# Patient Record
Sex: Female | Born: 1980
Health system: Southern US, Community
[De-identification: ages and names within clinical notes are randomized; demographics above are authoritative.]

## PROBLEM LIST (undated history)

## (undated) DIAGNOSIS — I10 Essential (primary) hypertension: Secondary | ICD-10-CM

## (undated) DIAGNOSIS — R0602 Shortness of breath: Secondary | ICD-10-CM

## (undated) DIAGNOSIS — K219 Gastro-esophageal reflux disease without esophagitis: Secondary | ICD-10-CM

## (undated) DIAGNOSIS — R51 Headache: Secondary | ICD-10-CM

## (undated) DIAGNOSIS — M1990 Unspecified osteoarthritis, unspecified site: Secondary | ICD-10-CM

## (undated) DIAGNOSIS — Z720 Tobacco use: Secondary | ICD-10-CM

## (undated) DIAGNOSIS — E109 Type 1 diabetes mellitus without complications: Secondary | ICD-10-CM

## (undated) DIAGNOSIS — J42 Unspecified chronic bronchitis: Secondary | ICD-10-CM

## (undated) DIAGNOSIS — E785 Hyperlipidemia, unspecified: Secondary | ICD-10-CM

## (undated) DIAGNOSIS — N289 Disorder of kidney and ureter, unspecified: Secondary | ICD-10-CM

## (undated) HISTORY — DX: Hyperlipidemia, unspecified: E78.5

## (undated) HISTORY — DX: Tobacco use: Z72.0

## (undated) HISTORY — DX: Imprisonment and other incarceration: Z65.1

## (undated) HISTORY — DX: Essential (primary) hypertension: I10

---
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
## 2000-01-14 ENCOUNTER — Emergency Department (HOSPITAL_COMMUNITY): Admission: EM | Admit: 2000-01-14 | Discharge: 2000-01-14 | Payer: Self-pay | Admitting: Emergency Medicine

## 2000-01-26 ENCOUNTER — Encounter: Admission: RE | Admit: 2000-01-26 | Discharge: 2000-01-26 | Payer: Self-pay | Admitting: Family Medicine

## 2000-02-09 ENCOUNTER — Emergency Department (HOSPITAL_COMMUNITY): Admission: EM | Admit: 2000-02-09 | Discharge: 2000-02-10 | Payer: Self-pay | Admitting: Emergency Medicine

## 2000-03-09 ENCOUNTER — Inpatient Hospital Stay (HOSPITAL_COMMUNITY): Admission: AD | Admit: 2000-03-09 | Discharge: 2000-03-09 | Payer: Self-pay | Admitting: Obstetrics & Gynecology

## 2000-03-12 ENCOUNTER — Encounter: Admission: RE | Admit: 2000-03-12 | Discharge: 2000-03-12 | Payer: Self-pay | Admitting: Family Medicine

## 2000-03-15 ENCOUNTER — Inpatient Hospital Stay (HOSPITAL_COMMUNITY): Admission: AD | Admit: 2000-03-15 | Discharge: 2000-03-19 | Payer: Self-pay | Admitting: Obstetrics & Gynecology

## 2000-03-17 ENCOUNTER — Encounter: Payer: Self-pay | Admitting: Obstetrics & Gynecology

## 2000-03-21 ENCOUNTER — Encounter: Admission: RE | Admit: 2000-03-21 | Discharge: 2000-03-21 | Payer: Self-pay | Admitting: Obstetrics & Gynecology

## 2000-03-21 ENCOUNTER — Encounter: Admission: RE | Admit: 2000-03-21 | Discharge: 2000-06-19 | Payer: Self-pay | Admitting: Obstetrics & Gynecology

## 2000-03-28 ENCOUNTER — Encounter: Admission: RE | Admit: 2000-03-28 | Discharge: 2000-03-28 | Payer: Self-pay | Admitting: Obstetrics & Gynecology

## 2000-04-04 ENCOUNTER — Ambulatory Visit (HOSPITAL_COMMUNITY): Admission: RE | Admit: 2000-04-04 | Discharge: 2000-04-04 | Payer: Self-pay | Admitting: Obstetrics & Gynecology

## 2000-04-04 ENCOUNTER — Encounter: Admission: RE | Admit: 2000-04-04 | Discharge: 2000-04-04 | Payer: Self-pay | Admitting: Obstetrics & Gynecology

## 2000-04-09 ENCOUNTER — Ambulatory Visit (HOSPITAL_COMMUNITY): Admission: RE | Admit: 2000-04-09 | Discharge: 2000-04-09 | Payer: Self-pay | Admitting: *Deleted

## 2000-04-25 ENCOUNTER — Encounter: Admission: RE | Admit: 2000-04-25 | Discharge: 2000-04-25 | Payer: Self-pay | Admitting: Obstetrics & Gynecology

## 2000-05-02 ENCOUNTER — Encounter: Admission: RE | Admit: 2000-05-02 | Discharge: 2000-05-02 | Payer: Self-pay | Admitting: Obstetrics & Gynecology

## 2000-05-05 ENCOUNTER — Inpatient Hospital Stay (HOSPITAL_COMMUNITY): Admission: AD | Admit: 2000-05-05 | Discharge: 2000-05-05 | Payer: Self-pay | Admitting: *Deleted

## 2000-05-09 ENCOUNTER — Encounter: Admission: RE | Admit: 2000-05-09 | Discharge: 2000-05-09 | Payer: Self-pay | Admitting: Obstetrics & Gynecology

## 2000-05-16 ENCOUNTER — Encounter: Admission: RE | Admit: 2000-05-16 | Discharge: 2000-05-16 | Payer: Self-pay | Admitting: Obstetrics & Gynecology

## 2000-05-17 ENCOUNTER — Ambulatory Visit (HOSPITAL_COMMUNITY): Admission: RE | Admit: 2000-05-17 | Discharge: 2000-05-17 | Payer: Self-pay | Admitting: Obstetrics & Gynecology

## 2000-05-30 ENCOUNTER — Encounter: Admission: RE | Admit: 2000-05-30 | Discharge: 2000-05-30 | Payer: Self-pay | Admitting: Obstetrics & Gynecology

## 2000-06-06 ENCOUNTER — Encounter: Admission: RE | Admit: 2000-06-06 | Discharge: 2000-06-06 | Payer: Self-pay | Admitting: Obstetrics

## 2000-06-07 ENCOUNTER — Inpatient Hospital Stay (HOSPITAL_COMMUNITY): Admission: AD | Admit: 2000-06-07 | Discharge: 2000-06-10 | Payer: Self-pay | Admitting: *Deleted

## 2000-06-19 ENCOUNTER — Ambulatory Visit (HOSPITAL_COMMUNITY): Admission: RE | Admit: 2000-06-19 | Discharge: 2000-06-19 | Payer: Self-pay | Admitting: *Deleted

## 2000-06-19 ENCOUNTER — Encounter: Payer: Self-pay | Admitting: *Deleted

## 2000-06-20 ENCOUNTER — Encounter: Admission: RE | Admit: 2000-06-20 | Discharge: 2000-06-20 | Payer: Self-pay | Admitting: Obstetrics & Gynecology

## 2000-06-27 ENCOUNTER — Encounter: Admission: RE | Admit: 2000-06-27 | Discharge: 2000-06-27 | Payer: Self-pay | Admitting: Obstetrics & Gynecology

## 2000-07-17 ENCOUNTER — Ambulatory Visit (HOSPITAL_COMMUNITY): Admission: RE | Admit: 2000-07-17 | Discharge: 2000-07-17 | Payer: Self-pay | Admitting: *Deleted

## 2000-07-17 ENCOUNTER — Encounter: Payer: Self-pay | Admitting: *Deleted

## 2000-07-18 ENCOUNTER — Encounter: Admission: RE | Admit: 2000-07-18 | Discharge: 2000-07-18 | Payer: Self-pay | Admitting: Obstetrics & Gynecology

## 2000-07-25 ENCOUNTER — Encounter: Admission: RE | Admit: 2000-07-25 | Discharge: 2000-07-25 | Payer: Self-pay | Admitting: Obstetrics & Gynecology

## 2000-07-31 ENCOUNTER — Ambulatory Visit (HOSPITAL_COMMUNITY): Admission: RE | Admit: 2000-07-31 | Discharge: 2000-07-31 | Payer: Self-pay | Admitting: Obstetrics & Gynecology

## 2000-08-07 ENCOUNTER — Ambulatory Visit (HOSPITAL_COMMUNITY): Admission: RE | Admit: 2000-08-07 | Discharge: 2000-08-07 | Payer: Self-pay | Admitting: Obstetrics & Gynecology

## 2000-08-08 ENCOUNTER — Encounter: Admission: RE | Admit: 2000-08-08 | Discharge: 2000-08-08 | Payer: Self-pay | Admitting: Obstetrics & Gynecology

## 2000-08-08 ENCOUNTER — Encounter: Admission: RE | Admit: 2000-08-08 | Discharge: 2000-08-25 | Payer: Self-pay | Admitting: Obstetrics & Gynecology

## 2000-08-22 ENCOUNTER — Encounter: Admission: RE | Admit: 2000-08-22 | Discharge: 2000-08-22 | Payer: Self-pay | Admitting: Obstetrics & Gynecology

## 2000-08-22 ENCOUNTER — Inpatient Hospital Stay (HOSPITAL_COMMUNITY): Admission: AD | Admit: 2000-08-22 | Discharge: 2000-08-31 | Payer: Self-pay | Admitting: Obstetrics

## 2000-08-22 ENCOUNTER — Encounter (INDEPENDENT_AMBULATORY_CARE_PROVIDER_SITE_OTHER): Payer: Self-pay | Admitting: Specialist

## 2000-09-04 ENCOUNTER — Encounter: Admission: RE | Admit: 2000-09-04 | Discharge: 2000-09-04 | Payer: Self-pay | Admitting: Family Medicine

## 2000-09-11 ENCOUNTER — Encounter: Admission: RE | Admit: 2000-09-11 | Discharge: 2000-09-11 | Payer: Self-pay | Admitting: Family Medicine

## 2000-09-25 ENCOUNTER — Emergency Department (HOSPITAL_COMMUNITY): Admission: EM | Admit: 2000-09-25 | Discharge: 2000-09-25 | Payer: Self-pay

## 2000-09-25 ENCOUNTER — Encounter: Payer: Self-pay | Admitting: *Deleted

## 2000-09-27 ENCOUNTER — Emergency Department (HOSPITAL_COMMUNITY): Admission: EM | Admit: 2000-09-27 | Discharge: 2000-09-27 | Payer: Self-pay

## 2000-10-11 ENCOUNTER — Encounter: Admission: RE | Admit: 2000-10-11 | Discharge: 2000-10-11 | Payer: Self-pay | Admitting: Family Medicine

## 2000-10-18 ENCOUNTER — Encounter: Admission: RE | Admit: 2000-10-18 | Discharge: 2000-10-18 | Payer: Self-pay | Admitting: Family Medicine

## 2000-12-05 ENCOUNTER — Encounter: Admission: RE | Admit: 2000-12-05 | Discharge: 2000-12-05 | Payer: Self-pay | Admitting: Family Medicine

## 2000-12-17 ENCOUNTER — Encounter: Admission: RE | Admit: 2000-12-17 | Discharge: 2000-12-17 | Payer: Self-pay | Admitting: Family Medicine

## 2001-01-09 ENCOUNTER — Encounter: Admission: RE | Admit: 2001-01-09 | Discharge: 2001-01-09 | Payer: Self-pay | Admitting: Family Medicine

## 2001-03-27 ENCOUNTER — Encounter: Admission: RE | Admit: 2001-03-27 | Discharge: 2001-03-27 | Payer: Self-pay | Admitting: Family Medicine

## 2001-04-02 ENCOUNTER — Inpatient Hospital Stay (HOSPITAL_COMMUNITY): Admission: AD | Admit: 2001-04-02 | Discharge: 2001-04-02 | Payer: Self-pay | Admitting: *Deleted

## 2001-04-12 ENCOUNTER — Encounter: Admission: RE | Admit: 2001-04-12 | Discharge: 2001-04-12 | Payer: Self-pay | Admitting: Internal Medicine

## 2001-04-15 ENCOUNTER — Encounter: Admission: RE | Admit: 2001-04-15 | Discharge: 2001-04-15 | Payer: Self-pay | Admitting: Internal Medicine

## 2001-04-16 ENCOUNTER — Encounter: Admission: RE | Admit: 2001-04-16 | Discharge: 2001-04-16 | Payer: Self-pay | Admitting: Internal Medicine

## 2001-05-14 ENCOUNTER — Encounter: Admission: RE | Admit: 2001-05-14 | Discharge: 2001-05-14 | Payer: Self-pay

## 2001-07-10 ENCOUNTER — Encounter: Admission: RE | Admit: 2001-07-10 | Discharge: 2001-07-10 | Payer: Self-pay | Admitting: Family Medicine

## 2001-08-05 ENCOUNTER — Encounter: Admission: RE | Admit: 2001-08-05 | Discharge: 2001-08-05 | Payer: Self-pay | Admitting: Family Medicine

## 2001-10-03 ENCOUNTER — Emergency Department (HOSPITAL_COMMUNITY): Admission: EM | Admit: 2001-10-03 | Discharge: 2001-10-03 | Payer: Self-pay | Admitting: Emergency Medicine

## 2001-10-10 ENCOUNTER — Encounter: Admission: RE | Admit: 2001-10-10 | Discharge: 2001-10-10 | Payer: Self-pay | Admitting: Family Medicine

## 2001-11-05 ENCOUNTER — Encounter: Admission: RE | Admit: 2001-11-05 | Discharge: 2001-11-05 | Payer: Self-pay | Admitting: Family Medicine

## 2001-12-06 ENCOUNTER — Encounter: Admission: RE | Admit: 2001-12-06 | Discharge: 2001-12-06 | Payer: Self-pay | Admitting: Family Medicine

## 2002-01-08 ENCOUNTER — Encounter: Admission: RE | Admit: 2002-01-08 | Discharge: 2002-01-08 | Payer: Self-pay | Admitting: Family Medicine

## 2002-02-27 ENCOUNTER — Encounter: Admission: RE | Admit: 2002-02-27 | Discharge: 2002-02-27 | Payer: Self-pay | Admitting: Family Medicine

## 2002-04-18 ENCOUNTER — Other Ambulatory Visit: Admission: RE | Admit: 2002-04-18 | Discharge: 2002-04-18 | Payer: Self-pay | Admitting: Family Medicine

## 2002-04-18 ENCOUNTER — Encounter: Admission: RE | Admit: 2002-04-18 | Discharge: 2002-04-18 | Payer: Self-pay | Admitting: Family Medicine

## 2002-07-24 ENCOUNTER — Encounter: Admission: RE | Admit: 2002-07-24 | Discharge: 2002-07-24 | Payer: Self-pay | Admitting: Family Medicine

## 2002-08-25 ENCOUNTER — Encounter: Admission: RE | Admit: 2002-08-25 | Discharge: 2002-08-25 | Payer: Self-pay | Admitting: Sports Medicine

## 2002-09-12 ENCOUNTER — Encounter: Admission: RE | Admit: 2002-09-12 | Discharge: 2002-09-12 | Payer: Self-pay | Admitting: Family Medicine

## 2002-10-23 ENCOUNTER — Encounter: Admission: RE | Admit: 2002-10-23 | Discharge: 2002-10-23 | Payer: Self-pay | Admitting: Family Medicine

## 2002-11-07 ENCOUNTER — Encounter: Admission: RE | Admit: 2002-11-07 | Discharge: 2002-11-07 | Payer: Self-pay | Admitting: Family Medicine

## 2002-12-19 ENCOUNTER — Encounter: Admission: RE | Admit: 2002-12-19 | Discharge: 2002-12-19 | Payer: Self-pay | Admitting: Family Medicine

## 2002-12-24 ENCOUNTER — Encounter: Admission: RE | Admit: 2002-12-24 | Discharge: 2002-12-24 | Payer: Self-pay | Admitting: Family Medicine

## 2003-01-08 ENCOUNTER — Encounter: Admission: RE | Admit: 2003-01-08 | Discharge: 2003-01-08 | Payer: Self-pay | Admitting: *Deleted

## 2003-01-15 ENCOUNTER — Encounter: Admission: RE | Admit: 2003-01-15 | Discharge: 2003-01-15 | Payer: Self-pay | Admitting: *Deleted

## 2003-01-15 ENCOUNTER — Ambulatory Visit (HOSPITAL_COMMUNITY): Admission: RE | Admit: 2003-01-15 | Discharge: 2003-01-15 | Payer: Self-pay | Admitting: *Deleted

## 2003-01-29 ENCOUNTER — Encounter: Admission: RE | Admit: 2003-01-29 | Discharge: 2003-01-29 | Payer: Self-pay | Admitting: Family Medicine

## 2003-02-12 ENCOUNTER — Encounter: Admission: RE | Admit: 2003-02-12 | Discharge: 2003-02-12 | Payer: Self-pay | Admitting: *Deleted

## 2003-02-14 ENCOUNTER — Emergency Department (HOSPITAL_COMMUNITY): Admission: AD | Admit: 2003-02-14 | Discharge: 2003-02-14 | Payer: Self-pay | Admitting: Family Medicine

## 2003-02-17 ENCOUNTER — Ambulatory Visit (HOSPITAL_COMMUNITY): Admission: RE | Admit: 2003-02-17 | Discharge: 2003-02-17 | Payer: Self-pay | Admitting: Family Medicine

## 2003-03-19 ENCOUNTER — Encounter: Admission: RE | Admit: 2003-03-19 | Discharge: 2003-03-19 | Payer: Self-pay | Admitting: Family Medicine

## 2003-04-08 ENCOUNTER — Encounter: Admission: RE | Admit: 2003-04-08 | Discharge: 2003-04-08 | Payer: Self-pay | Admitting: *Deleted

## 2003-04-15 ENCOUNTER — Emergency Department (HOSPITAL_COMMUNITY): Admission: EM | Admit: 2003-04-15 | Discharge: 2003-04-15 | Payer: Self-pay | Admitting: Emergency Medicine

## 2003-04-17 ENCOUNTER — Inpatient Hospital Stay (HOSPITAL_COMMUNITY): Admission: AD | Admit: 2003-04-17 | Discharge: 2003-04-18 | Payer: Self-pay | Admitting: Obstetrics & Gynecology

## 2003-04-29 ENCOUNTER — Encounter: Admission: RE | Admit: 2003-04-29 | Discharge: 2003-04-29 | Payer: Self-pay | Admitting: *Deleted

## 2003-05-21 ENCOUNTER — Encounter: Admission: RE | Admit: 2003-05-21 | Discharge: 2003-05-21 | Payer: Self-pay | Admitting: *Deleted

## 2003-05-26 ENCOUNTER — Encounter: Admission: RE | Admit: 2003-05-26 | Discharge: 2003-05-26 | Payer: Self-pay | Admitting: *Deleted

## 2003-05-28 ENCOUNTER — Encounter: Admission: RE | Admit: 2003-05-28 | Discharge: 2003-05-28 | Payer: Self-pay | Admitting: *Deleted

## 2003-05-28 ENCOUNTER — Ambulatory Visit (HOSPITAL_COMMUNITY): Admission: RE | Admit: 2003-05-28 | Discharge: 2003-05-28 | Payer: Self-pay | Admitting: *Deleted

## 2003-06-04 ENCOUNTER — Ambulatory Visit (HOSPITAL_COMMUNITY): Admission: RE | Admit: 2003-06-04 | Discharge: 2003-06-04 | Payer: Self-pay | Admitting: *Deleted

## 2003-06-04 ENCOUNTER — Encounter: Admission: RE | Admit: 2003-06-04 | Discharge: 2003-06-04 | Payer: Self-pay | Admitting: Family Medicine

## 2003-06-11 ENCOUNTER — Encounter: Admission: RE | Admit: 2003-06-11 | Discharge: 2003-06-11 | Payer: Self-pay | Admitting: Family Medicine

## 2003-06-25 ENCOUNTER — Encounter: Admission: RE | Admit: 2003-06-25 | Discharge: 2003-06-25 | Payer: Self-pay | Admitting: *Deleted

## 2003-06-25 ENCOUNTER — Inpatient Hospital Stay (HOSPITAL_COMMUNITY): Admission: AD | Admit: 2003-06-25 | Discharge: 2003-06-25 | Payer: Self-pay | Admitting: Obstetrics & Gynecology

## 2003-06-26 ENCOUNTER — Inpatient Hospital Stay (HOSPITAL_COMMUNITY): Admission: AD | Admit: 2003-06-26 | Discharge: 2003-06-26 | Payer: Self-pay | Admitting: Family Medicine

## 2003-06-28 ENCOUNTER — Encounter (INDEPENDENT_AMBULATORY_CARE_PROVIDER_SITE_OTHER): Payer: Self-pay | Admitting: Specialist

## 2003-06-28 ENCOUNTER — Inpatient Hospital Stay (HOSPITAL_COMMUNITY): Admission: AD | Admit: 2003-06-28 | Discharge: 2003-07-01 | Payer: Self-pay | Admitting: Family Medicine

## 2003-07-05 ENCOUNTER — Inpatient Hospital Stay (HOSPITAL_COMMUNITY): Admission: AD | Admit: 2003-07-05 | Discharge: 2003-07-05 | Payer: Self-pay | Admitting: *Deleted

## 2003-07-21 ENCOUNTER — Encounter: Admission: RE | Admit: 2003-07-21 | Discharge: 2003-07-21 | Payer: Self-pay | Admitting: Family Medicine

## 2003-08-13 ENCOUNTER — Encounter: Admission: RE | Admit: 2003-08-13 | Discharge: 2003-08-13 | Payer: Self-pay | Admitting: Obstetrics and Gynecology

## 2003-09-26 ENCOUNTER — Emergency Department (HOSPITAL_COMMUNITY): Admission: EM | Admit: 2003-09-26 | Discharge: 2003-09-26 | Payer: Self-pay | Admitting: Emergency Medicine

## 2004-03-07 ENCOUNTER — Other Ambulatory Visit: Admission: RE | Admit: 2004-03-07 | Discharge: 2004-03-07 | Payer: Self-pay | Admitting: Family Medicine

## 2004-03-07 ENCOUNTER — Ambulatory Visit: Payer: Self-pay | Admitting: Family Medicine

## 2004-03-15 ENCOUNTER — Encounter: Admission: RE | Admit: 2004-03-15 | Discharge: 2004-03-15 | Payer: Self-pay | Admitting: *Deleted

## 2004-03-31 ENCOUNTER — Ambulatory Visit: Payer: Self-pay | Admitting: Family Medicine

## 2004-04-28 ENCOUNTER — Ambulatory Visit: Payer: Self-pay | Admitting: Family Medicine

## 2004-06-09 ENCOUNTER — Ambulatory Visit: Payer: Self-pay | Admitting: Family Medicine

## 2004-07-05 ENCOUNTER — Ambulatory Visit: Payer: Self-pay | Admitting: Family Medicine

## 2004-07-20 ENCOUNTER — Ambulatory Visit: Payer: Self-pay | Admitting: Family Medicine

## 2004-08-25 ENCOUNTER — Ambulatory Visit: Payer: Self-pay | Admitting: Family Medicine

## 2004-09-28 ENCOUNTER — Ambulatory Visit: Payer: Self-pay | Admitting: Family Medicine

## 2004-10-07 ENCOUNTER — Ambulatory Visit: Payer: Self-pay | Admitting: Family Medicine

## 2004-10-28 ENCOUNTER — Ambulatory Visit: Payer: Self-pay | Admitting: Family Medicine

## 2005-01-17 ENCOUNTER — Ambulatory Visit: Payer: Self-pay | Admitting: Family Medicine

## 2005-01-17 ENCOUNTER — Inpatient Hospital Stay (HOSPITAL_COMMUNITY): Admission: EM | Admit: 2005-01-17 | Discharge: 2005-01-19 | Payer: Self-pay | Admitting: Family Medicine

## 2005-01-17 ENCOUNTER — Emergency Department (HOSPITAL_COMMUNITY): Admission: EM | Admit: 2005-01-17 | Discharge: 2005-01-17 | Payer: Self-pay | Admitting: Emergency Medicine

## 2005-02-13 ENCOUNTER — Ambulatory Visit: Payer: Self-pay | Admitting: Sports Medicine

## 2005-03-23 ENCOUNTER — Ambulatory Visit: Payer: Self-pay | Admitting: Family Medicine

## 2005-04-30 ENCOUNTER — Encounter (INDEPENDENT_AMBULATORY_CARE_PROVIDER_SITE_OTHER): Payer: Self-pay | Admitting: *Deleted

## 2005-04-30 LAB — CONVERTED CEMR LAB

## 2005-05-22 ENCOUNTER — Ambulatory Visit: Payer: Self-pay | Admitting: Family Medicine

## 2005-08-07 ENCOUNTER — Inpatient Hospital Stay (HOSPITAL_COMMUNITY): Admission: EM | Admit: 2005-08-07 | Discharge: 2005-08-10 | Payer: Self-pay | Admitting: *Deleted

## 2005-08-07 ENCOUNTER — Ambulatory Visit: Payer: Self-pay | Admitting: Family Medicine

## 2005-12-05 ENCOUNTER — Ambulatory Visit: Payer: Self-pay | Admitting: Family Medicine

## 2006-03-29 DIAGNOSIS — E7800 Pure hypercholesterolemia, unspecified: Secondary | ICD-10-CM | POA: Insufficient documentation

## 2006-03-30 ENCOUNTER — Encounter (INDEPENDENT_AMBULATORY_CARE_PROVIDER_SITE_OTHER): Payer: Self-pay | Admitting: *Deleted

## 2006-04-02 ENCOUNTER — Encounter: Payer: Self-pay | Admitting: Family Medicine

## 2006-04-02 ENCOUNTER — Ambulatory Visit: Payer: Self-pay | Admitting: Sports Medicine

## 2006-04-02 DIAGNOSIS — E103599 Type 1 diabetes mellitus with proliferative diabetic retinopathy without macular edema, unspecified eye: Secondary | ICD-10-CM | POA: Insufficient documentation

## 2006-04-02 LAB — CONVERTED CEMR LAB
ALT: 19 units/L (ref 0–35)
AST: 20 units/L (ref 0–37)
Albumin: 3.9 g/dL (ref 3.5–5.2)
Alkaline Phosphatase: 94 units/L (ref 39–117)
BUN: 9 mg/dL (ref 6–23)
CO2: 25 meq/L (ref 19–32)
Calcium: 9.2 mg/dL (ref 8.4–10.5)
Chloride: 90 meq/L — ABNORMAL LOW (ref 96–112)
Creatinine, Ser: 0.84 mg/dL (ref 0.40–1.20)
Glucose, Bld: 536 mg/dL (ref 70–99)
Potassium: 4.1 meq/L (ref 3.5–5.3)
Sodium: 127 meq/L — ABNORMAL LOW (ref 135–145)
Total Bilirubin: 0.5 mg/dL (ref 0.3–1.2)
Total Protein: 7.4 g/dL (ref 6.0–8.3)

## 2006-04-03 ENCOUNTER — Telehealth: Payer: Self-pay | Admitting: Family Medicine

## 2006-04-06 ENCOUNTER — Inpatient Hospital Stay (HOSPITAL_COMMUNITY): Admission: EM | Admit: 2006-04-06 | Discharge: 2006-04-08 | Payer: Self-pay | Admitting: Emergency Medicine

## 2006-04-06 ENCOUNTER — Ambulatory Visit: Payer: Self-pay | Admitting: Internal Medicine

## 2006-06-04 ENCOUNTER — Ambulatory Visit: Payer: Self-pay | Admitting: Family Medicine

## 2006-06-18 ENCOUNTER — Ambulatory Visit: Payer: Self-pay | Admitting: Sports Medicine

## 2006-06-20 ENCOUNTER — Ambulatory Visit: Payer: Self-pay | Admitting: *Deleted

## 2006-09-06 ENCOUNTER — Encounter (INDEPENDENT_AMBULATORY_CARE_PROVIDER_SITE_OTHER): Payer: Self-pay | Admitting: Family Medicine

## 2006-09-18 ENCOUNTER — Telehealth: Payer: Self-pay | Admitting: *Deleted

## 2006-09-25 ENCOUNTER — Encounter: Payer: Self-pay | Admitting: *Deleted

## 2006-09-25 ENCOUNTER — Telehealth (INDEPENDENT_AMBULATORY_CARE_PROVIDER_SITE_OTHER): Payer: Self-pay | Admitting: *Deleted

## 2006-10-18 ENCOUNTER — Other Ambulatory Visit: Admission: RE | Admit: 2006-10-18 | Discharge: 2006-10-18 | Payer: Self-pay | Admitting: Family Medicine

## 2006-10-18 ENCOUNTER — Encounter (INDEPENDENT_AMBULATORY_CARE_PROVIDER_SITE_OTHER): Payer: Self-pay | Admitting: Family Medicine

## 2006-10-18 ENCOUNTER — Ambulatory Visit: Payer: Self-pay | Admitting: Family Medicine

## 2006-10-18 LAB — CONVERTED CEMR LAB
Hgb A1c MFr Bld: >14 %
KOH Prep: NEGATIVE
Whiff Test: POSITIVE

## 2006-11-05 ENCOUNTER — Encounter (INDEPENDENT_AMBULATORY_CARE_PROVIDER_SITE_OTHER): Payer: Self-pay | Admitting: Family Medicine

## 2006-11-05 ENCOUNTER — Encounter: Payer: Self-pay | Admitting: *Deleted

## 2006-11-16 ENCOUNTER — Inpatient Hospital Stay (HOSPITAL_COMMUNITY): Admission: AD | Admit: 2006-11-16 | Discharge: 2006-11-16 | Payer: Self-pay | Admitting: Obstetrics and Gynecology

## 2006-11-19 ENCOUNTER — Encounter (INDEPENDENT_AMBULATORY_CARE_PROVIDER_SITE_OTHER): Payer: Self-pay | Admitting: *Deleted

## 2007-01-27 ENCOUNTER — Emergency Department (HOSPITAL_COMMUNITY): Admission: EM | Admit: 2007-01-27 | Discharge: 2007-01-27 | Payer: Self-pay | Admitting: Emergency Medicine

## 2007-01-31 HISTORY — PX: TUBAL LIGATION: SHX77

## 2007-02-27 ENCOUNTER — Telehealth: Payer: Self-pay | Admitting: *Deleted

## 2007-02-28 ENCOUNTER — Encounter: Payer: Self-pay | Admitting: *Deleted

## 2007-05-07 ENCOUNTER — Ambulatory Visit: Payer: Self-pay | Admitting: Family Medicine

## 2007-05-07 ENCOUNTER — Encounter (INDEPENDENT_AMBULATORY_CARE_PROVIDER_SITE_OTHER): Payer: Self-pay | Admitting: *Deleted

## 2007-05-07 LAB — CONVERTED CEMR LAB
Beta hcg, urine, semiquantitative: NEGATIVE
Bilirubin Urine: NEGATIVE
Blood in Urine, dipstick: NEGATIVE
Glucose, Urine, Semiquant: >=1000
Ketones, urine, test strip: NEGATIVE
Nitrite: NEGATIVE
Protein, U semiquant: NEGATIVE
Specific Gravity, Urine: <1.005
Urobilinogen, UA: 0.2
WBC Urine, dipstick: NEGATIVE
Whiff Test: POSITIVE
pH: 6

## 2007-05-08 ENCOUNTER — Ambulatory Visit: Payer: Self-pay | Admitting: Family Medicine

## 2007-05-08 LAB — CONVERTED CEMR LAB
Chlamydia, DNA Probe: NEGATIVE
GC Probe Amp, Genital: POSITIVE — AB

## 2007-05-28 ENCOUNTER — Encounter: Payer: Self-pay | Admitting: *Deleted

## 2007-05-28 ENCOUNTER — Encounter (INDEPENDENT_AMBULATORY_CARE_PROVIDER_SITE_OTHER): Payer: Self-pay | Admitting: Family Medicine

## 2007-05-28 ENCOUNTER — Ambulatory Visit: Payer: Self-pay | Admitting: Family Medicine

## 2007-05-28 LAB — CONVERTED CEMR LAB: Whiff Test: POSITIVE

## 2007-05-30 ENCOUNTER — Encounter: Payer: Self-pay | Admitting: *Deleted

## 2007-05-30 LAB — CONVERTED CEMR LAB: Chlamydia, DNA Probe: NEGATIVE

## 2007-06-17 ENCOUNTER — Encounter (INDEPENDENT_AMBULATORY_CARE_PROVIDER_SITE_OTHER): Payer: Self-pay | Admitting: Family Medicine

## 2007-06-17 ENCOUNTER — Ambulatory Visit: Payer: Self-pay | Admitting: Family Medicine

## 2007-06-17 LAB — CONVERTED CEMR LAB
Bilirubin Urine: NEGATIVE
Glucose, Urine, Semiquant: >=1000
Urobilinogen, UA: 0.2
pH: 6

## 2007-06-20 ENCOUNTER — Emergency Department (HOSPITAL_COMMUNITY): Admission: EM | Admit: 2007-06-20 | Discharge: 2007-06-20 | Payer: Self-pay | Admitting: Emergency Medicine

## 2007-07-02 ENCOUNTER — Telehealth: Payer: Self-pay | Admitting: *Deleted

## 2007-07-04 ENCOUNTER — Encounter (INDEPENDENT_AMBULATORY_CARE_PROVIDER_SITE_OTHER): Payer: Self-pay | Admitting: Family Medicine

## 2007-07-04 ENCOUNTER — Ambulatory Visit: Payer: Self-pay | Admitting: Family Medicine

## 2007-07-04 LAB — CONVERTED CEMR LAB
CO2: 25 meq/L (ref 19–32)
Calcium: 8.9 mg/dL (ref 8.4–10.5)
Chloride: 94 meq/L — ABNORMAL LOW (ref 96–112)
Creatinine, Ser: 0.86 mg/dL (ref 0.40–1.20)
Eosinophils Relative: 0 % (ref 0–5)
Glucose, Bld: 385 mg/dL — ABNORMAL HIGH (ref 70–99)
Glucose, Urine, Semiquant: >=1000
HCT: 38.2 % (ref 36.0–46.0)
Hemoglobin: 12.8 g/dL (ref 12.0–15.0)
Lymphocytes Relative: 17 % (ref 12–46)
Lymphs Abs: 1.4 10*3/uL (ref 0.7–4.0)
Monocytes Absolute: 1 10*3/uL (ref 0.1–1.0)
Neutro Abs: 5.7 10*3/uL (ref 1.7–7.7)
Nitrite: NEGATIVE
Protein, U semiquant: 30
RBC: 4.28 M/uL (ref 3.87–5.11)
Urobilinogen, UA: 0.2
WBC: 8.1 10*3/uL (ref 4.0–10.5)
pH: 6.5

## 2007-07-12 ENCOUNTER — Telehealth (INDEPENDENT_AMBULATORY_CARE_PROVIDER_SITE_OTHER): Payer: Self-pay | Admitting: Family Medicine

## 2007-07-12 ENCOUNTER — Encounter: Admission: RE | Admit: 2007-07-12 | Discharge: 2007-07-12 | Payer: Self-pay | Admitting: Family Medicine

## 2007-07-16 ENCOUNTER — Encounter (INDEPENDENT_AMBULATORY_CARE_PROVIDER_SITE_OTHER): Payer: Self-pay | Admitting: *Deleted

## 2007-07-16 ENCOUNTER — Telehealth (INDEPENDENT_AMBULATORY_CARE_PROVIDER_SITE_OTHER): Payer: Self-pay | Admitting: *Deleted

## 2007-07-16 ENCOUNTER — Encounter (INDEPENDENT_AMBULATORY_CARE_PROVIDER_SITE_OTHER): Payer: Self-pay | Admitting: Family Medicine

## 2007-07-17 ENCOUNTER — Encounter (INDEPENDENT_AMBULATORY_CARE_PROVIDER_SITE_OTHER): Payer: Self-pay | Admitting: Family Medicine

## 2007-07-17 ENCOUNTER — Ambulatory Visit: Payer: Self-pay | Admitting: Family Medicine

## 2007-07-17 ENCOUNTER — Telehealth (INDEPENDENT_AMBULATORY_CARE_PROVIDER_SITE_OTHER): Payer: Self-pay | Admitting: *Deleted

## 2007-07-17 ENCOUNTER — Ambulatory Visit (HOSPITAL_COMMUNITY): Admission: RE | Admit: 2007-07-17 | Discharge: 2007-07-17 | Payer: Self-pay | Admitting: Family Medicine

## 2007-07-18 LAB — CONVERTED CEMR LAB
Basophils Relative: 1 % (ref 0–1)
Eosinophils Absolute: 0.1 10*3/uL (ref 0.0–0.7)
Eosinophils Relative: 3 % (ref 0–5)
HCT: 39.3 % (ref 36.0–46.0)
Lymphs Abs: 2.2 10*3/uL (ref 0.7–4.0)
MCHC: 33.1 g/dL (ref 30.0–36.0)
MCV: 88.7 fL (ref 78.0–100.0)
Monocytes Relative: 6 % (ref 3–12)
Neutrophils Relative %: 48 % (ref 43–77)
Platelets: 496 10*3/uL — ABNORMAL HIGH (ref 150–400)
RBC: 4.43 M/uL (ref 3.87–5.11)
WBC: 5.1 10*3/uL (ref 4.0–10.5)

## 2007-07-19 ENCOUNTER — Encounter (INDEPENDENT_AMBULATORY_CARE_PROVIDER_SITE_OTHER): Payer: Self-pay | Admitting: Family Medicine

## 2007-08-01 ENCOUNTER — Encounter: Payer: Self-pay | Admitting: *Deleted

## 2007-09-25 ENCOUNTER — Encounter: Payer: Self-pay | Admitting: *Deleted

## 2007-09-26 ENCOUNTER — Ambulatory Visit: Payer: Self-pay | Admitting: Family Medicine

## 2007-09-26 ENCOUNTER — Telehealth: Payer: Self-pay | Admitting: *Deleted

## 2007-09-27 ENCOUNTER — Encounter: Payer: Self-pay | Admitting: Family Medicine

## 2007-11-18 ENCOUNTER — Ambulatory Visit: Payer: Self-pay | Admitting: Family Medicine

## 2007-11-18 ENCOUNTER — Encounter (INDEPENDENT_AMBULATORY_CARE_PROVIDER_SITE_OTHER): Payer: Self-pay | Admitting: Family Medicine

## 2007-11-18 ENCOUNTER — Telehealth (INDEPENDENT_AMBULATORY_CARE_PROVIDER_SITE_OTHER): Payer: Self-pay | Admitting: *Deleted

## 2007-11-18 LAB — CONVERTED CEMR LAB
Blood in Urine, dipstick: NEGATIVE
Chlamydia, DNA Probe: NEGATIVE
GC Probe Amp, Genital: NEGATIVE
Glucose, Urine, Semiquant: 500
Nitrite: NEGATIVE
Protein, U semiquant: NEGATIVE
WBC Urine, dipstick: NEGATIVE
Whiff Test: POSITIVE
pH: 6

## 2007-11-19 ENCOUNTER — Encounter (INDEPENDENT_AMBULATORY_CARE_PROVIDER_SITE_OTHER): Payer: Self-pay | Admitting: Family Medicine

## 2008-01-20 ENCOUNTER — Encounter (INDEPENDENT_AMBULATORY_CARE_PROVIDER_SITE_OTHER): Payer: Self-pay | Admitting: Family Medicine

## 2008-01-20 ENCOUNTER — Ambulatory Visit: Payer: Self-pay | Admitting: Family Medicine

## 2008-01-20 ENCOUNTER — Other Ambulatory Visit: Admission: RE | Admit: 2008-01-20 | Discharge: 2008-01-20 | Payer: Self-pay | Admitting: Family Medicine

## 2008-01-20 LAB — CONVERTED CEMR LAB

## 2008-01-21 LAB — CONVERTED CEMR LAB
Albumin: 5 g/dL (ref 3.5–5.2)
Alkaline Phosphatase: 80 units/L (ref 39–117)
BUN: 9 mg/dL (ref 6–23)
CO2: 22 meq/L (ref 19–32)
Calcium: 10.3 mg/dL (ref 8.4–10.5)
Chloride: 101 meq/L (ref 96–112)
Cholesterol: 288 mg/dL — ABNORMAL HIGH (ref 0–200)
Glucose, Bld: 117 mg/dL — ABNORMAL HIGH (ref 70–99)
HDL: 105 mg/dL (ref >39)
LDL Cholesterol: 163 mg/dL — ABNORMAL HIGH (ref 0–99)
Potassium: 3.8 meq/L (ref 3.5–5.3)
Sodium: 143 meq/L (ref 135–145)
Total Protein: 9.2 g/dL — ABNORMAL HIGH (ref 6.0–8.3)
Triglycerides: 102 mg/dL (ref <150)

## 2008-02-26 ENCOUNTER — Telehealth: Payer: Self-pay | Admitting: *Deleted

## 2008-05-04 ENCOUNTER — Ambulatory Visit: Payer: Self-pay | Admitting: Family Medicine

## 2008-05-04 ENCOUNTER — Encounter (INDEPENDENT_AMBULATORY_CARE_PROVIDER_SITE_OTHER): Payer: Self-pay | Admitting: Family Medicine

## 2008-05-04 LAB — CONVERTED CEMR LAB
Beta hcg, urine, semiquantitative: NEGATIVE
Chlamydia, DNA Probe: NEGATIVE
Hgb A1c MFr Bld: 13.4 %
Whiff Test: NEGATIVE

## 2008-05-06 ENCOUNTER — Encounter (INDEPENDENT_AMBULATORY_CARE_PROVIDER_SITE_OTHER): Payer: Self-pay | Admitting: Family Medicine

## 2008-07-13 ENCOUNTER — Encounter (INDEPENDENT_AMBULATORY_CARE_PROVIDER_SITE_OTHER): Payer: Self-pay | Admitting: Family Medicine

## 2008-11-07 ENCOUNTER — Encounter (INDEPENDENT_AMBULATORY_CARE_PROVIDER_SITE_OTHER): Payer: Self-pay | Admitting: *Deleted

## 2008-11-07 DIAGNOSIS — F17200 Nicotine dependence, unspecified, uncomplicated: Secondary | ICD-10-CM

## 2008-11-07 DIAGNOSIS — Z720 Tobacco use: Secondary | ICD-10-CM | POA: Insufficient documentation

## 2009-01-07 ENCOUNTER — Emergency Department (HOSPITAL_COMMUNITY): Admission: EM | Admit: 2009-01-07 | Discharge: 2009-01-08 | Payer: Self-pay | Admitting: Emergency Medicine

## 2009-01-08 ENCOUNTER — Ambulatory Visit: Payer: Self-pay | Admitting: Family Medicine

## 2009-01-08 ENCOUNTER — Encounter: Payer: Self-pay | Admitting: Family Medicine

## 2009-01-08 ENCOUNTER — Inpatient Hospital Stay (HOSPITAL_COMMUNITY): Admission: EM | Admit: 2009-01-08 | Discharge: 2009-01-17 | Payer: Self-pay | Admitting: Emergency Medicine

## 2009-01-19 ENCOUNTER — Encounter: Payer: Self-pay | Admitting: Family Medicine

## 2009-01-26 ENCOUNTER — Telehealth: Payer: Self-pay | Admitting: Family Medicine

## 2009-02-05 ENCOUNTER — Telehealth: Payer: Self-pay | Admitting: Family Medicine

## 2009-02-08 ENCOUNTER — Encounter: Payer: Self-pay | Admitting: Family Medicine

## 2009-02-25 ENCOUNTER — Ambulatory Visit: Payer: Self-pay | Admitting: Family Medicine

## 2009-03-03 ENCOUNTER — Encounter: Payer: Self-pay | Admitting: Family Medicine

## 2009-03-03 ENCOUNTER — Ambulatory Visit: Payer: Self-pay | Admitting: Family Medicine

## 2009-03-03 LAB — CONVERTED CEMR LAB
Beta hcg, urine, semiquantitative: NEGATIVE
Bilirubin Urine: NEGATIVE
Chlamydia, DNA Probe: NEGATIVE
Epithelial cells, urine: <5 /lpf
GC Probe Amp, Genital: NEGATIVE
Glucose, Urine, Semiquant: 500
Hgb A1c MFr Bld: 11.9 %
Ketones, urine, test strip: NEGATIVE
Nitrite: NEGATIVE
Protein, U semiquant: NEGATIVE
Specific Gravity, Urine: <1.005
Urobilinogen, UA: 0.2
Whiff Test: POSITIVE
pH: 6.5

## 2009-03-04 ENCOUNTER — Telehealth: Payer: Self-pay | Admitting: *Deleted

## 2009-03-08 ENCOUNTER — Encounter: Payer: Self-pay | Admitting: *Deleted

## 2009-05-24 ENCOUNTER — Encounter: Payer: Self-pay | Admitting: Family Medicine

## 2009-05-24 ENCOUNTER — Telehealth: Payer: Self-pay | Admitting: Family Medicine

## 2009-05-24 ENCOUNTER — Ambulatory Visit: Payer: Self-pay | Admitting: Family Medicine

## 2009-05-24 DIAGNOSIS — M6530 Trigger finger, unspecified finger: Secondary | ICD-10-CM | POA: Insufficient documentation

## 2009-05-24 LAB — CONVERTED CEMR LAB

## 2009-07-08 ENCOUNTER — Telehealth: Payer: Self-pay | Admitting: Family Medicine

## 2009-07-12 ENCOUNTER — Telehealth: Payer: Self-pay | Admitting: Family Medicine

## 2009-07-13 ENCOUNTER — Ambulatory Visit: Payer: Self-pay | Admitting: Family Medicine

## 2009-08-13 ENCOUNTER — Telehealth: Payer: Self-pay | Admitting: Family Medicine

## 2009-09-09 ENCOUNTER — Ambulatory Visit: Payer: Self-pay | Admitting: Family Medicine

## 2009-09-09 ENCOUNTER — Encounter: Payer: Self-pay | Admitting: Family Medicine

## 2009-09-09 ENCOUNTER — Other Ambulatory Visit: Admission: RE | Admit: 2009-09-09 | Discharge: 2009-09-09 | Payer: Self-pay | Admitting: Family Medicine

## 2009-09-09 ENCOUNTER — Telehealth: Payer: Self-pay | Admitting: Family Medicine

## 2009-09-09 LAB — CONVERTED CEMR LAB
Bilirubin Urine: NEGATIVE
Blood in Urine, dipstick: NEGATIVE
Chlamydia, DNA Probe: NEGATIVE
GC Probe Amp, Genital: NEGATIVE
HCV Ab: NEGATIVE
Hepatitis B Surface Ag: NEGATIVE
Ketones, urine, test strip: NEGATIVE
Nitrite: NEGATIVE
Pap Smear: NEGATIVE
Urobilinogen, UA: 0.2
Whiff Test: POSITIVE

## 2009-09-11 ENCOUNTER — Encounter: Payer: Self-pay | Admitting: Family Medicine

## 2009-09-16 ENCOUNTER — Telehealth: Payer: Self-pay | Admitting: *Deleted

## 2009-10-08 ENCOUNTER — Ambulatory Visit: Payer: Self-pay | Admitting: Family Medicine

## 2009-10-08 ENCOUNTER — Telehealth: Payer: Self-pay | Admitting: Family Medicine

## 2009-10-08 ENCOUNTER — Encounter: Payer: Self-pay | Admitting: Family Medicine

## 2009-10-08 LAB — CONVERTED CEMR LAB
BUN: 11 mg/dL (ref 6–23)
CO2: 27 meq/L (ref 19–32)
Calcium: 9 mg/dL (ref 8.4–10.5)
Chlamydia, DNA Probe: NEGATIVE
Chloride: 101 meq/L (ref 96–112)
Creatinine, Ser: 0.9 mg/dL (ref 0.40–1.20)
GC Probe Amp, Genital: NEGATIVE
Glucose, Bld: 372 mg/dL — ABNORMAL HIGH (ref 70–99)
Potassium: 4.3 meq/L (ref 3.5–5.3)
Sodium: 135 meq/L (ref 135–145)
Whiff Test: POSITIVE

## 2009-10-10 ENCOUNTER — Encounter: Payer: Self-pay | Admitting: Family Medicine

## 2009-11-04 ENCOUNTER — Ambulatory Visit: Payer: Self-pay | Admitting: Family Medicine

## 2009-12-30 ENCOUNTER — Telehealth: Payer: Self-pay | Admitting: Family Medicine

## 2009-12-30 ENCOUNTER — Encounter: Payer: Self-pay | Admitting: Family Medicine

## 2009-12-30 ENCOUNTER — Ambulatory Visit: Payer: Self-pay | Admitting: Family Medicine

## 2009-12-30 ENCOUNTER — Telehealth (INDEPENDENT_AMBULATORY_CARE_PROVIDER_SITE_OTHER): Payer: Self-pay | Admitting: *Deleted

## 2009-12-30 LAB — CONVERTED CEMR LAB: Chlamydia, DNA Probe: NEGATIVE

## 2010-01-10 ENCOUNTER — Telehealth: Payer: Self-pay | Admitting: Family Medicine

## 2010-02-20 ENCOUNTER — Encounter: Payer: Self-pay | Admitting: Family Medicine

## 2010-02-24 ENCOUNTER — Ambulatory Visit: Admission: RE | Admit: 2010-02-24 | Discharge: 2010-02-24 | Payer: Self-pay | Source: Home / Self Care

## 2010-02-24 ENCOUNTER — Encounter: Payer: Self-pay | Admitting: Family Medicine

## 2010-02-24 DIAGNOSIS — N898 Other specified noninflammatory disorders of vagina: Secondary | ICD-10-CM | POA: Insufficient documentation

## 2010-02-24 DIAGNOSIS — R5383 Other fatigue: Secondary | ICD-10-CM | POA: Insufficient documentation

## 2010-02-24 DIAGNOSIS — R5381 Other malaise: Secondary | ICD-10-CM | POA: Insufficient documentation

## 2010-02-24 LAB — CONVERTED CEMR LAB
BUN: 11 mg/dL (ref 6–23)
Beta hcg, urine, semiquantitative: NEGATIVE
Bilirubin Urine: NEGATIVE
Blood in Urine, dipstick: NEGATIVE
CO2: 25 meq/L (ref 19–32)
Calcium: 9.7 mg/dL (ref 8.4–10.5)
Chlamydia, DNA Probe: NEGATIVE
Chloride: 96 meq/L (ref 96–112)
Cholesterol: 230 mg/dL — ABNORMAL HIGH (ref 0–200)
Creatinine, Ser: 0.87 mg/dL (ref 0.40–1.20)
GC Probe Amp, Genital: NEGATIVE
Glucose, Bld: 305 mg/dL — ABNORMAL HIGH (ref 70–99)
Glucose, Urine, Semiquant: 500
HDL: 74 mg/dL (ref >39)
LDL Cholesterol: 142 mg/dL — ABNORMAL HIGH (ref 0–99)
Nitrite: NEGATIVE
Potassium: 4.1 meq/L (ref 3.5–5.3)
Protein, U semiquant: NEGATIVE
Sodium: 133 meq/L — ABNORMAL LOW (ref 135–145)
Specific Gravity, Urine: 1.015
TSH: 1.208 microintl units/mL (ref 0.350–4.500)
Total CHOL/HDL Ratio: 3.1
Triglycerides: 70 mg/dL (ref <150)
Urobilinogen, UA: 0.2
VLDL: 14 mg/dL (ref 0–40)
WBC Urine, dipstick: NEGATIVE
Whiff Test: POSITIVE
pH: 7

## 2010-02-28 ENCOUNTER — Encounter: Payer: Self-pay | Admitting: Family Medicine

## 2010-02-28 ENCOUNTER — Telehealth: Payer: Self-pay | Admitting: *Deleted

## 2010-03-01 NOTE — Progress Notes (Signed)
Summary: triage  Phone Note Call from Patient Call back at Work Phone [phone]   Caller: Patient Summary of Call: pt thinks she has BV - wants to come in today Initial call taken by: De Nurse,  December 30, 2009 12:06 PM  Follow-up for Phone Call        appointment scheduled today. Follow-up by: Theresia Lo RN,  December 30, 2009 12:18 PM

## 2010-03-01 NOTE — Progress Notes (Signed)
Summary: phn msg  Phone Note Call from Patient Call back at Home Phone [phone]   Caller: Patient Summary of Call: has appt on 1/3 and has a yeast inf due to antibiotics [facility]  [address] Initial call taken by: De Nurse,  January 26, 2009 10:32 AM  Follow-up for Phone Call        to pcp Follow-up by: Golden Circle RN,  January 26, 2009 10:44 AM    New/Updated Medications: DIFLUCAN 150 MG TABS (FLUCONAZOLE) once daily Prescriptions: DIFLUCAN 150 MG TABS (FLUCONAZOLE) once daily  #1 x 0   Entered and Authorized by:   Helane Rima DO   Signed by:   Helane Rima DO on 01/26/2009   Method used:   [facility]        [facility]  [address] [phone]* (retail)       [address]       [place], [place]  [address]       Ph: [phone] or [phone]       Fax: [phone]   RxID:   [id number]  Diflucan x 1 sent to pharmacy. Please remind patient to make a follow-up appt to recheck UTI. Helane Rima DO  January 26, 2009 4:05 PM   Appended Document: phn msg left message that the rx she wanted was called in

## 2010-03-01 NOTE — Progress Notes (Signed)
Summary: refill  Phone Note Refill Request Call back at Home Phone [phone] Message from:  Patient  Refills Requested: Medication #1:  HUMULIN 70/30 70-30 % SUSP 40 U in the am and 30 U in the pm Initial call taken by: De Nurse,  February 05, 2009 12:14 PM  Follow-up for Phone Call        to pcp Follow-up by: Golden Circle RN,  February 05, 2009 12:15 PM    Prescriptions: HUMULIN 70/30 70-30 % SUSP (INSULIN ISOPHANE & REGULAR) 40 U in the am and 30 U in the pm  #1 vial x 3   Entered and Authorized by:   Helane Rima DO   Signed by:   Helane Rima DO on 02/07/2009   Method used:   [facility]        [facility]  [address] [phone]* (retail)       [address]       [place], [place]  [id number]       Ph: [phone] or [phone]       Fax: [phone]   RxID:   [id number]  Please call patient and have her make an appt to see me for follow up from hospital and to address diabetes. Helane Rima DO  February 07, 2009 5:04 PM

## 2010-03-01 NOTE — Progress Notes (Signed)
Summary: phnmsg  Phone Note Call from Patient Call back at Home Phone [phone]   Caller: Patient Summary of Call: wants to know if she has a yeast inf - no one has called yet and she is still having a problem Initial call taken by: De Nurse,  September 16, 2009 3:56 PM  Follow-up for Phone Call        the number given is incorrect Follow-up by: Golden Circle RN,  September 16, 2009 4:02 PM     Appended Document: phnmsg unable to reach pt. meds at the pharmacy. dnka f/up ov.

## 2010-03-01 NOTE — Letter (Signed)
Summary: Generic Letter  [name]  [address]   [place], [place] [address]   Phone: [phone]  Fax: [phone]    10/10/2009  Kloi Stabenow [address] [place], [place]  [address]  Dear Ms. Pantano,  The results of your recent labs were all negative.   Sincerely,   Helane Rima DO  Appended Document: Generic Letter mailed.

## 2010-03-01 NOTE — Letter (Signed)
Summary: Contact Letter  [facility]  [address]   [place], [place] [address]   Phone: [phone]  Fax: [phone]    01/19/2009  Aziah Lampron [address] [place], [place]  [address]  Dear Ms. Gariepy,  I hope that you are feeling well since leaving the hospital. Please make an appointment for follow-up to make sure that you continue to improve and to make sure that your urinary tract infection has resolved completely. Please call if you have any questions or concerns.   Sincerely,   Helane Rima DO  Appended Document: Contact Letter mailed.

## 2010-03-01 NOTE — Assessment & Plan Note (Signed)
Summary: back pain/[place]/wallace   Vital Signs:  Patient profile:   30 year old female Height:      67 inches Weight:      160.1 pounds BMI:     25.17 Temp:     98.4 degrees F oral Pulse rate:   82 / minute BP sitting:   102 / 66  (left arm) Cuff size:   regular  Vitals Entered By: Garen Grams LPN (July 13, 2009 8:48 AM) CC: back pain x 1.5 weeks Is Patient Diabetic? No Pain Assessment Patient in pain? yes     Location: back Intensity: 7   Primary Care Provider:  Helane Rima DO  CC:  back pain x 1.5 weeks.  History of Present Illness: 1) Vaginal discharge: Reports some occasional vaginal discharge, brownish, odorless for past two days. Denies vaginal itching, dysuria, fever, abdominal pain. Recently treated for bacterial vaginosis.   2) Low back pain: x past 2 months. Located on either side of lumbar spine - occasionally radiates into right hip. Worse with standing all day, bending over; better with lying down, sitting down. Denies leg weakness/numbness, bowel or bladder changes, saddle numbness.   3) "Boils" in groin area: Patient notes recurrent boils in groin area - start as small pimple, increase in size, drain purulent material. Has one larger boil in groin area on at left thigh crease as well as a few smaller ones scattered. Denies fevers, chills, nausea, emesis.   Habits & Providers  Alcohol-Tobacco-Diet     Tobacco Status: current     Tobacco Counseling: to quit use of tobacco products     Cigarette Packs/Day: 0.75  Current Medications (verified): 1)  Humulin 70/30 70-30 % Susp (Insulin Isophane & Regular) .... 40 U in The Am and 40 U in The Pm 2)  Adult Aspirin Low Strength 81 Mg  Tbdp (Aspirin) .[name].. 1 By Mouth Daily 3)  Bd Ultra-Fine Pen Needles 29g X 12.13mm  Misc (Insulin Pen Needle) .... Use As Directed With Insulin Pens 4)  Simvastatin 40 Mg Tabs (Simvastatin) .[name].. 1 By Mouth Daily 5)  Metformin Hcl 1000 Mg Tabs (Metformin Hcl) .... Take 1 Tablet By Mouth  Two Times A Day 6)  Bd Latitude Diabetes System  Kit (Blood Glucose Monitoring Suppl) .... Per Instructions 7)  Cyclobenzaprine Hcl 5 Mg Tabs (Cyclobenzaprine Hcl) .... Take 1-2 Tabs For Pain Ever 6 Hours As Needed For Pain. Do Not Drive or Operate Heavy Equipment While Taking This Medication. 8)  Ibuprofen 800 Mg Tabs (Ibuprofen) .... One Tab By Mouth Three Times A Day As Needed For Back Pain 9)  Cyclobenzaprine Hcl 5 Mg Tabs (Cyclobenzaprine Hcl) .... One Tab By Mouth Each Evening As Needed For Back Pain or Spasm 10)  Bactrim Ds 800-160 Mg Tabs (Sulfamethoxazole-Trimethoprim) .... Two Tabs By Mouth Two Times A Day X 10 Days  Allergies (verified): No Known Drug Allergies  Social History: Packs/Day:  0.75  Review of Systems       as per HPI   Physical Exam  General:  alert, NAD  Mouth:  no oral lesions  Abdomen:  soft. non tender. +BS. no CVA tenderness  Genitalia:  normal introitus, no external lesions, no vaginal discharge, mucosa pink and moist, no vaginal or cervical lesions, and no vaginal atrophy.   Msk:  - negative straight leg raise bilaterally - negative FABER bilaterally - full ROM with flexion, extension, lateral bending and rotation at lumbar spine with moderate spasm and pain with extremes of flexion.  -  no CVA tenderness  - 5/5 strength bilateral hips with flexion/extension, knees with flexion/extension, ankles with flexion/extension   Neurologic:  sensation intact to light touch bilateral lower extremities  Skin:  folliculitis in pubic area w/ three < 5 mm pustules w/ mild purulent drainage and one larger 2 x 1 cm area of induration w/o fluctuance or overlying erythema (though tender to palpation) near left thgh crease    Impression & Recommendations:  Problem # 1:  VAGINAL DISCHARGE (ICD-623.5) Assessment Deteriorated Negative wet prep. No discharge noted on exam. Advised regarding safe sexual practices, avoiding douching.  Orders: Wet Prep- [facility] ([address]) [facility]-  Est  Level 4 ([address])  Problem # 2:  FOLLICULITIS (ICD-704.8) Assessment: New  Pubic area. Advised against shaving. Will treat with Bactrim (given h/o DM). No areas for I+D - no fluctuance noted on exam. Advised warm compresses as well. Red flags reviewed with patient.   Orders: [facility]- Est  Level 4 ([address])  Problem # 3:  BACK PAIN, LUMBAR (ICD-724.2) Assessment: Deteriorated  Discussed use of moist heat or ice, modified activities, medications, and stretching/strengthening exercises. Back care instructions given. To be seen in 6 weeks if no improvement; sooner if worsening of symptoms. Likely lumbar strain / spasm.   Orders: [facility]- Est  Level 4 ([address])  Complete Medication List: 1)  Humulin 70/30 70-30 % Susp (Insulin isophane & regular) .... 40 u in the am and 40 u in the pm 2)  Adult Aspirin Low Strength 81 Mg Tbdp (Aspirin) .[name].. 1 by mouth daily 3)  Bd Ultra-fine Pen Needles 29g X 12.109mm Misc (Insulin pen needle) .... Use as directed with insulin pens 4)  Simvastatin 40 Mg Tabs (Simvastatin) .[name].. 1 by mouth daily 5)  Metformin Hcl 1000 Mg Tabs (Metformin hcl) .... Take 1 tablet by mouth two times a day 6)  Bd Latitude Diabetes System Kit (Blood glucose monitoring suppl) .... Per instructions 7)  Ibuprofen 800 Mg Tabs (Ibuprofen) .... One tab by mouth three times a day as needed for back pain 8)  Cyclobenzaprine Hcl 5 Mg Tabs (Cyclobenzaprine hcl) .... One tab by mouth each evening as needed for back pain or spasm 9)  Bactrim Ds 800-160 Mg Tabs (Sulfamethoxazole-trimethoprim) .... Two tabs by mouth two times a day x 10 days  Patient Instructions: 1)  Do the exercises as instructed for your back pain. 2)  Follow up in six weeks if you back pain has not improved.  3)  Take Flexeril as instructed for back spasm 4)  Take ibuprofen as instructed for back pain.  5)  Take Bactrim as instructed for your boil.  6)  Apply warm compresses to your boil three times a day 7)  If the pain and  swelling get worse call to be seen. Prescriptions: BACTRIM DS 800-160 MG TABS (SULFAMETHOXAZOLE-TRIMETHOPRIM) two tabs by mouth two times a day x 10 days  #40 x 0   Entered and Authorized by:   Bobby Rumpf  MD   Signed by:   Bobby Rumpf  MD on 07/13/2009   Method used:   [facility]        [facility]  [address] [phone]* (retail)       [address]       [place], [place]  [address]       Ph: [phone] or [phone]       Fax: [phone]   RxID:   [id number] CYCLOBENZAPRINE HCL 5 MG  TABS (CYCLOBENZAPRINE HCL) one tab by mouth each evening as needed for back pain or spasm  #20 x 0   Entered and Authorized by:   Bobby Rumpf  MD   Signed by:   Bobby Rumpf  MD on 07/13/2009   Method used:   [facility]        [facility]  [address] [phone]* (retail)       [address]       [place], [place]  [id number]       Ph: [phone] or [phone]       Fax: [phone]   RxID:   [id number] IBUPROFEN 800 MG TABS (IBUPROFEN) one tab by mouth three times a day as needed for back pain  #30 x 0   Entered and Authorized by:   Bobby Rumpf  MD   Signed by:   Bobby Rumpf  MD on 07/13/2009   Method used:   [facility]        [facility] [address] [phone]* (retail)       [address]       [place], [place]  [id number]       Ph: [phone] or [phone]       Fax: [phone]   RxID:   [id number]   Laboratory Results  Date/Time Received: July 13, 2009 9:23 AM  Date/Time Reported: July 13, 2009 9:38 AM   [employer] Source: vaginal WBC/hpf: 0-3 Bacteria/hpf: 3+  Rods Clue cells/hpf: none  Negative whiff Yeast/hpf: none Trichomonas/hpf: none Comments: ...........test performed by...........Marland KitchenTerese Door, CMA

## 2010-03-01 NOTE — Miscellaneous (Signed)
Summary: wants med: Diflucan refilled  Clinical Lists Changes read her md note & she will get the med for BV. states she always gets a yeast infection after antibiotics. wants md to call the pill in today so she can get both.Golden Circle RN  May 24, 2009 4:15 PM    Let message for patient. I called in an Rx for Diflucan wih 1 refill which she can use in 4 days if she is not improved. Also, she may want to use baking soda baths (draw warm bath to level of covering her legs while sitting in the bath, then add 1/2 or 1 box of baking soda to bath). Do this several times during a week to prevent these types of infections. Jamie Brookes MD  May 24, 2009 5:36 PM

## 2010-03-01 NOTE — Progress Notes (Signed)
Summary: triage  Phone Note Call from Patient Call back at Home Phone [phone]   Caller: Patient Summary of Call: Pt having back pain and discharge asking to be seen today. Initial call taken by: Clydell Hakim,  July 08, 2009 11:25 AM  Follow-up for Phone Call        LM Follow-up by: Golden Circle RN,  July 08, 2009 11:33 AM  Additional Follow-up for Phone Call Additional follow up Details #1::        hx back pain. has used muscle relaxers before. they helped. has none now.  now in hip & back & buttocks. has not taken anything.  also has vaginal discharge. white to brown. LMP 2 wks ago.  states she took the day off so she could come see the doctor. we have no appts left. she agreed to go to [facility] Additional Follow-up by: Golden Circle RN,  July 08, 2009 11:38 AM

## 2010-03-01 NOTE — Miscellaneous (Signed)
Summary: Consent R thumb tendon injection  Consent R thumb tendon injection   Imported By: Clydell Hakim 06/09/2009 14:12:59  _____________________________________________________________________  External Attachment:    Type:   Image     Comment:   External Document

## 2010-03-01 NOTE — Assessment & Plan Note (Signed)
Summary: vag discharge & back pain/[name]/[name]   Vital Signs:  Patient profile:   30 year old female Weight:      162.6 pounds Temp:     98.4 degrees F oral Pulse rate:   67 / minute Pulse rhythm:   regular BP sitting:   121 / 74  (left arm) Cuff size:   regular  Vitals Entered By: Loralee Pacas CMA (September 09, 2009 3:49 PM)  Primary Care Provider:  Helane Rima DO  CC:  vaginal discharge and back pain.  History of Present Illness: discharge: x 1 week.  + odor.  white to brown in color. thick.  possible exposure to STDs - had a new partner other than her boyfriend.  isn't always using protection. isn't interested in getting pregnant right now but cannot decide on a contraceptive choice.  does report some back pain but no dysuria or urinary frequency.  has been using cranberry pills with some relief  Current Medications (verified): 1)  Humulin 70/30 70-30 % Susp (Insulin Isophane & Regular) .... 40 U in The Am and 40 U in The Pm 2)  Adult Aspirin Low Strength 81 Mg  Tbdp (Aspirin) .[name].. 1 By Mouth Daily 3)  Bd Ultra-Fine Pen Needles 29g X 12.15mm  Misc (Insulin Pen Needle) .... Use As Directed With Insulin Pens 4)  Simvastatin 40 Mg Tabs (Simvastatin) .[name].. 1 By Mouth Daily 5)  Metformin Hcl 1000 Mg Tabs (Metformin Hcl) .... Take 1 Tablet By Mouth Two Times A Day 6)  Bd Latitude Diabetes System  Kit (Blood Glucose Monitoring Suppl) .... Per Instructions  Allergies (verified): No Known Drug Allergies  Past History:  Past medical, surgical, family and social histories (including risk factors) reviewed for relevance to current acute and chronic problems.  Past Medical History: Reviewed history from 03/03/2009 and no changes required. 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 diff Smoker  Past Surgical History: Reviewed history from 07/13/2008 and no changes required. BTL - 05/31/2003  Family History: Reviewed history from 03/03/2009 and no changes required. FHx of IDDM  Social History: Reviewed history from 03/03/2009 and no changes required. Smokes 1/3 ppd.  Occaional EtOH use.  No other drug use.  Has son Kathi Simpers born 2002) and daughter Alphia Kava born 2005). Was in jail for selling drugs - out Oct/2010.  Review of Systems       per HPI  Physical Exam  General:  alert, NAD  VS - WNL Genitalia:  thin white vaginal dischargenormal introitus, no external lesions, mucosa pink and moist, no vaginal or cervical lesions, no friaility or hemorrhage, normal uterus size and position, and no adnexal masses or tenderness.  no Cervical motion tenderness   Impression & Recommendations:  Problem # 1:  UNSPECIFIED VAGINITIS AND VULVOVAGINITIS (ICD-616.10) Assessment New check STD screens today pap performed today as well since due.  UA negative at this time for infection    [phone][facility] GC/Chlamydia-[facility] (87591/87491)  Complete Medication List: 1)  Humulin 70/30 70-30 % Susp (Insulin isophane & regular) .... 40 u in the am and 40 u in the pm 2)  Adult Aspirin Low Strength 81 Mg Tbdp (Aspirin) .[name].. 1 by mouth daily 3)  Bd Ultra-fine Pen  Needles 29g X 12.34mm Misc (Insulin pen needle) .... Use as directed with insulin pens 4)  Simvastatin 40 Mg Tabs (Simvastatin) .[name].. 1 by mouth daily 5)  Metformin Hcl 1000 Mg Tabs (Metformin hcl) .... Take 1 tablet by mouth two times a day 6)  Bd Latitude Diabetes System Kit (Blood glucose monitoring suppl) .... Per instructions  Other Orders: Urinalysis-[facility] (00000) Hep Bs Ag-[facility] ([address]40-98119) Hep C Ab-[facility] ([id number]) HIV-[facility] ([address]65-784[address]) RPR-[facility] [id number]) Pap Smear-[facility]  [phone]) [facility]- Est Level  3 ([address])  Laboratory Results   Urine Tests  Date/Time Received: September 09, 2009 3:20 PM  Date/Time Reported: September 09, 2009 3:43 PM   Routine Urinalysis   Color: yellow Appearance: Clear Glucose: 500   (Normal Range: Negative) Bilirubin: negative   (Normal Range: Negative) Ketone: negative   (Normal Range: Negative) Spec. Gravity: 1.020   (Normal Range: 1.003-1.035) Blood: negative   (Normal Range: Negative) pH: 7.0   (Normal Range: 5.0-8.0) Protein: negative   (Normal Range: Negative) Urobilinogen: 0.2   (Normal Range: 0-1) Nitrite: negative   (Normal Range: Negative) Leukocyte Esterace: negative   (Normal Range: Negative)    Comments: ...............test performed by......Marland KitchenBonnie A. [place], MLS (ASCP)cm  Date/Time Received: September 09, 2009 4:17 PM  Date/Time Reported: September 09, 2009 4:36 PM   [employer] Source: vag WBC/hpf: 1-5 Bacteria/hpf: 3+  Cocci Clue cells/hpf: many  Positive whiff Yeast/hpf: none Trichomonas/hpf: none Comments: ...............test performed by......Marland KitchenBonnie A. [place], MLS (ASCP)cm

## 2010-03-01 NOTE — Progress Notes (Signed)
Summary: triage  Phone Note Call from Patient Call back at Work Phone [phone]   Caller: Patient Summary of Call: has a vag d/c and wants to come in today Initial call taken by: De Nurse,  October 08, 2009 8:35 AM  Follow-up for Phone Call        started 1 wk ago. looks like white/brown color & smells. to be here at 10:30 for work in. aware of wait  needs [facility] letter Follow-up by: Golden Circle RN,  October 08, 2009 8:40 AM

## 2010-03-01 NOTE — Assessment & Plan Note (Signed)
Summary: vag d/c/[place]/[name] DISCUSS DNKAS!   Vital Signs:  Patient profile:   30 year old female Height:      67 inches Weight:      156.1 pounds BMI:     24.54 Temp:     98.2 degrees F Pulse rate:   86 / minute BP sitting:   106 / 70  (left arm)  Vitals Entered By: Theresia Lo RN (October 08, 2009 11:13 AM) CC: vaginal discharge, odor, abdomen disconfort, has not been feeling well for a week. Is Patient Diabetic? Yes Did you bring your meter with you today? No Pain Assessment Patient in pain? no        Primary Care Provider:  Helane Rima DO  CC:  vaginal discharge, odor, abdomen disconfort, and has not been feeling well for a week.[name]  History of Present Illness: 30 yo F:  1. Vaginal Discharge: x 1 week, associated with abdominal and low back discomfort, discharge is white-brown. Denies lesions, rash, fever/chills, N/V/D/C, dysuria. Endorses itch. Sexually active with 2 men, not using protection, wants STD testing. LMP last month, "starting soon."  2. DMI: Rx 70/30 - 40 units two times a day, but only taking in the am. NOT checking BG. Has meter and supplies, just hasn't been doing it. Has not be "feeling well" over the past several weeks, thinks it may have to do with DM. Decreased appetite. Normal UOP. Drinking fluids. Denies HA, dizziness, URI sxs, CP, SOB, LE edema.  Habits & Providers  Alcohol-Tobacco-Diet     Tobacco Status: current     Tobacco Counseling: to quit use of tobacco products     Cigarette Packs/Day: 0.5  Current Medications (verified): 1)  Humulin 70/30 70-30 % Susp (Insulin Isophane & Regular) .... 40 U in The Am and 40 U in The Pm 2)  Adult Aspirin Low Strength 81 Mg  Tbdp (Aspirin) .[name].. 1 By Mouth Daily 3)  Bd Ultra-Fine Pen Needles 29g X 12.50mm  Misc (Insulin Pen Needle) .... Use As Directed With Insulin Pens 4)  Simvastatin 40 Mg Tabs (Simvastatin) .[name].. 1 By Mouth Daily 5)  Metformin Hcl 1000 Mg Tabs (Metformin Hcl) .... Take 1 Tablet By  Mouth Two Times A Day 6)  Bd Latitude Diabetes System  Kit (Blood Glucose Monitoring Suppl) .... Per Instructions 7)  Metronidazole 500 Mg  Tabs (Metronidazole) .... One By Mouth Two Times A Day X 7 Days  Allergies (verified): No Known Drug Allergies PMH-FH-SH reviewed for relevance  Social History: Packs/Day:  0.5  Review of Systems      See HPI  Physical Exam  General:  Alert, NAD. Vitals reviewed.  Abdomen:  Soft. Non tender. +BS. No CVA tenderness.  Genitalia:  Normal introitus, no external lesions, mod vaginal discharge, mod vaginal bleeding, mucosa pink and moist, no vaginal or cervical lesions. Psych:  Oriented X3, flat.   Impression & Recommendations:  Problem # 1:  VAGINAL DISCHARGE (ICD-623.5) Assessment New  Orders: Wet Prep- [facility] (84696) [facility]- Est  Level 4 (29528)  Problem # 2:  BACTERIAL VAGINITIS (ICD-616.10) Assessment: New  Her updated medication list for this problem includes:    Metronidazole 500 Mg Tabs (Metronidazole) ..... One by mouth two times a day x 7 days  Orders: GC/Chlamydia-[facility] (87591/87491) [facility]- Est  Level 4 (41324)  Problem # 3:  DIABETES-TYPE 1 (ICD-250.01) Assessment: Unchanged Discussed importance of checking BG when taking insulin. Instructed patient to keep log. Eat regularly. Drink plenty of fluids. A1c and BMP today.  Follow up in 1-2 weeks to discuss in further detail. Her updated medication list for this problem includes:    Humulin 70/30 70-30 % Susp (Insulin isophane & regular) .Marland KitchenMarland KitchenMarland KitchenMarland Kitchen 40 u in the am and 40 u in the pm    Adult Aspirin Low Strength 81 Mg Tbdp (Aspirin) .[name]... 1 by mouth daily    Metformin Hcl 1000 Mg Tabs (Metformin hcl) .[name]... Take 1 tablet by mouth two times a day  Orders: Basic Met-[facility] (01027-25366) A1C-[facility] (44034) [facility]- Est  Level 4 (74259)  Complete Medication List: 1)  Humulin 70/30 70-30 % Susp (Insulin isophane & regular) .... 40 u in the am and 40 u in the pm 2)  Adult Aspirin Low Strength 81 Mg Tbdp  (Aspirin) .[name].. 1 by mouth daily 3)  Bd Ultra-fine Pen Needles 29g X 12.18mm Misc (Insulin pen needle) .... Use as directed with insulin pens 4)  Simvastatin 40 Mg Tabs (Simvastatin) .[name].. 1 by mouth daily 5)  Metformin Hcl 1000 Mg Tabs (Metformin hcl) .... Take 1 tablet by mouth two times a day 6)  Bd Latitude Diabetes System Kit (Blood glucose monitoring suppl) .... Per instructions 7)  Metronidazole 500 Mg Tabs (Metronidazole) .... One by mouth two times a day x 7 days  Patient Instructions: 1)  You have a bacterial infection. I have sent your prescription to the pharmacy. 2)  Follow up in 1 week to discuss your diabetes again. Prescriptions: METRONIDAZOLE 500 MG  TABS (METRONIDAZOLE) one by mouth two times a day x 7 days  #14 x 0   Entered and Authorized by:   Helane Rima DO   Signed by:   Helane Rima DO on 10/08/2009   Method used:   [facility]        [facility] [address] [address] [phone]* (retail)       [address]       [place], [place]  [id number]       Ph: [phone] or [phone]       Fax: [phone]   RxID:   [id number]   Laboratory Results  Date/Time Received: October 08, 2009 11:30 AM  Date/Time Reported: October 08, 2009 11:49 AM   [place] Source: vaginal WBC/hpf: 0-3 Bacteria/hpf: 3+  Cocci Clue cells/hpf: moderate  Positive whiff Yeast/hpf: none Trichomonas/hpf: none Comments: 1-5 RBC's present ...........test performed by...........Marland KitchenTerese Door, CMA     Appended Document: A1c  10.4 %    Lab Visit  Laboratory Results   Blood Tests   Date/Time Received: October 08, 2009 11:52 AM  Date/Time Reported: October 08, 2009 12:08 PM   HGBA1C: 10.4%   (Normal Range: Non-Diabetic - 3-6%   Control Diabetic - 6-8%)  Comments: ...............test performed by......Marland KitchenBonnie A. [place], MLS (ASCP)cm    Orders Today:

## 2010-03-01 NOTE — Letter (Signed)
Summary: Results Letter  [facility]  [address]   [place], [place] [address]   Phone: [phone]  Fax: [phone]    09/11/2009  Bryssa Spillers [address] [place], [place]  [address]  Dear Ms. Colon,  I am happy to inform you that your recent tests were all normal.  Sincerely,   Helane Rima DO  Appended Document: Results Letter mailed

## 2010-03-01 NOTE — Progress Notes (Signed)
Summary: Called pt  Left message for patient to let her know she has an infection. She has bacterial vaginosis like she had before. I has come back. So she is to do another 7 day course of Abx. I have already sent in the Rx. thanks. Jamie Brookes MD  May 24, 2009 1:50 PM

## 2010-03-01 NOTE — Assessment & Plan Note (Signed)
Summary: bacterial vaginosis, low back pain, Rt thumb tendon injection   Vital Signs:  Patient profile:   30 year old female Weight:      169.2 pounds Temp:     97.0 degrees F Pulse rate:   77 / minute BP sitting:   121 / 78  Vitals Entered By: Starleen Blue RN (May 24, 2009 11:24 AM) CC: bacterial vaginosis, trigger finger Is Patient Diabetic? Yes   Primary Care Provider:  Helane Rima DO  CC:  bacterial vaginosis and trigger finger.  History of Present Illness: Bacterial Vaginosis: PT has a h/o 1 week d/c. She has been having more frequent intercourse in the last 2 months. She is not having any itching or brning. She has had menstration q3 weeks since she got out of prison and is not having some spotting intermittantly.   Low back pain: PT has been having low back pain for the last 2 months since she started her job. 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  Trigger Finger: Pt has Rt thumb swelling and an inability to move it out of the bent position a few days ago. She says it is painful to the touch at time.   Habits & Providers  Alcohol-Tobacco-Diet     Tobacco Status: current     Cigarette Packs/Day: 0.25  Current Medications (verified): 1)  Humulin 70/30 70-30 % Susp (Insulin Isophane & Regular) .... 40 U in The Am and 40 U in The Pm 2)  Adult Aspirin Low Strength 81 Mg  Tbdp (Aspirin) .[name].. 1 By Mouth Daily 3)  Bd Ultra-Fine Pen Needles 29g X 12.94mm  Misc (Insulin Pen Needle) .... Use As Directed With Insulin Pens 4)  Simvastatin 40 Mg Tabs (Simvastatin) .[name].. 1 By Mouth Daily 5)  Diflucan 150 Mg Tabs (Fluconazole) .... Once Daily, Repeat in 4 Days If No Relief 6)  Metformin Hcl 1000 Mg Tabs (Metformin Hcl) .... Take 1 Tablet By Mouth Two Times A Day 7)  Bd Latitude Diabetes System  Kit (Blood Glucose Monitoring Suppl) .... Per Instructions 8)  Metronidazole 500 Mg  Tabs (Metronidazole) .... One By Mouth Two Times A Day X 7 Days 9)  Keflex 500 Mg Caps (Cephalexin)  .... One By Mouth Three Times A Day X 3 Days 10)  Cyclobenzaprine Hcl 5 Mg Tabs (Cyclobenzaprine Hcl) .... Take 1-2 Tabs For Pain Ever 6 Hours As Needed For Pain. Do Not Drive or Operate Heavy Equipment While Taking This Medication.  Allergies (verified): No Known Drug Allergies  Social History: Packs/Day:  0.25  Review of Systems        vitals reviewed and pertinent negatives and positives seen in HPI   Physical Exam  General:  Well-developed,well-nourished,in no acute distress; alert,appropriate and cooperative throughout examination Genitalia:  Normal introitus for age, no external lesions, no vaginal discharge, mucosa pink and moist, no vaginal or cervical lesions, no vaginal atrophy, no friaility or hemorrhage, normal uterus size and position, no adnexal masses or tenderness Extremities:  Rt thumb swelling and tenderness at base of thumb over tendon.    Impression & Recommendations:  Problem # 1:  BACTERIAL VAGINITIS (ICD-616.10) Assessment New Pt found to have bacterial vaginitis. She will be treated with flagyl  Her updated medication list for this problem includes:    Metronidazole 500 Mg Tabs (Metronidazole) ..... One by mouth two times a day x 7 days    Keflex 500 Mg Caps (Cephalexin) ..... One by mouth three times a day  x 3 days  Orders: [facility]- Est  Level 4 (16109)  Problem # 2:  BACK PAIN, LUMBAR (ICD-724.2) Assessment: New No change in her back pain routine.  Her updated medication list for this problem includes:    Adult Aspirin Low Strength 81 Mg Tbdp (Aspirin) .[name]... 1 by mouth daily    Cyclobenzaprine Hcl 5 Mg Tabs (Cyclobenzaprine hcl) .[name]... Take 1-2 tabs for pain ever 6 hours as needed for pain. do not drive or operate heavy equipment while taking this medication.  Orders: [facility]- Est  Level 4 (99214)  Problem # 3:  TRIGGER FINGER, RIGHT THUMB (ICD-727.03) Assessment: New PT given injection of lidocaine and steroids into the tendon. Consent signed. She  should feel better in the next 48 hours.   Orders: [facility]- Est  Level 4 (99214)  Complete Medication List: 1)  Humulin 70/30 70-30 % Susp (Insulin isophane & regular) .... 40 u in the am and 40 u in the pm 2)  Adult Aspirin Low Strength 81 Mg Tbdp (Aspirin) .[name].. 1 by mouth daily 3)  Bd Ultra-fine Pen Needles 29g X 12.59mm Misc (Insulin pen needle) .... Use as directed with insulin pens 4)  Simvastatin 40 Mg Tabs (Simvastatin) .[name].. 1 by mouth daily 5)  Diflucan 150 Mg Tabs (Fluconazole) .... Once daily, repeat in 4 days if no relief 6)  Metformin Hcl 1000 Mg Tabs (Metformin hcl) .... Take 1 tablet by mouth two times a day 7)  Bd Latitude Diabetes System Kit (Blood glucose monitoring suppl) .... Per instructions 8)  Metronidazole 500 Mg Tabs (Metronidazole) .... One by mouth two times a day x 7 days 9)  Keflex 500 Mg Caps (Cephalexin) .... One by mouth three times a day x 3 days 10)  Cyclobenzaprine Hcl 5 Mg Tabs (Cyclobenzaprine hcl) .... Take 1-2 tabs for pain ever 6 hours as needed for pain. do not drive or operate heavy equipment while taking this medication.  Other Orders[facility] (00000) [facility] Prescriptions: DIFLUCAN 150 MG TABS (FLUCONAZOLE) once daily, repeat in 4 days if no relief  #1 x 1   Entered and Authorized by:   Jamie Brookes MD   Signed by:   Jamie Brookes MD on 05/24/2009   Method used:   [facility] [phone]* (retail)       [address]       [place], [place]  [id number]       Ph: [phone] or [phone]       Fax: [phone]   RxID:   [id number] METRONIDAZOLE 500 MG  TABS (METRONIDAZOLE) one by mouth two times a day x 7 days  #14 x 0   Entered and Authorized by:   Jamie Brookes MD   Signed by:   Jamie Brookes MD on 05/24/2009   Method used:   [facility]        [facility]  [address] [phone]* (retail)       [address]       [place], [place]  [id number]       Ph: [phone] or [phone]       Fax: [phone]   RxID:   [id number] CYCLOBENZAPRINE HCL 5 MG TABS (CYCLOBENZAPRINE HCL) take 1-2 tabs for pain ever 6 hours as needed for pain. Do not drive or operate heavy equipment while  taking this medication.  #40 x 0   Entered and Authorized by:   Jamie Brookes MD   Signed by:   Jamie Brookes MD on 05/24/2009   Method used:   [facility]        [facility]  [address] [phone]* (retail)       [address]       [place], [place]  [id number]       Ph: [phone] or [phone]       Fax: [phone]   RxID:   [id number]    Vital Signs:  Patient profile:   30 year old female Weight:      169.2 pounds Temp:     97.0 degrees F Pulse rate:   77 / minute BP sitting:   121 / 78  Vitals Entered By: Starleen Blue RN (May 24, 2009 11:24 AM)   Procedure Note Last Tetanus: Tdap (03/03/2009)  Injections: Date of onset: 05/24/2009 Duration of symptoms: 2 days Indication: acute pain Consent signed: yes  Procedure # 1: tendon sheath injection    Region: palmar    Location: right hand    Technique: 20 g 1-1/2' needle    Medication: Triamcinolone 10 mg/mL    Anesthesia: 1.0 ml 1% lidocaine w/o epinephrine  Cleaned and prepped with: alcohol Wound dressing: bandaid  Laboratory Results   Urine Tests  Date/Time Received: May 24, 2009 11:30 AM  Date/Time Reported: May 24, 2009 11:56 AM   Routine Urinalysis   Color: yellow Appearance: Clear Glucose: negative   (Normal Range: Negative) Bilirubin: negative   (Normal Range: Negative) Ketone: negative   (Normal Range: Negative) Spec. Gravity: 1.015   (Normal Range: 1.003-1.035) Blood: negative   (Normal Range: Negative) pH: 7.0   (Normal Range: 5.0-8.0) Protein: negative   (Normal Range: Negative) Urobilinogen: 0.2   (Normal Range: 0-1) Nitrite: negative   (Normal Range: Negative) Leukocyte Esterace:  small   (Normal Range: Negative)  Urine Microscopic WBC/HPF: 1-5 RBC/HPF: rare Bacteria/HPF: 3+ chains of cocci Epithelial/HPF: 1-5    Comments: ...........test performed by...........Marland KitchenTerese Door, CMA  Date/Time Received: May 24, 2009 12:01 PM  Date/Time Reported: May 24, 2009 12:09 PM   [employer] Source: vaginal WBC/hpf: >20 Bacteria/hpf: 3+  Cocci Clue cells/hpf: none  Positive whiff Yeast/hpf: none Trichomonas/hpf: none Comments: ...........test performed by...........Marland KitchenTerese Door, CMA      Appended Document: disregard this append    Clinical Lists Changes  Problems: Added new problem of UTI (ICD-599.0) - please change h/o pyelonephritis to UTI Orders: Added new Test order of [facility]- Est  Level 4 (42595) - Signed Added new Test order of Urinalysis-[facility] (00000) - Signed  Please disregard above, changes made to 05-24-09.

## 2010-03-01 NOTE — Assessment & Plan Note (Signed)
Summary: f/u,df   Allergies: No Known Drug Allergies   Complete Medication List: 1)  Humulin 70/30 70-30 % Susp (Insulin isophane & regular) .... 40 u in the am and 30 u in the pm 2)  Novolog Penfill 100 Unit/ml Soln (Insulin aspart) .... Inject 10 units subcutaneously prior to meals disp: quantity sufficient x one month 3)  Adult Aspirin Low Strength 81 Mg Tbdp (Aspirin) .[name].. 1 by mouth daily 4)  Bd Ultra-fine Pen Needles 29g X 12.73mm Misc (Insulin pen needle) .... Use as directed with insulin pens 5)  Simvastatin 40 Mg Tabs (Simvastatin) .[name].. 1 by mouth daily 6)  Diflucan 150 Mg Tabs (Fluconazole) .... Once daily  Other Orders: No Charge Patient Arrived (NCPA0) (NCPA0)

## 2010-03-01 NOTE — Assessment & Plan Note (Signed)
Summary: vaginal discharge and odor/ls   Vital Signs:  Patient profile:   30 year old female Height:      67 inches Weight:      166 pounds Pulse rate:   89 / minute BP sitting:   131 / 86  (right arm) Cuff size:   regular  Vitals Entered By: Tessie Fass CMA (December 30, 2009 2:11 PM) CC: vaginal discharge   Primary Provider:  Helane Rima DO  CC:  vaginal discharge.  History of Present Illness: 30 yo F p/w white vaginal discharge x 7 days that is now malodorous. Sexually active with 2 partners currently. Does not use condoms. Hx of trichomonas 5 years ago and gonorrhea 2 years. ago. Denies vaginal bleeding, denies pelvic pain. OB history, has 2 children. Had BTL in 2005. No history of abnormal pap smears.    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 diff Smoker  Past Surgical History: Last updated: 07/13/2008 BTL - 05/31/2003  Family History: Last updated: 03/03/2009 FHx of IDDM  Social History: Last updated: 03/03/2009 Smokes 1/3 ppd.  Occaional EtOH use.  No other drug use.  Has son Kathi Simpers born 2002) and daughter Alphia Kava born 2005). Was in jail for selling drugs - out Oct/2010.  Risk Factors: Smoking Status: current (11/04/2009) Packs/Day: 0.5 (11/04/2009)  Family History: Reviewed history from 03/03/2009 and no changes required. FHx of IDDM  Social History: Reviewed history from 03/03/2009 and no changes required. Smokes 1/3 ppd.  Occaional EtOH use.  No other drug use.  Has son Kathi Simpers born 2002) and daughter Alphia Kava born 2005). Was in jail for  selling drugs - out Oct/2010.  Review of Systems       As per HPI  Physical Exam  Genitalia:  Single R labial papule.  White milky vaginal discharge. mucosa pink and moist, no vaginal or cervical lesions, no vaginal atrophy, no friaility or hemorrhage, and no cervical motion tenderness, no adnexal masses or tenderness.     Impression & Recommendations:  Problem # 1:  VAGINAL DISCHARGE (ICD-623.5) A: Exam finding suggestive of BV. Given pt's history will obtain the following test for STIs. Pt left before blood work ordered and before wet prep results. Blood work put in as a future order.  Orders: Wet Prep GC/Chlamydia-[facility] (87591/87491) Future Orders: HIV-[facility] ([address]) ... 12/31/2010 RPR-[facility] [phone]) ... 12/31/2010  -Counselled pt on condom use with every sexual encounter. Pt voices understanding.  - Told pt that she would be called with wet prep results since she had to leave. Will also tell her about requiring blood work for HIV/RPR.  -F/u CG/Chlam.  Problem # 2:  VAGINAL DISCHARGE (ICD-623.5)  Orders: Wet Prep- [facility] (02542) [facility]- Est Level  3 (70623)  Complete Medication List: 1)  Humulin 70/30 70-30 % Susp (Insulin isophane & regular) .... 40 u in the am and 40 u in the pm 2)  Adult Aspirin Low Strength 81 Mg Tbdp (Aspirin) .[name].. 1 by mouth daily 3)  Bd Ultra-fine Pen Needles 29g X 12.4mm Misc (Insulin pen needle) .... Use as directed with insulin pens 4)  Simvastatin 40 Mg Tabs (Simvastatin) .[name].. 1 by  mouth daily 5)  Metformin Hcl 1000 Mg Tabs (Metformin hcl) .... Take 1 tablet by mouth two times a day 6)  Bd Latitude Diabetes System Kit (Blood glucose monitoring suppl) .... Per instructions 7)  Permethrin 5 % Crea (Permethrin) .... Apply from head to toe and leave on for 8-14 hours per instructions. may reapply one week later. treat all family members. disp 60g   Orders Added: 1)  GC/Chlamydia-[facility] [87591/87491] 2)  Wet Prep- [facility] [87210] 3)  HIV-[facility]  [[id number]] 4)  RPR-[facility] [[id number]] 5)  [facility]- Est Level  3 [29562]  Appended Document: wet prep report    Lab Visit  Laboratory Results  Date/Time Received: December 30, 2009 3:00 PM  Date/Time Reported: December 30, 2009 3:28 PM   [employer] Source: vag WBC/hpf: 1-5 Bacteria/hpf: 3+  Cocci Clue cells/hpf: many  Positive whiff Yeast/hpf: none Trichomonas/hpf: none Comments: ...............test performed by......Marland KitchenBonnie A. [place], MLS (ASCP)cm   Orders Today:

## 2010-03-01 NOTE — Progress Notes (Signed)
Summary: wi request  Phone Note Call from Patient Call back at [phone]   Reason for Call: Talk to Nurse Summary of Call: wi request, having vaginal odor, pt is diabetic & is also having pain in her thumb Initial call taken by: Knox Royalty,  August 13, 2009 10:53 AM  Follow-up for Phone Call        odor x 1 wk. placed with pcp at 3 as her other pt at that time has cancelled Follow-up by: Golden Circle RN,  August 13, 2009 10:57 AM

## 2010-03-01 NOTE — Assessment & Plan Note (Signed)
Summary: FU hospital, DM, vaginal DC   Vital Signs:  Patient profile:   30 year old female Height:      67 inches Weight:      173 pounds BMI:     27.19 Temp:     98.1 degrees F oral Pulse rate:   80 / minute BP sitting:   121 / 83  (right arm) Cuff size:   regular  Vitals Entered By: Tessie Fass CMA (March 03, 2009 2:16 PM) CC: f/u hospital, DM, vaginal discharge Is Patient Diabetic? Yes Pain Assessment Patient in pain? no        Primary Care Provider:  Helane Rima DO  CC:  f/u hospital, DM, and vaginal discharge.  History of Present Illness: 30 year old AAF:  1. F/U Hospital: Pyelonephritis was diagnosed with CT scan and culture showed pan sensitive E. coli.  She was treated with IV Cipro and sent home with by mouth Cipro to finish 14 day course. Today, she denies abdominal pain, N/V/D/C, dysuria, hematuria.  2. DM: Rx 70/30 - 40 units in am and 40 units in pm. She does not check her BG 2/2 to not having machine. Denies CP, SOB, numbness/tingling in hands/feet, vision changes.  3. Vaginal Discharge: white - brown, itchy, no rash, + sexually active with 2 partners, using condoms with only one, wants to be checked for STDs, menses x 3 days last week, wants UPreg.  Habits & Providers  Alcohol-Tobacco-Diet     Tobacco Status: current     Cigarette Packs/Day: <0.25  Current Medications (verified): 1)  Humulin 70/30 70-30 % Susp (Insulin Isophane & Regular) .... 40 U in The Am and 40 U in The Pm 2)  Adult Aspirin Low Strength 81 Mg  Tbdp (Aspirin) .[name].. 1 By Mouth Daily 3)  Bd Ultra-Fine Pen Needles 29g X 12.41mm  Misc (Insulin Pen Needle) .... Use As Directed With Insulin Pens 4)  Simvastatin 40 Mg Tabs (Simvastatin) .[name].. 1 By Mouth Daily 5)  Diflucan 150 Mg Tabs (Fluconazole) .... Once Daily 6)  Metformin Hcl 1000 Mg Tabs (Metformin Hcl) .... Take 1 Tablet By Mouth Two Times A Day 7)  Bd Latitude Diabetes System  Kit (Blood Glucose Monitoring Suppl) .... Per  Instructions 8)  Metronidazole 500 Mg  Tabs (Metronidazole) .... 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 diff Smoker  Family History: FHx of IDDM  Social History: Smokes 1/3 ppd.  Occaional EtOH use.  No other drug use.  Has son Kathi Simpers born 2002) and daughter Alphia Kava born 2005). Was in jail for selling drugs - out Oct/2010.Packs/Day:  <0.25  Review of Systems       Today, she denies abdominal pain, N/V/D/C, dysuria, hematuria, CP, SOB, numbness/tingling in hands/feet.  Physical Exam  General:  Well appearing AAF in no acute distress. Vitals reviewed and WNL. Abdomen:  Bowel sounds positive,abdomen soft and non-tender without masses, organomegaly or hernias noted. Genitalia:  Normal introitus and no external lesions.  Small amount of white-clear vaginal discharge. No friability. Condom removed from vaginal vault (was there for unknown length of  time).   Impression & Recommendations:  Problem # 1:  PYELONEPHRITIS, HX OF (ICD-V13.00) Assessment Unchanged  TOC today. UA with trace LE, neg nitrates, > 5 WBC. Patient with no s/s. Will Cx before treating.  Orders: [facility]- Est  Level 4 (01093)  Problem # 2:  VAGINAL DISCHARGE (ICD-623.5) Assessment: New Wet prep + for BV. Will treat.  Orders: GC/Chlamydia-[facility] (87591/87491) Wet Prep- [facility] (23557) [facility]- Est  Level 4 (32202)  Problem # 3:  DIABETES-TYPE 1 (ICD-250.01) Assessment: Unchanged Rx glucometer (with supplies) with instructions to keep log of BG QID for [place] to review in 3-4 weeks. A1c = 11.9.  Discussed dangers of taking insulin without knowing BG. The following medications were removed from the medication list:    Novolog Penfill 100 Unit/ml Soln (Insulin aspart) ..... Inject 10 units subcutaneously prior to meals disp: quantity sufficient x one month Her updated medication list for this problem includes:    Humulin 70/30 70-30 % Susp (Insulin isophane & regular) .Marland KitchenMarland KitchenMarland KitchenMarland Kitchen 40 u in the am and 40 u in the pm    Adult Aspirin Low Strength 81 Mg Tbdp (Aspirin) .[name]... 1 by mouth daily    Metformin Hcl 1000 Mg Tabs (Metformin hcl) .[name]... Take 1 tablet by mouth two times a day  Orders: A1C-[facility] (54270) Urinalysis-[facility] (00000) [facility]- Est  Level 4 (62376)  Problem # 4:  BACTERIAL VAGINITIS (ICD-616.10) Assessment: New  Her updated medication list for this problem includes:    Metronidazole 500 Mg Tabs (Metronidazole) ..... One by mouth two times a day x 7 days  Orders: [facility]- Est  Level 4 (99214)  Complete Medication List: 1)  Humulin 70/30 70-30 % Susp (Insulin isophane & regular) .... 40 u in the am and 40 u in the pm 2)  Adult Aspirin Low Strength 81 Mg Tbdp (Aspirin) .[name].. 1 by mouth daily 3)  Bd Ultra-fine Pen Needles 29g X 12.24mm Misc (Insulin pen needle) .... Use as directed with insulin pens 4)  Simvastatin 40 Mg Tabs (Simvastatin) .[name].. 1 by mouth daily 5)  Diflucan 150 Mg Tabs (Fluconazole) .... Once daily 6)  Metformin Hcl 1000 Mg Tabs (Metformin hcl) .... Take 1 tablet by mouth two times a day 7)  Bd Latitude Diabetes System Kit (Blood glucose monitoring suppl) .... Per instructions 8)  Metronidazole 500 Mg Tabs (Metronidazole) .... One by mouth two times a day x 7 days  Other Orders: U Preg-[facility] (28315) Urine Culture-[facility] (17616-07371) Influenza Vaccine NON MCR (06269) Tdap => 76yrs IM (48546) Admin 1st Vaccine (27035)  Patient Instructions: 1)  It was nice to see you today. 2)  Please start checking your blood sugar at least 2 times a day and record them. I would like to go  over them with you in less than 1 month. Prescriptions: METRONIDAZOLE 500 MG  TABS (METRONIDAZOLE) one by mouth two times a day x 7 days  #14 x 0   Entered and Authorized by:   Helane Rima DO   Signed by:   Helane Rima DO on 03/04/2009   Method used:   [facility] [phone][facility] (retail)       [address]       [place], [place]  [address]       Ph: [phone] or [phone]       Fax: [phone]   RxID:   [id number] METRONIDAZOLE 500 MG  TABS (  METRONIDAZOLE) one by mouth two times a day x 7 days  #14 x 0   Entered and Authorized by:   Helane Rima DO   Signed by:   Helane Rima DO on 03/04/2009   Method used:   Print then Give to Patient   RxID:   [id number] BD LATITUDE DIABETES SYSTEM  KIT (BLOOD GLUCOSE MONITORING SUPPL) per instructions  #1 x 0   Entered and Authorized by:   Helane Rima DO   Signed by:   Helane Rima DO on 03/03/2009   Method used:   Print then Give to Patient   RxID:   [id number] BD LATITUDE DIABETES SYSTEM  KIT (BLOOD GLUCOSE MONITORING SUPPL) per instructions  #1 x 0   Entered and Authorized by:   Helane Rima DO   Signed by:   Helane Rima DO on 03/03/2009   Method used:   [facility] to        [facility] [phone]* (retail)       [address]       [place], [place]  [address]       Ph: [phone] or [phone]       Fax: [phone]   RxID:   [id number] SIMVASTATIN 40 MG TABS (SIMVASTATIN) 1 by mouth daily  #31 x 3   Entered and Authorized by:   Helane Rima DO   Signed by:   Helane Rima DO on 03/03/2009   Method used:   [facility]ronically to        [facility] [phone]* (retail)       [address]       [place], [place]  [address]       Ph: [phone] or [phone]       Fax: [phone]   RxID:   [id number] BD ULTRA-FINE PEN NEEDLES 29G X 12.7MM  MISC (INSULIN PEN  NEEDLE) use as directed with insulin pens  #200 x 11   Entered and Authorized by:   Helane Rima DO   Signed by:   Helane Rima DO on 03/03/2009   Method used:   [facility] to        [facility] [address] [phone]* (retail)       [address]       [place], [place]  [address]       Ph: [phone] or [phone]       Fax: [phone]   RxID:   [id number] ADULT ASPIRIN LOW STRENGTH 81 MG  TBDP (ASPIRIN) 1 by mouth daily  #30 x 11   Entered and Authorized by:   Helane Rima DO   Signed by:   Helane Rima DO on 03/03/2009   Method used:   Electronically to        [facility] [address] [phone]* (retail)       [address]       [place], [place]  [address]       Ph: [phone] or [phone]       Fax: [phone]   RxID:   [id number] HUMULIN 70/30 70-30 % SUSP (INSULIN ISOPHANE & REGULAR) 40 U in the am and 40 U in the pm  #3 vial x 3   Entered and Authorized by:   Helane Rima DO  Signed by:   Helane Rima DO on 03/03/2009   Method used:   Electronically to        [facility] [phone]* (retail)       [address]       [place], [place]  [address]       Ph: [phone] or [phone]       Fax: [phone]   RxID:   [id number] METFORMIN HCL 1000 MG TABS (METFORMIN HCL) Take 1 tablet by mouth two times a day  #180 x 3   Entered and Authorized by:   Helane Rima DO   Signed by:   Helane Rima DO on 03/03/2009   Method used:   [facility]ronically to        [facility] [address] [phone]* (retail)       [address]       [place], [place]  [address]       Ph: [phone] or [phone]       Fax: [phone]   RxID:   [id number]   Prevention & Chronic Care Immunizations   Influenza vaccine: Fluvax Non-MCR  (03/03/2009)    Tetanus booster: 03/03/2009: Tdap   Tetanus booster due: 05/30/2005    Pneumococcal vaccine: Not  documented  Other Screening   Pap smear: NEGATIVE FOR INTRAEPITHELIAL LESIONS OR MALIGNANCY.  (01/20/2008)   Pap smear due: 05/01/2006   Smoking status: current  (03/03/2009)   Smoking cessation counseling: yes  (11/18/2007)  Diabetes Mellitus   HgbA1C: 11.9  (03/03/2009)   Hemoglobin A1C due: 01/17/2007    Eye exam: Not documented    Foot exam: Not documented   High risk foot: Not documented   Foot care education: Not documented    Urine microalbumin/creatinine ratio: Not documented   Urine microalbumin/cr due: 01/19/2009    Diabetes flowsheet reviewed?: Yes   Progress toward A1C goal: Improved  Lipids   Total Cholesterol: 288  (01/20/2008)   LDL: 163  (01/20/2008)   LDL Direct: Not documented   HDL: 105  (01/20/2008)   Triglycerides: 102  (01/20/2008)    SGOT (AST): 33  (01/20/2008)   SGPT (ALT): 29  (01/20/2008)   Alkaline phosphatase: 80  (01/20/2008)   Total bilirubin: 0.6  (01/20/2008)    Lipid flowsheet reviewed?: Yes   Progress toward LDL goal: Unchanged  Self-Management Support :   Personal Goals (by the next clinic visit) :     Personal A1C goal: 8  (03/03/2009)     Personal blood pressure goal: 130/80  (03/03/2009)     Personal LDL goal: 100  (03/03/2009)    Patient will work on the following items until the next clinic visit to reach self-care goals:     Medications and monitoring: take my medicines every day  (03/03/2009)     Eating: drink diet soda or water instead of juice or soda, eat more vegetables, use fresh or frozen vegetables, eat foods that are low in salt, eat baked foods instead of fried foods, eat fruit for snacks and desserts, limit or avoid alcohol  (03/03/2009)     Activity: take a 30 minute walk every day, take the stairs instead of the elevator, park at the far end of the parking lot  (03/03/2009)    Diabetes self-management support: CBG self-monitoring log, Written self-care plan  (03/03/2009)   Diabetes care plan printed    Lipid  self-management support: Written self-care plan  (03/03/2009)   Lipid self-care plan printed.  Laboratory Results   Urine Tests  Date/Time Received: March 03, 2009 2:16 PM  Date/Time Reported: March 03, 2009 2:54 PM   Routine Urinalysis   Color: yellow Appearance: Clear Glucose: 500   (Normal Range: Negative) Bilirubin: negative   (Normal Range: Negative) Ketone: negative   (Normal Range: Negative) Spec. Gravity: <1.005   (Normal Range: 1.003-1.035) Blood: trace-intact   (Normal Range: Negative) pH: 6.5   (Normal Range: 5.0-8.0) Protein: negative   (Normal Range: Negative) Urobilinogen: 0.2   (Normal Range: 0-1) Nitrite: negative   (Normal Range: Negative) Leukocyte Esterace: trace   (Normal Range: Negative)  Urine Microscopic WBC/HPF: 15-25 with clumps RBC/HPF: occ Bacteria/HPF: 2+ rods and chains of cocci Epithelial/HPF: <5    Urine HCG: negative Comments: ...............test performed by......Marland KitchenBonnie A. [place], MLS (ASCP)cm   Blood Tests   Date/Time Received: March 03, 2009 2:16 PM  Date/Time Reported: March 03, 2009 5:21 PM   HGBA1C: 11.9%   (Normal Range: Non-Diabetic - 3-6%   Control Diabetic - 6-8%)  Comments: ...............test performed by......Marland KitchenBonnie A. [place], MLS (ASCP)cm  Date/Time Received: March 03, 2009 2:51 PM  Date/Time Reported: March 03, 2009 5:19 PM   [employer] Source: vag WBC/hpf: <5 Bacteria/hpf: 3+  cocci and rods Clue cells/hpf: moderate  Positive whiff Yeast/hpf: none Trichomonas/hpf: none Comments: ...............test performed by......Marland KitchenBonnie A. [place], MLS (ASCP)cm      Immunizations Administered:  Influenza Vaccine # 1:    Vaccine Type: Fluvax Non-MCR    Site: left deltoid    Mfr: GlaxoSmithKline    Dose: 0.5 ml    Route: IM    Given by: Tessie Fass CMA    Exp. Date: 07/29/2009    Lot #: [id number]    VIS given: 08/23/06 version given March 03, 2009.  Tetanus Vaccine:    Vaccine  Type: Tdap    Site: right deltoid    Mfr: GlaxoSmithKline    Dose: 0.5 ml    Route: IM    Given by: Tessie Fass CMA    Exp. Date: 03/27/2011    Lot #: [id number]    VIS given: 12/18/06 version given March 03, 2009.  Flu Vaccine Consent Questions:    Do you have a history of severe allergic reactions to this vaccine? no    Any prior history of allergic reactions to egg and/or gelatin? no    Do you have a sensitivity to the preservative Thimersol? no    Do you have a past history of Guillan-Barre Syndrome? no    Do you currently have an acute febrile illness? no    Have you ever had a severe reaction to latex? no    Vaccine information given and explained to patient? yes    Are you currently pregnant? no   Appended Document: UCx > 100,000 GBS Urine culture with > 100,000 GBS indicating UTI. Recent pyelonephritis 2/2 to E.coli. Will treat with Keflex 500 three times a day x 3 days. Please inform patient that she has a UTI and that Abx have been sent to her pharmacy.  Appended Document: Rx Keflex    Clinical Lists Changes  Medications: Added new medication of KEFLEX 500 MG CAPS (CEPHALEXIN) one by mouth three times a day x 3 days - Signed Rx of KEFLEX 500 MG CAPS (CEPHALEXIN) one by mouth three times a day x 3 days;  #9 x 0;  Signed;  Entered by: Helane Rima DO;  Authorized by: Helane Rima DO;  Method used: [facility] [phone]*, [address] [place], [place], [place]  [address], Ph: [phone] or [phone], Fax: [phone]    Prescriptions: KEFLEX 500 MG CAPS (CEPHALEXIN) one by mouth three times a day x 3 days  #9 x 0   Entered and Authorized by:   Helane Rima DO   Signed by:   Helane Rima DO on 03/07/2009   Method used:   Electronically to        [facility] [phone]* (retail)       [address]       [place], [place]  [address]       Ph: [phone] or [phone]       Fax:  [phone]   RxID:   [id number]

## 2010-03-01 NOTE — Miscellaneous (Signed)
Summary: re: UA CX/TS  Clinical Lists Changes called pt and lmvm 'please check your pharmacy and call us back'...see append on 03-03-09 ov. waiting for pt to call back .Arlyss Repress CMA,  March 08, 2009 10:33 AM   Appended Document: Orders Update    Clinical Lists Changes  Problems: Added new problem of CONTACT OR EXPOSURE TO OTHER VIRAL DISEASES (ICD-V01.79)

## 2010-03-01 NOTE — Progress Notes (Signed)
Summary: re labs/ts  ---- Converted from flag ---- ---- 03/04/2009 6:40 AM, Helane Rima DO wrote: please call ms Stave and let her know that she has a bacterial vaginitis. it is not an STD. her GC/Chlamydia tests were negative. we are still waiting on her urine culture. also, please remind her to keep a log of her blood sugar and to follow up in 2-4 weeks. make sure that she got the glucose meter. thanks!  CALLED pt and informed of above.  Pt needs updated script for her Glucometer, she did not receive her supplies from [facility], due to 'incorrect' RX. [facility] will call us per pt.  fwd. to Dr.Wallace .  P.S. Rx needs to state DX and how often to check BS.  Marland KitchenArlyss Repress CMA,  March 04, 2009 11:40 AM  Dx = Insulin-dependent Diabetes Mellitus Check glucose QID  Helane Rima DO  March 04, 2009 6:24 PM

## 2010-03-01 NOTE — Assessment & Plan Note (Signed)
Summary: rash,df   Vital Signs:  Patient profile:   30 year old female Height:      67 inches Weight:      161.3 pounds BMI:     25.35 Temp:     98.2 degrees F oral Pulse rate:   77 / minute BP sitting:   130 / 82  (left arm) Cuff size:   regular  Vitals Entered By: Garen Grams LPN (November 04, 2009 3:08 PM) CC: itching all over Is Patient Diabetic? No Pain Assessment Patient in pain? no        Primary Care Provider:  Helane Rima DO  CC:  itching all over.  History of Present Illness: 1) Rash: Very itchy rash wrists, webbing of fingers, lower back x 1 week. + kids (2) at home itching as well for 2 months (went to pediatrician - told that it was not scabies - advised to use calomine lotion - no relief of symptoms). + boyfriend itching as well. Has been taking bleach baths without relief. Family treated for scabies one year ago - this feels similar per patient.   ROS: Denies nausea, vomiting or diarrhea, URI sympotms, fever, weight change, oral lesions breathing issues, new soaps or detergents or lotion.   see prior meds for med rec   Habits & Providers  Alcohol-Tobacco-Diet     Tobacco Status: current     Tobacco Counseling: to quit use of tobacco products     Cigarette Packs/Day: 0.5  Medications Prior to Update: 1)  Humulin 70/30 70-30 % Susp (Insulin Isophane & Regular) .... 40 U in The Am and 40 U in The Pm 2)  Adult Aspirin Low Strength 81 Mg  Tbdp (Aspirin) .[name].. 1 By Mouth Daily 3)  Bd Ultra-Fine Pen Needles 29g X 12.7mm  Misc (Insulin Pen Needle) .... Use As Directed With Insulin Pens 4)  Simvastatin 40 Mg Tabs (Simvastatin) .[name].. 1 By Mouth Daily 5)  Metformin Hcl 1000 Mg Tabs (Metformin Hcl) .... Take 1 Tablet By Mouth Two Times A Day 6)  Bd Latitude Diabetes System  Kit (Blood Glucose Monitoring Suppl) .... Per Instructions 7)  Metronidazole 500 Mg  Tabs (Metronidazole) .... One By Mouth Two Times A Day X 7 Days  Allergies (verified): No Known Drug  Allergies  Physical Exam  General:  Alert, NAD. Vitals reviewed.  Eyes:  no conjucntivitis  Mouth:  no oral lesions  Skin:  excoriation at bilateral wrists with scattered 0.5 mm vesicular lesions noted as well, excoriation at lower back without other skin changes noted    Impression & Recommendations:  Problem # 1:  SCABIES (ICD-133.0) Assessment New  Clinically appears to be scabies especially given household contacts with similar symptoms. Will treat as below. Permethrin cream sent to pharmacy to treat all household contacts. Advised against bleach baths given potential for skin irritation. Follow up with PCP. Handout for scabies given describing treatment and eradication.   Orders: [facility]- Est Level  3 (99213)  Complete Medication List: 1)  Humulin 70/30 70-30 % Susp (Insulin isophane & regular) .... 40 u in the am and 40 u in the pm 2)  Adult Aspirin Low Strength 81 Mg Tbdp (Aspirin) .[name].. 1 by mouth daily 3)  Bd Ultra-fine Pen Needles 29g X 12.39mm Misc (Insulin pen needle) .... Use as directed with insulin pens 4)  Simvastatin 40 Mg Tabs (Simvastatin) .[name].. 1 by mouth daily 5)  Metformin Hcl 1000 Mg Tabs (Metformin hcl) .... Take 1 tablet by mouth  two times a day 6)  Bd Latitude Diabetes System Kit (Blood glucose monitoring suppl) .... Per instructions 7)  Permethrin 5 % Crea (Permethrin) .... Apply from head to toe and leave on for 8-14 hours per instructions. may reapply one week later. treat all family members. disp 60g  Patient Instructions: 1)  It was great to see you today! 2)  Treat for scabies as instructed 3)  Follow up with your regular doctor for other chronic concerns  Prescriptions: PERMETHRIN 5 % CREA (PERMETHRIN) Apply from head to toe and leave on for 8-14 hours per instructions. May reapply one week later. Treat all family members. Disp 60g  #3 x 1   Entered and Authorized by:   Bobby Rumpf  MD   Signed by:   Bobby Rumpf  MD on 11/04/2009   Method used:    [facility]ronically to        [facility] [place] [facility] [address] [phone]* (retail)       [address]       [place], [place]  [address]       Ph: [phone] or [phone]       Fax: [phone]   RxID:   [id number]

## 2010-03-01 NOTE — Progress Notes (Signed)
Summary: Diflucan for post BV treatment   Primary Provider:  Helane Rima DO   History of Present Illness: Called pt at home. Told her results of wet prep and to pick up flagyl. Pt asked for diflucan bc she tends to get yeast infections after tx of BV. Prescribed diflucan.   Allergies: No Known Drug Allergies   Complete Medication List: 1)  Humulin 70/30 70-30 % Susp (Insulin isophane & regular) .... 40 u in the am and 40 u in the pm 2)  Adult Aspirin Low Strength 81 Mg Tbdp (Aspirin) .[name].. 1 by mouth daily 3)  Bd Ultra-fine Pen Needles 29g X 12.86mm Misc (Insulin pen needle) .... Use as directed with insulin pens 4)  Simvastatin 40 Mg Tabs (Simvastatin) .[name].. 1 by mouth daily 5)  Metformin Hcl 1000 Mg Tabs (Metformin hcl) .... Take 1 tablet by mouth two times a day 6)  Bd Latitude Diabetes System Kit (Blood glucose monitoring suppl) .... Per instructions 7)  Permethrin 5 % Crea (Permethrin) .... Apply from head to toe and leave on for 8-14 hours per instructions. may reapply one week later. treat all family members. disp 60g 8)  Metronidazole 500 Mg Tabs (Metronidazole) .... One tab twice daily for next 7 days. 9)  Fluconazole 150 Mg Tabs (Fluconazole) .... One tab by mouth x 1 dose to prevent yeast infection following treatment for bacterial vaginosis. Prescriptions: FLUCONAZOLE 150 MG TABS (FLUCONAZOLE) one tab by mouth x 1 dose to prevent yeast infection following treatment for bacterial vaginosis.  #1 x 0   Entered and Authorized by:   Dessa Phi MD   Signed by:   Dessa Phi MD on 12/30/2009   Method used:   [facility]ronically to        [facility] [address] [phone]* (retail)       [address]       [place], [place]  [address]       Ph: [phone] or [phone]       Fax: [phone]   RxID:   [id number]

## 2010-03-01 NOTE — Progress Notes (Signed)
Summary: triage  Phone Note Call from Patient Call back at [phone]   Caller: Patient Summary of Call: has d/c and back pain-wants to come in today Initial call taken by: De Nurse,  September 09, 2009 11:23 AM  Follow-up for Phone Call        has had the discharge for 2-3 days. work in appt at 3. aware of wait.Golden Circle RN  September 09, 2009 11:29 AM  Follow-up by: Golden Circle RN,  September 09, 2009 11:27 AM

## 2010-03-01 NOTE — Progress Notes (Signed)
Summary: wi request  Phone Note Call from Patient Call back at Home Phone [phone]   Reason for Call: Talk to Nurse Summary of Call: pt is requesting wi appt for back pain & discharge Initial call taken by: Knox Royalty,  July 12, 2009 2:36 PM  Follow-up for Phone Call        states she did not go to UC. appt in workin at 8:30 tomorrow am Follow-up by: Golden Circle RN,  July 12, 2009 2:37 PM

## 2010-03-01 NOTE — Miscellaneous (Signed)
Summary: needs appt  Clinical Lists Changes left message for pt to call me. she needs a hospital f/u appt & to address her diabetes. We have openings with her pcp today if she can come in.Golden Circle RN  February 08, 2009 9:18 AM  has appt appt on 27th. told her we will see her then. Marland KitchenGolden Circle RN  February 08, 2009 4:39 PM

## 2010-03-03 NOTE — Progress Notes (Signed)
Summary: UPDATE PROBLEM LIST   

## 2010-03-03 NOTE — Assessment & Plan Note (Signed)
Summary: cpe,df   Vital Signs:  Patient profile:   30 year old female Height:      67 inches Weight:      159 pounds BMI:     24.99 Temp:     98.2 degrees F oral Pulse rate:   80 / minute BP sitting:   126 / 82  (left arm) Cuff size:   regular  Vitals Entered By: Tessie Fass CMA (February 24, 2010 2:06 PM) CC: vaginal DC, DMII Is Patient Diabetic? Yes Pain Assessment Patient in pain? yes     Location: lower back Intensity: 5   Primary Care Provider:  Helane Rima DO  CC:  vaginal DC and DMII.  History of Present Illness: 30 yo F:  1. Vaginal Discharge: x 1 week, associated with abdominal and low back discomfort, discharge is white. Denies lesions, rash, fever/chills, N/V/D/C, dysuria. Endorses itch. Sexually active with 1 man, not using protection, wants STD testing. LMP mid December.  2. DMI: Rx 70/30 - 40 units two times a day, but only taking in the am. NOT checking BG. Has meter and supplies, just hasn't been doing it. Has not be "feeling well" over the past several weeks, thinks it may have to do with DM. Decreased appetite. Normal UOP. Drinking fluids. Denies dizziness, URI sxs, CP, SOB, LE edema. Has daily HA - generalized, sometimes band-like, sometimes with photophobia, phonophobia. No energy.  Phone: [phone]  Habits & Providers  Alcohol-Tobacco-Diet     Tobacco Status: current     Tobacco Counseling: to quit use of tobacco products     Cigarette Packs/Day: 0.25  Current Medications (verified): 1)  Humulin 70/30 70-30 % Susp (Insulin Isophane & Regular) .... 40 U in The Am and 40 U in The Pm 2)  Adult Aspirin Low Strength 81 Mg  Tbdp (Aspirin) .[name].. 1 By Mouth Daily 3)  Bd Ultra-Fine Pen Needles 29g X 12.68mm  Misc (Insulin Pen Needle) .... Use As Directed With Insulin Pens 4)  Simvastatin 40 Mg Tabs (Simvastatin) .[name].. 1 By Mouth Daily 5)  Bd Latitude Diabetes System  Kit (Blood Glucose Monitoring Suppl) .... Per Instructions  Allergies  (verified): No Known Drug Allergies PMH-FH-SH reviewed for relevance  Social History: Packs/Day:  0.25  Review of Systems      See HPI  Physical Exam  General:  Alert, NAD. Vitals reviewed.  Neck:  No deformities, masses, or tenderness noted. Lungs:  Normal respiratory effort, normal breath sounds, no crackles, and no wheezes.   Heart:  Normal rate and regular rhythm.  2/6 SEM Abdomen:  Bowel sounds positive,abdomen soft and non-tender without masses, organomegaly or hernias noted. Genitalia:  Pelvic Exam:        External: normal female genitalia without lesions or masses        Vagina: normal without lesions or masses, small amount of white DC - samples taken.        Cervix: normal without lesions or masses        Adnexa: normal bimanual exam without masses or fullness        Uterus: normal by palpation        Pap smear: not performed Pulses:  2+DP. Extremities:  No edema. Skin:  Intact without suspicious lesions or rashes.   Impression & Recommendations:  Problem # 1:  VAGINAL DISCHARGE (ICD-623.5) Assessment New + Clue. Will treat for BV. Orders: [facility]- Est  Level 4 (99214)  Problem # 2:  DIABETES-TYPE 1 (ICD-250.01) Assessment: Unchanged Discussed importance of  DM control in managing her symptoms of fatigue, nausea, and HA. Discussed danger of taking insuling without checking BG. Follow up in Pharmacy Clinic in 2-3 weeks. NOTE: Patient with glucose and ketones in urine. She states that she has not eaten today. The following medications were removed from the medication list:    Metformin Hcl 1000 Mg Tabs (Metformin hcl) .[name]... Take 1 tablet by mouth two times a day Her updated medication list for this problem includes:    Humulin 70/30 70-30 % Susp (Insulin isophane & regular) .Marland KitchenMarland KitchenMarland KitchenMarland Kitchen 40 u in the am and 40 u in the pm    Adult Aspirin Low Strength 81 Mg Tbdp (Aspirin) .[name]... 1 by mouth daily  Orders: Basic Met-[facility] (60454-09811) A1C-[facility] (91478) [facility]- Est  Level 4  (29562)  Problem # 3:  FATIGUE (ICD-780.79) Assessment: Unchanged Likely all 2/2 uncontrolled DM, but will check TSH. Orders: TSH-[facility] (13086-57846) [facility]- Est  Level 4 (96295)  Complete Medication List: 1)  Humulin 70/30 70-30 % Susp (Insulin isophane & regular) .... 40 u in the am and 40 u in the pm 2)  Adult Aspirin Low Strength 81 Mg Tbdp (Aspirin) .[name].. 1 by mouth daily 3)  Bd Ultra-fine Pen Needles 29g X 12.68mm Misc (Insulin pen needle) .... Use as directed with insulin pens 4)  Simvastatin 40 Mg Tabs (Simvastatin) .[name].. 1 by mouth daily 5)  Bd Latitude Diabetes System Kit (Blood glucose monitoring suppl) .... Per instructions 6)  Metronidazole 500 Mg Tabs (Metronidazole) .... One by mouth two times a day x 7 days 7)  Diflucan 150 Mg Tabs (Fluconazole) .... Once daily  Other Orders: U Preg-[facility] (81025) Urinalysis-[facility] (00000) Lipid-[facility] (28413-24401) GC/Chlamydia-[facility] (87591/87491) [facility] ([facility]  Patient Instructions: 1)  You will not feel better until we get your diabetes under control. 2)  You must check your blood sugar when you take insulin. 3)  Please make an appointment to discuss your diabetes in the PHARMACY CLINIC. Prescriptions: DIFLUCAN 150 MG TABS (FLUCONAZOLE) once daily  #1 x 0   Entered and Authorized by:   Helane Rima DO   Signed by:   Helane Rima DO on 02/24/2010   Method used:   Electronically to        [facility]  [address] [phone]* (retail)       [address]       [place], [place]  [id number]       Ph: [phone] or [phone]       Fax: [phone]   RxID:   [id number] METRONIDAZOLE 500 MG  TABS (METRONIDAZOLE) one by mouth two times a day x 7 days  #14 x 0   Entered and Authorized by:   Helane Rima DO   Signed by:   Helane Rima DO on 02/24/2010   Method used:   [facility] to        [facility]  [address] [phone]* (retail)       [address]       [place], [place]   [id number]       Ph: [phone] or [phone]       Fax: [phone]   RxID:   [id number]    Orders Added: 1)  U Preg-[facility] [81025] 2)  Basic Met-[facility] [75102-58527] 3)  Urinalysis-[facility] [00000] 4)  A1C-[facility] [83036] 5)  TSH-[facility] [78242-35361] 6)  Lipid-[facility] [80061-22930] 7)  GC/Chlamydia-[facility] [87591/87491] 8)  Wet Prep-  [facility] [87210] 9)  [facility]- Est  Level 4 [16109]    Laboratory Results   Urine Tests  Date/Time Received: February 24, 2010 2:19 PM  Date/Time Reported: February 24, 2010 2:32 PM February 24, 2010 2:49 PM   Routine Urinalysis   Color: yellow Appearance: Clear Glucose: 500   (Normal Range: Negative) Bilirubin: negative   (Normal Range: Negative) Ketone: moderate (40)   (Normal Range: Negative) Spec. Gravity: 1.015   (Normal Range: 1.003-1.035) Blood: negative   (Normal Range: Negative) pH: 7.0   (Normal Range: 5.0-8.0) Protein: negative   (Normal Range: Negative) Urobilinogen: 0.2   (Normal Range: 0-1) Nitrite: negative   (Normal Range: Negative) Leukocyte Esterace: negative   (Normal Range: Negative)    Urine HCG: negative Comments: ...............test performed by......Marland KitchenBonnie A. [place], MLS (ASCP)cm  Date/Time Received: February 24, 2010 2:19 PM  Date/Time Reported: February 24, 2010 2:49 PM   Wet [place] Source: vag WBC/hpf: 1-5 Bacteria/hpf: 2+  Cocci Clue cells/hpf: many  Positive whiff Yeast/hpf: occ Trichomonas/hpf: none Comments: few sperm ...............test performed by......Marland KitchenBonnie A. [place], MLS (ASCP)cm     Prevention & Chronic Care Immunizations   Influenza vaccine: Fluvax Non-MCR  (03/03/2009)    Tetanus booster: 03/03/2009: Tdap   Tetanus booster due: 05/30/2005    Pneumococcal vaccine: Not documented  Other Screening   Pap smear: NEGATIVE FOR INTRAEPITHELIAL LESIONS OR MALIGNANCY.  (09/09/2009)   Pap smear due: 05/01/2006   Smoking status: current  (02/24/2010)   Smoking cessation counseling: yes   (11/18/2007)  Diabetes Mellitus   HgbA1C: 10.4  (10/08/2009)   Hemoglobin A1C due: 01/17/2007    Eye exam: Not documented    Foot exam: Not documented   High risk foot: Not documented   Foot care education: Not documented    Urine microalbumin/creatinine ratio: Not documented   Urine microalbumin/cr due: 01/19/2009    Diabetes flowsheet reviewed?: Yes   Progress toward A1C goal: Unchanged  Lipids   Total Cholesterol: 288  (01/20/2008)   LDL: 163  (01/20/2008)   LDL Direct: Not documented   HDL: 105  (01/20/2008)   Triglycerides: 102  (01/20/2008)    SGOT (AST): 33  (01/20/2008)   SGPT (ALT): 29  (01/20/2008)   Alkaline phosphatase: 80  (01/20/2008)   Total bilirubin: 0.6  (01/20/2008)    Lipid flowsheet reviewed?: Yes   Progress toward LDL goal: Unchanged  Self-Management Support :   Personal Goals (by the next clinic visit) :     Personal A1C goal: 8  (03/03/2009)     Personal blood pressure goal: 130/80  (03/03/2009)     Personal LDL goal: 100  (03/03/2009)    Patient will work on the following items until the next clinic visit to reach self-care goals:     Medications and monitoring: take my medicines every day, bring all of my medications to every visit  (02/24/2010)     Eating: drink diet soda or water instead of juice or soda, eat more vegetables, use fresh or frozen vegetables, eat foods that are low in salt, eat baked foods instead of fried foods, eat fruit for snacks and desserts, limit or avoid alcohol  (02/24/2010)     Activity: take a 30 minute walk every day  (02/24/2010)    Diabetes self-management support: CBG self-monitoring log, Written self-care plan  (02/24/2010)   Diabetes care plan printed    Lipid self-management support: Written self-care plan  (02/24/2010)   Lipid self-care plan printed.   Appended Document: A1c  10.8 %  Lab Visit  Laboratory Results   Blood Tests   Date/Time Received: February 24, 2010 2:33 PM  Date/Time  Reported: February 24, 2010 3:29 PM   HGBA1C: 10.8%   (Normal Range: Non-Diabetic - 3-6%   Control Diabetic - 6-8%)  Comments: ...............test performed by......Marland KitchenBonnie A. [place], MLS (ASCP)cm    Orders Today:

## 2010-03-04 ENCOUNTER — Encounter: Payer: Self-pay | Admitting: *Deleted

## 2010-03-09 NOTE — Progress Notes (Signed)
Summary: lab results  Phone Note Call from Patient Call back at [phone]   Reason for Call: Lab or Test Results Summary of Call: asking for lab results Initial call taken by: Knox Royalty,  February 28, 2010 1:50 PM  Follow-up for Phone Call        Called back to give results. No answer and voicemail "has not been set up yet." Results: high blood sugar, otherwise okay. Will send letter. Follow-up by: Helane Rima DO,  February 28, 2010 2:29 PM

## 2010-03-09 NOTE — Letter (Signed)
Summary: Generic Letter  [name]  [address]   [place], [place] [address]   Phone: [phone]  Fax: [phone]    02/28/2010  Hatley Ricci [address] [place], [place]  [address]  Dear Ms. Neises,  The results of your recent labs were all normal with the excpetion of a high blood sugar.   Sincerely,   Helane Rima DO  Appended Document: Generic Letter mailed

## 2010-03-22 ENCOUNTER — Emergency Department (HOSPITAL_COMMUNITY)
Admission: EM | Admit: 2010-03-22 | Discharge: 2010-03-22 | Disposition: A | Discharge status: Home / Self Care | Payer: Medicaid Other | Attending: Emergency Medicine | Admitting: Emergency Medicine

## 2010-03-22 DIAGNOSIS — X58XXXA Exposure to other specified factors, initial encounter: Secondary | ICD-10-CM | POA: Insufficient documentation

## 2010-03-22 DIAGNOSIS — E7800 Pure hypercholesterolemia, unspecified: Secondary | ICD-10-CM | POA: Insufficient documentation

## 2010-03-22 DIAGNOSIS — E119 Type 2 diabetes mellitus without complications: Secondary | ICD-10-CM | POA: Insufficient documentation

## 2010-03-22 DIAGNOSIS — Z794 Long term (current) use of insulin: Secondary | ICD-10-CM | POA: Insufficient documentation

## 2010-03-22 DIAGNOSIS — T148XXA Other injury of unspecified body region, initial encounter: Secondary | ICD-10-CM | POA: Insufficient documentation

## 2010-03-22 DIAGNOSIS — M546 Pain in thoracic spine: Secondary | ICD-10-CM | POA: Insufficient documentation

## 2010-03-22 DIAGNOSIS — M542 Cervicalgia: Secondary | ICD-10-CM | POA: Insufficient documentation

## 2010-03-25 ENCOUNTER — Telehealth: Payer: Self-pay | Admitting: Family Medicine

## 2010-03-25 NOTE — Telephone Encounter (Signed)
Pt seen at hospital & was given muscle relaxer but it makes her sick, she wants to know if MD will refill flexeril?

## 2010-03-25 NOTE — Telephone Encounter (Signed)
Unable to speak with pt. Only music playing..the patient needs office visit.

## 2010-04-19 ENCOUNTER — Ambulatory Visit (INDEPENDENT_AMBULATORY_CARE_PROVIDER_SITE_OTHER): Payer: Medicaid Other | Admitting: Family Medicine

## 2010-04-19 ENCOUNTER — Encounter: Payer: Self-pay | Admitting: Family Medicine

## 2010-04-19 ENCOUNTER — Telehealth: Payer: Self-pay | Admitting: Family Medicine

## 2010-04-19 VITALS — BP 107/69 | HR 84 | Temp 98.1°F | Ht 66.25 in | Wt 161.0 lb

## 2010-04-19 DIAGNOSIS — R350 Frequency of micturition: Secondary | ICD-10-CM

## 2010-04-19 DIAGNOSIS — E109 Type 1 diabetes mellitus without complications: Secondary | ICD-10-CM

## 2010-04-19 DIAGNOSIS — N898 Other specified noninflammatory disorders of vagina: Secondary | ICD-10-CM

## 2010-04-19 DIAGNOSIS — E119 Type 2 diabetes mellitus without complications: Secondary | ICD-10-CM

## 2010-04-19 DIAGNOSIS — Z111 Encounter for screening for respiratory tuberculosis: Secondary | ICD-10-CM

## 2010-04-19 DIAGNOSIS — R300 Dysuria: Secondary | ICD-10-CM

## 2010-04-19 DIAGNOSIS — N760 Acute vaginitis: Secondary | ICD-10-CM

## 2010-04-19 LAB — POCT URINALYSIS DIPSTICK
Ketones, UA: NEGATIVE
Protein, UA: NEGATIVE
Spec Grav, UA: 1.015
Urobilinogen, UA: 0.2
pH, UA: 7.5

## 2010-04-19 LAB — POCT WET PREP (WET MOUNT)
Trichomonas Wet Prep HPF POC: NEGATIVE
Yeast Wet Prep HPF POC: NEGATIVE

## 2010-04-19 LAB — POCT UA - MICROSCOPIC ONLY

## 2010-04-19 LAB — BASIC METABOLIC PANEL
Calcium: 9.3 mg/dL (ref 8.4–10.5)
Potassium: 4.1 mEq/L (ref 3.5–5.3)
Sodium: 139 mEq/L (ref 135–145)

## 2010-04-19 NOTE — Assessment & Plan Note (Signed)
Reviewed the importance of monitoring and controlling BG.

## 2010-04-19 NOTE — Assessment & Plan Note (Signed)
No red flags. Checking wet prep.

## 2010-04-19 NOTE — Progress Notes (Signed)
Addended by: Loralee Pacas on: 04/19/2010 05:34 PM   Modules accepted: Orders

## 2010-04-19 NOTE — Progress Notes (Signed)
Addended by: [place], Azaliyah Kennard on: 04/19/2010 12:39 PM   Modules accepted: Orders

## 2010-04-19 NOTE — Telephone Encounter (Signed)
Will fwd. To Dr.Wallace Arlyss Repress

## 2010-04-19 NOTE — Progress Notes (Signed)
  Subjective:    Patient ID: [name], female    DOB: September 07, 1980, 30 y.o.   MRN: [id number]  HPI  1. Vaginal Discharge: x several days, with odor and itch, Hx of BV/yeast, Hx DM with uncontrolled BG. No dysuria, abdominal pain, N/V/D/C, fever/chills. LMP at the end of last month. Sexually active with one partner. Does not use protection. Recent STD screening negative at [facility]. Endorses some mild back pain, urinary frequency, and dark urine.  Review of Systems SEE HPI.    Objective:   Physical Exam  Constitutional: Vital signs are normal. She appears well-developed and well-nourished. No distress.  Abdominal: Soft. Normal appearance and bowel sounds are normal. There is no tenderness. There is no CVA tenderness.  Genitourinary: No erythema, tenderness or bleeding around the vagina. Vaginal discharge found.  Neurological: She is alert.          Assessment & Plan:

## 2010-04-19 NOTE — Telephone Encounter (Signed)
Pt is needing to know results of test today - needs to know asap

## 2010-04-19 NOTE — Assessment & Plan Note (Signed)
Checking UA. Likely related to DM.

## 2010-04-20 MED ORDER — METRONIDAZOLE 500 MG PO TABS: 500.0000 mg | ORAL_TABLET | Freq: Two times a day (BID) | ORAL | Status: DC

## 2010-04-20 MED ORDER — FLUCONAZOLE 150 MG PO TABS: 150.0000 mg | ORAL_TABLET | Freq: Once | ORAL | Status: DC

## 2010-04-20 MED ORDER — CEPHALEXIN 500 MG PO CAPS: 500.0000 mg | ORAL_CAPSULE | Freq: Two times a day (BID) | ORAL | Status: AC

## 2010-04-20 NOTE — Telephone Encounter (Signed)
Called number. Answering machine said that the number had been disconnected. RESULTS: UA showing UTI and wetprep showing BV. Sending Rx Flagyl and Keflex and well as Diflucan to be used after antibiotics completed to prevent yeast infection.

## 2010-04-20 NOTE — Telephone Encounter (Signed)
Patient informed, expressed understanding. 

## 2010-04-20 NOTE — Progress Notes (Signed)
Addended by: Helane Rima on: 04/20/2010 11:11 AM   Modules accepted: Orders

## 2010-04-21 LAB — URINE CULTURE: Colony Count: >=100000

## 2010-04-23 ENCOUNTER — Emergency Department (HOSPITAL_COMMUNITY)
Admission: EM | Admit: 2010-04-23 | Discharge: 2010-04-23 | Disposition: A | Discharge status: Home / Self Care | Payer: Medicaid Other | Attending: Emergency Medicine | Admitting: Emergency Medicine

## 2010-04-23 DIAGNOSIS — R112 Nausea with vomiting, unspecified: Secondary | ICD-10-CM | POA: Insufficient documentation

## 2010-04-23 DIAGNOSIS — E860 Dehydration: Secondary | ICD-10-CM | POA: Insufficient documentation

## 2010-04-23 DIAGNOSIS — R197 Diarrhea, unspecified: Secondary | ICD-10-CM | POA: Insufficient documentation

## 2010-04-23 DIAGNOSIS — E7800 Pure hypercholesterolemia, unspecified: Secondary | ICD-10-CM | POA: Insufficient documentation

## 2010-04-23 DIAGNOSIS — K5289 Other specified noninfective gastroenteritis and colitis: Secondary | ICD-10-CM | POA: Insufficient documentation

## 2010-04-23 DIAGNOSIS — Z794 Long term (current) use of insulin: Secondary | ICD-10-CM | POA: Insufficient documentation

## 2010-04-23 DIAGNOSIS — R109 Unspecified abdominal pain: Secondary | ICD-10-CM | POA: Insufficient documentation

## 2010-04-23 DIAGNOSIS — E109 Type 1 diabetes mellitus without complications: Secondary | ICD-10-CM | POA: Insufficient documentation

## 2010-04-23 LAB — COMPREHENSIVE METABOLIC PANEL
ALT: 21 U/L (ref 0–35)
Alkaline Phosphatase: 47 U/L (ref 39–117)
CO2: 24 mEq/L (ref 19–32)
GFR calc non Af Amer: >60 mL/min (ref >60)
Glucose, Bld: 320 mg/dL — ABNORMAL HIGH (ref 70–99)
Potassium: 3.2 mEq/L — ABNORMAL LOW (ref 3.5–5.1)
Sodium: 141 mEq/L (ref 135–145)
Total Bilirubin: 0.4 mg/dL (ref 0.3–1.2)

## 2010-04-23 LAB — GLUCOSE, CAPILLARY
Glucose-Capillary: 344 mg/dL — ABNORMAL HIGH (ref 70–99)
Glucose-Capillary: 381 mg/dL — ABNORMAL HIGH (ref 70–99)
Glucose-Capillary: 330 mg/dL — ABNORMAL HIGH (ref 70–99)
Glucose-Capillary: 254 mg/dL — ABNORMAL HIGH (ref 70–99)

## 2010-04-23 LAB — URINALYSIS, ROUTINE W REFLEX MICROSCOPIC
Bilirubin Urine: NEGATIVE
Ketones, ur: 15 mg/dL — AB
Nitrite: NEGATIVE
Protein, ur: NEGATIVE mg/dL
Urobilinogen, UA: 0.2 mg/dL (ref 0.0–1.0)

## 2010-04-23 LAB — URINE MICROSCOPIC-ADD ON

## 2010-04-23 LAB — CBC
MCHC: 33.2 g/dL (ref 30.0–36.0)
Platelets: 281 10*3/uL (ref 150–400)
RDW: 14.4 % (ref 11.5–15.5)
WBC: 11.3 10*3/uL — ABNORMAL HIGH (ref 4.0–10.5)

## 2010-04-23 LAB — DIFFERENTIAL
Basophils Absolute: 0 10*3/uL (ref 0.0–0.1)
Basophils Relative: 0 % (ref 0–1)
Eosinophils Relative: 0 % (ref 0–5)
Lymphocytes Relative: 9 % — ABNORMAL LOW (ref 12–46)
Monocytes Absolute: 0.7 10*3/uL (ref 0.1–1.0)

## 2010-04-23 LAB — LIPASE, BLOOD: Lipase: 16 U/L (ref 11–59)

## 2010-04-24 ENCOUNTER — Inpatient Hospital Stay: Admit: 2010-04-24 | Payer: Self-pay

## 2010-04-24 ENCOUNTER — Encounter: Payer: Self-pay | Admitting: Family Medicine

## 2010-04-24 ENCOUNTER — Emergency Department (HOSPITAL_COMMUNITY): Payer: Medicaid Other

## 2010-04-24 ENCOUNTER — Inpatient Hospital Stay (HOSPITAL_COMMUNITY)
Admission: EM | Admit: 2010-04-24 | Discharge: 2010-04-30 | DRG: 391 | Disposition: A | Discharge status: Home / Self Care | Payer: Medicaid Other | Attending: Family Medicine | Admitting: Family Medicine

## 2010-04-24 DIAGNOSIS — R1115 Cyclical vomiting syndrome unrelated to migraine: Principal | ICD-10-CM | POA: Diagnosis present

## 2010-04-24 DIAGNOSIS — E785 Hyperlipidemia, unspecified: Secondary | ICD-10-CM

## 2010-04-24 DIAGNOSIS — F1210 Cannabis abuse, uncomplicated: Secondary | ICD-10-CM | POA: Diagnosis present

## 2010-04-24 DIAGNOSIS — E1010 Type 1 diabetes mellitus with ketoacidosis without coma: Secondary | ICD-10-CM

## 2010-04-24 DIAGNOSIS — Z8249 Family history of ischemic heart disease and other diseases of the circulatory system: Secondary | ICD-10-CM

## 2010-04-24 DIAGNOSIS — I10 Essential (primary) hypertension: Secondary | ICD-10-CM

## 2010-04-24 DIAGNOSIS — E876 Hypokalemia: Secondary | ICD-10-CM | POA: Diagnosis not present

## 2010-04-24 DIAGNOSIS — E1065 Type 1 diabetes mellitus with hyperglycemia: Secondary | ICD-10-CM | POA: Diagnosis not present

## 2010-04-24 DIAGNOSIS — Z794 Long term (current) use of insulin: Secondary | ICD-10-CM

## 2010-04-24 DIAGNOSIS — K5289 Other specified noninfective gastroenteritis and colitis: Secondary | ICD-10-CM | POA: Diagnosis present

## 2010-04-24 DIAGNOSIS — IMO0002 Reserved for concepts with insufficient information to code with codable children: Secondary | ICD-10-CM | POA: Diagnosis not present

## 2010-04-24 DIAGNOSIS — F17200 Nicotine dependence, unspecified, uncomplicated: Secondary | ICD-10-CM | POA: Diagnosis present

## 2010-04-24 DIAGNOSIS — E7800 Pure hypercholesterolemia, unspecified: Secondary | ICD-10-CM | POA: Diagnosis present

## 2010-04-24 DIAGNOSIS — Z833 Family history of diabetes mellitus: Secondary | ICD-10-CM

## 2010-04-24 LAB — COMPREHENSIVE METABOLIC PANEL
ALT: 22 U/L (ref 0–35)
AST: 33 U/L (ref 0–37)
AST: 30 U/L (ref 0–37)
Albumin: 3.8 g/dL (ref 3.5–5.2)
Alkaline Phosphatase: 67 U/L (ref 39–117)
Alkaline Phosphatase: 65 U/L (ref 39–117)
BUN: 13 mg/dL (ref 6–23)
CO2: 15 mEq/L — ABNORMAL LOW (ref 19–32)
Calcium: 9.5 mg/dL (ref 8.4–10.5)
Chloride: 104 mEq/L (ref 96–112)
GFR calc non Af Amer: >60 mL/min (ref >60)
GFR calc non Af Amer: 57 mL/min — ABNORMAL LOW (ref >60)
Potassium: 4.4 mEq/L (ref 3.5–5.1)
Potassium: 5.1 mEq/L (ref 3.5–5.1)
Total Bilirubin: 1.3 mg/dL — ABNORMAL HIGH (ref 0.3–1.2)
Total Bilirubin: 0.9 mg/dL (ref 0.3–1.2)

## 2010-04-24 LAB — GLUCOSE, CAPILLARY
Glucose-Capillary: 271 mg/dL — ABNORMAL HIGH (ref 70–99)
Glucose-Capillary: 343 mg/dL — ABNORMAL HIGH (ref 70–99)
Glucose-Capillary: 368 mg/dL — ABNORMAL HIGH (ref 70–99)

## 2010-04-24 LAB — CBC
MCH: 30.8 pg (ref 26.0–34.0)
MCV: 90.8 fL (ref 78.0–100.0)
Platelets: 322 10*3/uL (ref 150–400)
RDW: 14.7 % (ref 11.5–15.5)
WBC: 23 10*3/uL — ABNORMAL HIGH (ref 4.0–10.5)

## 2010-04-24 LAB — URINALYSIS, ROUTINE W REFLEX MICROSCOPIC
Bilirubin Urine: NEGATIVE
Ketones, ur: >80 mg/dL — AB
Leukocytes, UA: NEGATIVE
Nitrite: NEGATIVE
Protein, ur: NEGATIVE mg/dL
Urobilinogen, UA: 0.2 mg/dL (ref 0.0–1.0)

## 2010-04-24 LAB — POCT I-STAT 3, ART BLOOD GAS (G3+)
Bicarbonate: 15.1 mEq/L — ABNORMAL LOW (ref 20.0–24.0)
O2 Saturation: 68 %
pCO2 arterial: 33.2 mmHg — ABNORMAL LOW (ref 35.0–45.0)
pO2, Arterial: 40 mmHg — ABNORMAL LOW (ref 80.0–100.0)

## 2010-04-24 LAB — BASIC METABOLIC PANEL
Chloride: 113 mEq/L — ABNORMAL HIGH (ref 96–112)
GFR calc Af Amer: >60 mL/min (ref >60)
GFR calc Af Amer: >60 mL/min (ref >60)
GFR calc non Af Amer: >60 mL/min (ref >60)
GFR calc non Af Amer: >60 mL/min (ref >60)
Potassium: 4.2 mEq/L (ref 3.5–5.1)
Potassium: 4 mEq/L (ref 3.5–5.1)
Sodium: 142 mEq/L (ref 135–145)
Sodium: 142 mEq/L (ref 135–145)

## 2010-04-24 LAB — MRSA PCR SCREENING: MRSA by PCR: NEGATIVE

## 2010-04-24 LAB — WET PREP, GENITAL: Trich, Wet Prep: NONE SEEN

## 2010-04-24 LAB — DIFFERENTIAL
Eosinophils Absolute: 0 10*3/uL (ref 0.0–0.7)
Eosinophils Relative: 0 % (ref 0–5)
Lymphs Abs: 0.8 10*3/uL (ref 0.7–4.0)

## 2010-04-24 NOTE — H&P (Signed)
Family Medicine [facility] Admission History and Physical  Patient name: [name] Medical record number: [id number] Date of birth: November 09, 1980 Age: 30 y.o. Gender: female  Primary Care Provider: Helane Rima, DO  Chief Complaint: Nausea and vomiting History of Present Illness: [name] is a 30 y.o. year old female with history of Type I DM here initially with 2 days of nausea and vomiting.  Called by ED PA to admit because patient unable to tolerate po x2 days.  When arrived to ED patient was obtunded and could not answer any questions.  A Level V caveat applies due to patients altered mental status.  Per PA in ed had not received any sedating or altering meds recently and this was an acute mental status change.  Per ED notes patient has had n/v for a few days.  Was seen initially yesterday, hydrated and d/c to home.  Patient reported to ED physician hematemesis as well as blood in her stool and diffuse abdominal pain.  Recently seen at [facility] for UTI and BV, received antibiotics at that time, unable to ask patient if she took this medication.     PMH Patient Active Problem List  Diagnoses  . DIABETES-TYPE 1 dx age 30  . HYPERCHOLESTEROLEMIA  . TOBACCO USER  . TRIGGER FINGER, RIGHT THUMB  . VAGINAL DISCHARGE   Past Surgical History: BTL  Social History: History   Social History  . Marital Status: Single   Social History Main Topics  . Smoking status: Current Everyday Smoker  Smokes 1/3 ppd.  Occaional EtOH use.  No other drug use.  Has son [name] born 2002) and daughter [name] born 2005). Was in jail for selling drugs - out Oct/2010. (03/03/2009)  Family History: FHx of IDDM Allergies: Not on File  Current Outpatient Prescriptions  Medication Sig Dispense Refill  . aspirin 81 MG tablet Take 81 mg by mouth daily.        . Blood Glucose Monitoring Suppl (BD LATITUDE DIABETES SYSTEM) KIT Per instructions       . cephALEXin (KEFLEX) 500 MG capsule Take  1 capsule (500 mg total) by mouth 2 (two) times daily.  10 capsule  0  . fluconazole (DIFLUCAN) 150 MG tablet Take 1 tablet (150 mg total) by mouth once. Then again 1 week later.  2 tablet  0  . insulin NPH-insulin regular (HUMULIN 70/30) (70-30) 100 UNIT/ML injection 40 U in the am and 40 U in the pm       . Insulin Pen Needle (BD ULTRA-FINE PEN NEEDLES) 29G X 12.7MM MISC use as directed with insulin pens       . metroNIDAZOLE (FLAGYL) 500 MG tablet Take 1 tablet (500 mg total) by mouth 2 (two) times daily. For 7 days  14 tablet  0  . simvastatin (ZOCOR) 40 MG tablet Take 40 mg by mouth daily.        Review Of Systems: Per HPI with the following additions   Physical Exam: Pulse: 116  Blood Pressure: 127/78 RR: 18   O2: 97% on RA Temp: 98.3  General: Obtunded HEENT: PERRLA, oropharynx clear, no lesions and neck supple with midline trachea Heart: S1, S2 normal, no murmur, rub or Humphrey,tachycardic, regular rhythm Lungs: clear to auscultation and no wheezes or rales Abdomen: abdomen is soft with diffuse tenderness but no masses, organomegaly or guarding Extremities: extremities normal, atraumatic, no cyanosis or edema Skin:no rashes, no wounds Neurology: Obtunded, will not follow commands or answer questions.  Can  localize painful stimuli.   Labs and Imaging: Lab Results  Component Value Date/Time   NA 137 04/24/2010  3:31 PM   K 5.1 04/24/2010  3:31 PM   CL 104 04/24/2010  3:31 PM   CO2 15* 04/24/2010  3:31 PM   BUN 13 04/24/2010  3:31 PM   CREATININE 1.13 04/24/2010  3:31 PM   CREATININE 0.76 04/19/2010 12:56 PM   GLUCOSE 430* 04/24/2010  3:31 PM   Lab Results  Component Value Date   WBC 23.0* 04/24/2010   HGB 12.8 04/24/2010   HCT 37.7 04/24/2010   MCV 90.8 04/24/2010   PLT 322 04/24/2010   Arterial Blood Gas result:  pO2 40; pCO2 33.2; pH 7.266;  HCO3 15.1, %O2 Sat 68%. UA: Glucose >1000, Ketones >80 Lactic Acid: 6.5  CT Abd/Pelvis:   1. No evidence for acute abdominal or pelvic  abnormality.  2.  No evidence for acute appendicitis.  Assessment and Plan: [name] is a 30 y.o. year old female presenting with N/V/D found to be in DKA 1. DKA:  Patient obtunded at time of admission. GAP 18 this am, no fluids since this am.   Will admit to SDU and resuscitate with IVF, initially 4 L NS then 242mL/hr.  May need more boluses will monitor.  Will place on glucostabilizer per DKA order set.  Will monitor neurological status as her glucose improves and her AG closes.  Will transition to Lantus once  CBG in target range.  Will check HgbA1c to assess level of glycemic control.  Zofran for nausea control.  2. ID:  Patient with recent dx of UTI and BV, unsure if she took her medication rx'ed to her.  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  Will start coverage with zosyn and flagyl for now and narrow as needed.  Will get urine and blood cx since tachycardic with elevated WBC.  3. HLD:  Will restart simvastatin once taking po. 2. FEN/GI: NPO for now, NS @ 200cc/hr 3. Prophylaxis: Heparin SQ TID  4. Disposition: Pending clinical improvement.

## 2010-04-25 ENCOUNTER — Inpatient Hospital Stay (HOSPITAL_COMMUNITY): Payer: Medicaid Other

## 2010-04-25 LAB — GLUCOSE, CAPILLARY
Glucose-Capillary: 166 mg/dL — ABNORMAL HIGH (ref 70–99)
Glucose-Capillary: 196 mg/dL — ABNORMAL HIGH (ref 70–99)
Glucose-Capillary: 212 mg/dL — ABNORMAL HIGH (ref 70–99)
Glucose-Capillary: 193 mg/dL — ABNORMAL HIGH (ref 70–99)
Glucose-Capillary: 198 mg/dL — ABNORMAL HIGH (ref 70–99)

## 2010-04-25 LAB — BASIC METABOLIC PANEL
BUN: 5 mg/dL — ABNORMAL LOW (ref 6–23)
CO2: 24 mEq/L (ref 19–32)
GFR calc non Af Amer: >60 mL/min (ref >60)
Glucose, Bld: 179 mg/dL — ABNORMAL HIGH (ref 70–99)
Potassium: 3.3 mEq/L — ABNORMAL LOW (ref 3.5–5.1)
Sodium: 139 mEq/L (ref 135–145)

## 2010-04-25 LAB — COMPREHENSIVE METABOLIC PANEL
ALT: 17 U/L (ref 0–35)
AST: 26 U/L (ref 0–37)
Alkaline Phosphatase: 47 U/L (ref 39–117)
CO2: 20 mEq/L (ref 19–32)
GFR calc Af Amer: >60 mL/min (ref >60)
GFR calc non Af Amer: >60 mL/min (ref >60)
Glucose, Bld: 198 mg/dL — ABNORMAL HIGH (ref 70–99)
Potassium: 3.9 mEq/L (ref 3.5–5.1)
Sodium: 139 mEq/L (ref 135–145)
Total Protein: 6.3 g/dL (ref 6.0–8.3)

## 2010-04-25 LAB — CBC
HCT: 31 % — ABNORMAL LOW (ref 36.0–46.0)
Hemoglobin: 10.2 g/dL — ABNORMAL LOW (ref 12.0–15.0)
WBC: 22.3 10*3/uL — ABNORMAL HIGH (ref 4.0–10.5)

## 2010-04-25 LAB — HEMOGLOBIN A1C: Hgb A1c MFr Bld: 11.3 % — ABNORMAL HIGH (ref <5.7)

## 2010-04-26 ENCOUNTER — Inpatient Hospital Stay (HOSPITAL_COMMUNITY): Payer: Medicaid Other

## 2010-04-26 LAB — URINE CULTURE

## 2010-04-26 LAB — BASIC METABOLIC PANEL
BUN: 2 mg/dL — ABNORMAL LOW (ref 6–23)
CO2: 28 mEq/L (ref 19–32)
Calcium: 8.1 mg/dL — ABNORMAL LOW (ref 8.4–10.5)
Chloride: 103 mEq/L (ref 96–112)
Creatinine, Ser: 0.71 mg/dL (ref 0.4–1.2)
GFR calc Af Amer: >60 mL/min (ref >60)
Glucose, Bld: 204 mg/dL — ABNORMAL HIGH (ref 70–99)

## 2010-04-26 LAB — GLUCOSE, CAPILLARY
Glucose-Capillary: 205 mg/dL — ABNORMAL HIGH (ref 70–99)
Glucose-Capillary: 211 mg/dL — ABNORMAL HIGH (ref 70–99)
Glucose-Capillary: 216 mg/dL — ABNORMAL HIGH (ref 70–99)
Glucose-Capillary: 283 mg/dL — ABNORMAL HIGH (ref 70–99)

## 2010-04-26 LAB — GC/CHLAMYDIA PROBE AMP, GENITAL: GC Probe Amp, Genital: NEGATIVE

## 2010-04-26 LAB — CBC
HCT: 36.3 % (ref 36.0–46.0)
Hemoglobin: 12.7 g/dL (ref 12.0–15.0)
MCHC: 35 g/dL (ref 30.0–36.0)
RBC: 4.14 MIL/uL (ref 3.87–5.11)
WBC: 12.9 10*3/uL — ABNORMAL HIGH (ref 4.0–10.5)

## 2010-04-27 LAB — GLUCOSE, CAPILLARY: Glucose-Capillary: 334 mg/dL — ABNORMAL HIGH (ref 70–99)

## 2010-04-27 LAB — CBC
HCT: 39.3 % (ref 36.0–46.0)
MCH: 30.4 pg (ref 26.0–34.0)
MCHC: 35.1 g/dL (ref 30.0–36.0)
MCV: 86.6 fL (ref 78.0–100.0)
RDW: 14 % (ref 11.5–15.5)

## 2010-04-27 LAB — BASIC METABOLIC PANEL
BUN: <1 mg/dL — ABNORMAL LOW (ref 6–23)
CO2: 29 mEq/L (ref 19–32)
CO2: 33 mEq/L — ABNORMAL HIGH (ref 19–32)
Calcium: 8.2 mg/dL — ABNORMAL LOW (ref 8.4–10.5)
Calcium: 8.4 mg/dL (ref 8.4–10.5)
Chloride: 97 mEq/L (ref 96–112)
GFR calc Af Amer: >60 mL/min (ref >60)
GFR calc non Af Amer: >60 mL/min (ref >60)
Glucose, Bld: 175 mg/dL — ABNORMAL HIGH (ref 70–99)
Sodium: 135 mEq/L (ref 135–145)

## 2010-04-28 LAB — BASIC METABOLIC PANEL
BUN: 1 mg/dL — ABNORMAL LOW (ref 6–23)
BUN: 2 mg/dL — ABNORMAL LOW (ref 6–23)
CO2: 31 mEq/L (ref 19–32)
Chloride: 103 mEq/L (ref 96–112)
GFR calc Af Amer: >60 mL/min (ref >60)
GFR calc non Af Amer: >60 mL/min (ref >60)
GFR calc non Af Amer: >60 mL/min (ref >60)
Glucose, Bld: 105 mg/dL — ABNORMAL HIGH (ref 70–99)
Potassium: 3.1 mEq/L — ABNORMAL LOW (ref 3.5–5.1)
Potassium: 3.2 mEq/L — ABNORMAL LOW (ref 3.5–5.1)
Sodium: 139 mEq/L (ref 135–145)
Sodium: 141 mEq/L (ref 135–145)

## 2010-04-28 LAB — GLUCOSE, CAPILLARY
Glucose-Capillary: 106 mg/dL — ABNORMAL HIGH (ref 70–99)
Glucose-Capillary: 116 mg/dL — ABNORMAL HIGH (ref 70–99)
Glucose-Capillary: 117 mg/dL — ABNORMAL HIGH (ref 70–99)
Glucose-Capillary: 109 mg/dL — ABNORMAL HIGH (ref 70–99)
Glucose-Capillary: 153 mg/dL — ABNORMAL HIGH (ref 70–99)
Glucose-Capillary: 173 mg/dL — ABNORMAL HIGH (ref 70–99)

## 2010-04-28 LAB — CBC
Hemoglobin: 13.3 g/dL (ref 12.0–15.0)
MCH: 30.2 pg (ref 26.0–34.0)
Platelets: 268 10*3/uL (ref 150–400)
RBC: 4.4 MIL/uL (ref 3.87–5.11)
WBC: 7.2 10*3/uL (ref 4.0–10.5)

## 2010-04-28 LAB — PHOSPHORUS: Phosphorus: 1.2 mg/dL — ABNORMAL LOW (ref 2.3–4.6)

## 2010-04-28 LAB — MAGNESIUM: Magnesium: 1.6 mg/dL (ref 1.5–2.5)

## 2010-04-28 LAB — PREGNANCY, URINE: Preg Test, Ur: NEGATIVE

## 2010-04-29 ENCOUNTER — Inpatient Hospital Stay (HOSPITAL_COMMUNITY): Payer: Medicaid Other

## 2010-04-29 LAB — GLUCOSE, CAPILLARY
Glucose-Capillary: 235 mg/dL — ABNORMAL HIGH (ref 70–99)
Glucose-Capillary: 204 mg/dL — ABNORMAL HIGH (ref 70–99)
Glucose-Capillary: 294 mg/dL — ABNORMAL HIGH (ref 70–99)

## 2010-04-29 LAB — COMPREHENSIVE METABOLIC PANEL
ALT: 15 U/L (ref 0–35)
AST: 20 U/L (ref 0–37)
Albumin: 3 g/dL — ABNORMAL LOW (ref 3.5–5.2)
Alkaline Phosphatase: 44 U/L (ref 39–117)
Chloride: 98 mEq/L (ref 96–112)
Creatinine, Ser: 0.73 mg/dL (ref 0.4–1.2)
GFR calc Af Amer: >60 mL/min (ref >60)
Potassium: 2.7 mEq/L — CL (ref 3.5–5.1)
Sodium: 136 mEq/L (ref 135–145)
Total Bilirubin: 0.9 mg/dL (ref 0.3–1.2)

## 2010-04-29 LAB — BASIC METABOLIC PANEL
Calcium: 8.6 mg/dL (ref 8.4–10.5)
GFR calc non Af Amer: >60 mL/min (ref >60)
Glucose, Bld: 180 mg/dL — ABNORMAL HIGH (ref 70–99)
Sodium: 134 mEq/L — ABNORMAL LOW (ref 135–145)

## 2010-04-30 LAB — BASIC METABOLIC PANEL
CO2: 30 mEq/L (ref 19–32)
Calcium: 8.7 mg/dL (ref 8.4–10.5)
Glucose, Bld: 153 mg/dL — ABNORMAL HIGH (ref 70–99)
Potassium: 3.4 mEq/L — ABNORMAL LOW (ref 3.5–5.1)
Sodium: 138 mEq/L (ref 135–145)

## 2010-04-30 LAB — GLUCOSE, CAPILLARY: Glucose-Capillary: 154 mg/dL — ABNORMAL HIGH (ref 70–99)

## 2010-05-01 LAB — CULTURE, BLOOD (ROUTINE X 2)
Culture  Setup Time: 201203260837
Culture: NO GROWTH
Culture: NO GROWTH

## 2010-05-02 LAB — COMPREHENSIVE METABOLIC PANEL
ALT: 17 U/L (ref 0–35)
Alkaline Phosphatase: 61 U/L (ref 39–117)
CO2: 30 mEq/L (ref 19–32)
Calcium: 8.1 mg/dL — ABNORMAL LOW (ref 8.4–10.5)
Chloride: 108 mEq/L (ref 96–112)
GFR calc non Af Amer: >60 mL/min (ref >60)
Glucose, Bld: 121 mg/dL — ABNORMAL HIGH (ref 70–99)
Potassium: 3.5 mEq/L (ref 3.5–5.1)
Sodium: 142 mEq/L (ref 135–145)
Total Bilirubin: 0.7 mg/dL (ref 0.3–1.2)

## 2010-05-02 LAB — GLUCOSE, CAPILLARY
Glucose-Capillary: 115 mg/dL — ABNORMAL HIGH (ref 70–99)
Glucose-Capillary: 138 mg/dL — ABNORMAL HIGH (ref 70–99)
Glucose-Capillary: 155 mg/dL — ABNORMAL HIGH (ref 70–99)
Glucose-Capillary: 163 mg/dL — ABNORMAL HIGH (ref 70–99)
Glucose-Capillary: 202 mg/dL — ABNORMAL HIGH (ref 70–99)
Glucose-Capillary: 254 mg/dL — ABNORMAL HIGH (ref 70–99)
Glucose-Capillary: 274 mg/dL — ABNORMAL HIGH (ref 70–99)

## 2010-05-02 LAB — CBC
HCT: 32.3 % — ABNORMAL LOW (ref 36.0–46.0)
Hemoglobin: 10.6 g/dL — ABNORMAL LOW (ref 12.0–15.0)
Hemoglobin: 9.8 g/dL — ABNORMAL LOW (ref 12.0–15.0)
Hemoglobin: 9.8 g/dL — ABNORMAL LOW (ref 12.0–15.0)
MCHC: 34 g/dL (ref 30.0–36.0)
MCV: 89.8 fL (ref 78.0–100.0)
RBC: 3.21 MIL/uL — ABNORMAL LOW (ref 3.87–5.11)
RBC: 3.28 MIL/uL — ABNORMAL LOW (ref 3.87–5.11)
RBC: 3.59 MIL/uL — ABNORMAL LOW (ref 3.87–5.11)
RDW: 17.8 % — ABNORMAL HIGH (ref 11.5–15.5)
WBC: 12 10*3/uL — ABNORMAL HIGH (ref 4.0–10.5)

## 2010-05-02 LAB — SEDIMENTATION RATE: Sed Rate: 71 mm/hr — ABNORMAL HIGH (ref 0–22)

## 2010-05-02 LAB — BASIC METABOLIC PANEL
CO2: 27 mEq/L (ref 19–32)
Calcium: 8.2 mg/dL — ABNORMAL LOW (ref 8.4–10.5)
Chloride: 102 mEq/L (ref 96–112)
Creatinine, Ser: 0.88 mg/dL (ref 0.4–1.2)
GFR calc Af Amer: >60 mL/min (ref >60)
GFR calc Af Amer: >60 mL/min (ref >60)
GFR calc non Af Amer: >60 mL/min (ref >60)
GFR calc non Af Amer: >60 mL/min (ref >60)
Glucose, Bld: 179 mg/dL — ABNORMAL HIGH (ref 70–99)
Potassium: 2.8 mEq/L — ABNORMAL LOW (ref 3.5–5.1)
Potassium: 3.1 mEq/L — ABNORMAL LOW (ref 3.5–5.1)
Sodium: 136 mEq/L (ref 135–145)
Sodium: 140 mEq/L (ref 135–145)
Sodium: 142 mEq/L (ref 135–145)

## 2010-05-03 LAB — BASIC METABOLIC PANEL
BUN: 1 mg/dL — ABNORMAL LOW (ref 6–23)
BUN: 9 mg/dL (ref 6–23)
BUN: <1 mg/dL — ABNORMAL LOW (ref 6–23)
BUN: <1 mg/dL — ABNORMAL LOW (ref 6–23)
CO2: 22 mEq/L (ref 19–32)
CO2: 27 mEq/L (ref 19–32)
CO2: 28 mEq/L (ref 19–32)
CO2: 29 mEq/L (ref 19–32)
CO2: 31 mEq/L (ref 19–32)
Calcium: 8.2 mg/dL — ABNORMAL LOW (ref 8.4–10.5)
Calcium: 8.6 mg/dL (ref 8.4–10.5)
Calcium: 8.6 mg/dL (ref 8.4–10.5)
Chloride: 101 mEq/L (ref 96–112)
Chloride: 105 mEq/L (ref 96–112)
Chloride: 94 mEq/L — ABNORMAL LOW (ref 96–112)
Creatinine, Ser: 0.78 mg/dL (ref 0.4–1.2)
Creatinine, Ser: 0.79 mg/dL (ref 0.4–1.2)
Creatinine, Ser: 1.14 mg/dL (ref 0.4–1.2)
Creatinine, Ser: 1.14 mg/dL (ref 0.4–1.2)
GFR calc Af Amer: >60 mL/min (ref >60)
GFR calc Af Amer: >60 mL/min (ref >60)
GFR calc Af Amer: >60 mL/min (ref >60)
GFR calc Af Amer: >60 mL/min (ref >60)
GFR calc Af Amer: >60 mL/min (ref >60)
GFR calc non Af Amer: 55 mL/min — ABNORMAL LOW (ref >60)
GFR calc non Af Amer: >60 mL/min (ref >60)
Glucose, Bld: 157 mg/dL — ABNORMAL HIGH (ref 70–99)
Glucose, Bld: 197 mg/dL — ABNORMAL HIGH (ref 70–99)
Glucose, Bld: 218 mg/dL — ABNORMAL HIGH (ref 70–99)
Glucose, Bld: 245 mg/dL — ABNORMAL HIGH (ref 70–99)
Glucose, Bld: 299 mg/dL — ABNORMAL HIGH (ref 70–99)
Potassium: 2.7 mEq/L — CL (ref 3.5–5.1)
Potassium: 2.7 mEq/L — CL (ref 3.5–5.1)
Potassium: 3.8 mEq/L (ref 3.5–5.1)
Potassium: 4.2 mEq/L (ref 3.5–5.1)
Sodium: 132 mEq/L — ABNORMAL LOW (ref 135–145)
Sodium: 142 mEq/L (ref 135–145)
Sodium: 144 mEq/L (ref 135–145)

## 2010-05-03 LAB — GLUCOSE, CAPILLARY
Glucose-Capillary: 114 mg/dL — ABNORMAL HIGH (ref 70–99)
Glucose-Capillary: 143 mg/dL — ABNORMAL HIGH (ref 70–99)
Glucose-Capillary: 153 mg/dL — ABNORMAL HIGH (ref 70–99)
Glucose-Capillary: 159 mg/dL — ABNORMAL HIGH (ref 70–99)
Glucose-Capillary: 160 mg/dL — ABNORMAL HIGH (ref 70–99)
Glucose-Capillary: 168 mg/dL — ABNORMAL HIGH (ref 70–99)
Glucose-Capillary: 174 mg/dL — ABNORMAL HIGH (ref 70–99)
Glucose-Capillary: 177 mg/dL — ABNORMAL HIGH (ref 70–99)
Glucose-Capillary: 190 mg/dL — ABNORMAL HIGH (ref 70–99)
Glucose-Capillary: 193 mg/dL — ABNORMAL HIGH (ref 70–99)
Glucose-Capillary: 234 mg/dL — ABNORMAL HIGH (ref 70–99)
Glucose-Capillary: 263 mg/dL — ABNORMAL HIGH (ref 70–99)
Glucose-Capillary: 265 mg/dL — ABNORMAL HIGH (ref 70–99)
Glucose-Capillary: 294 mg/dL — ABNORMAL HIGH (ref 70–99)
Glucose-Capillary: 83 mg/dL (ref 70–99)
Glucose-Capillary: 84 mg/dL (ref 70–99)

## 2010-05-03 LAB — COMPREHENSIVE METABOLIC PANEL
ALT: 13 U/L (ref 0–35)
ALT: 21 U/L (ref 0–35)
BUN: 22 mg/dL (ref 6–23)
BUN: 5 mg/dL — ABNORMAL LOW (ref 6–23)
CO2: 19 mEq/L (ref 19–32)
Calcium: 8.1 mg/dL — ABNORMAL LOW (ref 8.4–10.5)
Calcium: 9.3 mg/dL (ref 8.4–10.5)
Creatinine, Ser: 1 mg/dL (ref 0.4–1.2)
Creatinine, Ser: 1.55 mg/dL — ABNORMAL HIGH (ref 0.4–1.2)
GFR calc non Af Amer: 40 mL/min — ABNORMAL LOW (ref >60)
GFR calc non Af Amer: >60 mL/min (ref >60)
Glucose, Bld: 183 mg/dL — ABNORMAL HIGH (ref 70–99)
Glucose, Bld: 458 mg/dL — ABNORMAL HIGH (ref 70–99)
Sodium: 138 mEq/L (ref 135–145)
Total Protein: 6.7 g/dL (ref 6.0–8.3)

## 2010-05-03 LAB — CBC
HCT: 30.9 % — ABNORMAL LOW (ref 36.0–46.0)
HCT: 31.1 % — ABNORMAL LOW (ref 36.0–46.0)
HCT: 34.8 % — ABNORMAL LOW (ref 36.0–46.0)
HCT: 37.9 % (ref 36.0–46.0)
HCT: 39.8 % (ref 36.0–46.0)
Hemoglobin: 10.7 g/dL — ABNORMAL LOW (ref 12.0–15.0)
Hemoglobin: 10.7 g/dL — ABNORMAL LOW (ref 12.0–15.0)
Hemoglobin: 12.7 g/dL (ref 12.0–15.0)
Hemoglobin: 13.2 g/dL (ref 12.0–15.0)
MCHC: 32.8 g/dL (ref 30.0–36.0)
MCHC: 33 g/dL (ref 30.0–36.0)
MCHC: 33.2 g/dL (ref 30.0–36.0)
MCHC: 33.4 g/dL (ref 30.0–36.0)
MCHC: 34.3 g/dL (ref 30.0–36.0)
MCV: 88.7 fL (ref 78.0–100.0)
MCV: 89.7 fL (ref 78.0–100.0)
MCV: 89.8 fL (ref 78.0–100.0)
MCV: 90.5 fL (ref 78.0–100.0)
Platelets: 232 10*3/uL (ref 150–400)
Platelets: 325 10*3/uL (ref 150–400)
RBC: 3.5 MIL/uL — ABNORMAL LOW (ref 3.87–5.11)
RBC: 3.69 MIL/uL — ABNORMAL LOW (ref 3.87–5.11)
RBC: 4.23 MIL/uL (ref 3.87–5.11)
RBC: 4.39 MIL/uL (ref 3.87–5.11)
RDW: 16.4 % — ABNORMAL HIGH (ref 11.5–15.5)
RDW: 17.1 % — ABNORMAL HIGH (ref 11.5–15.5)
RDW: 17.2 % — ABNORMAL HIGH (ref 11.5–15.5)
RDW: 17.8 % — ABNORMAL HIGH (ref 11.5–15.5)
WBC: 12 10*3/uL — ABNORMAL HIGH (ref 4.0–10.5)
WBC: 19.8 10*3/uL — ABNORMAL HIGH (ref 4.0–10.5)

## 2010-05-03 LAB — DIFFERENTIAL
Basophils Absolute: 0 10*3/uL (ref 0.0–0.1)
Basophils Relative: 0 % (ref 0–1)
Eosinophils Absolute: 0 10*3/uL (ref 0.0–0.7)
Eosinophils Relative: 0 % (ref 0–5)
Eosinophils Relative: 0 % (ref 0–5)
Lymphocytes Relative: 2 % — ABNORMAL LOW (ref 12–46)
Metamyelocytes Relative: 0 %
Monocytes Absolute: 0.6 10*3/uL (ref 0.1–1.0)
Monocytes Absolute: 2.1 10*3/uL — ABNORMAL HIGH (ref 0.1–1.0)
Monocytes Relative: 10 % (ref 3–12)
Monocytes Relative: 3 % (ref 3–12)
Myelocytes: 0 %
Neutrophils Relative %: 88 % — ABNORMAL HIGH (ref 43–77)
Smear Review: ADEQUATE
WBC Morphology: INCREASED
nRBC: 0 /100 WBC

## 2010-05-03 LAB — URINALYSIS, ROUTINE W REFLEX MICROSCOPIC
Bilirubin Urine: NEGATIVE
Glucose, UA: >1000 mg/dL — AB
Glucose, UA: >1000 mg/dL — AB
Ketones, ur: 40 mg/dL — AB
Leukocytes, UA: NEGATIVE
Leukocytes, UA: NEGATIVE
Protein, ur: 30 mg/dL — AB
pH: 5.5 (ref 5.0–8.0)
pH: 6 (ref 5.0–8.0)

## 2010-05-03 LAB — URINE CULTURE: Colony Count: >=100000

## 2010-05-03 LAB — URINE MICROSCOPIC-ADD ON

## 2010-05-03 LAB — POCT I-STAT, CHEM 8
Calcium, Ion: 1.01 mmol/L — ABNORMAL LOW (ref 1.12–1.32)
Chloride: 102 mEq/L (ref 96–112)
HCT: 43 % (ref 36.0–46.0)

## 2010-05-03 LAB — PREGNANCY, URINE: Preg Test, Ur: NEGATIVE

## 2010-05-12 NOTE — Discharge Summary (Addendum)
NAME:  [name], [name]              ACCOUNT NO.:  [id number]  MEDICAL RECORD NO.:  [id number]           PATIENT TYPE:  I  LOCATION:  3311                         FACILITY:  [facility]  PHYSICIAN:  Paula Compton, MD        DATE OF BIRTH:  [birth year]  DATE OF ADMISSION:  04/24/2010 DATE OF DISCHARGE:                              DISCHARGE SUMMARY   PRIMARY CARE PROVIDER:  Helane Rima, MD at the [facility].  DISCHARGE DIAGNOSES: 1. Persistent nausea, vomiting, likely secondary to prolonged     gastroenteritis versus uncontrolled diabetes. 2. Type 1 diabetes mellitus. 3. Hypertension.  DISCHARGE MEDICATIONS: 1. Mucinex 200 mg 1-2 tablets every 4 h. as needed by mouth for cough     and congestion. 2. Lisinopril 2.5 mg 1 tablet by mouth daily. 3. Reglan 5 mg 1 tablet by mouth 3 times a day with meals. 4. Phenergan 12.5 mg 1 tablet by mouth every 6 h. as needed for nausea     or vomiting, hold for excessive drowsiness. 5. Compazine 10 mg 1 tablet by mouth every 8 h. as needed for nausea,     vomiting. 6. Carafate 1 g 1 tablet by mouth 4 times daily. 7. Zofran 4 mg dissolvable tablets 1 tablet by mouth every 6 h. as     needed for nausea, vomiting. 8. Humulin 730 30 units subcu twice daily.  CONSULTS:  None at this time.  PROCEDURES:   April 25, 2010, a chest x-ray:   1. Mild bronchitic-type lung changes, no infiltrates, edema or effusions.  April 24, 2010, CT abdomen and pelvis with contrast: 1. No evidence for acute abdominal or pelvic abnormality. 2. No evidence for acute appendicitis.  April 26, 2010, chest KUB:  No     bowel obstruction, some residual contrast.  PERTINENT LABORATORY DATA:  At discharge, urine pregnancy negative.  TSH 2.25, magnesium 2.3, phosphorus 1.9.  BMET at discharge; sodium 138, potassium 3.4, chloride 101, CO2 30, BUN less than 1, creatinine 0.79, glucose 153.  Blood cultures were negative x2.  GC and Chlamydia were negative.   Urine culture insignificant growth.  A1c 11.3.  Lactic acid 6.5.  Wet prep negative.  BRIEF HOSPITAL COURSE:  Ms. Hurless is a 30 year old female with a history of type 1 diabetes who presented with a 2-day history of nausea and vomiting.  Initially, the admitting physicians were called by the emergency department PA to see the patient because she was unable to tolerate orals for 2 days.  On arrival to the ED, the patient was obtunded and could not answer any questions.  Level V caveat applied due to the patient's altered mental status.  The patient was recently seen at the clinic for a UTI and bacterial vaginosis and received antibiotics at that time.  Per ED notes, the patient has had nausea, vomiting for the last few days.  The patient was found to be in diabetic ketoacidosis with an anion gap of 18. 1. Diabetic ketoacidosis.  Anion gap was on admission was 18.  Patient was admitted     to Step-Down Unit and resuscitated  with the IV fluids.  The     patient received about 5 L of normal saline at 200 mL per hour.     The patient was placed on glucose stabilizer per DKA order set.     On hospital day 2, the patient's neurologic status was back to     baseline.  She was no longer obtunded.  The patient's gap closed.     She was transitioned from Glucommander to Lantus 15 units daily.     Hemoglobin A1c as above.  The patient's CBGs were elevated     throughout the hospital course ranging from 200-250.  Her Lantus     was increased to 20 units subcu daily and this seemed to help     control her CBGs.  However, towards the end of her hospital stay,     she did drop below 60.  Therefore, the patient will be discharged     on her home regimen of Humulin 70/30 except we reduced it to 30     units subcu twice daily instead of 36.  The patient is to follow up     with her PCP in one week to better manage the patient's diabetes.     The patient does have a history of noncompliance. 2. Persistent  nausea, vomiting.  The patient continued to feel     nauseous and vomited even after the DKA resolved.  The patient was     started on Zofran IV, Phenergan IV and Ativan IV.  Despite     scheduling antiemetics, the patient continued to vomit.  Urine     pregnancy test was ordered which was negative.  TSH was ordered     which was normal.  We continued to monitor her potassium and mag     and phos.  We continued to replete potassium and we were finally     able to bring it to a normal range at discharge.  At the time of     discharge, the patient's diet was advanced as tolerated.  She was     able to tolerate clears by the end throughout the summer in the     middle of her hospital visit and then she was able to tolerate soft     mechanical diet at the time of discharge.  On the day of discharge,     the patient stated that she had no longer felt nauseous or vomited     in the last 24 hours and she had not received any Compazine p.r.n.     since for over 48 hours.  The patient will be discharged on     Phenergan or p.o. p.r.n. and Zofran dissolvable tablets and     Compazine p.r.n. for nausea, vomiting.  The patient will also be     discharged on Reglan due to possible gastroparesis. 3. Elevated blood pressures.  The patient's blood pressures ranged     between 110s to 180s systolic over 80s to 100s diastolic.  This was     likely secondary to the nausea and vomiting and     diffuse abdominal pain.  The patient was started on a low-dose     lisinopril 2.5 mg daily due to her diabetes and likely the     patient's blood pressures will continue to stabilize now that she     is feeling better.  Please follow up blood pressures outpatient. 4. Infectious disease.  The patient did  have leukocytosis at the time     of admission.  She also spiked a fever of 100 in the emergency     department.  On admission, the patient was started on Zosyn IV and     Flagyl IV to treat for the previous UTI and  bacterial vaginosis on     hospital day 2.  Zosyn was discontinued and ampicillin was started.     On hospital day 3, blood cultures and urine cultures came back     negative and all antibiotics were discontinued.  The patient's     glucose had resolved and the patient remained afebrile throughout     the hospital course.  DISCHARGE INSTRUCTIONS:  Activity, no restrictions.  DIET:  Carb-modified diabetic diet.  WOUND CARE:  Not applicable.  FOLLOWUP APPOINTMENTS:  Dr. Earlene Plater at the [facility].  The patient is to call and schedule an appointment within the next week.  SPECIAL INSTRUCTIONS:  If you experience persistent nausea, vomiting that was not relieved by Zofran or Phenergan or Compazine, please call your PCP at [phone] or come to the emergency department.  DISCHARGE CONDITION:  The patient was discharged home in stable medical condition.    ______________________________ Barnabas Lister, MD   ______________________________ Paula Compton, MD    ID/MEDQ  D:  05/01/2010  T:  05/01/2010  Job:  [id number]  Electronically Signed by Barnabas Lister MD on 05/12/2010 02:47:39 PM Electronically Signed by Paula Compton MD on 05/16/2010 11:14:37 AM

## 2010-05-16 NOTE — H&P (Signed)
NAME:  Rachel, Humphrey              ACCOUNT NO.:  [id number]  MEDICAL RECORD NO.:  [id number]           PATIENT TYPE:  I  LOCATION:  3311                         FACILITY:  [facility]  PHYSICIAN:  Paula Compton, MD        DATE OF BIRTH:  12-16-1980  DATE OF ADMISSION:  04/24/2010 DATE OF DISCHARGE:                             HISTORY & PHYSICAL   PRIMARY CARE PROVIDER:  Helane Rima, MD at [facility].  CHIEF COMPLAINT:  Nausea and vomiting.  HISTORY OF PRESENT ILLNESS:  [name] is a 30 year old female with history of type 1 diabetes mellitus initially with 2 days of nausea and vomiting.  Initially, called by the emergency department PA to come in with this patient because she has been unable to tolerate p.o. x2 days. On arrival to ED, the patient was obtunded and cannot answer any questions.  Level 5 caveat applied due to the patient's altered mental status.  Per PA in the emergency department, had not received any sedating or altering meds recently, this is an acute mental status change.  Per ED notes, the patient has had nausea, vomiting for few days.  Was seen initially yesterday hydrated and discharged to home. The patient reported to ED physician hematemesis as well as blood in her stool and diffuse abdominal pain.  Recently, seen at [facility] for UTI and bacterial vaginosis, received antibiotics at that time, unable to ask the patient, she took this medication.  PAST MEDICAL HISTORY: 1. Type 1 diabetes mellitus, diagnosed at age 65. 2. Hypercholesterolemia. 3. Tobacco abuse. 4. History of trigger finger in right thumb. 5. Recent urinary tract infection and vaginal discharge.  PAST SURGICAL HISTORY:  BTL.  SOCIAL HISTORY:  Smokes 1/3 pack per day, occasional alcohol use.  No other drug use.  Has son and daughter.  FAMILY HISTORY:  Family history of insulin-dependent diabetes.  ALLERGIES:  No known drug  allergies.  MEDICATIONS: 1. Aspirin 81 mg p.o. daily. 2. Keflex 500 mg p.o. b.i.d. 3. Diflucan 150 mg x1. 4. Insulin NPH/insulin regular Humulin 70/30, 40 units in the a.m. and     40 units in the p.m. 5. Flagyl 100 mg one tablet p.o. b.i.d. 6. Simvastatin 40 mg p.o. daily.  REVIEW OF SYSTEMS:  Unable to perform complete review of systems due to level 5 caveat.  PHYSICAL EXAMINATION:  VITAL SIGNS:  Heart rate 116, blood pressure 127/78, respiration 18, O2 97% on room air, temperature 98.3. GENERAL:  Obtunded. HEENT:  Pupils equal, round, and reactive to light.  Oropharynx is clear.  No lesions. NECK:  Supple with midline trachea. HEART:  Normal S1 and S2.  No murmur, rub, or gallop.  However, was tachycardiac with regular rhythm. LUNGS:  Clear to auscultation bilaterally. ABDOMEN:  Soft with diffuse tenderness and no masses, organomegaly, or guarding. EXTREMITIES:  Normal atraumatic with no cyanosis or edema. SKIN:  No rashes or lesions. NEUROLOGY:  Obtunded.  Will not follow commands or answer questions. Can localize painful stimuli. LABS AND IMAGING:  Sodium 137, potassium 5.1, chloride 104, bicarb 15, BUN 13, creatinine 1.13, glucose 430.  WBC is 23, hemoglobin 12.8, hematocrit 37.7, platelets 322.  ABG with pO2 of 40, pCO2 of 33.2, pH of 7.266, bicarb of 15.1, and 68% O2 saturation.  UA with glucose greater than 1000, greater than 80 ketones, lactic acid 6.5.  CT abdomen and pelvis, impression: 1. No evidence for acute abdominal or pelvic abnormality. 2. No evidence for acute appendicitis.  ASSESSMENT AND PLAN:  [name] is a 30 year old female presenting with nausea, vomiting, diarrhea, found to be in diabetic ketoacidosis. 1. Diabetic ketoacidosis.  Patient obtunded at the time of admission.     Gap 18 this morning.  No fluids since this morning.  At the time of     admission, gap was still same.  However, her CO2 level that come     down significantly.  We will  admit to step-down unit and     resuscitate with intravenous fluids initially, we will start with 4     L of normal saline at 200 mL per hour.  May need more boluses, will     monitor.  We will place on glucose stabilizer per diabetic     ketoacidosis order set.  We will monitor neurological status since     her glucose improves and anion gap closes.  We will transition to     Lantus once CBG at target range.  We will check hemoglobin A1c to     assess level of glycemic control.  We will use Zofran for nausea     control. 2. Infectious disease.  The patient's recent diagnosis of UTI and     bacterial vaginosis.  Not sure she took her medications as     prescribed.  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  We will start coverage with Zosyn and Flagyl for now     and narrow as needed.  We will get urine and blood culture since     tachycardic with elevated WBC. 3. Hyperlipidemia.  We will restart simvastatin and was taking p.o. 4. Fluids, electrolytes, nutrition/gastrointestinal.  N.p.o. for now     normal saline at 200 mL now. 5. Prophylaxis.  Heparin subcu t.i.d. 6. Disposition pending clinical improvement.   ______________________________ Everrett Coombe, MD   ______________________________ Paula Compton, MD   CM/MEDQ  D:  04/24/2010  T:  04/25/2010  Job:  [id number]  Electronically Signed by Everrett Coombe MD on 05/03/2010 04:54:09 PM Electronically Signed by Paula Compton MD on 05/16/2010 11:14:39 AM

## 2010-05-17 ENCOUNTER — Inpatient Hospital Stay: Payer: Medicaid Other | Admitting: Family Medicine

## 2010-05-24 ENCOUNTER — Ambulatory Visit (INDEPENDENT_AMBULATORY_CARE_PROVIDER_SITE_OTHER): Payer: Medicaid Other | Admitting: Family Medicine

## 2010-05-24 ENCOUNTER — Encounter: Payer: Self-pay | Admitting: Family Medicine

## 2010-05-24 DIAGNOSIS — R300 Dysuria: Secondary | ICD-10-CM

## 2010-05-24 DIAGNOSIS — E109 Type 1 diabetes mellitus without complications: Secondary | ICD-10-CM

## 2010-05-24 DIAGNOSIS — I809 Phlebitis and thrombophlebitis of unspecified site: Secondary | ICD-10-CM

## 2010-05-24 DIAGNOSIS — R350 Frequency of micturition: Secondary | ICD-10-CM

## 2010-05-24 DIAGNOSIS — N898 Other specified noninflammatory disorders of vagina: Secondary | ICD-10-CM

## 2010-05-24 DIAGNOSIS — I808 Phlebitis and thrombophlebitis of other sites: Secondary | ICD-10-CM

## 2010-05-24 LAB — POCT URINALYSIS DIPSTICK
Nitrite, UA: NEGATIVE
Urobilinogen, UA: 0.2
pH, UA: 6.5

## 2010-05-24 LAB — POCT WET PREP (WET MOUNT)
Clue Cells Wet Prep HPF POC: NEGATIVE
Trichomonas Wet Prep HPF POC: NEGATIVE
Yeast Wet Prep HPF POC: NEGATIVE

## 2010-05-24 MED ORDER — INSULIN GLARGINE 100 UNIT/ML ~~LOC~~ SOLN: SUBCUTANEOUS | Status: DC

## 2010-05-24 MED ORDER — IBUPROFEN 600 MG PO TABS: 600.0000 mg | ORAL_TABLET | Freq: Four times a day (QID) | ORAL | Status: AC | PRN

## 2010-05-24 MED ORDER — INSULIN PEN NEEDLE 29G X 12.7MM MISC: 1.0000 | Freq: Three times a day (TID) | Status: DC

## 2010-05-24 NOTE — Patient Instructions (Signed)
It was nice to see you today!  Follow up in 3 weeks.  Please check your blood sugar at least every morning and at least after one meal every day. Bring a log for [place] to review.

## 2010-05-25 ENCOUNTER — Telehealth: Payer: Self-pay | Admitting: *Deleted

## 2010-05-25 ENCOUNTER — Encounter: Payer: Self-pay | Admitting: Family Medicine

## 2010-05-25 DIAGNOSIS — I808 Phlebitis and thrombophlebitis of other sites: Secondary | ICD-10-CM | POA: Insufficient documentation

## 2010-05-25 MED ORDER — CEPHALEXIN 500 MG PO CAPS: 500.0000 mg | ORAL_CAPSULE | Freq: Two times a day (BID) | ORAL | Status: AC

## 2010-05-25 NOTE — Telephone Encounter (Signed)
Message copied by Garen Grams on Wed May 25, 2010  9:38 AM ------      Message from: Helane Rima      Created: Wed May 25, 2010  8:53 AM       Please let patient know that wetprep showed no infection - only lots of sperm. UA showed infection - I sent in Keflex. Thanks!

## 2010-05-25 NOTE — Assessment & Plan Note (Signed)
Evidence of UTI. Sending for culture. Will treat with Keflex.

## 2010-05-25 NOTE — Assessment & Plan Note (Signed)
Negative for infection. Lots of sperm in sample.

## 2010-05-25 NOTE — Progress Notes (Signed)
  Subjective:    Patient ID: [name], female    DOB: July 16, 1980, 30 y.o.   MRN: [id number]  HPI  1. DM: Dx at age 65. Noncompliant. Rx 70/30 - 32 units in the am and 32 units in the pm. Sometimes forgets to take insulin. Does not check BG. Sometimes "feels low" in the middle of the night, gets up, and drinks juice. No numbers to review today. Recent hospital admission for N/V associated with hyperglycemia. Denies fever/chills, HA, dizziness, N/V/D/C, LE edema, change in vision, numbness/tingling in her hands/feet. Has eye appointment early May.  2. Vaginal DC: White, thick, itchy. Not concerned for STDs today.  3. Urinary Frequency: x several days. Associated with suprapubic pain and low back pain. No dysuria.  4. Arm Pain: At previous IV site.  Review of Systems SEE HPI.   Objective:   Physical Exam  Constitutional: She appears well-developed and well-nourished. No distress.  Cardiovascular: Normal rate, regular rhythm, normal heart sounds and intact distal pulses.        Right Arm: Thrombophlebitis of superficial vein medially. Normal distal pulses. Normal sensation and ROM.  Pulmonary/Chest: Effort normal and breath sounds normal.  Abdominal: Soft. Bowel sounds are normal. She exhibits no mass. There is tenderness. There is no rebound and no guarding.       Suprapubic TTP.  Genitourinary: Uterus normal. Vaginal discharge found.       Thin white vaginal DC. No cervical DC or friability.      Assessment & Plan:

## 2010-05-25 NOTE — Telephone Encounter (Signed)
Informed patient of message from MD, she wants to know if MD will still prescribe her something for yeast to take after she finishes the Keflex.[name]

## 2010-05-25 NOTE — Assessment & Plan Note (Signed)
Reviewed importance of glucose control. Patient to follow up in 4-6 weeks with me and with Arlys John [facility] [employer]). Short-Term Goal - check BG every am. Ultimate Goal - check BG QID. Changed insulin to Lantus for better compliance. Patient understands that this will need to be increased and that meal-time coverage will be needed later.

## 2010-05-25 NOTE — Telephone Encounter (Signed)
Tried calling patient to inform her of results, no answer and unable to leave message, will try again later.

## 2010-05-25 NOTE — Assessment & Plan Note (Signed)
Discussed Dx, Tx, and precautions. Rx Motrin, warm compresses.

## 2010-05-26 ENCOUNTER — Other Ambulatory Visit: Payer: Self-pay | Admitting: Family Medicine

## 2010-05-26 LAB — URINE CULTURE: Colony Count: >=100000

## 2010-05-26 MED ORDER — FLUCONAZOLE 150 MG PO TABS: 150.0000 mg | ORAL_TABLET | Freq: Once | ORAL | Status: DC

## 2010-05-26 NOTE — Telephone Encounter (Signed)
Diflucan called in.

## 2010-05-26 NOTE — Telephone Encounter (Signed)
Patient informed, expressed understanding. 

## 2010-06-17 NOTE — H&P (Signed)
NAMEMELANYE, Humphrey              ACCOUNT NO.:  [id number]   MEDICAL RECORD NO.:  [id number]          PATIENT TYPE:  INP   LOCATION:  1824                         FACILITY:  [facility]   PHYSICIAN:  Raynelle Fanning A. Mayford Humphrey, M.D.DATE OF BIRTH:  [birth year]   DATE OF ADMISSION:  08/07/2005  DATE OF DISCHARGE:                                HISTORY & PHYSICAL   CHIEF COMPLAINT:  Abdominal pain.   HISTORY OF PRESENT ILLNESS:  The patient is a 30 year old noncompliant  diabetic African-American female that is able to give a history but refuses  to do so.  She says that, I've been sick for about 3 days.  She goes on to  say, My back hurts and so does my chest and my stomach.  She does not give  much history in addition to this.  She does deny bilious and bloody vomitus.  She denies bloody diarrhea.  She denies fevers and chills.  She does admit  noncompliance with insulin.  She says, I haven't had any in about 3 days.  She refuses to admit if she began feeling ill and then stopped taking the  insulin or if she stopped the insulin first.  In any event, she came to the  emergency room this morning because she was feeling progressively worse.   The patient does have a 15-year history of diabetes but with noted  noncompliance.  Her last A1c was 14.   REVIEW OF SYSTEMS:  Otherwise unable to obtain.   PAST MEDICAL HISTORY:  1.  Diabetes type 1.  2.  History of superficial infections, including folliculitis and      Gardnerella.  3.  The patient has a history of hypercholesterolemia.  4.  The patient also has a history of wrist pain.  5.  Note the patient has multiple admissions for abdominal pain and      dehydration.   FAMILY HISTORY:  Her aunt does have insulin-dependent diabetes mellitus.  Grandmother had a heart attack.  There are multiple members of the family  that have hypertension.   MEDICATIONS:  Aspirin, insulin and Pravachol.   ALLERGIES:  No known drug allergies.   SOCIAL HISTORY:   She quit smoking tobacco in 2000.  She uses alcohol  occasionally.  There is no other drug use.  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   PHYSICAL EXAMINATION:  VITAL SIGNS:  Vital signs noted for a temperature of  97, pulse 127, blood pressure 149/97, respiratory rate 24, SPO2 100% on room  air.  GENERAL:  The patient is alert and oriented.  She is in no respiratory  distress, although she is ill-appearing.  Mental status is as above.  HEENT:  Head normocephalic, atraumatic.  Extraocular movements intact.  Mouth and throat:  Mucous membranes are dry.  CHEST:  Nontender to palpation.  LUNGS:  Clear to auscultation bilaterally.  CARDIAC:  Tachycardic with an S1 and S2 that are normal.  No murmurs  appreciated.  ABDOMEN:  Soft.  She has positive bowel sounds and no rebound.  She is  slightly diffusely tender.  EXTREMITIES:  No edema.  PULSES:  2+ dorsalis pedis pulses bilaterally.  RECTAL:  Deferred.  NEUROLOGIC:  Grossly intact.  SKIN:  No rash.  NECK:  No cervical lymphadenopathy appreciated.   LABORATORY DATA:  UA noted for specific gravity 1.027 with a pH of 5,  glucose greater than 1000 and ketones greater than 80, nitrite and leukocyte  esterase were both negative.  I-STAT electrolytes are noted for the  following abnormalities:  Potassium 3.1, bicarb of 7, glucose of 478, but  this sample was hemolyzed.  Repeat potassium is pending.  The pH on this I-  STAT was 7.027 with a PCO2 of 26.6.  EKG showed tachycardia with peaked T-  waves.   ASSESSMENT AND PLAN:  The patient is a 30 year old female with the following  problems:   1.  Diabetic ketoacidosis with hyperkalemia.  The patient was given 500 mg      of calcium gluconate along with 1 amp bicarb in the ED.  She is being      bolused with normal saline and being placed on the Glucommander.  A      repeat potassium is pending.  We will adjust her IV fluids based on her      hydration status and the follow-up electrolytes.  The  inpatient team is      aware of her pending admission.  The differential for this overall      constellation of symptoms includes the following:  Noncompliance versus      acute gastroenteritis versus possible ischemia.  Given this      differential, will treat her as above.  Will check a chest x-ray and      rule her out with cardiac enzymes and give her supportive care.  2.  Diet.  N.P.O. except for ice chips.  Diet to be adjusted when the      patient's nausea is under better control.  3.  Prophylaxis.  SCDs and ambulate ASAP.  We will place the patient on      Protonix when she is taking p.o. reliably.  4.  Nausea and vomiting.  We will give her Reglan now.  Note the patient      does have a history of diabetes with significant noncompliance.  She      could have a possible component of gastroparesis, but this would be an      atypical presentation.  5.  Code status.  Full.  6.  Admit.  7.  Follow up labs, fasting lipid panel tomorrow morning.      Dwana Curd Para March, M.D.    ______________________________  [name], M.D.    GSD/MEDQ  D:  08/07/2005  T:  08/07/2005  Job:  [id number]   cc:   Levander Campion, M.D.  Fax: [phone]

## 2010-06-17 NOTE — Group Therapy Note (Signed)
NAME:  [name], RABBANI                        ACCOUNT NO.:  [id number]   MEDICAL RECORD NO.:  [id number]                   PATIENT TYPE:  OUT   LOCATION:  [facility]                           FACILITY:  [facility]   PHYSICIAN:  Tinnie Gens, MD                     DATE OF BIRTH:  1980/07/30   DATE OF SERVICE:  08/13/2003                                    CLINIC NOTE   CHIEF COMPLAINT:  Postpartum checkup.   HISTORY OF PRESENT ILLNESS:  The patient is a 30 year old gravida 2 para 2  who is 6 weeks postpartum.  She underwent a cesarean section and BTL for a  previous C-section.  She reports that she is doing well, that the baby is  doing well, the baby just had a cold.  She has no feelings of postpartum  depression.  She is bottle feeding.  Her diabetes is being controlled by Dr.  Dahlia Byes although she has two __________ for low sugar in the past week.  The patient does not know if she has had a period since she had the baby but  she has had some dark brown bloody discharge after intercourse x2.   PHYSICAL EXAMINATION:  Shows normal external female genitalia.  There is  some old blood in the vault.  The cervix is without lesion.  The vagina is  without lesion.  The uterus is small and involuted.   IMPRESSION:  1. Six-week postpartum check.  2. Diabetes.   PLAN:  1. I have urged her that she will probably began to resume normal     menstruation in the next couple of weeks.  2. Advised her to return to Dr. Georgina Peer for her diabetic care.                                               Tinnie Gens, MD    TP/MEDQ  D:  08/13/2003  T:  08/13/2003  Job:  [id number]

## 2010-06-17 NOTE — H&P (Signed)
NAMECINNAMON, MORENCY              ACCOUNT NO.:  [id number]   MEDICAL RECORD NO.:  [id number]          PATIENT TYPE:  INP   LOCATION:  3025                         FACILITY:  [facility]   PHYSICIAN:  Asencion Partridge, M.D.     DATE OF BIRTH:  25-Mar-1980   DATE OF ADMISSION:  01/17/2005  DATE OF DISCHARGE:                                HISTORY & PHYSICAL   CHIEF COMPLAINT:  Nausea, vomiting, and dehydration.   HISTORY OF PRESENT ILLNESS:  The patient ran out of insulin yesterday  morning and began vomiting today earlier this morning.  The patient states  she vomited approximately 5 times prior to coming to the ED.  The patient  also complains of total body aches.  The patient notes a similar yet more  mild episode a few months ago.  The patient also complains of extreme  thirst, polyuria, and shortness of breath.   REVIEW OF SYSTEMS:  CONSTITUTIONAL:  Positive for chills and subjective  fevers.  CARDIOVASCULAR: Negative for chest pain. PULMONARY: Shortness of  breath. GI: Nausea and vomiting. GU: No dysuria.  Also notes headache and  sore throat.  Remainder of Review of Systems is unremarkable.   PAST MEDICAL HISTORY:  1.  Diabetes mellitus, type 1.  2.  Folliculitis.  3.  Hypercholesterolemia.  4.  Lollie Sails tenosynovitis.   MEDICATIONS:  1.  Lantus 30 units q.a.m.  2.  The patient occasionally takes Lantus every night depending on her      sugars.  3.  NovoLog 15 units in the morning, 15 units at night.  4.  She no longer takes Lipitor secondary to nausea.   ALLERGIES:  No known drug allergies.   PAST SURGICAL HISTORY:  Bilateral tubal ligation following her second  pregnancy in May 2005.   FAMILY HISTORY:  Insulin-dependent diabetes, coronary artery disease,  hypertension, and stomach cancer.   SOCIAL HISTORY:  The patient smokes 5 cigarettes per day, occasional alcohol  use.  No other drug use. Has a son who was born in 2004 and a daughter who  was born 87.  She worked  as a Administrator, [employer] at [employer].  Her  daughter recently had RSV, and her son has asthma.   PHYSICAL EXAMINATION:  VITAL SIGNS:  Temperature 98.5, pulse 93,  respirations 18, blood pressure 132/78, and she was saturating 98% on room  air.  GENERAL APPEARANCE:  No acute distress.  MENTAL STATUS:  Awake, alert, and oriented.  HEAD: Normocephalic and atraumatic.  EYES:  PERRLA.  EOMI.  MOUTH:  Dry mucous membranes but without erythema or exudate in the  posterior pharynx.  LUNGS:  Clear to auscultation bilaterally.  HEART:  Shows 2/6 systolic ejection murmur but regular rate and rhythm.  ABDOMEN: Soft, nontender, nondistended.  Positive bowel sounds.  EXTREMITIES:  No edema or cyanosis.  Pulses +2 dorsalis pedis and radial  pulses.  GENITAL/RECTAL:  Deferred.  NEUROLOGIC:  Cranial nerves grossly intact.  Strength 5/5 throughout. DTRs  +2 and symmetric.  Balance not tested.  SKIN:  No lesions.  ADENOPATHY: None noted.   LABORATORY DATA:  WBC 24.1, hemoglobin 13.5, hematocrit 40.3, platelets  264,000, 90% neutrophils, 7% lymphocytes, 3% monocytes. Sodium 137,  potassium 4.8, chloride 103, bicarb 17, BUN 16, creatinine 1.2, glucose 410.  Calcium 9.6.  UA shows specific gravity of 1.035, glucose greater than 1000,  ketones greater than 80, wbc's 0 to 2.   Chest x-ray within normal limits.   ASSESSMENT AND PLAN:  A 30 year old female in diabetic ketoacidosis.  1.  Diabetic ketoacidosis.  Start Glucomander protocol.  The patient to      receive 1 liter normal saline followed by D5 half normal saline at 250      mL per hour.  She is to be stated on carbohydrate-free, clear liquid      diet.  We will check BMP q.4h.  Once her gap closes, we will transition      her to Lantus with 2-hour overlap on Glucomander.  We will also check a      hemoglobin A1c.  2.  Fluid, electrolytes, and nutrition. The patient is to get IV fluids at      350 mL per hour following a 1 liter bolus.  She is  also to start clear,      carbohydrate-free diet.  3.  Prophylaxis. The patient is to start Protonix 40 mg daily.      Whitney Post, M.D.    ______________________________  Asencion Partridge, M.D.    KF/MEDQ  D:  01/17/2005  T:  01/18/2005  Job:  [id number]

## 2010-06-17 NOTE — Discharge Summary (Signed)
[name], [name]              ACCOUNT NO.:  [id number]   MEDICAL RECORD NO.:  [id number]          PATIENT TYPE:  INP   LOCATION:  3025                         FACILITY:  [facility]   PHYSICIAN:  Asencion Partridge, M.D.     DATE OF BIRTH:  1980/03/14   DATE OF ADMISSION:  01/17/2005  DATE OF DISCHARGE:  01/19/2005                                 DISCHARGE SUMMARY   CONSULTATIONS:  None.   DIAGNOSES:  1.  Diabetes mellitus type 1.  2.  Diabetic ketoacidosis.  3.  Sore throat.  4.  Mild anemia.   PROCEDURES:  Chest x-ray on January 17, 2005, which was normal.   IMPORTANT LABS:  TSH 1.705.  Blood cultures:  No growth to date.  Iron 93,  total iron-binding capacity 313, percent saturation 30.  Rapid strep  negative.  A1c 11.1.  Urine HCG negative.  Creatinine 0.7 at discharge.  Hemoglobin 11.2 at discharge.   DISCHARGE MEDICATIONS:  1.  Insulin NPH 70/30 30 units before breakfast and 15 units before dinner.  2.  Chloraseptic spray every four hours as needed.   HOSPITAL COURSE:  The patient was admitted on January 17, 2005, with high  blood sugars and dehydrated in diabetic ketoacidosis with metabolic acidosis  and anion gap.  The patient quickly improved with progressive rehydration  with normal saline bolus and insulin administration through IV drip.  Her  aggressive rehydration was continued throughout her hospital stay but on the  morning of January 18, 2005, the patient was weaned from her insulin drip,  transitioned to insulin NPH 70/30 30 units in the morning before breakfast  and 15 units before dinner in the evening.  We chose this regimen instead of  Lantus because the patient desires a simple regimen and the NPH 70/30 only  requires two shots daily.  Also, the patient has difficulty affording her  medications and NPH 70/30 is an inexpensive medication.  There was mild  concern because the patient works night shifts but after further  questioning, the patient, even when  she works her night shifts, eats  breakfast, lunch and dinner at typical times, so we felt that NPH 70/30  would be a good choice.  Her blood sugars on the 20th and the morning of the  21st were stable, less than 200, and she was discharged on January 19, 2005.  She also showed no signs of dehydration at discharge, no ketones in  her urine.   FOLLOW-UP:  The patient will follow up with Dr. Emilia Beck at the family  practice center on January 26, 2005, at 4 p.m.   HOSPITAL COURSE BY DIAGNOSES:  Problem 1.  DIABETES MELLITUS/DIABETIC KETOACIDOSIS:  The patient was  rehydrated and started on NPH 70/30 30 units before breakfast and 15 units  before dinner.   Problem 2.  SORE THROAT:  This is probably secondary to nausea and vomiting  that occurred prior to admission with onset of DKA.  It is probably just  irritation, but the patient was discharged with Chloraseptic spray every  four hours as needed.   Problem 3.  MILD ANEMIA:  The patient's hemoglobin was 11.1 at discharge.  Iron studies indicate that this is not an iron-deficiency anemia.  The MCV  was also normal, indicating this is not a macrocytic anemia.  At this point  I think it is mild, asymptomatic but warrants outpatient follow-up.      Angeline Slim, M.D.    ______________________________  Asencion Partridge, M.D.    AL/MEDQ  D:  01/19/2005  T:  01/21/2005  Job:  [id number]   cc:   Hansel Feinstein, M.D.  [address]  [place], [place] [address]

## 2010-06-17 NOTE — H&P (Signed)
NAMEJAYMARIE, [name]              ACCOUNT NO.:  [id number]   MEDICAL RECORD NO.:  [id number]          PATIENT TYPE:  EMS   LOCATION:  MAJO                         FACILITY:  [facility]   PHYSICIAN:  Devra Dopp, MD     DATE OF BIRTH:  21-Jul-1980   DATE OF ADMISSION:  04/06/2006  DATE OF DISCHARGE:                              HISTORY & PHYSICAL   ADMISSION DIAGNOSIS:  DKA.   PRIMARY PHYSICIAN:  Dr. Levander Campion at [facility].   ATTENDING OF RECORD:  Dr. Charissa Bash.   HISTORY OF PRESENT ILLNESS:  The patient complains of one day of nausea  and vomiting that is nonbilious, nonbloody, yellowish that began last  evening.  She was able to keep some Gatorade down last night and nothing  this morning.  The patient had a URI earlier in the week that is now  resolved.  She was also seen at [facility] earlier in the  week was diagnosed with BV and a yeast infection and treated with oral  antibiotics.  The patient does have a history of poorly controlled  diabetes.  She states she last took her full dose of insulin 70/30 last  afternoon.   REVIEW OF SYSTEMS:  She denies any chest pain.  She does complain of  some mild abdominal pain and has coffee-ground emesis times 1 in the ED.  No rash to her skin.  She does complain of mild sore throat from her  vomiting.   PAST MEDICAL HISTORY:  Significant for type 1 diabetes since the age of  65 and hypercholesterolemia.  She has had multiple admissions for DKA and  diabetic gastroparesis.  She does have a family history of diabetes in  an aunt, a grandmother who has had an MI, and both her father, aunt, and  grandmother who have had hypertension.  The patient is currently on  insulin 70/30, 30 units in the morning and 30 units at night.  She was  completing a course of Flagyl and Diflucan for problems stated above.  She has not been taking Pravachol, nor has she taken aspirin daily as  listed on her medicine list.   She does have a history of smoking.  She  quit in the year 2000 and does have occasional alcohol use.   OBJECTIVE:  VITAL SIGNS:  Temperature 98.3, pulse 102, blood pressure  120/71, respirations 20, saturating 100% on room air.  GENERAL:  She is in no acute distress, alert.  HEENT:  Normocephalic and atraumatic.  Pupils equal, round, and reactive  to light.  Oral mucosa is pink and moist except for some slight  pharyngeal erythema.  LUNGS:  Clear to auscultation.  HEART:  Regular rate and rhythm.  ABDOMEN:  Some mild tenderness to palpation diffusely with good bowel  sounds.  EXTREMITIES:  No edema.  Pulses are +2.  NEUROLOGIC:  Cranial nerves are intact.  Reflexes:  Strength is normal.  SKIN:  No rashes.  She has no lymphadenopathy.   LABS:  Sodium 136, potassium 4.8, chloride 99, bicarbonate 17, BUN 8,  creatinine 0.92, glucose  428, anion gap of 20, total bilirubin 1.7,  alkaline phosphatase 85, AST 34, ALT 23, white count 11.2, hemoglobin  13.3, hematocrit 40.3, platelets 301, 80% neutrophils, 14% lymphocytes,  urine pregnancy is negative.  Urinalysis showed glucose greater than  1000, ketones greater than 80.  No nitrates.  No leukocytes.  Urine  micro showed 0-2 WBCs and 0-2 RBCs with rare bacteria.   ASSESSMENT AND PLAN:  This is a 30 year old patient with history of  poorly controlled diabetes now in diabetic ketoacidosis.  1. Diabetic ketoacidosis.  Start her on Glucommander and intravenous      fluids.  The patient is to remain n.p.o. for now except for ice      chips.  We will check an A1c.  Her last A1c was 14 in April of      2007.  We will watch for close of the anion gap and blood sugars to      decrease to less than 160 prior to her transition to subcutaneous      insulin.  The patient will remain on normal saline until capillary      blood glucose is less than 250, and then we will change the D5 half      normal saline and add potassium.  We will watch for a drop  in her      potassium and will check the BUN every 3 hours.  2. Diabetes.  The patient is poorly compliant as an outpatient.  May      consider increasing her insulin dose as an outpatient if A1c is      high.  She will need continued encouragement to follow up with her      primary care physician on a regular basis.  3. History of hypercholesterolemia.  We will check a fasting lipid      panel.  Likely, we will need to restart Pravachol at discharge.  4. Gastritis, likely from vomiting.  Use a proton pump inhibitor.      Watch for gastrointestinal bleed.  Hemoglobin is stable for now.  5. Deep vein thrombosis prophylaxis.  We will encourage early      ambulation.  6. Vaginitis.  We will consider re-treating with an oral dose once the      patient is no longer n.p.o.  She is unable to finish her course due      to illness.      Devra Dopp, MD     TH/MEDQ  D:  04/06/2006  T:  04/06/2006  Job:  [id number]

## 2010-06-17 NOTE — Op Note (Signed)
NAME:  [name], [name]                        ACCOUNT NO.:  [id number]   MEDICAL RECORD NO.:  [id number]                   PATIENT TYPE:  INP   LOCATION:  9105                                 FACILITY:  [facility]   PHYSICIAN:  Tanya S. Shawnie Pons, M.D.                DATE OF BIRTH:  03/24/1980   DATE OF PROCEDURE:  06/28/2003  DATE OF DISCHARGE:                                 OPERATIVE REPORT   PREOPERATIVE DIAGNOSES:  1. Intrauterine pregnancy at 37-5/7 weeks.  2. Severe preeclampsia.  3. Class B diabetes.  4. Breech presentation.  5. Previous cesarean section.  6. Undesired fertility.   POSTOPERATIVE DIAGNOSES:  1. Intrauterine pregnancy at 37-5/7 weeks.  2. Severe preeclampsia.  3. Class B diabetes.  4. Breech presentation.  5. Previous cesarean section.  6. Undesired fertility.   PROCEDURE:  1. Repeat low transverse cesarean section  2. Bilateral tubal ligation using Filshie clip.   SURGEON:  Shelbie Proctor. Shawnie Pons, M.D.   ANESTHESIA:  Spinal.   FINDINGS:  Viable female infant; Apgar's 9 and 9, breech presentation.   ESTIMATED BLOOD LOSS:  1000 cc.   COMPLICATIONS:  None.   SPECIMENS:  Placenta to pathology.   REASON FOR PROCEDURE:  The patient is a 30 year old gravida 2, para 0-1-0-1  who is at 37-5/7 weeks.  The patient had previously been followed in the  high-risk clinic for class B diabetes, and had been to the clinic for the  past two weeks.  She came in for her visit three days prior to her surgery  date, and was noted to have blood pressures in the 170/110 range.  At that  point she was asked to follow up for laboratory work and for urine.  She  brought these back in on Friday, she has had similar blood pressures.  Results of her 24-hr urine reveals 450 mg of protein in it.  On the day of  surgery the patient came in complaining of headache, and because of this her  elevated blood pressure and her repeat essentially elevated blood pressures  it was felt the patient was  best served by delivery.  The patient has  already counseled in regards to the risks and benefits of repeat cesarean  section plus BTL in the high-risk clinic.  The patient desired repeat  cesarean section and sterilization.   DESCRIPTION OF PROCEDURE:  The patient was taken to the OR, where the spinal  analgesia was administered.  She was then prepped and draped in the usual  sterile fashion.  When the anesthesia was found to be adequate, a knife was  used to make a skin incision through her previous scar.  The Pfannenstiel  skin incision was then carried down to the underlying fascia.  The fascia  was incised in the midline and this incision extended laterally with the  Mayo scissors.  Two Kocher clamps were then used to elevate the  superior and  inferior edges of the fascia off the underlying rectus; this was dissected  off with electrocautery.  A hemostat was used to enter the peritoneal  cavity.  This incision was extended with the electrocautery in the inferior  portion, with care being taken not to be anywhere near the bladder.  There  was some mild adhesions of the omentum to the anterior peritoneal edge and  this was dissected off using electrocautery.   Once the cranial cavity was entered, a knife was used to make a low  transverse incision on th uterus.  The utero-ovarian segment was  exceptionally thin, and the peritoneal cavity entered easily in the midline.  This incision was extended laterally and bluntly  The amniotic sac was then  ruptured, and clear fluid was noted.   The baby was noted to be in the breech presentation, and was delivered  without difficulty.  The baby had nose and mouth bulb suctioned, and cord  clamp x2 taken to the awaiting neonatologist.  Spontaneous cry was heard and  Apgar's were 9 and 9.   Cord blood was obtained and the placenta delivered easily.  The uterus was  cleaned out with dry lap pads.  The edges of the incision were then grasped  with  ring forceps and a long ring was used to try to dilate the cervix;  however it could not be passed.  The uterine incision was then  closed with  a 0 Vicryl suture in a locked running fashion.   Attention was then turned to the tubes.  The patient's left round ligament  was grasped with the Babcock, until the tube could be found.  The tube was  found, scar tissue remained in and around the ovary.  A Filshie clip was  placed across this.  Where the line had been grasped there was a rent in the  peritoneum, and this was closed with a figure-of-eight of 2-0 chromic; good  hemostasis was achieved.  Similarly, the right tube was visualized, followed  to its fimbriated end and Filshie clip placed across it.   The patient was then turned back to the uterine incision, which was  hemostatic.  The abdominal cavity was then irrigated and clots removed.  The  incision was again looked at and found to be hemostatic.  The fascia was  closed with 0 Vicryl suture in a running fashion.  Subcutaneous tissue  irrigated and any bleeders cauterized.  The skin was closed using clips.  All sponge, needle and lap and instrument counts x2.  The patient was  awakened and taken to the recovery room in stable condition .                                               Shelbie Proctor. Shawnie Pons, M.D.    TSP/MEDQ  D:  06/28/2003  T:  06/28/2003  Job:  [id number]

## 2010-06-17 NOTE — Discharge Summary (Signed)
[facility]  Patient:    TEXAS, OBORN Visit Number: [id number] MRN: [id number]          Service Type: Attending:  Ed Blalock. Burnadette Peter, M.D. Adm. Date:  08/22/00 Disc. Date: 08/31/00                             Discharge Summary  DATE OF BIRTH:                [birth year]  REASON FOR ADMISSION:         The patient was admitted from high-risk clinic for evaluation of worsening preeclampsia.  Fetal hypoxemia, nonreassuring ______ .  HISTORY OF PRESENT ILLNESS:   [age]-year-old G1 with insulin-dependent diabetes, uncontrolled in the past and throughout the pregnancy.  Seen in high-risk clinic and sent over for evaluation of ______ blood pressures and possible preeclampsia.  She did have edema, proteinuria, and elevated blood pressures.  HOSPITAL COURSE:              The patient was admitted for monitoring of her blood sugars as well as blood pressures and PIH laboratories.  Throughout the hospitalization she had worsening PIH laboratories and increasing blood pressures.  She did develop severe preeclampsia.  The patient received lower transverse C-section secondary to severe preeclampsia.  She was on magnesium postpartum for seizure prophylaxis and continued to receive sliding scale insulin for control of her diabetes.  She was delivered of a viable female infant, Apgars of 8 and 9 at one and five minutes respectively on August 26, 2000, at 35.  After a postpartum course that included being started on p.o. antihypertensive medicines, the patient was discharged home for follow-up with her primary physician.  DISCHARGE MEDICATIONS:        1. Labetalol 100 mg p.o. b.i.d.                               2. Procardia XL 60 mg q.d.  DIET:                         ADA, 1800 calorie diet, low salt.  FOLLOW-UP:                    Appointment with her primary physician, Dr. Roger Shelter, on September 11, 2000, as well as the nurses on the sixth floor for  a blood pressure check. Attending:  Ed Blalock. Burnadette Peter, M.D. DD:  09/19/00 TD:  09/20/00 Job: [id number]

## 2010-06-17 NOTE — Discharge Summary (Signed)
NAME:  [name], [name]                        ACCOUNT NO.:  [id number]   MEDICAL RECORD NO.:  [id number]                   PATIENT TYPE:  MAT   LOCATION:  MATC                                 FACILITY:  [facility]   PHYSICIAN:  Tanya S. Shawnie Pons, M.D.                DATE OF BIRTH:  1980/02/18   DATE OF ADMISSION:  07/05/2003  DATE OF DISCHARGE:  07/05/2003                                 DISCHARGE SUMMARY   HOSPITAL COURSE:  Ms. Casamento is a pleasant 30 year old G2, P64 who  presented to the MAU at 37-6/7 weeks for a low transverse C-section due to  severe preeclampsia.  She also has a history of Class B diabetes and baby is  in breech presentation.  She delivered a viable female on Jun 28, 2003, with  Apgars of 9 and 9.  Estimated blood loss approximately 1000 mL.  The patient  was stable postoperatively, admitted to the ICU and kept on magnesium  sulfate for 24 hours.  Her blood pressures stabilized nicely over the next 3  days and postpartum she had no problems other than blood glucose control  secondary to her diabetes.  Upon discharge her CBGs ranged from 44 to 256.   ACTIVITY:  Patient is not to lift anything greater than 10 pounds for 6  weeks.  Nothing per vagina for 6 weeks.   DIET:  1800 ADA diet.   MEDICATIONS:  1. Regular insulin 13 units subcu q.a.c. breakfast.  2. NPH 13 units subcu q.a.c. breakfast.  3. Regular insulin 13 units subcu a.c. dinner.  4. Prenatal vitamins one p.o. daily.  5. Ibuprofen 600 mg one p.o. q.6 hours p.r.n. pain.  6. Percocet 5/325 mg one p.o. q.4 hours p.r.n. pain.   STATUS:  Well.   INSTRUCTIONS:  Routine.  Discharged to home.  Followup in __________ family  practice center in 6 weeks.     Obstetrics Resident                       Shelbie Proctor. Shawnie Pons, M.D.    OR/MEDQ  D:  07/20/2003  T:  07/21/2003  Job:  [id number]

## 2010-06-17 NOTE — Discharge Summary (Signed)
NAMEMARJIE, [name]              ACCOUNT NO.:  [id number]   MEDICAL RECORD NO.:  [id number]          PATIENT TYPE:  INP   LOCATION:  5733                         FACILITY:  [facility]   PHYSICIAN:  Adrian Blackwater, MDDATE OF BIRTH:  04/03/1980   DATE OF ADMISSION:  04/06/2006  DATE OF DISCHARGE:  04/08/2006                               DISCHARGE SUMMARY   ADMISSION DIAGNOSES:  1. Diabetic ketoacidosis.  2. Diabetes mellitus type 1.  3. Hyperlipidemia.  4. Gardnerella vaginitis.   DISCHARGE DIAGNOSES:  1. Diabetes mellitus type 2 uncontrolled without complications.  2. Hyperlipidemia.  3. Gardnerella vaginitis.   MEDICATIONS AT DISCHARGE:  1. Lantus insulin 20 units subcutaneously q.h.s.  2. NovoLog insulin per sliding-scale insulin q.i.d.  Sliding scale is      provided for blood sugar less than 60, take glucose tablet x2 or      sweet beverage, blood sugar 60 to 100 - zero units, 101 to 150 -      two units, 151 to 200 - three units, 201 to 250 - five units, 251      to 300 - seven units, 301 to 350 - nine units, higher than 350 - 11      units.  If reads elevated, call primary practice MD on call.      Patient instructed to check blood glucose four times a day      including fasting and before bedtime and use the sliding-scale      insulin.  3. Continue aspirin 81 mg p.o. daily.  4. Continue Pravachol 30 mg daily.  5. Continue Flagyl 500 mg p.o. b.i.d. x6 days.   BRIEF HISTORY:  [name] is a 30 year old African-American female  patient with diabetes mellitus type 1 and history of noncompliance with  insulin who presented morning of March 7 with diabetic ketoacidosis,  patient managed with Glucommander and fluids and transferred to sliding-  scale insulin at small rate plus Lantus 20 units p.o. q.h.s. with DKA  resolved prior to discharge, patient tolerated p.o. without  difficulties.   Brief diabetes management counseling provided by primary team.  Patient  understood diagnoses, prognosis and management.   LABORATORY DATA AT DISCHARGE:  White blood count 8.3, hemoglobin 11.8  stable, platelet count 243, sodium 138, potassium 3.5, glucose 148, BUN  2, creatinine 0.65.   Patient was discharged home on the Lantus/NovoLog insulin regimen,  borderline hypokalemia was treated with KCL 40 mEq p.o. before  discharge, patient instructed to follow a carbohydrate-modified diet  according to ADA  guidelines and to scheduled an appointment with primary physician, Dr.  Levander Campion, during the next week at the [facility]  Medications through [facility] through [facility] on [address]  and prescription for Glucommander and insulin syringes and needles  provided to patient.      Adrian Blackwater, MD     IM/MEDQ  D:  04/08/2006  T:  04/08/2006  Job:  [id number]

## 2010-06-17 NOTE — Discharge Summary (Signed)
[facility]  Patient:    [name], [name]                     MRN: [id number] Adm. Date:  [id number] Disc. Date: [id number] Attending:  Armanda Heritage Dictator:   Zella Ball, M.D.                           Discharge Summary  DATE OF BIRTH:                1980-10-29  DISCHARGE DIAGNOSES:          1. Intrauterine pregnancy at [redacted] weeks gestation.                               2. Insulin-dependent diabetes mellitus.                               3. Hypoglycemic loss of consciousness incident.  PROCEDURES:                   None.  CONSULTATIONS:                None.  HISTORY OF PRESENT ILLNESS:   This is a 31 year old G1 who presented to maternity admissions at 11 weeks 0 days gestation who had a chief complaint of "last night having seizures."  This episode was witnessed by the patients boyfriend, who was not available for history, but the patient herself stated that she had had seizure-like activity with jerking of her arms and legs.  How did not have any incontinence, no tongue-biting, no other effects, nor did she have a postictal period.  She was brought to [facility] with this episode and found to have a CBG of 22 right after the event.  Therefore, the patient was given some juice, and her blood sugar increased to 84, and she was transferred from [facility] to [facility].  The patient reports in a recent history that she has had four early a.m. hypoglycemic episodes over the last week where she checked her blood sugar and it was quite low, I believe, less than 50.  Prior to presentation the patient has been an insulin-dependent diabetic for 10-11 years.  Prior to getting pregnant she said her last A1C was 7.5, and she had been in fairly good control prior to becoming pregnant.  MEDICATIONS:                  Prenatal vitamin, Phenergan.  Insulin 20 of Regular, 35 of NPH a.m., and 20 of Regular and 25 of NPH q.p.m.   The patient also was on day #3 of 7 of a Flagyl course for prior episode of bacterial vaginosis with this pregnancy.  ALLERGIES:                    No known drug allergies.  PREGNANCY COMPLICATIONS:      The patient has had some nausea with pregnancy but has been able to, for the most part, hold down her foods and only needs the Phenergan on a p.r.n. basis.  PHYSICAL EXAMINATION:  VITAL SIGNS:                  On presentation the patient was afebrile at 99.4, pulse of 83,  respiratory rate of 18, blood pressure 137/68.  GENERAL:                      Her physical exam was within normal limits. Generally she was a well-developed black female in no acute distress.  HEART:                        Regular rate and rhythm, without murmurs.  LUNGS:                        Clear to auscultation bilaterally.  ABDOMEN:                      Soft, nontender, nondistended.  EXTREMITIES:                  No clubbing, cyanosis, or edema.  NEUROLOGIC:                   Nonfocal exam.  ASSESSMENT:                   This was a 30 year old G1 at [redacted] weeks gestation with type 1 diabetes mellitus with incidents of hypoglycemia secondary to likely both her nausea and vomiting that she has had with this pregnancy and continuing to take her insulin, as well as some changes in her basal insulin use with metabolic changes during pregnancy.  HOSPITAL COURSE:              The patient was admitted to the hospital. Initially her insulin doses were decreased, and she was actually placed on a sliding scale of insulin for a 24-hour period, full sliding scale of just Regular, where her insulin needs were estimated.  Her insulin regimen was augmented slightly, to the point where she was in moderate control.  The patient on the initial night of presentation had some hypoglycemic incidents, down into the 30s, I believe, but where she was symptomatic but no loss of consciousness.  After the first evening she did not  have any more hypoglycemic incidents.  Her blood sugars were brought into control and, upon discharge, they were averaging between 150-180, and she was not having any more hypoglycemic incidents.  Therefore, it was decided the patient could be well managed on an outpatient basis with close follow-up.  FOLLOW-UP:                    The patient is to return to the high-risk clinic later this week for an appointment to review her insulin regimen.  DISCHARGE MEDICATIONS:        Insulin NPH 16 units subcutaneous q.a.m., Regular 8 units subcutaneous q.p.m.  Six units Regular q.a.c. dinner, 6 units of NPH subcutaneous p.m. bedtime.  The patient also has a sliding scale of insulin on top of this.  That sliding scale is:  For blood sugars between 151-200 the patient is to take two units of insulin Regular subcutaneous; for 201-250, 4 units of subcutaneous insulin; for 251-300, 6 units of subcutaneous insulin.  The patient was given Phenergan 25 mg p.o. p.r.n.  Prenatal vitamins. DD:  03/19/00 TD:  03/19/00 Job: [id number]

## 2010-06-17 NOTE — Op Note (Signed)
[facility]  Patient:    [name], [name]                     MRN: [id number] Proc. Date: 08/26/00 Adm. Date:  [id number] Attending:  Tammi Sou                           Operative Report  PREOPERATIVE DIAGNOSES:       1. Intrauterine pregnancy at 32 to [redacted] weeks                                  gestation.                               2. Severe preeclampsia.                               3. Insulin-dependent diabetes mellitus.                               4. Fetal hypoxemia.  POSTOPERATIVE DIAGNOSES:      1. Intrauterine pregnancy at 32 to [redacted] weeks                                  gestation.                               2. Severe preeclampsia.                               3. Insulin-dependent diabetes mellitus.                               4. Fetal hypoxemia.                               5. Hyperspiral umbilical cord.  SURGERY:                      Low transverse cesarean section.  SURGEON:                      Conni Elliot, M.D.  ANESTHESIA:                   Spinal.  OPERATIVE FINDINGS:           Female infant with Apgars of 8 and 9. Cord pH was sent. Placenta was sent to pathology.  ESTIMATED BLOOD LOSS:         Less than 800 cc.  DESCRIPTION OF PROCEDURE:     The patient was placed under spinal anesthetic. The patient was supine in left lateral position receiving oxygen and was prepped and draped in sterile fashion. A low transverse Pfannenstiel incision was made. The incision was made in the skin through the fascia, rectus muscles separated in the midline, and peritoneal cavity entered. A bladder flap was created. A transverse uterine incision was  made. The baby was delivered with a breech presentation without difficulty. The cord was doubly clamped and cut and the baby handed to the neonatologist in attendance. The placenta delivered manually. The uterus, bladder flap, anterior peritoneum, fascia,  subcutaneous, skin were closed in usual fashion. Estimated blood loss less than 800 cc. Needle, instrument, and sponge count correct. DD:  08/26/00 TD:  08/26/00 Job: [id number]

## 2010-06-17 NOTE — Discharge Summary (Signed)
[name], [name]              ACCOUNT NO.:  [id number]   MEDICAL RECORD NO.:  [id number]          PATIENT TYPE:  INP   LOCATION:  5511                         FACILITY:  [facility]   PHYSICIAN:  Levander Campion, M.D.  DATE OF BIRTH:  03/21/1980   DATE OF ADMISSION:  08/07/2005  DATE OF DISCHARGE:  08/10/2005                                 DISCHARGE SUMMARY   DISCHARGE DIAGNOSES:  1.  Diabetic ketoacidosis.  2.  Diabetes mellitus type 1.  3.  Hyperkalemia.  4.  Hypercholesterolemia.   DISCHARGE MEDICATIONS:  1.  Lantus insulin 24 units nightly.  2.  NovoLog insulin 4 units prior to each meal.   PROCEDURES:  None.   CONSULT:  Diabetic treatment program.   DISCHARGE INSTRUCTIONS:  The patient is to follow up with Dr. Luz Humphrey as  [name] on July 26th at 1:30 p.m.   HOSPITAL COURSE:  1.  Briefly, the patient is as 30 year old African-American female who      presents to the [facility] ED with about a three-day history of chest,      back, stomach pain and nausea and vomiting and diarrhea.  She admitted      that she had not had any insulin in about three days.  It was unclear      whether or not she stopped the insulin because she was feeling sick or      if she stopped the insulin and then began to feel sick although upon      further investigation during her hospital stay, it was discovered that      she spent the night in jail for an unknown reason the night prior to      admission and did not receive any medication there.  The patient has a      15-year history of diabetes with noncompliance.  Her last A1c was 14.      At the ED, the patient had a UA that showed a glucose greater than 1,000      and ketones greater than 80.  Nitrite and leukocyte esterase were      negative.  Electrolytes showed a potassium of 3.1, a bicarb of 7 and a      glucose of 478, but the sample was hemolyzed.  The pH was 7.027 and she      had a PCO2 of 26.6.  An EKG was  obtained and showed tachycardia with      peaked T waves by problem #1, diabetic ketoacidosis with hyperkalemia.      On repeat the potassium was found to be 8 and with the EKG changes, the      patient was given 500 mg of calium gluconate and one amp of bicarb in      the emergency department.  She was bolused with normal saline and was      placed on a Glucommander.  She was ruled out for a cardiac cause with      negative cardiac enzymes.  A chest x-ray showed no acute change.  She  was kept NPO and aggressively hydrated and kept on the Glucommander      overnight and overnight her metabolic acidosis with anion gap      progressively resolved.  On the morning of the 10th, her blood glucoses      were ranging from 87 to 140 on the Glucommander and her bicarb had      raised to 18.  We advanced her diet, took her off the Glucommander and      put her on a regimen of 10 units of Lantus b.i.d. plus sensitive sliding-      scale insulin.  At this point, she tolerated her diet, but her blood      glucose readings were in the 300s for the remainder of this day and the      next and the diabetic care coordinator was consulted and we came up with      an insulin regimen to better control her diabetes of Lantus 24 units      plus 4 units prior to each meal.  By the end of the day on the 10th, her      bicarb was normal at 25 and remained normal throughout the      hospitalization.  On the 11th and 12th, her CBGs improved to 95 to 194      and the patient was felt to be stable to go home on her new insulin      regimen.  However, we were extremely concerned with her history of      noncompliance in the past due to financial reason, social worker was      consulted and they advised her to continue her pursuit of Medicaid      through DSS which the patient had already started.  We asked care      management to try to expedite this process and, however, on the 12th at      about noon, the patient  threatened to leave AMA and upon talking to her      was going to leave AMA and said that she did not want any help getting      her medications and that she was going to be able to get them on her own      so the patient was discharged with prescriptions for her new insulin      regimen.  The regimen was explained in detail and a follow-up      appointment was made for July 26th at 1:30 p.m.   1.  Hyperkalemia.  The patient was admitted with a potassium of 8 and EKG      showed peaked T waves and this was treated with calcium gluconate, one      amp of bicarb and insulin and over the course of hospital stay, her      potassium normalized and on discharge was 3.6.   1.  Diabetes type 1.  Patient has a history of noncompliance with a recent      A1c of 14.  Patient was started on a regimen of 24 units of Lantus daily      plus 4 units of NovoLog with each meal and did very well on this regimen      for a day-and-a-half in the hospital prior to her discharge.           ______________________________  Levander Campion, M.D.     JH/MEDQ  D:  08/10/2005  T:  08/11/2005  Job:  [id number]

## 2010-06-17 NOTE — H&P (Signed)
[place]  Patient:    [name], [name]                     MRN: [id number] Adm. Date:  [id number] Attending:  Doneta Public Dictator:   Kinnie Scales Priebe CC:         Amparo Bristol, M.D., [name]                         History and Physical  PROBLEM LIST:  1. Diabetes mellitus type 1.  2. Trichomonas.  CHIEF COMPLAINT:  Left flank pain.  HISTORY OF PRESENT ILLNESS:  This is a type 1 diabetic female who presented with left flank pain developing progressively today.  The patient is unable to declare whether it is sharp, stabbing, burning, gripping, cramping pain.  She just reports, "It hurts."  The patient has also noticed a decreased appetite.  She has had some intermittent vaginal spotting as well.  The patient was seen and diagnosed with a UTI as she had pyuria on a UA two days ago at [facility].  The patient was placed on Cipro and had taken two days of this.  The left flank pain and back pain had  just worsened.  The patient denies any fevers or chills accompanying this.  The  patient denies any palpitations, nausea, vomiting, diarrhea, constipation, or shortness of breath.  She also denies dysuria or frequency.  The patient did have a pelvic exam at last hospital visit with a negative wet prep, negative GC, Chlamydia, and a negative gynecologic exam.  PROBLEM LIST:  1. Diabetes mellitus.  2. History of Trichomonas.  3. Hospital admission, October 2000, for dehydration and abdominal pain.  MEDICATIONS:  Insulin 20 units in a.m.; 10 units NPH p.m. and 30 units in a.m. She also takes 10 units of regular at dinner.  FAMILY HISTORY:  She denies.  Noncontributory.  SOCIAL HISTORY:  Quit smoking October 2000.  She only smoked for several years.  Occasional alcohol use.  She denies any other drug use.  She works at [employer]. She lives with her mother and 7-year-old brother.  She is currently  sexually active and reports using condoms.  ALLERGIES:  No known drug allergies.  PAST HOSPITALIZATIONS:  As noted above.  Hospitalized October 2000 for dehydration and abdominal pain.  REVIEW OF SYSTEMS:  No fevers, chills, palpitations, nausea, vomiting, diarrhea, constipation.  She note decreased appetite.  No shortness of breath.  No weakness, dysuria, or frequency.  PHYSICAL EXAMINATION:  VITAL SIGNS:  Temperature 97.8, blood pressure 111/74, pulse 84, respirations 16. Pulse oximetry not checked.  GENERAL:  The patient is well-developed, well-nourished, obese, in no acute distress.  HEENT:  PERRLA, EOMI.  TMs visualized bilaterally.  Not edematous, nonbulging.  Nares with rhinorrhea bilaterally.  Oropharynx nonerythematous.  No discharge.  Tongue and uvula midline.  NECK:  No lymphadenopathy.  Trachea midline.  CHEST:  Clear to auscultation bilaterally.  HEART:  Regular rate and rhythm without murmurs noted.  ABDOMEN:  Mildly tender over left upper quadrant.  Left flank pain and positive CVA tenderness on left.  The patient did have good bowel sounds.  She was nondistended. No guarding or rigidity.  GENITOURINARY:  Deferred.  GASTROINTESTINAL:  Rectal - did not check.  EXTREMITIES:  No cyanosis, clubbing, or edema.  Capillary refill less than two seconds.  Distal pulses and radial and dorsalis pedis 2+ bilaterally.  NEUROLOGIC:  Cranial nerves II-XII grossly intact.  Strength in upper and lower  extremities 5/5.  The patient was not very cooperative during the exam.  LABORATORY:  WBC 8.6, hemoglobin 12.7, platelets 307, ANC 4.8, blood sugar 123.  Albumin 3.4, AST 22, alk phos 57, ALT 14, total bili 0.4.  Urine pregnancy negative.  Sodium 140, potassium 3.7, chloride 104, bicarb 28, BUN 7, creatinine 0.8.  UA:  Specific gravity is 1.023.  Large hemoglobin.  Positive protein 30. Moderate leukocyte esterase.  Wbcs 11-20, rbcs 21-50.  Few  trichomonads.  ASSESSMENT AND PLAN:  This 30 year old female with flank pain, pyuria.  1. Genitourinary with positive findings of her urine, it is very possible this  patient has an inadequately treated pyelo versus a resistant bacteria to Cipro causing urinary tract symptoms.  With her CVA tenderness and presentation, we are concerned enough to admit her.  We will treat patient empirically with cefotaxime 1 gm q.12h. and watch for resolution of symptoms over the next 24 hours.  If at  that time her pain does not respond, we will evaluate further with a possible ultrasound and abdominal computed tomography.  Other considerations are tubo-ovarian abscess, ectopic pregnancy, however, she was negative urine pregnant. Perinephric abscess, adhesions, renal obstruction, pelvic inflammatory disease.   2. Trichomonas.  Initially we will not empirically treat her, however, if attending physician and cross-covering physician feel strong that this may be contributing to her symptoms, we can treat with b.i.d. dosing of Flagyl.   3. Psychiatric.  The patient does have a very unusual affect and depressed mood. She may benefit from psychotherapy.  The patient may have some secondary gain. The patient denies any recent stresses in her life, however, she is very withdrawn,  poor historian, and not very communicative.  This can be addressed while hospitalized, as well as as an outpatient. DD:  05/26/99 TD:  05/26/99 Job: [id number]

## 2010-06-23 ENCOUNTER — Encounter: Payer: Self-pay | Admitting: Sports Medicine

## 2010-06-23 ENCOUNTER — Ambulatory Visit (INDEPENDENT_AMBULATORY_CARE_PROVIDER_SITE_OTHER): Payer: Medicaid Other | Admitting: Sports Medicine

## 2010-06-23 VITALS — BP 120/70 | HR 84 | Wt 162.3 lb

## 2010-06-23 DIAGNOSIS — N760 Acute vaginitis: Secondary | ICD-10-CM

## 2010-06-23 DIAGNOSIS — N898 Other specified noninflammatory disorders of vagina: Secondary | ICD-10-CM

## 2010-06-23 LAB — POCT WET PREP (WET MOUNT)

## 2010-06-23 LAB — POCT URINALYSIS DIPSTICK
Bilirubin, UA: NEGATIVE
Blood, UA: NEGATIVE
Glucose, UA: 500
Nitrite, UA: NEGATIVE
Spec Grav, UA: 1.02

## 2010-06-23 MED ORDER — METRONIDAZOLE 500 MG PO TABS: 500.0000 mg | ORAL_TABLET | Freq: Two times a day (BID) | ORAL | Status: AC

## 2010-06-23 NOTE — Progress Notes (Signed)
  Subjective:    Patient ID: [name], female    DOB: 06-11-1980, 30 y.o.   MRN: [id number]  HPI VAGINAL DISCHARGE Onset: 1 wk Description: itchy, clear/yellowish.  Symptoms Odor: YES Itching: YES Vaginal burning: NO Dysuria: NO Bleeding: NO Pelvic pain: NO Back pain: NO Fever: NO Genital sores: NO Rash: NO Dyspareunia: NO GI Symptoms:NO  Red Flags:  Missed period: NO Recent antibiotics: YES Possible STD exposure: NO IUD: NO Diabetes: YES    Review of Systems    See HPI Objective:   Physical Exam  Constitutional: She appears well-developed and well-nourished. No distress.  Abdominal: Soft. Bowel sounds are normal. She exhibits no distension and no mass. There is no tenderness. There is no rebound and no guarding.  Genitourinary: Uterus normal. Cervix exhibits no motion tenderness, no discharge and no friability. Right adnexum displays no mass, no tenderness and no fullness. Left adnexum displays no mass, no tenderness and no fullness. No bleeding around the vagina. Vaginal discharge found.       Clear discharge. Some old clots from recent menstruation.          Assessment & Plan:

## 2010-06-23 NOTE — Assessment & Plan Note (Addendum)
Wet prep. UA as there was suprapubic tenderness. No signs to suggest PID/GC/Chlam however GC/Chlam swab done. No PAP needed until 08/2011.  Marland KitchenMarland KitchenMarland KitchenWolff-Parkinson-White syndrome suggestive of BV, will rx flagyl.

## 2010-06-23 NOTE — Progress Notes (Signed)
Addended by: [place], Wrenn Willcox on: 06/23/2010 04:19 PM   Modules accepted: Orders

## 2010-06-23 NOTE — Progress Notes (Signed)
Addended by: Rodney Langton on: 06/23/2010 04:44 PM   Modules accepted: Orders

## 2010-06-24 LAB — GC/CHLAMYDIA PROBE AMP, GENITAL: GC Probe Amp, Genital: NEGATIVE

## 2010-07-06 ENCOUNTER — Ambulatory Visit: Payer: Medicaid Other | Admitting: Family Medicine

## 2010-08-08 ENCOUNTER — Ambulatory Visit: Payer: Medicaid Other | Admitting: Family Medicine

## 2010-08-12 ENCOUNTER — Ambulatory Visit (INDEPENDENT_AMBULATORY_CARE_PROVIDER_SITE_OTHER): Payer: Medicaid Other | Admitting: Family Medicine

## 2010-08-12 ENCOUNTER — Encounter: Payer: Self-pay | Admitting: Family Medicine

## 2010-08-12 VITALS — BP 117/75 | HR 75 | Temp 98.3°F | Ht 66.25 in | Wt 153.6 lb

## 2010-08-12 DIAGNOSIS — N760 Acute vaginitis: Secondary | ICD-10-CM

## 2010-08-12 DIAGNOSIS — N898 Other specified noninflammatory disorders of vagina: Secondary | ICD-10-CM

## 2010-08-12 DIAGNOSIS — N390 Urinary tract infection, site not specified: Secondary | ICD-10-CM

## 2010-08-12 LAB — POCT URINALYSIS DIPSTICK
Glucose, UA: 250
Protein, UA: NEGATIVE
Spec Grav, UA: 1.015
Urobilinogen, UA: 1

## 2010-08-12 MED ORDER — FLUCONAZOLE 150 MG PO TABS: 150.0000 mg | ORAL_TABLET | Freq: Once | ORAL | Status: DC

## 2010-08-12 MED ORDER — METRONIDAZOLE 500 MG PO TABS: 500.0000 mg | ORAL_TABLET | Freq: Two times a day (BID) | ORAL | Status: AC

## 2010-08-12 MED ORDER — CEPHALEXIN 500 MG PO CAPS: 500.0000 mg | ORAL_CAPSULE | Freq: Two times a day (BID) | ORAL | Status: AC

## 2010-08-12 NOTE — Patient Instructions (Signed)
Thank you for coming in today. 1) You have a UTI ... Take the keflex twice a day for 1 week. I will call you if we need to change antibiotics.  2) You have BV again. Take flagyl twice a day for 1 week.  3) You have a RX for fluconazole if you get a yeast infection after the antibiotics.   4) You are doing better with your diabetes, but we need to have better control if you do not want to keep having BV and UTIs.  Come back in 2 weeks with your DM log and meter and we will go over your control and adjust your insulin therapy.

## 2010-08-13 LAB — GC/CHLAMYDIA PROBE AMP, GENITAL
Chlamydia, DNA Probe: NEGATIVE
GC Probe Amp, Genital: NEGATIVE

## 2010-08-14 NOTE — Progress Notes (Signed)
[name] presents to clinic today to address her vaginal discharge and dysurea.  Both have been present for several days to around 1 week now. She notes discharge is white and thin. Her notes pain on urination and urinary frequency and urgency. No blood in her urine. Note urine is cloudy and foul smelling.   Exam:  Vs noted.  Gen: Well NAD HEENT: EOMI, PERRL, MMM Lungs: CTABL Nl WOB Heart: RRR no MRG Abd: NABS, NT, ND Exts: Non edematous BL  LE Genitals: Normal external genitalia. Thin white discharge. No CV motion tenderness.   Wet Prep with clue cells.

## 2010-08-15 LAB — URINE CULTURE: Colony Count: >=100000

## 2010-08-16 ENCOUNTER — Telehealth: Payer: Self-pay | Admitting: Family Medicine

## 2010-08-16 NOTE — Telephone Encounter (Signed)
Pt called back and would like someone to call before 10am

## 2010-08-16 NOTE — Telephone Encounter (Signed)
Called back and gave results of urine culture. Will see in clinic for DM management.

## 2010-08-16 NOTE — Telephone Encounter (Signed)
See previous message. Fwd. To pcp .Arlyss Repress

## 2010-08-16 NOTE — Telephone Encounter (Signed)
Called patient about her urine culture results however I am unable to leave a message. I will mail a letter.

## 2010-08-16 NOTE — Assessment & Plan Note (Signed)
Due to BV. Based on clue cells on wet prep. Will use flagyl for 7 days and follow up prn.

## 2010-08-16 NOTE — Assessment & Plan Note (Signed)
Think UTI based on UA results. Will treat with keflex and obtain culture to confirm sensitivity as multiple UTIs in the past. Will also provide diflucan for yeast infection after abx course. Stressed the importance of DM control to prevent further UTIs.

## 2010-09-17 ENCOUNTER — Emergency Department (HOSPITAL_COMMUNITY)
Admission: EM | Admit: 2010-09-17 | Discharge: 2010-09-18 | Disposition: A | Discharge status: Home / Self Care | Payer: Medicaid Other | Attending: Emergency Medicine | Admitting: Emergency Medicine

## 2010-09-17 DIAGNOSIS — E119 Type 2 diabetes mellitus without complications: Secondary | ICD-10-CM | POA: Insufficient documentation

## 2010-09-17 DIAGNOSIS — R112 Nausea with vomiting, unspecified: Secondary | ICD-10-CM | POA: Insufficient documentation

## 2010-09-17 DIAGNOSIS — E7800 Pure hypercholesterolemia, unspecified: Secondary | ICD-10-CM | POA: Insufficient documentation

## 2010-09-17 DIAGNOSIS — Z794 Long term (current) use of insulin: Secondary | ICD-10-CM | POA: Insufficient documentation

## 2010-09-17 DIAGNOSIS — K5289 Other specified noninfective gastroenteritis and colitis: Secondary | ICD-10-CM | POA: Insufficient documentation

## 2010-09-17 DIAGNOSIS — R197 Diarrhea, unspecified: Secondary | ICD-10-CM | POA: Insufficient documentation

## 2010-09-17 LAB — GLUCOSE, CAPILLARY: Glucose-Capillary: 406 mg/dL — ABNORMAL HIGH (ref 70–99)

## 2010-09-18 LAB — CBC
HCT: 37.1 % (ref 36.0–46.0)
Hemoglobin: 12.5 g/dL (ref 12.0–15.0)
MCV: 87.9 fL (ref 78.0–100.0)
RBC: 4.22 MIL/uL (ref 3.87–5.11)
WBC: 12.7 10*3/uL — ABNORMAL HIGH (ref 4.0–10.5)

## 2010-09-18 LAB — DIFFERENTIAL
Basophils Absolute: 0 10*3/uL (ref 0.0–0.1)
Eosinophils Relative: 0 % (ref 0–5)
Lymphocytes Relative: 14 % (ref 12–46)
Lymphs Abs: 1.8 10*3/uL (ref 0.7–4.0)
Neutro Abs: 9.9 10*3/uL — ABNORMAL HIGH (ref 1.7–7.7)
Neutrophils Relative %: 78 % — ABNORMAL HIGH (ref 43–77)

## 2010-09-18 LAB — POCT PREGNANCY, URINE: Preg Test, Ur: NEGATIVE

## 2010-09-18 LAB — LIPASE, BLOOD: Lipase: 11 U/L (ref 11–59)

## 2010-09-18 LAB — COMPREHENSIVE METABOLIC PANEL
Alkaline Phosphatase: 72 U/L (ref 39–117)
BUN: 9 mg/dL (ref 6–23)
Chloride: 96 mEq/L (ref 96–112)
GFR calc Af Amer: >60 mL/min (ref >60)
Glucose, Bld: 427 mg/dL — ABNORMAL HIGH (ref 70–99)
Potassium: 3.5 mEq/L (ref 3.5–5.1)
Total Bilirubin: 1 mg/dL (ref 0.3–1.2)
Total Protein: 8.3 g/dL (ref 6.0–8.3)

## 2010-09-18 LAB — GLUCOSE, CAPILLARY
Glucose-Capillary: 198 mg/dL — ABNORMAL HIGH (ref 70–99)
Glucose-Capillary: 220 mg/dL — ABNORMAL HIGH (ref 70–99)

## 2010-09-18 LAB — URINALYSIS, ROUTINE W REFLEX MICROSCOPIC
Bilirubin Urine: NEGATIVE
Ketones, ur: 40 mg/dL — AB
Nitrite: NEGATIVE
Protein, ur: NEGATIVE mg/dL
Urobilinogen, UA: 0.2 mg/dL (ref 0.0–1.0)

## 2010-09-19 ENCOUNTER — Encounter: Payer: Self-pay | Admitting: Family Medicine

## 2010-09-19 ENCOUNTER — Emergency Department (HOSPITAL_COMMUNITY): Payer: Medicaid Other

## 2010-09-19 ENCOUNTER — Inpatient Hospital Stay (HOSPITAL_COMMUNITY)
Admission: EM | Admit: 2010-09-19 | Discharge: 2010-09-22 | DRG: 074 | Disposition: A | Discharge status: Home / Self Care | Payer: Medicaid Other | Attending: Family Medicine | Admitting: Family Medicine

## 2010-09-19 DIAGNOSIS — E1065 Type 1 diabetes mellitus with hyperglycemia: Principal | ICD-10-CM | POA: Diagnosis present

## 2010-09-19 DIAGNOSIS — E1049 Type 1 diabetes mellitus with other diabetic neurological complication: Principal | ICD-10-CM | POA: Diagnosis present

## 2010-09-19 DIAGNOSIS — I10 Essential (primary) hypertension: Secondary | ICD-10-CM | POA: Diagnosis present

## 2010-09-19 DIAGNOSIS — Z91199 Patient's noncompliance with other medical treatment and regimen due to unspecified reason: Secondary | ICD-10-CM

## 2010-09-19 DIAGNOSIS — E1069 Type 1 diabetes mellitus with other specified complication: Secondary | ICD-10-CM | POA: Diagnosis present

## 2010-09-19 DIAGNOSIS — F17200 Nicotine dependence, unspecified, uncomplicated: Secondary | ICD-10-CM

## 2010-09-19 DIAGNOSIS — Z9119 Patient's noncompliance with other medical treatment and regimen: Secondary | ICD-10-CM

## 2010-09-19 DIAGNOSIS — E870 Hyperosmolality and hypernatremia: Secondary | ICD-10-CM | POA: Diagnosis present

## 2010-09-19 DIAGNOSIS — IMO0002 Reserved for concepts with insufficient information to code with codable children: Secondary | ICD-10-CM | POA: Diagnosis present

## 2010-09-19 DIAGNOSIS — E876 Hypokalemia: Secondary | ICD-10-CM | POA: Diagnosis not present

## 2010-09-19 DIAGNOSIS — R112 Nausea with vomiting, unspecified: Secondary | ICD-10-CM

## 2010-09-19 DIAGNOSIS — Z794 Long term (current) use of insulin: Secondary | ICD-10-CM

## 2010-09-19 DIAGNOSIS — L97509 Non-pressure chronic ulcer of other part of unspecified foot with unspecified severity: Secondary | ICD-10-CM | POA: Diagnosis present

## 2010-09-19 DIAGNOSIS — K3184 Gastroparesis: Secondary | ICD-10-CM | POA: Diagnosis present

## 2010-09-19 LAB — CBC
HCT: 37.7 % (ref 36.0–46.0)
Hemoglobin: 12.7 g/dL (ref 12.0–15.0)
MCHC: 33.7 g/dL (ref 30.0–36.0)
MCV: 89.8 fL (ref 78.0–100.0)
RDW: 15.3 % (ref 11.5–15.5)
WBC: 17.1 10*3/uL — ABNORMAL HIGH (ref 4.0–10.5)

## 2010-09-19 LAB — COMPREHENSIVE METABOLIC PANEL
Albumin: 4.2 g/dL (ref 3.5–5.2)
BUN: 12 mg/dL (ref 6–23)
Chloride: 101 mEq/L (ref 96–112)
Creatinine, Ser: 0.76 mg/dL (ref 0.50–1.10)
GFR calc Af Amer: >60 mL/min (ref >60)
GFR calc non Af Amer: >60 mL/min (ref >60)
Glucose, Bld: 358 mg/dL — ABNORMAL HIGH (ref 70–99)
Total Bilirubin: 0.9 mg/dL (ref 0.3–1.2)

## 2010-09-19 LAB — GLUCOSE, CAPILLARY
Glucose-Capillary: 113 mg/dL — ABNORMAL HIGH (ref 70–99)
Glucose-Capillary: 331 mg/dL — ABNORMAL HIGH (ref 70–99)
Glucose-Capillary: 383 mg/dL — ABNORMAL HIGH (ref 70–99)

## 2010-09-19 LAB — URINE MICROSCOPIC-ADD ON

## 2010-09-19 LAB — URINALYSIS, ROUTINE W REFLEX MICROSCOPIC
Leukocytes, UA: NEGATIVE
Protein, ur: NEGATIVE mg/dL
Urobilinogen, UA: 0.2 mg/dL (ref 0.0–1.0)

## 2010-09-19 LAB — LIPASE, BLOOD: Lipase: 9 U/L — ABNORMAL LOW (ref 11–59)

## 2010-09-19 MED ORDER — IOHEXOL 300 MG/ML  SOLN
80.0000 mL | Freq: Once | INTRAMUSCULAR | Status: AC | PRN
  Administered 2010-09-19: 80 mL via INTRAVENOUS

## 2010-09-19 NOTE — H&P (Signed)
Family Medicine Teaching [facility] Admission History and Physical  Patient name: [name] Medical record number: [id number] Date of birth: 1980-10-15 Age: 30 y.o. Gender: female  Primary Care Provider: Clementeen Graham, MD  Chief Complaint: Vomiting  History of Present Illness: [name] is a 30 y.o. year old African-American female with T1DM presenting to the ED twice today  with vomiting x 2-3 days. The emesis became blood tinged today. However she denies any frank blood or coffee ground emesis.  Pt states she has nausea & vomiting, subjective fevers & chills, and epigastric abdominal pain. Pt also describes diaphoresis, palpitations and headaches within the same time period.  Pt says that she has had some diarrhea. Pt denies constipation or any cough or congestion. Pt states that she tries to take her insulin as directed but is not always able to do so. Pt was found at ED to have hyperglycemia to 350's.  ED Course: Pt was given 4mg  IV Zofran x 2 with no relief of sx and 25mg  IV Phenergan x 1, and 10mg  IV Reglan x 1. Pt also received 1L NS Bolus, IVFs and Novolog Insulin.  Patient Active Problem List  Diagnoses  . DIABETES-TYPE 1  . HYPERCHOLESTEROLEMIA  . TOBACCO USER  . TRIGGER FINGER, RIGHT THUMB  . VAGINAL DISCHARGE  . Thrombophlebitis of arm, right  . UTI (lower urinary tract infection)   Past Medical History: Past Medical History  Diagnosis Date  . Hyperlipidemia   . Hypertension   . Diabetes mellitus     A1c > 11. Patient does not check BG or take insulin regularly.  Dx at age 61.  . Tobacco abuse   . Incarceration     Was in jail for selling drugs - out Oct 2010.    Past Surgical History: Past Surgical History  Procedure Date  . Tubal ligation 2009    Social History: History   Social History  . Marital Status: Single    Spouse Name: N/A    Number of Children: N/A  . Years of Education: N/A   Social History Main Topics  . Smoking status:  Current Everyday Smoker -- 0.3 packs/day    Types: Cigarettes  . Smokeless tobacco: Never Used  . Alcohol Use: Yes     Occasional.  . Drug Use: No  . Sexually Active: Yes   Other Topics Concern  . Not on file   Social History Narrative  . No narrative on file    Family History: Non contributory  Allergies: NKDA  No current facility-administered medications for this visit.   Current Outpatient Prescriptions  Medication Sig Dispense Refill  . aspirin 81 MG tablet Take 81 mg by mouth daily.        . Blood Glucose Monitoring Suppl (BD LATITUDE DIABETES SYSTEM) KIT Per instructions       . fluconazole (DIFLUCAN) 150 MG tablet Take 1 tablet (150 mg total) by mouth once.  1 tablet  0  . fluconazole (DIFLUCAN) 150 MG tablet Take 1 tablet (150 mg total) by mouth once. Then again 1 week later.  2 tablet  0  . insulin glargine (LANTUS) 100 UNIT/ML injection Inject 45 units every morning.  10 mL  3  . Insulin Pen Needle (BD ULTRA-FINE PEN NEEDLES) 29G X 12.7MM MISC Inject 1 Device into the skin 4 (four) times daily -  before meals and at bedtime. use as directed with insulin pens  120 each  3  . simvastatin (ZOCOR) 40 MG tablet  Take 40 mg by mouth daily.        Facility-Administered Medications Ordered in Other Visits  Medication Dose Route Frequency Provider Last Rate Last Dose  . iohexol (OMNIPAQUE) 300 MG/ML injection 80 mL  80 mL Intravenous Once PRN Medication Radiologist   80 mL at 09/19/10 1151   Review Of Systems: Per HPI with the following additions:  Otherwise 12 point review of systems was performed and was unremarkable.  Physical Exam: Pulse: 92  Blood Pressure: 167/106 RR: 15   O2: 100 on RA Temp: 98.7  General: Pt was lying on the hospital bed and had her eyes closed. NAD Heart: RRR, no murmurs/rubs/gallops Lungs: CTAB, no wheezes/rales/rhonchi Abdomen: Nl BS x 4; Moderately TTP in LUQ, no rebounding or guarding, no flank pain Extremities: 1 cm ulcer on the tip f L  great toe, no obvious seeping of fluid- . R 1st MTP small dry ulcer. Psych: Aox3 initially refused exam and then subsequently allowed exam and admission.    Labs and Imaging: Lab Results  Component Value Date/Time   NA 146* 09/19/2010  9:08 AM   K 4.7 09/19/2010  9:08 AM   CL 101 09/19/2010  9:08 AM   CO2 27 09/19/2010  9:08 AM   BUN 12 09/19/2010  9:08 AM   CREATININE 0.76 09/19/2010  9:08 AM   CREATININE 0.76 04/19/2010 12:56 PM   GLUCOSE 358* 09/19/2010  9:08 AM   Lab Results  Component Value Date   WBC 17.1* 09/19/2010   HGB 12.7 09/19/2010   HCT 37.7 09/19/2010   MCV 89.8 09/19/2010   PLT 286 09/19/2010   UA: SG 1.034, pH 6.0, Glucose >1000, Ketones >80, trace Hgb, negative nitrite/LE/bili/protein  CBG: 331, 290, 201  Acute Abdomen Series: No acute abdominal or pulmonary abnormality.  CT Abd and Pelvis w/ Contrast: No acute findings in the abdomen or pelvis.    Assessment and Plan: [name] is a 30 y.o. year old female presenting with vomiting and hyperglycemia. 1. Vomiting- pt has a history of uncontrolled T1DM and likely has gastroparesis contributing to her vomiting. We will continue with scheduled Zofran and Reglan and PRN Phenergan. We will also do a gastrooccult study to eval her blood in emesis. We will keep pt NPO until her vomiting improves and then advance her diet as tolerated. 2.  Hyperglycemia- Pt acknowledges that she is not always compliant with her insulin and HgbA1c from 04/24/10 was 11.3. Possible etiologies for current hyperglycemia are noncompliance given her history or acute infection. Pt does have an elevated WBC but has been afebrile since admission. We will follow her CBC. Pt is currently just on sips of liquid & ice chips so we will put her on moderate SSI with 5 units of Lantus. 3. FEN/GI- sips of liquid & ice chips; adv diet as tolerated; NS 114mL/hr 4. Prophylaxis- Heparin 5000units SQ TID 5. Disposition- Pending improvement of pt's emesis and  control of blood glucose.  [id number]

## 2010-09-20 LAB — CBC
HCT: 37 % (ref 36.0–46.0)
Hemoglobin: 12.1 g/dL (ref 12.0–15.0)
MCH: 29.6 pg (ref 26.0–34.0)
MCHC: 32.7 g/dL (ref 30.0–36.0)
MCV: 90.5 fL (ref 78.0–100.0)
RBC: 4.09 MIL/uL (ref 3.87–5.11)

## 2010-09-20 LAB — BASIC METABOLIC PANEL
BUN: 6 mg/dL (ref 6–23)
CO2: 26 mEq/L (ref 19–32)
Calcium: 9 mg/dL (ref 8.4–10.5)
Chloride: 103 mEq/L (ref 96–112)
Creatinine, Ser: 0.73 mg/dL (ref 0.50–1.10)
GFR calc Af Amer: >60 mL/min (ref >60)

## 2010-09-20 LAB — GLUCOSE, CAPILLARY: Glucose-Capillary: 192 mg/dL — ABNORMAL HIGH (ref 70–99)

## 2010-09-20 LAB — RAPID URINE DRUG SCREEN, HOSP PERFORMED
Benzodiazepines: NOT DETECTED
Cocaine: NOT DETECTED
Opiates: NOT DETECTED

## 2010-09-20 LAB — DIFFERENTIAL
Basophils Relative: 0 % (ref 0–1)
Lymphocytes Relative: 12 % (ref 12–46)
Lymphs Abs: 1.6 10*3/uL (ref 0.7–4.0)
Monocytes Absolute: 1.4 10*3/uL — ABNORMAL HIGH (ref 0.1–1.0)
Monocytes Relative: 11 % (ref 3–12)
Neutro Abs: 10.3 10*3/uL — ABNORMAL HIGH (ref 1.7–7.7)
Neutrophils Relative %: 77 % (ref 43–77)

## 2010-09-20 LAB — GASTRIC OCCULT BLOOD (1-CARD TO LAB): Occult Blood, Gastric: POSITIVE — AB

## 2010-09-20 LAB — COMPREHENSIVE METABOLIC PANEL
ALT: 15 U/L (ref 0–35)
BUN: 8 mg/dL (ref 6–23)
CO2: 28 mEq/L (ref 19–32)
Calcium: 9.4 mg/dL (ref 8.4–10.5)
GFR calc Af Amer: >60 mL/min (ref >60)
GFR calc non Af Amer: >60 mL/min (ref >60)
Glucose, Bld: 154 mg/dL — ABNORMAL HIGH (ref 70–99)
Sodium: 149 mEq/L — ABNORMAL HIGH (ref 135–145)
Total Protein: 7.8 g/dL (ref 6.0–8.3)

## 2010-09-20 LAB — HEMOGLOBIN A1C: Hgb A1c MFr Bld: 10.2 % — ABNORMAL HIGH (ref <5.7)

## 2010-09-21 LAB — GLUCOSE, CAPILLARY
Glucose-Capillary: 152 mg/dL — ABNORMAL HIGH (ref 70–99)
Glucose-Capillary: 163 mg/dL — ABNORMAL HIGH (ref 70–99)
Glucose-Capillary: 303 mg/dL — ABNORMAL HIGH (ref 70–99)

## 2010-09-22 LAB — BASIC METABOLIC PANEL
CO2: 32 mEq/L (ref 19–32)
Calcium: 8.9 mg/dL (ref 8.4–10.5)
Calcium: 9.4 mg/dL (ref 8.4–10.5)
Creatinine, Ser: 0.76 mg/dL (ref 0.50–1.10)
GFR calc Af Amer: >60 mL/min (ref >60)
GFR calc Af Amer: >60 mL/min (ref >60)
GFR calc non Af Amer: >60 mL/min (ref >60)
GFR calc non Af Amer: >60 mL/min (ref >60)
Sodium: 137 mEq/L (ref 135–145)

## 2010-09-22 LAB — GLUCOSE, CAPILLARY
Glucose-Capillary: 138 mg/dL — ABNORMAL HIGH (ref 70–99)
Glucose-Capillary: 210 mg/dL — ABNORMAL HIGH (ref 70–99)
Glucose-Capillary: 242 mg/dL — ABNORMAL HIGH (ref 70–99)
Glucose-Capillary: 268 mg/dL — ABNORMAL HIGH (ref 70–99)
Glucose-Capillary: 294 mg/dL — ABNORMAL HIGH (ref 70–99)

## 2010-09-22 LAB — CBC
MCH: 29.9 pg (ref 26.0–34.0)
MCHC: 33.6 g/dL (ref 30.0–36.0)
Platelets: 256 10*3/uL (ref 150–400)
RDW: 14.3 % (ref 11.5–15.5)

## 2010-09-22 LAB — MAGNESIUM: Magnesium: 2 mg/dL (ref 1.5–2.5)

## 2010-09-30 ENCOUNTER — Encounter: Payer: Self-pay | Admitting: Pharmacist

## 2010-09-30 ENCOUNTER — Ambulatory Visit (INDEPENDENT_AMBULATORY_CARE_PROVIDER_SITE_OTHER): Payer: Medicaid Other | Admitting: Pharmacist

## 2010-09-30 VITALS — BP 112/73 | HR 67 | Ht 67.0 in | Wt 154.0 lb

## 2010-09-30 DIAGNOSIS — E109 Type 1 diabetes mellitus without complications: Secondary | ICD-10-CM

## 2010-09-30 LAB — BASIC METABOLIC PANEL
CO2: 27 mEq/L (ref 19–32)
Calcium: 9.4 mg/dL (ref 8.4–10.5)
Creat: 0.81 mg/dL (ref 0.50–1.10)

## 2010-09-30 MED ORDER — GLUCOSE BLOOD VI STRP: ORAL_STRIP | Status: DC

## 2010-09-30 MED ORDER — INSULIN ASPART 100 UNIT/ML ~~LOC~~ SOLN: SUBCUTANEOUS | Status: DC

## 2010-09-30 MED ORDER — INSULIN GLARGINE 100 UNIT/ML ~~LOC~~ SOLN: 26.0000 [IU] | Freq: Every day | SUBCUTANEOUS | Status: DC

## 2010-09-30 NOTE — Patient Instructions (Signed)
Change lantus to 26 units in the morning (start tomorrow AM). Same Novolog dose.

## 2010-09-30 NOTE — Assessment & Plan Note (Signed)
Diabetes of 20 yrs duration currently under improved control of blood glucose based on   Lab Results  Component Value Date   HGBA1C 10.2* 09/20/2010    ,home fasting CBG readings of 213 7 day average. Control is suboptimal due to limited exercise and diet however she is NOW claiming to be much more adherent with therapy.  Denies hypoglycemic events.  Able to verbalize appropriate hypoglycemia management plan. Adjusted dose of basal insulin Lantus (insulin glargine) to AM dosing of Lantus starting tomorrow AM. Continued rapid insulin Novolog (insulin aspart) to sliding scale of 09/09/10 units.  Written patient instructions provided.  Follow up in  Pharmacist Clinic Visit PRN as needed after next visit with Dr. Denyse Amass.  I would anticipate every other month visit to Rx clinic may help with education and adherence to medication regimen AND allow for adjustments as needed for improved control.  TTFFC 40 minutes.  Patient seen with Case Manager AND Alvia Grove, PharmD student.

## 2010-09-30 NOTE — Progress Notes (Signed)
  Subjective:    Patient ID: [name], female    DOB: 1980/08/23, 30 y.o.   MRN: [id number]  HPI Patient arrives for Follow up of recent hospitalization.  She is accompanied by a Medicaid Case Manager.  She has been checking BG at home and has 7 day average of 213.   She reports adherence with insulin (Lantus and Novolog).   She denies smoking since discharge.    She has several small wounds on her legs which appear to be unchanged per her report.  Review of Systems     Objective:   Physical Exam        Assessment & Plan:   Diabetes of 20 yrs duration currently under improved control of blood glucose based on   Lab Results  Component Value Date   HGBA1C 10.2* 09/20/2010    ,home fasting CBG readings of 213 7 day average. Control is suboptimal due to limited exercise and diet however she is NOW claiming to be much more adherent with therapy.  Denies hypoglycemic events.  Able to verbalize appropriate hypoglycemia management plan. Adjusted dose of basal insulin Lantus (insulin glargine) to AM dosing of Lantus starting tomorrow AM. Continued rapid insulin Novolog (insulin aspart) to sliding scale of 09/09/10 units.  Written patient instructions provided.  Follow up in  Pharmacist Clinic Visit PRN as needed after next visit with Dr. Denyse Amass.  I would anticipate every other month visit to Rx clinic may help with education and adherence to medication regimen AND allow for adjustments as needed for improved control.  TTFFC 40 minutes.  Patient seen with Case Manager AND Alvia Grove, PharmD student.

## 2010-10-03 NOTE — Progress Notes (Signed)
  Subjective:    Patient ID: [name], female    DOB: February 07, 1980, 30 y.o.   MRN: [id number]  HPI Reviewed and agree with Dr. Macky Lower management.    Review of Systems     Objective:   Physical Exam        Assessment & Plan:

## 2010-10-05 NOTE — H&P (Signed)
[name], [name]              ACCOUNT NO.:  [id number]  MEDICAL RECORD NO.:  [id number]  LOCATION:  3004                         FACILITY:  [facility]  PHYSICIAN:  [name], M.D.DATE OF BIRTH:  Apr 01, 1980  DATE OF ADMISSION:  09/19/2010 DATE OF DISCHARGE:                             HISTORY & PHYSICAL   CHIEF COMPLAINT:  Vomiting.  HISTORY OF PRESENT ILLNESS:  Ms. [name] is a 30 year old female with type 1 diabetes who presented to the emergency room 2 times in the last 24 hours with vomiting for 2-3 days.  She notes that she has had some blood in her emesis today.  However, she denies frank blood or coffee- ground emesis.  She notes that she has had nausea, vomiting, subjective fever, chills and epigastric abdominal pain in this illness.  She also notes diaphoresis, palpitations, and headaches.  She notes some diarrhea as well, but denies any blood in her stool.  Denies any constipation, cough or congestion.  She states that she has not been taking her insulin as directed missing several doses and not checking her sugars. In the emergency room, her blood glucose was 350s.  Also in the emergency room, she was given Zofran that did not help and Phenergan that did not help very much, Reglan 10 mg IV did help some.  She also received a liter of normal saline and NovoLog insulin.  MEDICAL HISTORY: 1. Type 1 diabetes for years. 2. Hypercholesterolemia. 3. Tobacco use. 4. Trigger finger on the right thumb. 5. Multiple recurrent UTIs and multiple recurrent bacterial vaginosis. 6. Thrombophlebitis of the right arm.  SURGICAL HISTORY:  Had tubal ligation in 2009.  SOCIAL HISTORY:  Single, current smoker, occasional marijuana use. Occasional alcohol use, is sexually active.  FAMILY HISTORY:  Noncontributory.  MEDICATIONS: 1. Aspirin 81. 2. Insulin 70/30, 38 units twice a day. 3. Simvastatin 40 nightly.  REVIEW OF SYSTEMS:  Please see HPI, otherwise normal.  PHYSICAL  EXAMINATION:  VITAL SIGNS:  Heart rate 92, blood pressure 167/106, respiratory rate is 115, saturation is 100% on room air, temperature 98.7. GENERAL:  Somnolent, but is arousable and will cooperate with exam. HEART:  Regular rhythm.  No murmurs, rubs, or gallops. LUNGS:  Clear to auscultation bilaterally.  No wheezes, rubs, or rales. ABDOMEN:  Soft, normoactive bowel sounds.  Mildly tender in the lower left quadrant.  No flank pain.  No masses.  EXTREMITIES:  Nonedematous, warm and well perfused.  She has a 1-cm ulcer on the tip of her left great toe.  It is not draining.  It does appear to be a little bit wet. There is no erythema surrounding.  It is nontender to the touch.  She also has on the right first MTP a dry ulcer medially, though that also has no erythema. PSYCH:  Alert and oriented x3, initially refused exam.  However, after 30 minutes, allowed exam and consented to admission.  LABS AND STUDIES:  Basic metabolic panel:  Sodium 148, potassium 4.7, chloride 101, bicarb 27, BUN 12, creatinine 0.76, and glucose 358.  CBC: White count 17, hemoglobin 12.7, platelets 286.  Urinalysis significant for 1000 glucose, 80 ketones, no nitrite or leukocyte esterase.  Acute  abdomen x-ray shows nothing acute and CT abdomen and pelvis with contrast shows nothing acute.  ASSESSMENT/PLAN:  A 30 year old female with vomiting and hyperglycemia. 1. Vomiting.  There is long, long, long history of uncontrolled type 1     diabetes.  Likely, she has gastroparesis which is contributing to     her vomiting.  CT scan was normal as is her lab studies so far.     Plan to continue scheduled Zofran and Reglan, as needed Phenergan.     Plan for Gastroccult study to evaluate for blood in emesis.  Plan     to use sips and chips until her vomiting improves, and advance her     diet as tolerated. 2. Hyperglycemia, not necessarily very compliant.  Plan to recheck a     hemoglobin A1c and keep the patient on  sliding scale insulin. 3. ID:  Elevated white cell count, but no obvious source of infection     and no fever.  We will recheck CBC in the morning, and if source of     infection becomes obvious, we will test and treat. 4. FEN/GI.  As above, sips and chips, and normal saline at 120 an     hour. 5. Prophylaxis.  We will use heparin. 6. Disposition.  Pending improvement on emesis and once blood glucose     is controlled.     Rachel Graham, MD   ______________________________ [name], M.D.    EC/MEDQ  D:  09/19/2010  T:  09/20/2010  Job:  [id number]  Electronically Signed by [name]  on 09/30/2010 09:07:59 AM Electronically Signed by Acquanetta Belling M.D. on 10/05/2010 08:21:59 AM

## 2010-10-11 ENCOUNTER — Inpatient Hospital Stay: Payer: Medicaid Other | Admitting: Family Medicine

## 2010-10-21 NOTE — Discharge Summary (Signed)
[name], [name]              ACCOUNT NO.:  [id number]  MEDICAL RECORD NO.:  [id number]  LOCATION:  3004                         FACILITY:  [facility]  PHYSICIAN:  Leighton Roach Gared Gillie, M.D.DATE OF BIRTH:  1980/08/05  DATE OF ADMISSION:  09/19/2010 DATE OF DISCHARGE:  09/22/2010                              DISCHARGE SUMMARY   PRIMARY CARE PHYSICIAN:  Clementeen Graham, MD at [facility].  DISCHARGE DIAGNOSES: 1. Nausea and vomiting. 2. Type 1 diabetes. 3. Hypertension. 4. Hypokalemia.  DISCHARGE MEDICATIONS: 1. Lisinopril 2.5 mg daily. 2. Metoclopramide 10 mg 30 minutes before meals 3 times a day, she is     scheduled for 2 days after discharge and as needed. 3. NovoLog FlexPen 5 units subcu with meals. 4. Nystatin 100,000 units/mL suspension, 5 mL by mouth every 6 hours     take for 2 days. 5. Ondansetron 8 mg tablets under the tongue every 8 hours as needed     for nausea. 6. Pantoprazole 40 mg by mouth b.i.d. 7. Simvastatin 40 mg by mouth at bedtime. 8. Multivitamin by mouth daily. 9. Lantus SoloSTAR pen 10 units subcu at bedtime.  Instructions, if     blood glucose is greater than 150, increase Lantus by one unit at     night. 10.Ultrafine insulin pen needle 5/16th inch 31 gauge.  LABS AT DISCHARGE/PERTINENT LABS:  CBC, white blood cell count was elevated at 17.1 at admission, but normal at 13.1 by discharge. Hemoglobin A1c 10.2.  BMET, sodium is elevated up to 149 normal at 137 by discharge, potassium was at low of 2.6 at discharge.  UA greater than 1000 glucose, greater than 80 ketones, no protein, otherwise within normal limits.  Gastroccult is positive.  Urine drug screen positive for THC.  PROCEDURES AND IMAGING: 1. Abdominal x-ray, no acute abnormalities. 2. CT abdomen and pelvis, no acute abnormalities.  HOSPITAL COURSE:  The patient is a 30 year old African American female who presented with 2-3 days of nausea and vomiting.  The patient had  not been able to tolerate any food and had not been taken her insulin regularly.  On admission to the ED, her glucose was 358. 1. Nausea and vomiting.  The patient presented with 2-3 day history of     nausea and vomiting and abdominal pain.  The patient was noticeably     dehydrated and had hypoglycemia.  We admitted the patient and     treated her symptoms with scheduled Reglan and Zofran.  The patient     had a history of having uncontrolled diabetes and we felt stronger     that she could have diabetic gastroparesis.  In addition, abdominal     imaging exams were negative.  The patient was able to tolerate     solid food by discharge and able to take p.o. medications.  We     recommended to the patient that she takes Reglan t.i.d. for 2 days     after discharge and then take as needed with future nausea.  We     also sent the patient home with dissolvable tablet Zofran and also     take p.r.n. nausea/vomiting.  We asked  her to call the family     practice center if she has future symptoms before going to the ED. 2. Type 1 diabetes mellitus.  The patient has diabetes for 20 years     and a history of being noncompliant with her insulin and followup.     The patient's hemoglobin A1c is 10.2.  I spoke with the patient at     length about needing to have consistent followup with her PCP and     being diligent about taking her medications.  After speaking with     the patient, she decided she wanted to switch her insulin regimen     for Lantus and NovoLog instead of the 70/30 insulin she had been on     at home.  She stated that it is easier for her to use the pen to     stick her insulin than to use the vials.  She thought she will be     able to be more complaint with insulin Pens.  The patient was sent     home on 10 units of Lantus at bedtime and 5 units of NovoLog q.a.c.     The patient should have outpatient titration with her PCP, Dr.     Denyse Amass as well as the pharmacist, Dr. Raymondo Band.   Her next appointment     is with Dr. Raymondo Band on September 30, 2010. 3. Hypertension.  The patient had blood pressure that ranged from 160     systolic while vomiting to 130s after symptoms had resolved.  The     patient would benefit the addition of an ACE inhibitor.  We started     her on 2.5 mg of lisinopril daily.  The patient should have this     medication titrated up as possible and her blood pressure as     follows.  We will also recommend checking her renal function and     potassium level that her visit with Dr. Raymondo Band.  The patient is     status post BTL. 4. Electrolyte abnormality,     a.     Hypokalemia.  The patient's potassium decreased though with      the stay going from the normal level to 2.6 at discharge.  This is      mostly due to the patient's consistent vomiting, anorexia, and      inability to sustain good peripheral IV access.  The patient's      potassium is 2.6 at discharge.  We treated the patient with 40 mEq      of potassium p.o. x2 before discharge, but the patient left before      we were able to recheck a potassium.  At the patient's first      followup appointment, the patient should have her potassium      rechecked.  The patient did not exhibit any respiratory muscle      weakness at discharge.  The patient's potassium will likely      correct itself with improved eating and may be with an ACE      inhibitor as well.     b.     Hyponatremia.  The patient's sodium was elevated to high of      149 during the hospitalization secondary to vomiting, dehydration      and hypoglycemia.  We changed her IV fluids to half normal saline      and the patient's sodium was 137  by day of discharge.  CONDITION AT THE TIME OF DISCHARGE:  At discharge, the patient was able to tolerate solid foods without complaint.  She was also able to tolerate p.o. medications.  She was no longer vomiting and her abdominal pain has resolved.  We discharged her home on 2 days of Reglan  t.i.d. every morning and then to continue p.r.n. nausea for further episodes. We also wrote prescriptions for Zofran ODT to be used p.r.n.  The patient was given new prescription for 10 units of Lantus at bedtime using a SoloSTAR Pen and 5 units q.a.c. of NovoLog using the Pen. Lastly, the patient was started on lisinopril 2.5 mg daily.  PENDING TESTS RESULTS:  H. pylori to routed to Dr. Denyse Amass when completed.  DISCHARGE FOLLOWUP: 1. Dr. Raymondo Band at the [facility].  The appointment is on     September 30, 2010, at 10:45 a.m. 2. Dr. Clementeen Graham at the [facility], that appointment is     on October 11, 2010, at 2 p.m.    ______________________________ Everrett Coombe, MD   ______________________________ Leighton Roach Joesph Marcy, M.D.    CM/MEDQ  D:  09/22/2010  T:  09/23/2010  Job:  [id number]  Electronically Signed by Everrett Coombe MD on 10/18/2010 03:08:01 PM Electronically Signed by Acquanetta Belling M.D. on 10/21/2010 03:08:35 PM

## 2010-11-04 LAB — POCT PREGNANCY, URINE
Operator id: 285491
Preg Test, Ur: NEGATIVE

## 2010-11-04 LAB — URINALYSIS, ROUTINE W REFLEX MICROSCOPIC
Nitrite: NEGATIVE
Protein, ur: NEGATIVE
Specific Gravity, Urine: 1.041 — ABNORMAL HIGH
Urobilinogen, UA: 0.2

## 2010-11-04 LAB — CBC
Hemoglobin: 13.8
MCHC: 33.2
Platelets: 260
RDW: 14.5

## 2010-11-04 LAB — BASIC METABOLIC PANEL
BUN: 9
CO2: 23
Calcium: 9.7
GFR calc non Af Amer: >60
Glucose, Bld: 517
Potassium: 4
Sodium: 127 — ABNORMAL LOW

## 2010-11-04 LAB — URINE MICROSCOPIC-ADD ON

## 2010-11-04 LAB — DIFFERENTIAL
Basophils Absolute: 0
Basophils Relative: 1
Eosinophils Relative: 3
Monocytes Absolute: 0.4
Neutro Abs: 3.1

## 2010-11-08 ENCOUNTER — Encounter: Payer: Self-pay | Admitting: Family Medicine

## 2010-11-08 ENCOUNTER — Ambulatory Visit (INDEPENDENT_AMBULATORY_CARE_PROVIDER_SITE_OTHER): Payer: Medicaid Other | Admitting: Family Medicine

## 2010-11-08 VITALS — BP 122/84 | HR 82 | Wt 156.2 lb

## 2010-11-08 DIAGNOSIS — Z23 Encounter for immunization: Secondary | ICD-10-CM

## 2010-11-08 DIAGNOSIS — E109 Type 1 diabetes mellitus without complications: Secondary | ICD-10-CM

## 2010-11-08 DIAGNOSIS — R112 Nausea with vomiting, unspecified: Secondary | ICD-10-CM | POA: Insufficient documentation

## 2010-11-08 DIAGNOSIS — N898 Other specified noninflammatory disorders of vagina: Secondary | ICD-10-CM

## 2010-11-08 LAB — POCT WET PREP (WET MOUNT)

## 2010-11-08 MED ORDER — LISINOPRIL 2.5 MG PO TABS: 2.5000 mg | ORAL_TABLET | Freq: Every day | ORAL | Status: DC

## 2010-11-08 MED ORDER — METOCLOPRAMIDE HCL 5 MG PO TABS: 5.0000 mg | ORAL_TABLET | Freq: Four times a day (QID) | ORAL | Status: DC | PRN

## 2010-11-08 MED ORDER — METRONIDAZOLE 500 MG PO TABS: 500.0000 mg | ORAL_TABLET | Freq: Two times a day (BID) | ORAL | Status: DC

## 2010-11-08 MED ORDER — SIMVASTATIN 40 MG PO TABS: 40.0000 mg | ORAL_TABLET | Freq: Every day | ORAL | Status: DC

## 2010-11-08 NOTE — Patient Instructions (Signed)
Thank you for coming in today. Use the Reglan every 6 hours as needed if you start getting sick to your stomach. Come see me at the end of November or early December. Keep taking your insulin. Keep track of your sugars. I will call you about your wet prep tomorrow.

## 2010-11-08 NOTE — Assessment & Plan Note (Signed)
Very poorly controlled overall. Somewhat improved recently following the hospitalization.  Currently on lantus and novolog SSI.  Will continue current therapy and follow up in 2 months for A1c recheck.

## 2010-11-08 NOTE — Assessment & Plan Note (Signed)
Diagnosed with BV. Will treat with metronidazole for 7 days. Followup when necessary

## 2010-11-08 NOTE — Progress Notes (Signed)
[name] presents to clinic today for hospital followup and yeast infection.  1) diabetes: Was seen at [facility] with hyperglycemia. Blood sugar controlled and Pt discharged. Since then she has done well with blood sugars running in the 100s-200s. She denies any hypoglycemic episodes.   2) Vomiting: Was seen in the hospital for vomiting. This is controlled with Reglan. She was discharged scheduled Reglan which has run out she feels much better. No further vomiting.  3) yeast infection. Noted vaginal itching last several days. Noted vaginal bleeding started again yesterday. Denies any odor. Does note some white discharge. This feels like typical yeast infection.  PMH reviewed.  ROS as above otherwise neg Medications reviewed.  Exam:  BP 122/84  Pulse 82  Wt 156 lb 3.2 oz (70.852 kg)  LMP 10/31/2010 Gen: Well NAD HEENT: EOMI,  MMM Lungs: CTABL Nl WOB Heart: RRR no MRG Abd: NABS, NT, ND Exts: Non edematous BL  LE, warm and well perfused.  Bilateral feet 4/4 monofilament. No ulcers present.

## 2010-11-08 NOTE — Assessment & Plan Note (Signed)
Intermittent and likely will be a recurring problem. 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 Have refilled reglan for use when nausea and vomiting start back. Will follow.

## 2010-11-09 LAB — URINALYSIS, ROUTINE W REFLEX MICROSCOPIC
Glucose, UA: >1000 — AB
Leukocytes, UA: NEGATIVE
pH: 6

## 2010-11-09 LAB — URINE MICROSCOPIC-ADD ON

## 2010-11-09 LAB — GC/CHLAMYDIA PROBE AMP, GENITAL: GC Probe Amp, Genital: NEGATIVE

## 2010-11-09 LAB — POCT PREGNANCY, URINE: Operator id: 120561

## 2010-11-23 ENCOUNTER — Other Ambulatory Visit: Payer: Self-pay | Admitting: Family Medicine

## 2010-11-23 ENCOUNTER — Encounter: Payer: Self-pay | Admitting: Family Medicine

## 2010-11-23 ENCOUNTER — Ambulatory Visit (INDEPENDENT_AMBULATORY_CARE_PROVIDER_SITE_OTHER): Payer: Medicaid Other | Admitting: Family Medicine

## 2010-11-23 DIAGNOSIS — E1065 Type 1 diabetes mellitus with hyperglycemia: Secondary | ICD-10-CM

## 2010-11-23 DIAGNOSIS — IMO0002 Reserved for concepts with insufficient information to code with codable children: Secondary | ICD-10-CM

## 2010-11-23 DIAGNOSIS — N390 Urinary tract infection, site not specified: Secondary | ICD-10-CM

## 2010-11-23 DIAGNOSIS — R300 Dysuria: Secondary | ICD-10-CM

## 2010-11-23 DIAGNOSIS — N898 Other specified noninflammatory disorders of vagina: Secondary | ICD-10-CM

## 2010-11-23 LAB — POCT URINALYSIS DIPSTICK
Glucose, UA: NEGATIVE
Ketones, UA: NEGATIVE
Spec Grav, UA: 1.02

## 2010-11-23 LAB — POCT UA - MICROSCOPIC ONLY

## 2010-11-23 MED ORDER — CEPHALEXIN 500 MG PO CAPS: 500.0000 mg | ORAL_CAPSULE | Freq: Two times a day (BID) | ORAL | Status: AC

## 2010-11-23 MED ORDER — FLUCONAZOLE 150 MG PO TABS: 150.0000 mg | ORAL_TABLET | Freq: Once | ORAL | Status: AC

## 2010-11-23 MED ORDER — METRONIDAZOLE 500 MG PO TABS: 500.0000 mg | ORAL_TABLET | Freq: Two times a day (BID) | ORAL | Status: AC

## 2010-11-23 NOTE — Assessment & Plan Note (Signed)
Blood sugar control is improved. I expect her A1c to be 9 or less in one month when we check it. I recommend that she followup with Dr. Raymondo Band between now and one month for further titration of her NovoLog. Additionally recommended that she bring her glucose meter to the Dr. Raymondo Band visit and 2 subsequent visits with myself. Followup in one month

## 2010-11-23 NOTE — Progress Notes (Signed)
Ms. Joost presents to clinic today for urinary tract infection, vaginal discharge, and diabetes.  1) urinary tract infection.  Starting a few days ago she noted that abdominal pain and foul-smelling urine with dysuria. She denies any fevers or chills. She says this is just like her previous urinary tract infections. She feels well otherwise. She additionally request fluconazole as she usually gets a yeast infection following antibiotics.   2) vaginal discharge: Was seen [facility] the [facility] 2 weeks ago for the same problem. She could not wait for her wet prep results, which showed bacterial vaginosis. I was unable to get in touch with her as her phone number was incorrect. She never did pick up the metronidazole that was called in to [facility].  Her discharge has continued at this point. She feels well.  3) diabetes: Really taking Lantus 30 units and NovoLog 6-12 units with meals. Fasting glucose usually is in the 100s. This week has been as high as the 200s. Max glucoses last few weeks his 340s.  She denies any hypoglycemic episodes.  PMH reviewed.  ROS as above otherwise neg Medications reviewed.  Exam:  BP 107/60  Pulse 62  Ht 5\' 7"  (1.702 m)  Wt 153 lb (69.4 kg)  BMI 23.96 kg/m2  LMP 10/31/2010 Gen: Well NAD HEENT: EOMI,  MMM Lungs: CTABL Nl WOB Heart: RRR no MRG Abd: NABS, NT, ND Exts: Non edematous BL  LE, warm and well perfused.

## 2010-11-23 NOTE — Patient Instructions (Signed)
Thank you for coming in today. I will call [phone] and leave a message about your labs and prescriptions.  See me at the end of November or Early December.  Get back with Dr. Raymondo Band ASAP and bring your meter.  Good luck.

## 2010-11-23 NOTE — Assessment & Plan Note (Signed)
Certainly still bacteria vaginosis as it remains untreated Call in Metronidazole. Deferred Her  vaginal exam today as this is a continuation of an untreated problem. Additionally we'll call in fluconazole as she usually gets a yeast infection following oral antibiotics. Followup this issue in one month. I reiterated that quitting smoking will help prevent bacterial vaginosis.  Additionally I noted that better diabetes control will reduce episodes of bacterial vaginosis

## 2010-11-23 NOTE — Assessment & Plan Note (Signed)
Diagnosed today on urinalysis. Plan to treat with Keflex. As this is a recurrent urinary tract infection I will culture urine to ensure that the bacteria is sensitive to this agent. Followup in one month

## 2010-11-23 NOTE — Progress Notes (Deleted)
  Subjective:    Patient ID: [name], female    DOB: 1980/12/23, 30 y.o.   MRN: [id number]  HPI    Review of Systems     Objective:   Physical Exam        Assessment & Plan:

## 2010-11-24 NOTE — Telephone Encounter (Signed)
Refill request

## 2010-11-25 LAB — URINE CULTURE

## 2011-02-06 ENCOUNTER — Ambulatory Visit: Payer: Medicaid Other

## 2011-02-17 ENCOUNTER — Ambulatory Visit (INDEPENDENT_AMBULATORY_CARE_PROVIDER_SITE_OTHER): Payer: Medicaid Other | Admitting: Family Medicine

## 2011-02-17 ENCOUNTER — Other Ambulatory Visit (HOSPITAL_COMMUNITY)
Admission: RE | Admit: 2011-02-17 | Discharge: 2011-02-17 | Disposition: A | Discharge status: Home / Self Care | Payer: Medicaid Other | Source: Ambulatory Visit | Attending: Family Medicine | Admitting: Family Medicine

## 2011-02-17 ENCOUNTER — Encounter: Payer: Self-pay | Admitting: Family Medicine

## 2011-02-17 VITALS — BP 106/70 | HR 87 | Ht 67.0 in | Wt 153.7 lb

## 2011-02-17 DIAGNOSIS — N72 Inflammatory disease of cervix uteri: Secondary | ICD-10-CM

## 2011-02-17 DIAGNOSIS — N898 Other specified noninflammatory disorders of vagina: Secondary | ICD-10-CM

## 2011-02-17 DIAGNOSIS — N912 Amenorrhea, unspecified: Secondary | ICD-10-CM

## 2011-02-17 DIAGNOSIS — E1065 Type 1 diabetes mellitus with hyperglycemia: Secondary | ICD-10-CM

## 2011-02-17 DIAGNOSIS — IMO0002 Reserved for concepts with insufficient information to code with codable children: Secondary | ICD-10-CM

## 2011-02-17 DIAGNOSIS — Z124 Encounter for screening for malignant neoplasm of cervix: Secondary | ICD-10-CM | POA: Insufficient documentation

## 2011-02-17 DIAGNOSIS — N390 Urinary tract infection, site not specified: Secondary | ICD-10-CM

## 2011-02-17 DIAGNOSIS — Z01419 Encounter for gynecological examination (general) (routine) without abnormal findings: Secondary | ICD-10-CM | POA: Insufficient documentation

## 2011-02-17 DIAGNOSIS — R300 Dysuria: Secondary | ICD-10-CM

## 2011-02-17 LAB — POCT UA - MICROSCOPIC ONLY

## 2011-02-17 LAB — POCT URINALYSIS DIPSTICK
Glucose, UA: NEGATIVE
Ketones, UA: NEGATIVE
Leukocytes, UA: NEGATIVE
Spec Grav, UA: 1.015
Urobilinogen, UA: 0.2

## 2011-02-17 LAB — POCT WET PREP (WET MOUNT)
WBC, Wet Prep HPF POC: <5
Yeast Wet Prep HPF POC: NEGATIVE

## 2011-02-17 MED ORDER — METRONIDAZOLE 500 MG PO TABS: 500.0000 mg | ORAL_TABLET | Freq: Two times a day (BID) | ORAL | Status: AC

## 2011-02-17 MED ORDER — CEPHALEXIN 500 MG PO CAPS: 500.0000 mg | ORAL_CAPSULE | Freq: Two times a day (BID) | ORAL | Status: AC

## 2011-02-17 MED ORDER — FLUCONAZOLE 150 MG PO TABS: 150.0000 mg | ORAL_TABLET | Freq: Once | ORAL | Status: AC

## 2011-02-17 NOTE — Assessment & Plan Note (Signed)
Clue cells noted on wet prep. Will prescribe metronidazole. Additionally she routinely gets yeast infections following antibiotic therapy so prescribe fluconazole as well.

## 2011-02-17 NOTE — Assessment & Plan Note (Signed)
[name] is working hard to better her diabetes control.  However it is still uncontrolled with an A1c of 11.8.  Additionally she is having morning hypoglycemic episodes that are very concerning for me.  Plan: Decrease Lantus as this may prevent morning hypoglycemia. Will decrease to 25 units daily/ Increase NovoLog to 8-10 units with meals. Stressed the importance of taking NovoLog with every meal.. Followup in one month at followup is essential for good glucose control. [name] expresses understanding and will followup soon

## 2011-02-17 NOTE — Assessment & Plan Note (Signed)
Pap smear obtained today for cervical cancer screening.

## 2011-02-17 NOTE — Progress Notes (Signed)
[name] presents to clinic today to obtain a Pap smear and to discuss her diabetes new vaginal discharge and suspicion for urinary tract infection.  1) diabetes: Currently taking Lantus 30 units at night and NovoLog 6-8 units with some meals. She does have a NovoLog pen but has not carried it with her. She will often misses giving insulin at some meals because she does not have her insulin with her. She notes that her usual glucose is between 180 and 260. However she often has hypoglycemic episodes in the morning often which sugars less than 60. She is symptomatic with these episodes with shaking.  She denies any polyuria or polydipsia.  2) vaginal discharge: Starting this week associated with some vaginal itching.  Additionally she would like a gonorrhea and chlamydia test with today's visit.   She denies any abdominal pain.  3) suspicious for urinary tract infection. She notes cloudy or foul-smelling urine. She denies any fevers or chills abdominal pain nausea vomiting diarrhea. Additionally she denies any burning with urination hesitancy frequency or urgency.  She has many urinary tract infections.  PMH reviewed.  ROS as above otherwise neg Medications reviewed. Current Outpatient Prescriptions  Medication Sig Dispense Refill  . aspirin 81 MG tablet Take 81 mg by mouth daily.        . cephALEXin (KEFLEX) 500 MG capsule Take 1 capsule (500 mg total) by mouth 2 (two) times daily.  20 capsule  0  . fluconazole (DIFLUCAN) 150 MG tablet Take 1 tablet (150 mg total) by mouth once.  1 tablet  1  . glucose blood (ACCU-CHEK AVIVA) test strip Use as instructed  100 each  12  . insulin aspart (NOVOLOG FLEXPEN) 100 UNIT/ML injection Use 3-12 units with meals per Sliding scale  15 Syringe  12  . insulin glargine (LANTUS SOLOSTAR) 100 UNIT/ML injection Inject 30 Units into the skin at bedtime.  15 Syringe  12  . Insulin Pen Needle (BD ULTRA-FINE PEN NEEDLES) 29G X 12.7MM MISC Inject 1 Device into the skin  4 (four) times daily -  before meals and at bedtime. use as directed with insulin pens  120 each  3  . lisinopril (PRINIVIL,ZESTRIL) 2.5 MG tablet Take 1 tablet (2.5 mg total) by mouth daily.  30 tablet  2  . metoCLOPramide (REGLAN) 5 MG tablet Take 1 tablet (5 mg total) by mouth every 6 (six) hours as needed (nausea).  120 tablet  1  . metroNIDAZOLE (FLAGYL) 500 MG tablet Take 1 tablet (500 mg total) by mouth 2 (two) times daily.  14 tablet  0  . simvastatin (ZOCOR) 40 MG tablet Take 1 tablet (40 mg total) by mouth at bedtime.  30 tablet  2   she is not taking aspirin simvastatin or lisinopril.  Exam:  BP 106/70  Pulse 87  Ht 5\' 7"  (1.702 m)  Wt 153 lb 11.2 oz (69.718 kg)  BMI 24.07 kg/m2  LMP 12/31/2010 Gen: Well NAD HEENT: EOMI,  MMM Lungs: CTABL Nl WOB Heart: RRR no MRG Abd: NABS, NT, ND Exts: Non edematous BL  LE, warm and well perfused.  GYN: Normal external genitalia. Normal vaginal canal with clumpy white discharge normal cervix.

## 2011-02-17 NOTE — Patient Instructions (Signed)
Thank you for coming in today. Decrease your lantus to 25 units at night.  Increase your novolog by 2-4 units with meals.  See me in 1 month to follow up your diabetes.  Stop by the lab on your way out for a finger stick. Complete all antibiotics.  I will send you a letter or call about your results.  Think about birth control options.  I recommend the Implanon for you.

## 2011-02-17 NOTE — Assessment & Plan Note (Signed)
Urinalysis significant for urinary tract infection. We'll treat apparently with Keflex for 10 days. However she has had multiple treatments with Keflex in the past therefore we will obtain a urine culture to ensure sensitivity to this cephalosporin.

## 2011-02-18 LAB — GC/CHLAMYDIA PROBE AMP, GENITAL: GC Probe Amp, Genital: NEGATIVE

## 2011-03-10 ENCOUNTER — Ambulatory Visit (INDEPENDENT_AMBULATORY_CARE_PROVIDER_SITE_OTHER): Payer: Medicaid Other | Admitting: Family Medicine

## 2011-03-10 ENCOUNTER — Encounter: Payer: Self-pay | Admitting: Family Medicine

## 2011-03-10 VITALS — BP 108/75 | HR 78 | Temp 98.0°F | Ht 67.0 in

## 2011-03-10 DIAGNOSIS — N760 Acute vaginitis: Secondary | ICD-10-CM

## 2011-03-10 DIAGNOSIS — R300 Dysuria: Secondary | ICD-10-CM

## 2011-03-10 DIAGNOSIS — N898 Other specified noninflammatory disorders of vagina: Secondary | ICD-10-CM

## 2011-03-10 DIAGNOSIS — N390 Urinary tract infection, site not specified: Secondary | ICD-10-CM

## 2011-03-10 LAB — POCT UA - MICROSCOPIC ONLY: Epithelial cells, urine per micros: >20

## 2011-03-10 LAB — POCT URINALYSIS DIPSTICK
Nitrite, UA: NEGATIVE
Urobilinogen, UA: 0.2
pH, UA: 6

## 2011-03-10 LAB — POCT WET PREP (WET MOUNT)

## 2011-03-10 MED ORDER — METRONIDAZOLE 500 MG PO TABS: 500.0000 mg | ORAL_TABLET | Freq: Two times a day (BID) | ORAL | Status: AC

## 2011-03-10 MED ORDER — TERCONAZOLE 0.8 % VA CREA: 1.0000 | TOPICAL_CREAM | Freq: Every day | VAGINAL | Status: AC

## 2011-03-10 MED ORDER — SULFAMETHOXAZOLE-TRIMETHOPRIM 800-160 MG PO TABS: 1.0000 | ORAL_TABLET | Freq: Two times a day (BID) | ORAL | Status: AC

## 2011-03-10 NOTE — Patient Instructions (Signed)
Your meds have been sent to pharmacy. OK to use terazol after antibiotic for yeast infection. Make an appointment with Dr. Denyse Amass in one week for followup. If you have fever, chills, nausea, back pain then go to ER this weekend. Will call if culture shows any different bacteria.  Urinary Tract Infection Infections of the urinary tract can start in several places. A bladder infection (cystitis), a kidney infection (pyelonephritis), and a prostate infection (prostatitis) are different types of urinary tract infections (UTIs). They usually get better if treated with medicines (antibiotics) that kill germs. Take all the medicine until it is gone. You or your child may feel better in a few days, but TAKE ALL MEDICINE or the infection may not respond and may become more difficult to treat. HOME CARE INSTRUCTIONS   Drink enough water and fluids to keep the urine clear or pale yellow. Cranberry juice is especially recommended, in addition to large amounts of water.   Avoid caffeine, tea, and carbonated beverages. They tend to irritate the bladder.   Alcohol may irritate the prostate.   Only take over-the-counter or prescription medicines for pain, discomfort, or fever as directed by your caregiver.  To prevent further infections:  Empty the bladder often. Avoid holding urine for long periods of time.   After a bowel movement, women should cleanse from front to back. Use each tissue only once.   Empty the bladder before and after sexual intercourse.  FINDING OUT THE RESULTS OF YOUR TEST Not all test results are available during your visit. If your or your child's test results are not back during the visit, make an appointment with your caregiver to find out the results. Do not assume everything is normal if you have not heard from your caregiver or the medical facility. It is important for you to follow up on all test results. SEEK MEDICAL CARE IF:   There is back pain.   Your baby is older than  3 months with a rectal temperature of 100.5 F (38.1 C) or higher for more than 1 day.   Your or your child's problems (symptoms) are no better in 3 days. Return sooner if you or your child is getting worse.  SEEK IMMEDIATE MEDICAL CARE IF:   There is severe back pain or lower abdominal pain.   You or your child develops chills.   You have a fever.   Your baby is older than 3 months with a rectal temperature of 102 F (38.9 C) or higher.   Your baby is 4 months old or younger with a rectal temperature of 100.4 F (38 C) or higher.   There is nausea or vomiting.   There is continued burning or discomfort with urination.  MAKE SURE YOU:   Understand these instructions.   Will watch your condition.   Will get help right away if you are not doing well or get worse.  Document Released: 10/26/2004 Document Revised: 09/28/2010 Document Reviewed: 05/31/2006 [facility] Patient Information 2012 [place], [place].

## 2011-03-10 NOTE — Assessment & Plan Note (Signed)
Wet prep indicates recurrent BV. Gave rx for flagyl x 7 days. Discussed option of topical therapy for suppression if recurrences continue.

## 2011-03-10 NOTE — Progress Notes (Signed)
  Subjective:    Patient ID: [name], female    DOB: Jun 06, 1980, 31 y.o.   MRN: [id number]  HPI  1. Dysuria. Was treated empirically for UTI 3 weeks ago with keflex. Symptoms did not improve. Had negative cervical ctx at that time, but no urine culture in record. Still having some odor and frequency. States her urine smells "loud." Denies fevers, back pain, n/v, abdominal pain.   2. Vaginal discharge. This is recurrent. Treated for BV with flagyl. Symptoms improved but have now returned. Notices odor and white discharge. Denies pelvic pain, itching, irritation, rash, fevers. Patient refused pelvic exam insisted on doing self-wet prep today.   Review of Systems See HPI otherwise negative.     Objective:   Physical Exam  Vitals reviewed. Constitutional: She is oriented to person, place, and time. She appears well-developed and well-nourished. No distress.  HENT:  Head: Normocephalic and atraumatic.  Cardiovascular: Normal rate, regular rhythm, normal heart sounds and intact distal pulses.   No murmur heard. Pulmonary/Chest: Effort normal.  Abdominal: Soft. Bowel sounds are normal. She exhibits no distension. There is no tenderness. There is no rebound and no guarding.       No flank pain or costovertebral tenderness.  Neurological: She is alert and oriented to person, place, and time.  Psychiatric: She has a normal mood and affect.      Assessment & Plan:

## 2011-03-10 NOTE — Assessment & Plan Note (Signed)
UA indicates possibility of UTI. Possible that organism is keflex resistant, unable to know for sure without culture. Have made sure this specimen was sent to lab for culture today. If no growth, will question whether this may be related to alternative causes such as interstitial cystitis or metabolic (frequency and odor?). Will treat with 5 days bactrim as patient has tolerated this medication previously. Given rx for topical antifungal in case of post-abx yeast.

## 2011-03-12 LAB — URINE CULTURE: Colony Count: 75000

## 2011-05-12 ENCOUNTER — Other Ambulatory Visit (HOSPITAL_COMMUNITY)
Admission: RE | Admit: 2011-05-12 | Discharge: 2011-05-12 | Disposition: A | Discharge status: Home / Self Care | Payer: Self-pay | Source: Ambulatory Visit | Attending: Family Medicine | Admitting: Family Medicine

## 2011-05-12 ENCOUNTER — Encounter: Payer: Self-pay | Admitting: Family Medicine

## 2011-05-12 ENCOUNTER — Ambulatory Visit (INDEPENDENT_AMBULATORY_CARE_PROVIDER_SITE_OTHER): Payer: Self-pay | Admitting: Family Medicine

## 2011-05-12 VITALS — BP 111/77 | HR 87 | Temp 98.0°F | Ht 67.0 in | Wt 145.2 lb

## 2011-05-12 DIAGNOSIS — L509 Urticaria, unspecified: Secondary | ICD-10-CM | POA: Insufficient documentation

## 2011-05-12 DIAGNOSIS — Z113 Encounter for screening for infections with a predominantly sexual mode of transmission: Secondary | ICD-10-CM | POA: Insufficient documentation

## 2011-05-12 DIAGNOSIS — N760 Acute vaginitis: Secondary | ICD-10-CM

## 2011-05-12 DIAGNOSIS — N898 Other specified noninflammatory disorders of vagina: Secondary | ICD-10-CM

## 2011-05-12 LAB — POCT WET PREP (WET MOUNT)

## 2011-05-12 MED ORDER — TRIAMCINOLONE ACETONIDE 0.1 % EX OINT: TOPICAL_OINTMENT | Freq: Two times a day (BID) | CUTANEOUS | Status: DC

## 2011-05-12 MED ORDER — LORATADINE 10 MG PO TABS: 10.0000 mg | ORAL_TABLET | Freq: Every day | ORAL | Status: DC

## 2011-05-12 MED ORDER — FLUCONAZOLE 150 MG PO TABS: 150.0000 mg | ORAL_TABLET | Freq: Once | ORAL | Status: AC

## 2011-05-12 MED ORDER — METRONIDAZOLE 500 MG PO TABS: 500.0000 mg | ORAL_TABLET | Freq: Two times a day (BID) | ORAL | Status: AC

## 2011-05-12 NOTE — Assessment & Plan Note (Signed)
Bacterial vaginosis.  Will test for STDs  Will also Rx for yeast infection per patient request. "i always get a yeast infection with any antibiotic."

## 2011-05-12 NOTE — Progress Notes (Signed)
  Subjective:    Patient ID: [name], female    DOB: 1980-03-10, 31 y.o.   MRN: [id number]  HPI Cell [phone]  Patient with vag discharge.  No concerns re: pregnancy.  Has mostly monagamous partner but she did have intercourse outside the relationship last month.  Wants STD check.  No Pain or fever  Also pruritic rash on arm    Review of Systems     Objective:   Physical Exam Moderate vag discharge.  Cervix visually OK No uterine tenderness. Urticarial rash on arm       Assessment & Plan:

## 2011-05-12 NOTE — Assessment & Plan Note (Signed)
Educated and meds given

## 2011-05-12 NOTE — Patient Instructions (Signed)
I sent two prescriptions, a pill and an ointment for the rash. I will call with the results of the test

## 2011-05-18 ENCOUNTER — Encounter: Payer: Self-pay | Admitting: Family Medicine

## 2011-07-01 ENCOUNTER — Encounter (HOSPITAL_COMMUNITY): Payer: Self-pay

## 2011-07-01 ENCOUNTER — Observation Stay (HOSPITAL_COMMUNITY)
Admission: EM | Admit: 2011-07-01 | Discharge: 2011-07-03 | Disposition: A | Discharge status: Home / Self Care | Payer: Medicaid Other | Attending: Family Medicine | Admitting: Family Medicine

## 2011-07-01 DIAGNOSIS — E1065 Type 1 diabetes mellitus with hyperglycemia: Secondary | ICD-10-CM

## 2011-07-01 DIAGNOSIS — Z794 Long term (current) use of insulin: Secondary | ICD-10-CM | POA: Insufficient documentation

## 2011-07-01 DIAGNOSIS — E109 Type 1 diabetes mellitus without complications: Secondary | ICD-10-CM | POA: Insufficient documentation

## 2011-07-01 DIAGNOSIS — E103599 Type 1 diabetes mellitus with proliferative diabetic retinopathy without macular edema, unspecified eye: Secondary | ICD-10-CM | POA: Diagnosis present

## 2011-07-01 DIAGNOSIS — A084 Viral intestinal infection, unspecified: Secondary | ICD-10-CM | POA: Diagnosis present

## 2011-07-01 DIAGNOSIS — E781 Pure hyperglyceridemia: Secondary | ICD-10-CM

## 2011-07-01 DIAGNOSIS — I10 Essential (primary) hypertension: Secondary | ICD-10-CM | POA: Insufficient documentation

## 2011-07-01 DIAGNOSIS — R072 Precordial pain: Secondary | ICD-10-CM

## 2011-07-01 DIAGNOSIS — K3184 Gastroparesis: Secondary | ICD-10-CM

## 2011-07-01 DIAGNOSIS — E860 Dehydration: Principal | ICD-10-CM | POA: Diagnosis present

## 2011-07-01 DIAGNOSIS — R112 Nausea with vomiting, unspecified: Secondary | ICD-10-CM

## 2011-07-01 DIAGNOSIS — E785 Hyperlipidemia, unspecified: Secondary | ICD-10-CM | POA: Insufficient documentation

## 2011-07-01 DIAGNOSIS — IMO0002 Reserved for concepts with insufficient information to code with codable children: Secondary | ICD-10-CM

## 2011-07-01 DIAGNOSIS — F17200 Nicotine dependence, unspecified, uncomplicated: Secondary | ICD-10-CM | POA: Insufficient documentation

## 2011-07-01 DIAGNOSIS — R1110 Vomiting, unspecified: Secondary | ICD-10-CM | POA: Diagnosis present

## 2011-07-01 DIAGNOSIS — R739 Hyperglycemia, unspecified: Secondary | ICD-10-CM | POA: Diagnosis present

## 2011-07-01 DIAGNOSIS — K5289 Other specified noninfective gastroenteritis and colitis: Secondary | ICD-10-CM | POA: Insufficient documentation

## 2011-07-01 LAB — URINE MICROSCOPIC-ADD ON

## 2011-07-01 LAB — GLUCOSE, CAPILLARY: Glucose-Capillary: 290 mg/dL — ABNORMAL HIGH (ref 70–99)

## 2011-07-01 LAB — LIPASE, BLOOD: Lipase: 12 U/L (ref 11–59)

## 2011-07-01 LAB — COMPREHENSIVE METABOLIC PANEL
ALT: 35 U/L (ref 0–35)
Albumin: 5.1 g/dL (ref 3.5–5.2)
Alkaline Phosphatase: 64 U/L (ref 39–117)
BUN: 12 mg/dL (ref 6–23)
Calcium: 10.8 mg/dL — ABNORMAL HIGH (ref 8.4–10.5)
GFR calc Af Amer: >90 mL/min (ref >90)
Glucose, Bld: 326 mg/dL — ABNORMAL HIGH (ref 70–99)
Potassium: 4.7 mEq/L (ref 3.5–5.1)
Sodium: 139 mEq/L (ref 135–145)
Total Protein: 9.1 g/dL — ABNORMAL HIGH (ref 6.0–8.3)

## 2011-07-01 LAB — POCT PREGNANCY, URINE: Preg Test, Ur: NEGATIVE

## 2011-07-01 LAB — CBC
MCH: 30.4 pg (ref 26.0–34.0)
MCHC: 33.8 g/dL (ref 30.0–36.0)
MCV: 90 fL (ref 78.0–100.0)
Platelets: 273 10*3/uL (ref 150–400)

## 2011-07-01 LAB — POCT I-STAT 3, VENOUS BLOOD GAS (G3P V)
O2 Saturation: 88 %
pCO2, Ven: 41.3 mmHg — ABNORMAL LOW (ref 45.0–50.0)
pH, Ven: 7.362 — ABNORMAL HIGH (ref 7.250–7.300)
pO2, Ven: 57 mmHg — ABNORMAL HIGH (ref 30.0–45.0)

## 2011-07-01 LAB — URINALYSIS, ROUTINE W REFLEX MICROSCOPIC
Bilirubin Urine: NEGATIVE
Nitrite: NEGATIVE
Specific Gravity, Urine: 1.029 (ref 1.005–1.030)
Urobilinogen, UA: 0.2 mg/dL (ref 0.0–1.0)
pH: 6.5 (ref 5.0–8.0)

## 2011-07-01 LAB — DIFFERENTIAL
Basophils Relative: 0 % (ref 0–1)
Eosinophils Absolute: 0 10*3/uL (ref 0.0–0.7)
Eosinophils Relative: 0 % (ref 0–5)
Neutrophils Relative %: 93 % — ABNORMAL HIGH (ref 43–77)

## 2011-07-01 MED ORDER — SODIUM CHLORIDE 0.9 % IV SOLN
1000.0000 mL | Freq: Once | INTRAVENOUS | Status: AC
  Administered 2011-07-01: 1000 mL via INTRAVENOUS

## 2011-07-01 MED ORDER — INSULIN ASPART PROT & ASPART (70-30 MIX) 100 UNIT/ML ~~LOC~~ SUSP
15.0000 [IU] | Freq: Two times a day (BID) | SUBCUTANEOUS | Status: DC
  Administered 2011-07-02 (×2): 25 [IU] via SUBCUTANEOUS
  Filled 2011-07-01 (×2): qty 3

## 2011-07-01 MED ORDER — PROMETHAZINE HCL 25 MG/ML IJ SOLN
25.0000 mg | Freq: Four times a day (QID) | INTRAMUSCULAR | Status: DC | PRN
  Administered 2011-07-02 (×2): 25 mg via INTRAVENOUS
  Filled 2011-07-01 (×2): qty 1

## 2011-07-01 MED ORDER — METOCLOPRAMIDE HCL 5 MG/ML IJ SOLN
10.0000 mg | Freq: Once | INTRAMUSCULAR | Status: AC
  Administered 2011-07-01: 10 mg via INTRAVENOUS
  Filled 2011-07-01: qty 2

## 2011-07-01 MED ORDER — INSULIN ASPART 100 UNIT/ML ~~LOC~~ SOLN
10.0000 [IU] | Freq: Once | SUBCUTANEOUS | Status: AC
  Administered 2011-07-01: 10 [IU] via INTRAVENOUS
  Filled 2011-07-01: qty 1

## 2011-07-01 MED ORDER — ONDANSETRON HCL 4 MG PO TABS: 4.0000 mg | ORAL_TABLET | Freq: Four times a day (QID) | ORAL | Status: DC | PRN

## 2011-07-01 MED ORDER — ZOLPIDEM TARTRATE 5 MG PO TABS: 5.0000 mg | ORAL_TABLET | Freq: Every evening | ORAL | Status: DC | PRN

## 2011-07-01 MED ORDER — MORPHINE SULFATE 4 MG/ML IJ SOLN
4.0000 mg | Freq: Once | INTRAMUSCULAR | Status: AC
  Administered 2011-07-01: 4 mg via INTRAVENOUS
  Filled 2011-07-01: qty 1

## 2011-07-01 MED ORDER — SODIUM CHLORIDE 0.9 % IV BOLUS (SEPSIS)
1000.0000 mL | Freq: Once | INTRAVENOUS | Status: AC
  Administered 2011-07-01: 1000 mL via INTRAVENOUS

## 2011-07-01 MED ORDER — HEPARIN SODIUM (PORCINE) 5000 UNIT/ML IJ SOLN
5000.0000 [IU] | Freq: Three times a day (TID) | INTRAMUSCULAR | Status: DC
  Administered 2011-07-02 – 2011-07-03 (×2): 5000 [IU] via SUBCUTANEOUS
  Filled 2011-07-01 (×7): qty 1

## 2011-07-01 MED ORDER — SODIUM CHLORIDE 0.9 % IV SOLN
INTRAVENOUS | Status: DC
  Administered 2011-07-02: via INTRAVENOUS

## 2011-07-01 MED ORDER — ACETAMINOPHEN 325 MG PO TABS
650.0000 mg | ORAL_TABLET | Freq: Four times a day (QID) | ORAL | Status: DC | PRN
  Administered 2011-07-02 – 2011-07-03 (×3): 650 mg via ORAL
  Filled 2011-07-01 (×3): qty 2

## 2011-07-01 MED ORDER — ONDANSETRON HCL 4 MG/2ML IJ SOLN
4.0000 mg | Freq: Once | INTRAMUSCULAR | Status: AC
  Administered 2011-07-01: 4 mg via INTRAVENOUS
  Filled 2011-07-01: qty 2

## 2011-07-01 MED ORDER — INSULIN ASPART 100 UNIT/ML ~~LOC~~ SOLN
0.0000 [IU] | Freq: Three times a day (TID) | SUBCUTANEOUS | Status: DC
  Administered 2011-07-02: 3 [IU] via SUBCUTANEOUS
  Administered 2011-07-02: 2 [IU] via SUBCUTANEOUS

## 2011-07-01 MED ORDER — METOCLOPRAMIDE HCL 5 MG/ML IJ SOLN
10.0000 mg | Freq: Three times a day (TID) | INTRAMUSCULAR | Status: DC | PRN
  Filled 2011-07-01: qty 2

## 2011-07-01 MED ORDER — ONDANSETRON HCL 4 MG/2ML IJ SOLN: 4.0000 mg | Freq: Four times a day (QID) | INTRAMUSCULAR | Status: DC | PRN

## 2011-07-01 MED ORDER — ACETAMINOPHEN 650 MG RE SUPP: 650.0000 mg | Freq: Four times a day (QID) | RECTAL | Status: DC | PRN

## 2011-07-01 NOTE — ED Provider Notes (Addendum)
History     CSN: [id number]  Arrival date & time 07/01/11  1723   First MD Initiated Contact with Patient 07/01/11 1949      Chief Complaint  Patient presents with  . Emesis    (Consider location/radiation/quality/duration/timing/severity/associated sxs/prior treatment) Patient is a 31 y.o. female presenting with vomiting. The history is provided by the patient.  Emesis  This is a new problem. The current episode started 6 to 12 hours ago. The problem occurs more than 10 times per day. The problem has not changed since onset.The emesis has an appearance of stomach contents and bilious material. There has been no fever. Associated symptoms include abdominal pain. Pertinent negatives include no chills, no cough, no diarrhea, no fever and no URI. Risk factors: Blood sugar has been running high.    Past Medical History  Diagnosis Date  . Hyperlipidemia   . Hypertension   . Diabetes mellitus     A1c > 11. Patient does not check BG or take insulin regularly.  Dx at age 39.  . Tobacco abuse   . Incarceration     Was in jail for selling drugs - out Oct 2010.    Past Surgical History  Procedure Date  . Tubal ligation 2009    No family history on file.  History  Substance Use Topics  . Smoking status: Current Everyday Smoker -- 0.3 packs/day    Types: Cigarettes  . Smokeless tobacco: Never Used   Comment: Quit 09/21/2010 while hospitalilzed/restarted again.   . Alcohol Use: 0.5 oz/week    1 drink(s) per week     Occasional.    OB History    Grav Para Term Preterm Abortions TAB SAB Ect Mult Living                  Review of Systems  Constitutional: Negative for fever and chills.  Respiratory: Negative for cough.   Gastrointestinal: Positive for vomiting and abdominal pain. Negative for diarrhea.  Genitourinary: Negative for dysuria, urgency, frequency, vaginal bleeding and vaginal discharge.  All other systems reviewed and are negative.    Allergies  Review of  patient's allergies indicates no known allergies.  Home Medications   Current Outpatient Rx  Name Route Sig Dispense Refill  . INSULIN ASPART PROT & ASPART (70-30) 100 UNIT/ML [place] SUSP Subcutaneous Inject 15-25 Units into the skin 2 (two) times daily with a meal. Use 25 units in AM and 15 units in PM      BP 152/83  Pulse 86  Temp(Src) 98.7 F (37.1 C) (Oral)  Resp 17  SpO2 100%  Physical Exam  Nursing note and vitals reviewed. Constitutional: She is oriented to person, place, and time. She appears well-developed and well-nourished. No distress.  HENT:  Head: Normocephalic and atraumatic.  Mouth/Throat: Oropharynx is clear and moist. Mucous membranes are dry.  Eyes: Conjunctivae and EOM are normal. Pupils are equal, round, and reactive to light.  Neck: Normal range of motion. Neck supple.  Cardiovascular: Normal rate, regular rhythm and intact distal pulses.   No murmur heard. Pulmonary/Chest: Effort normal and breath sounds normal. No respiratory distress. She has no wheezes. She has no rales.  Abdominal: Soft. Normal appearance. She exhibits no distension. There is generalized tenderness. There is no rebound and no guarding.  Musculoskeletal: Normal range of motion. She exhibits no edema and no tenderness.  Neurological: She is alert and oriented to person, place, and time.  Skin: Skin is warm and dry. No rash noted.  No erythema.  Psychiatric: She has a normal mood and affect. Her behavior is normal.    ED Course  Procedures (including critical care time)  Labs Reviewed  GLUCOSE, CAPILLARY - Abnormal; Notable for the following:    Glucose-Capillary 290 (*)    All other components within normal limits  CBC - Abnormal; Notable for the following:    WBC 16.6 (*)    All other components within normal limits  DIFFERENTIAL - Abnormal; Notable for the following:    Neutrophils Relative 93 (*)    Neutro Abs 15.5 (*)    Lymphocytes Relative 5 (*)    Monocytes Relative 2 (*)      All other components within normal limits  COMPREHENSIVE METABOLIC PANEL - Abnormal; Notable for the following:    Glucose, Bld 326 (*)    Calcium 10.8 (*)    Total Protein 9.1 (*)    AST 47 (*) HEMOLYSIS AT THIS LEVEL MAY AFFECT RESULT   All other components within normal limits  URINALYSIS, ROUTINE W REFLEX MICROSCOPIC - Abnormal; Notable for the following:    Glucose, UA >1000 (*)    Ketones, ur 40 (*)    All other components within normal limits  POCT I-STAT 3, BLOOD GAS (G3P V) - Abnormal; Notable for the following:    pH, Ven 7.362 (*)    pCO2, Ven 41.3 (*)    pO2, Ven 57.0 (*)    All other components within normal limits  LIPASE, BLOOD  POCT PREGNANCY, URINE  URINE MICROSCOPIC-ADD ON   No results found.   1. Gastroparesis   2. Hyperglycemia       MDM   Patient with history of diabetes he is now taking 7030 insulin because her insurance ran out and she is no longer able to afford the Lantus and NovoLog. Her sugars have been running higher but today she started vomiting dark emesis and she's had abdominal pain. The vomiting is gone on all day long and she's been unable to hold anything down. On exam she is moderately dehydrated with diffuse abdominal tenderness. She denies any dysuria or vaginal symptoms at this time. Concern for possible DKA versus diabetic gastroparesis versus infection. CBC with a leukocytosis of 16,000 from an unknown source and she has a normal urine and no respiratory symptoms. VBG is negative for any signs of DKA at this time and her lipase is within normal limits. Will continue to hydrate and treat hyperglycemia. Patient's nausea is improved after Zofran but still having some nausea and pain on exam. She was given Reglan and will speak with her physicians for admission.       Gwyneth Sprout, MD 07/01/11 0272  Gwyneth Sprout, MD 07/01/11 2201

## 2011-07-01 NOTE — H&P (Signed)
Family Medicine Teaching [facility] Admission History and Physical  Patient name: AFRAH BURLISON Medical record number: [id number] Date of birth: September 16, 1980 Age: 31 y.o. Gender: female  Primary Care Provider: Clementeen Graham, MD, MD  Chief Complaint: Nausea/vomiting and hyperglycemia  Assessment and Plan: ALLIYAH ROESLER is a 31 y.o. year old female presenting with nausea,vomiting and hyperglycemia. 1. Nausea/vomiting: The patient reports a disease chronology that is most compatible with a viral gastroenteritis. She is currently not able to keep any fluids down and is significantly dehydrated (greater than 10%). I will place her on status, hydrate her with an additional 1 L of normal saline followed by normal saline running at 150 cc per hour, encourage oral intake as tolerated, and give her Zofran and Phenergan as needed. The patient is not demonstrating any evidence of a urinary tract infection and does not report any fevers that would suggest other infection. Physical exam does not suggest any obvious other source. As the patient's urinalysis does have some white cells in it I will send this for culture. Since she is asymptomatic at this time I will not start antibiotics. 2. Diabetes: The patient is mildly hyperglycemic but non-acidotic. Per her report her blood sugars have been running on the high side for the last several months and, our last documented A1c would agree with this. Accordingly I am not particularly interested in tight glucose control at this time. I will restart her home regimen and give her a sensitive sliding scale. Once she resumes eating we may be able to adjust her insulin level somewhat, however most of this will need to be deferred to an outpatient setting. 3. Leukocytosis: Patient is a mild leukocytosis without obvious evidence of bacterial infection. It is likely that this is more reactive than anything else, however I will investigate further if the patient develops  other signs of systemic infection while in the hospital. 4. History of hyperlipidemia: Patient has not had a fasting lipid panel in some time. We'll take this opportunity to obtain one in the morning. 5. FEN/GI: Advance diet as tolerated, normal saline at 1.5 times maintenance 6. Prophylaxis: Subcutaneous heparin 7. Disposition: To a MedSurg bed, anticipate discharge once patient able to tolerate by mouth  History of Present Illness: PIEPER KASIK is a 31 y.o. year old female presenting with nausea, vomiting, dehydration, and hyperglycemia.  Patient is a 31 year old with known type 1 diabetes reports that she was in her normal state of health until this morning. This morning she began to get an upset stomach and began vomiting. She reports greater than 20 episodes of emesis and being unable to keep any solids or fluids down. She also noted that her blood sugars today have been running significantly higher than normal for her. She reports some body aches but no fevers, some nonbloody diarrhea and but no dysuria. She further denies any shortness of breath, chest pain, visual changes, difficulty swallowing, or rashes. She has intermittently had a mildly painful abdomen. This pain is described as a more cramping sensation that comes and goes.  Of note, the patient recently lost her Medicaid has had to be transitioned off of Lantus to 7030. This transition is not on particularly smoothly per her report her blood sugars have been relatively poorly controlled since then. She reports many blood sugars in the 2 to 300s. Her blood sugars have mainly been in the 3 to 400s today.  In the emergency department the patient was given 1 L of normal saline along  with Reglan, morphine, and Zofran. When these failed to relieve her symptoms the decision was made to admit her for hydration and glucose control.  Patient Active Problem List  Diagnoses  . Diabetes mellitus type 1, uncontrolled, insulin dependent  .  HYPERCHOLESTEROLEMIA  . TOBACCO USER  . TRIGGER FINGER, RIGHT THUMB  . Nausea & vomiting  . Hyperglycemia  . Dehydration  . Emesis   Past Medical History: Past Medical History  Diagnosis Date  . Hyperlipidemia   . Hypertension   . Diabetes mellitus     A1c > 11. Patient does not check BG or take insulin regularly.  Dx at age 18.  . Tobacco abuse   . Incarceration     Was in jail for selling drugs - out Oct 2010.    Past Surgical History: Past Surgical History  Procedure Date  . Tubal ligation 2009    Social History: History   Social History  . Marital Status: Single    Spouse Name: N/A    Number of Children: N/A  . Years of Education: N/A   Social History Main Topics  . Smoking status: Current Everyday Smoker -- 0.3 packs/day    Types: Cigarettes  . Smokeless tobacco: Never Used   Comment: Quit 09/21/2010 while hospitalilzed/restarted again.   . Alcohol Use: 0.5 oz/week    1 drink(s) per week     Occasional.  . Drug Use: No  . Sexually Active: Yes   Other Topics Concern  . None   Social History Narrative  . None    Family History: No family history on file.  Allergies: No Known Allergies  Current Facility-Administered Medications  Medication Dose Route Frequency Provider Last Rate Last Dose  . insulin aspart (novoLOG) injection 10 Units  10 Units Intravenous Once Gwyneth Sprout, MD   10 Units at 07/01/11 2149  . metoCLOPramide (REGLAN) injection 10 mg  10 mg Intravenous Once Gwyneth Sprout, MD   10 mg at 07/01/11 2158  . morphine 4 MG/ML injection 4 mg  4 mg Intravenous Once Gwyneth Sprout, MD   4 mg at 07/01/11 2054  . ondansetron (ZOFRAN) injection 4 mg  4 mg Intravenous Once Gwyneth Sprout, MD   4 mg at 07/01/11 2054  . sodium chloride 0.9 % bolus 1,000 mL  1,000 mL Intravenous Once Gwyneth Sprout, MD   1,000 mL at 07/01/11 2052  . sodium chloride 0.9 % bolus 1,000 mL  1,000 mL Intravenous Once Gwyneth Sprout, MD   1,000 mL at 07/01/11  2149   Current Outpatient Prescriptions  Medication Sig Dispense Refill  . insulin aspart protamine-insulin aspart (NOVOLOG 70/30) (70-30) 100 UNIT/ML injection Inject 15-25 Units into the skin 2 (two) times daily with a meal. Use 25 units in AM and 15 units in PM       Review Of Systems: Per HPI. 12 point review of systems was performed and is as noted above.  Physical Exam: BP 152/83  Pulse 86  Temp(Src) 98.7 F (37.1 C) (Oral)  Resp 17  SpO2 100% General: Unwell-appearing female not in any acute distress HEENT: Mucous membranes are somewhat dry, extraocular movements are intact, pupils round reactive, no JVD. Cardiovascular: Regular rate and rhythm, 2/6 systolic murmur best heard at the left sternal border. Respiratory: Clear to auscultation bilaterally. No crackles wheezes or rales. Abdomen: Moderately increased bowel sounds in all quadrants. Abdomen is soft, nontender, with no guarding or rebound. Extremities: 2+ pulses, full range of motion, no edema, significantly greater than 2  second capillary refill  Labs and Imaging:  Results for orders placed during the hospital encounter of 07/01/11 (from the past 24 hour(s))  GLUCOSE, CAPILLARY     Status: Abnormal   Collection Time   07/01/11  5:37 PM      Component Value Range   Glucose-Capillary 290 (*) 70 - 99 (mg/dL)  CBC     Status: Abnormal   Collection Time   07/01/11  8:45 PM      Component Value Range   WBC 16.6 (*) 4.0 - 10.5 (K/uL)   RBC 4.28  3.87 - 5.11 (MIL/uL)   Hemoglobin 13.0  12.0 - 15.0 (g/dL)   HCT 09.8  11.9 - 14.7 (%)   MCV 90.0  78.0 - 100.0 (fL)   MCH 30.4  26.0 - 34.0 (pg)   MCHC 33.8  30.0 - 36.0 (g/dL)   RDW 82.9  56.2 - 13.0 (%)   Platelets 273  150 - 400 (K/uL)  DIFFERENTIAL     Status: Abnormal   Collection Time   07/01/11  8:45 PM      Component Value Range   Neutrophils Relative 93 (*) 43 - 77 (%)   Neutro Abs 15.5 (*) 1.7 - 7.7 (K/uL)   Lymphocytes Relative 5 (*) 12 - 46 (%)   Lymphs Abs 0.8   0.7 - 4.0 (K/uL)   Monocytes Relative 2 (*) 3 - 12 (%)   Monocytes Absolute 0.3  0.1 - 1.0 (K/uL)   Eosinophils Relative 0  0 - 5 (%)   Eosinophils Absolute 0.0  0.0 - 0.7 (K/uL)   Basophils Relative 0  0 - 1 (%)   Basophils Absolute 0.0  0.0 - 0.1 (K/uL)  COMPREHENSIVE METABOLIC PANEL     Status: Abnormal   Collection Time   07/01/11  8:45 PM      Component Value Range   Sodium 139  135 - 145 (mEq/L)   Potassium 4.7  3.5 - 5.1 (mEq/L)   Chloride 98  96 - 112 (mEq/L)   CO2 21  19 - 32 (mEq/L)   Glucose, Bld 326 (*) 70 - 99 (mg/dL)   BUN 12  6 - 23 (mg/dL)   Creatinine, Ser 8.65  0.50 - 1.10 (mg/dL)   Calcium 78.4 (*) 8.4 - 10.5 (mg/dL)   Total Protein 9.1 (*) 6.0 - 8.3 (g/dL)   Albumin 5.1  3.5 - 5.2 (g/dL)   AST 47 (*) 0 - 37 (U/L)   ALT 35  0 - 35 (U/L)   Alkaline Phosphatase 64  39 - 117 (U/L)   Total Bilirubin 0.8  0.3 - 1.2 (mg/dL)   GFR calc non Af Amer >90  >90 (mL/min)   GFR calc Af Amer >90  >90 (mL/min)  LIPASE, BLOOD     Status: Normal   Collection Time   07/01/11  8:45 PM      Component Value Range   Lipase 12  11 - 59 (U/L)  URINALYSIS, ROUTINE W REFLEX MICROSCOPIC     Status: Abnormal   Collection Time   07/01/11  9:15 PM      Component Value Range   Color, Urine YELLOW  YELLOW    APPearance CLEAR  CLEAR    Specific Gravity, Urine 1.029  1.005 - 1.030    pH 6.5  5.0 - 8.0    Glucose, UA >1000 (*) NEGATIVE (mg/dL)   Hgb urine dipstick NEGATIVE  NEGATIVE    Bilirubin Urine NEGATIVE  NEGATIVE  Ketones, ur 40 (*) NEGATIVE (mg/dL)   Protein, ur NEGATIVE  NEGATIVE (mg/dL)   Urobilinogen, UA 0.2  0.0 - 1.0 (mg/dL)   Nitrite NEGATIVE  NEGATIVE    Leukocytes, UA NEGATIVE  NEGATIVE   POCT I-STAT 3, BLOOD GAS (G3P V)     Status: Abnormal   Collection Time   07/01/11  9:15 PM      Component Value Range   pH, Ven 7.362 (*) 7.250 - 7.300    pCO2, Ven 41.3 (*) 45.0 - 50.0 (mmHg)   pO2, Ven 57.0 (*) 30.0 - 45.0 (mmHg)   Bicarbonate 23.4  20.0 - 24.0 (mEq/L)   TCO2 25   0 - 100 (mmol/L)   O2 Saturation 88.0     Acid-base deficit 2.0  0.0 - 2.0 (mmol/L)   Patient temperature 37.0 C     Sample type VENOUS    URINE MICROSCOPIC-ADD ON     Status: Normal   Collection Time   07/01/11  9:15 PM      Component Value Range   Squamous Epithelial / LPF RARE  RARE    WBC, UA 3-6  <3 (WBC/hpf)   RBC / HPF 0-2  <3 (RBC/hpf)   Bacteria, UA RARE  RARE   POCT PREGNANCY, URINE     Status: Normal   Collection Time   07/01/11  9:19 PM      Component Value Range   Preg Test, Ur NEGATIVE  NEGATIVE    Jakorey Mcconathy 07/01/2011, 10:47 PM [phone]

## 2011-07-01 NOTE — ED Notes (Signed)
Complains of vomiting all day and sts that she is diabetic

## 2011-07-01 NOTE — ED Notes (Signed)
Called and gave report to Vickie.

## 2011-07-02 LAB — LIPID PANEL
Cholesterol: 154 mg/dL (ref 0–200)
HDL: 63 mg/dL (ref >39)
LDL Cholesterol: 85 mg/dL (ref 0–99)
LDL Cholesterol: 93 mg/dL (ref 0–99)
Total CHOL/HDL Ratio: 2.6 RATIO
Triglycerides: 46 mg/dL (ref <150)

## 2011-07-02 LAB — CBC
HCT: 31.8 % — ABNORMAL LOW (ref 36.0–46.0)
MCV: 91.1 fL (ref 78.0–100.0)
RBC: 3.49 MIL/uL — ABNORMAL LOW (ref 3.87–5.11)
RDW: 14.9 % (ref 11.5–15.5)
WBC: 17.2 10*3/uL — ABNORMAL HIGH (ref 4.0–10.5)

## 2011-07-02 LAB — COMPREHENSIVE METABOLIC PANEL
ALT: 21 U/L (ref 0–35)
AST: 25 U/L (ref 0–37)
Albumin: 3.5 g/dL (ref 3.5–5.2)
Calcium: 8.6 mg/dL (ref 8.4–10.5)
Sodium: 138 mEq/L (ref 135–145)
Total Protein: 6.7 g/dL (ref 6.0–8.3)

## 2011-07-02 LAB — HEMOGLOBIN A1C: Mean Plasma Glucose: 235 mg/dL — ABNORMAL HIGH (ref <117)

## 2011-07-02 LAB — MAGNESIUM: Magnesium: 1.7 mg/dL (ref 1.5–2.5)

## 2011-07-02 LAB — GLUCOSE, CAPILLARY
Glucose-Capillary: 61 mg/dL — ABNORMAL LOW (ref 70–99)
Glucose-Capillary: 90 mg/dL (ref 70–99)

## 2011-07-02 MED ORDER — SODIUM CHLORIDE 0.9 % IV SOLN
INTRAVENOUS | Status: DC
  Administered 2011-07-02: 15:00:00 via INTRAVENOUS

## 2011-07-02 MED ORDER — DEXTROSE 5 % IV SOLN
1.0000 g | INTRAVENOUS | Status: DC
  Administered 2011-07-03: 1 g via INTRAVENOUS
  Filled 2011-07-02 (×2): qty 10

## 2011-07-02 MED ORDER — FLUCONAZOLE 150 MG PO TABS
150.0000 mg | ORAL_TABLET | Freq: Every day | ORAL | Status: DC
  Administered 2011-07-03: 150 mg via ORAL
  Filled 2011-07-02 (×2): qty 1

## 2011-07-02 NOTE — Progress Notes (Signed)
Interim note: Nurse called with t of 101. [name] notes irritation in her mouth c/w thrush. She denies any SOB, cough, abdominal pain or diarrhea. She notes a headache but is well otherwise.   Exam:  BP 146/82  Pulse 97  Temp(Src) 101.3 F (38.5 C) (Oral)  Resp 18  Ht 5\' 6"  (1.676 m)  Wt 150 lb 9.6 oz (68.312 kg)  BMI 24.31 kg/m2  SpO2 100% Gen: Well NAD HEENT: EOMI,  MMM, white plaques notes in mouth.  Lungs: CTABL Nl WOB Heart: RRR no MRG Abd: NABS, NT, ND Exts: Non edematous BL  LE, warm and well perfused.   A/P: Think oral thrush. Plan to treat with magic mouthwash and fluconazole.   Fever may be UTI as pt has a long history of frequent UTIs. Plan to obtain urine and blood samples for UA, CBC and culture and start ceftriaxone and f/u in the morning.

## 2011-07-02 NOTE — Progress Notes (Signed)
Subjective: [name] was admitted for nausea and vomiting. In the interim with IV fluid rehydration and antiemetics she feels well. She has not vomited this morning and is interested in eating her breakfast.  She is able to drink fluids and is urinating.  She denies any abdominal pain diarrhea fevers or chills.    Objective: Vital signs in last 24 hours: Temp:  [98.3 F (36.8 C)-99 F (37.2 C)] 99 F (37.2 C) (06/02 0444) Pulse Rate:  [86-96] 96  (06/02 0444) Resp:  [17-18] 18  (06/02 0444) BP: (100-152)/(59-83) 109/59 mmHg (06/02 0444) SpO2:  [99 %-100 %] 99 % (06/02 0444) Weight:  [142 lb 3.2 oz (64.501 kg)] 142 lb 3.2 oz (64.501 kg) (06/01 2326) Weight change:  Last BM Date: 07/01/11  Intake/Output from previous day: 06/01 0701 - 06/02 0700 In: 737.5 [P.O.:360; I.V.:377.5] Out: 1 [Urine:1] Intake/Output this shift:    Exam:  BP 109/59  Pulse 96  Temp(Src) 99 F (37.2 C) (Oral)  Resp 18  Ht 5\' 6"  (1.676 m)  Wt 142 lb 3.2 oz (64.501 kg)  BMI 22.95 kg/m2  SpO2 99% Gen: Well NAD HEENT: EOMI,  MMM Lungs: CTABL Nl WOB Heart: RRR no MRG Abd: NABS, NT, ND Exts: Non edematous BL  LE, warm and well perfused.    Lab Results:  [facility] 07/02/11 0629 07/01/11 2045  WBC 17.2* 16.6*  HGB 10.7* 13.0  HCT 31.8* 38.5  PLT 259 273   BMET  Basename 07/02/11 0629 07/01/11 2045  NA 138 139  K 3.9 4.7  CL 105 98  CO2 20 21  GLUCOSE 239* 326*  BUN 10 12  CREATININE 0.70 0.69  CALCIUM 8.6 10.8*    Studies/Results: No results found.  Medications:  I have reviewed the patient's current medications. Scheduled:   . sodium chloride  1,000 mL Intravenous Once  . heparin  5,000 Units Subcutaneous Q8H  . insulin aspart  0-9 Units Subcutaneous TID WC  . insulin aspart  10 Units Intravenous Once  . insulin aspart protamine-insulin aspart  15-25 Units Subcutaneous BID WC  . metoCLOPramide (REGLAN) injection  10 mg Intravenous Once  .  morphine injection  4 mg Intravenous  Once  . ondansetron  4 mg Intravenous Once  . sodium chloride  1,000 mL Intravenous Once  . sodium chloride  1,000 mL Intravenous Once   Continuous:   . DISCONTD: sodium chloride 150 mL/hr at 07/02/11 0029   WUJ:[id number], acetaminophen, metoCLOPramide (REGLAN) injection, ondansetron (ZOFRAN) IV, ondansetron, promethazine, zolpidem  Assessment/Plan: [name] is a 31 y.o. year old female presenting with nausea,vomiting and hyperglycemia.  1. Nausea/vomiting: The patient reports a disease chronology that is most compatible with a viral gastroenteritis.  Differential also includes gastroparesis as she is a long history of poorly controlled diabetes. However she feels well with IV fluid rehydration and antibiotics. Plan to discontinue fluids and encourage ad lib. oral fluid and food.  Follow closely today perhaps discharged this evening or tomorrow if doing well.  2. Diabetes: Long history of poor control largely do to nonadherence to medication regimen. Currently on 70/30 insulin due to cost.  Plan to continue 7030 insulin with close followup as an outpatient. Tight control is important this time than reinitiating medication adherence. 3. Leukocytosis: Patient is a mild leukocytosis without obvious evidence of bacterial infection. It is likely that this is more reactive than anything else, however I will investigate further if the patient develops other signs of systemic infection while in the  hospital. 4. History of hyperlipidemia: Patient has not had a fasting lipid panel in some time. We'll take this opportunity to obtain one in the morning. 5. FEN/GI: As noted above [name] is no longer vomiting and interested in eating. We'll saline lock the IV and follow diet today.   6. Prophylaxis: Subcutaneous heparin 7. Disposition: Possibly home today if doing well.  LOS: 1 day   [name] 07/02/2011, 8:43 AM

## 2011-07-02 NOTE — H&P (Signed)
Seen and examined.  Chart reviewed.  Discussed with Drs. Ritch and Denyse Amass.  Agree with their management.  Briefly, 31 yo Type 1 DM admitted with N&V.  Recently lost Medicaid so DM control less with her intermitant taking of insulin 70/30.  Has hx of poor compliance and 15 years of poorly controled DM.  Thus far, long term sequela have been minimal, but we all worry that she is a set up for this.  N&V improved, this am. My history was limited because she was upset that I woke her ("I already talked to Dr. Denyse Amass.  Why do I also have to talk to you.")    While I would love to use this hospitalization as an intervention to make her care about her long term control, I doubt that will happen.  N&V improving: etiology? Viral GE versus early diabetic gastroparesis.  Regardless, DC when tolerating PO and keep working on control and compliance as an outpatient.

## 2011-07-02 NOTE — Progress Notes (Signed)
Pt arrived to the floor via stretcher accompanied by ED staff. Transferred to the bed and admission assessment and history completed. Bed lowered to lowest position, wheels locked. Pt has no complaints of pain or shortness of breath. Will continue to assess pt periodically.

## 2011-07-02 NOTE — Progress Notes (Signed)
Seen and examined.  My note is contained in the co-sign of the H&PE

## 2011-07-03 DIAGNOSIS — A084 Viral intestinal infection, unspecified: Secondary | ICD-10-CM | POA: Diagnosis present

## 2011-07-03 LAB — GLUCOSE, CAPILLARY
Glucose-Capillary: 101 mg/dL — ABNORMAL HIGH (ref 70–99)
Glucose-Capillary: 80 mg/dL (ref 70–99)

## 2011-07-03 LAB — DIFFERENTIAL
Basophils Relative: 0 % (ref 0–1)
Eosinophils Relative: 0 % (ref 0–5)
Monocytes Absolute: 1 10*3/uL (ref 0.1–1.0)
Monocytes Relative: 7 % (ref 3–12)
Neutro Abs: 10.5 10*3/uL — ABNORMAL HIGH (ref 1.7–7.7)

## 2011-07-03 LAB — URINALYSIS, ROUTINE W REFLEX MICROSCOPIC
Bilirubin Urine: NEGATIVE
Hgb urine dipstick: NEGATIVE
Ketones, ur: NEGATIVE mg/dL
Specific Gravity, Urine: 1.009 (ref 1.005–1.030)
Urobilinogen, UA: 0.2 mg/dL (ref 0.0–1.0)

## 2011-07-03 LAB — CBC
HCT: 35 % — ABNORMAL LOW (ref 36.0–46.0)
Hemoglobin: 11.7 g/dL — ABNORMAL LOW (ref 12.0–15.0)
MCHC: 33.4 g/dL (ref 30.0–36.0)

## 2011-07-03 LAB — BASIC METABOLIC PANEL
BUN: 6 mg/dL (ref 6–23)
Chloride: 105 mEq/L (ref 96–112)
GFR calc Af Amer: >90 mL/min (ref >90)
Glucose, Bld: 67 mg/dL — ABNORMAL LOW (ref 70–99)
Potassium: 3.4 mEq/L — ABNORMAL LOW (ref 3.5–5.1)

## 2011-07-03 LAB — URINE CULTURE: Culture  Setup Time: 201306021129

## 2011-07-03 MED ORDER — PROMETHAZINE HCL 12.5 MG PO TABS: 12.5000 mg | ORAL_TABLET | Freq: Four times a day (QID) | ORAL | Status: DC | PRN

## 2011-07-03 MED ORDER — FLUCONAZOLE 150 MG PO TABS: 150.0000 mg | ORAL_TABLET | Freq: Once | ORAL | Status: DC

## 2011-07-03 MED ORDER — FLUCONAZOLE 150 MG PO TABS: 150.0000 mg | ORAL_TABLET | Freq: Every day | ORAL | Status: AC

## 2011-07-03 MED ORDER — INSULIN ASPART PROT & ASPART (70-30 MIX) 100 UNIT/ML ~~LOC~~ SUSP: 15.0000 [IU] | Freq: Two times a day (BID) | SUBCUTANEOUS | Status: DC

## 2011-07-03 MED ORDER — IBUPROFEN 400 MG PO TABS
400.0000 mg | ORAL_TABLET | Freq: Once | ORAL | Status: AC
  Administered 2011-07-03: 400 mg via ORAL
  Filled 2011-07-03 (×2): qty 1

## 2011-07-03 MED ORDER — MAGIC MOUTHWASH
5.0000 mL | Freq: Two times a day (BID) | ORAL | Status: DC
  Administered 2011-07-03: 5 mL via ORAL
  Filled 2011-07-03 (×3): qty 5

## 2011-07-03 MED ORDER — WHITE PETROLATUM GEL
Status: AC
  Filled 2011-07-03: qty 5

## 2011-07-03 MED ORDER — MAGIC MOUTHWASH: 5.0000 mL | Freq: Two times a day (BID) | ORAL | Status: DC

## 2011-07-03 MED ORDER — INSULIN ASPART PROT & ASPART (70-30 MIX) 100 UNIT/ML ~~LOC~~ SUSP: 15.0000 [IU] | Freq: Every day | SUBCUTANEOUS | Status: DC

## 2011-07-03 MED ORDER — INSULIN ASPART PROT & ASPART (70-30 MIX) 100 UNIT/ML ~~LOC~~ SUSP
25.0000 [IU] | Freq: Every day | SUBCUTANEOUS | Status: DC
  Administered 2011-07-03: 25 [IU] via SUBCUTANEOUS
  Filled 2011-07-03: qty 3

## 2011-07-03 NOTE — Progress Notes (Signed)
UR complete and referred to MD advisor for review, due to Observation status.  Johny Shock RN MPH Case Manager [phone]

## 2011-07-03 NOTE — Clinical Social Work Psychosocial (Signed)
     Clinical Social Work Department BRIEF PSYCHOSOCIAL ASSESSMENT 07/03/2011  Patient:  [name], [name]     Account Number:  [id number]     Admit date:  07/01/2011  Clinical Social Worker:  Lourdes Sledge  Date/Time:  07/03/2011 03:15 AM  Referred by:  Physician  Date Referred:  07/03/2011 Referred for  Other - See comment   Other Referral:   Medicaid questions   Interview type:  Patient Other interview type:    PSYCHOSOCIAL DATA Living Status:  FAMILY Admitted from facility:   Level of care:   Primary support name:  n/a Primary support relationship to patient:   Degree of support available:   Pt did not disclose any support system. Pt was very short with CSW during her assessment.    CURRENT CONCERNS Current Concerns  Other - See comment   Other Concerns:   Pt wanted assistance applying for Medicaid.    SOCIAL WORK ASSESSMENT / PLAN CSW received a referral that pt Medicaid terminated 2 months ago. Pt stated she received her Medicaid through her children however was then dropped because she maid too much money. Pt stated she ended up losing her job and therefore is wondering if she is able to receive Medicaid again.    CSW informed pt that she does not work for DSS and therefore is not familiar with the exact requirements. CSW asked if pt has returned to provide DSS with updated information pertaining to her job loss however pt stated she had not. CSW informed pt that since she is dc'ing today that the financial counselor would most likely mail the pt a Medicaid application. CSW informed the pt that Medicaid has specific requirements and that our financial counselor would determine whether pt would qualify based on her health. CSW obtained pt contact information and informed the pt that CSW would provide this information to the financial counselor. Pt agreeable.   Assessment/plan status:  No Further Intervention Required Other assessment/ plan:   Information/referral  to community resources:    PATIENTS/FAMILYS RESPONSE TO PLAN OF CARE: Pt alert and oriented. Pt appeared frustrated that CSW/or financial counseling could not immediately do a Medicaid application with pt. CSW explained the process and that a financial counselor would follow up with pt at dc. Pt agreeable. No furthr CSW needs addressed.

## 2011-07-03 NOTE — Discharge Summary (Signed)
I discussed with Dr Hunter.  I agree with their plans documented in their discharge note for today. 

## 2011-07-03 NOTE — Discharge Summary (Signed)
Physician Discharge Summary  Patient ID: [name] MRN: [id number] DOB: [birth year] Age: 31 y.o.  Admit date: 07/01/2011 Discharge date: 07/03/2011  PCP: Clementeen Graham, MD, MD   Discharge Diagnosis: Principal Problem:  *Dehydration Active Problems:  Diabetes mellitus type 1, uncontrolled, insulin dependent  Hyperglycemia  Emesis  Viral gastroenteritis    Hospital Course [name] is a 31 y.o. year old female presenting with nausea,vomiting due to viral gastroenteritis with associated hyperglycemia.  1. Nausea/vomiting: diarrhea/vomiting/nausea at time of admission. Given time course, thought due to viral gastroenteritis. Due to poorly controlled DM, DDx included gastroparesis. Was febrile to 101.3 during admission which subsided on its own and did not reoccur. Fevers consistent with gastroenteritis as no signs of alternate infectious process Patient weaned off of IVF and she began to tolerate both PO liquids and solids. Mainly soft foods. Sore throat developed from vomiting. Patient also with signs of thrush so given 5 days of diflucan and magic moutwash. PO intake improved on this regimen. Patient tolerated 4 meals before discharge without vomiting. Used phenergan primarily and discharged home on this.  2. Fever-once again no other signs of infection (no wounds, asymptomatic from UTI perspective, no cold, congestion, no SOB or cough) outside gastroenteritis. Given 1x dose of CTX due to history of UTI but patient asymptomatic and UA unremarkable so stopped CTX after one dose. Leukocytosis-consistent with viral infection as no obvious bacterial infection at this time.  3. Diabetes Type 1 with hyperglycemia to 300s at admission: Type I Long history of poor control largely do to nonadherence to medication regimen. a1c 9.8 currently.  1. Currently on 70/30 insulin due to cost. Plan to continue 7030 insulin with close followup as an outpatient.  2. Did have a few lows into mid 60s so  discontinued SSI.  4. History of hyperlipidemia: LDL 93 at goal of <100.  5. Poor access to care-SW contacted as patient without insurance. Patient to receive application for Medicaid by mail and is to mail back to hospital financial counselor.   Procedures/Imaging:  No results found.  Labs  CBC  Lab 07/02/11 2240 07/02/11 0629 07/01/11 2045  WBC 14.6* 17.2* 16.6*  HGB 11.7* 10.7* 13.0  HCT 35.0* 31.8* 38.5  PLT 262 259 273   BMET  Lab 07/02/11 2240 07/02/11 0629 07/01/11 2045  NA 140 138 139  K 3.4* 3.9 4.7  CL 105 105 98  CO2 25 20 21   BUN 6 10 12   CREATININE 0.75 0.70 0.69  CALCIUM 9.0 8.6 10.8*  PROT -- 6.7 9.1*  BILITOT -- 0.8 0.8  ALKPHOS -- 49 64  ALT -- 21 35  AST -- 25 47*  GLUCOSE 67* 239* 326*   LIPASE, BLOOD     Status: Normal   Collection Time   07/01/11  8:45 PM      Component Value Range Comment   Lipase 12  11 - 59 (U/L)   URINE CULTURE     Status: Normal   Collection Time   07/01/11  9:15 PM      Component Value Range Comment   Specimen Description URINE, RANDOM      Special Requests NONE      Culture  Setup Time [id number]      Colony Count >=100,000 COLONIES/ML      Culture        Value: Multiple bacterial morphotypes present, none predominant. Suggest appropriate recollection if clinically indicated.   Report Status 07/03/2011 FINAL     POCT PREGNANCY,  URINE     Status: Normal   Collection Time   07/01/11  9:19 PM      Component Value Range Comment   Preg Test, Ur NEGATIVE  NEGATIVE    HEMOGLOBIN A1C     Status: Abnormal   Collection Time   07/02/11  6:29 AM      Component Value Range Comment   Hemoglobin A1C 9.8 (*) <5.7 (%)    Mean Plasma Glucose 235 (*) <117 (mg/dL)   LIPID PANEL     Status: Normal   Collection Time   07/02/11  6:29 AM      Component Value Range Comment   Cholesterol 154  0 - 200 (mg/dL)    Triglycerides 46  <540 (mg/dL)    HDL 60  >98 (mg/dL)    Total CHOL/HDL Ratio 2.6      VLDL 9  0 - 40 (mg/dL)    LDL Cholesterol  85  0 - 99 (mg/dL)   URINALYSIS, ROUTINE W REFLEX MICROSCOPIC     Status: Normal   Collection Time   07/03/11 12:32 AM      Component Value Range Comment   Color, Urine YELLOW  YELLOW     APPearance CLEAR  CLEAR     Specific Gravity, Urine 1.009  1.005 - 1.030     pH 7.5  5.0 - 8.0     Glucose, UA NEGATIVE  NEGATIVE (mg/dL)    Hgb urine dipstick NEGATIVE  NEGATIVE     Bilirubin Urine NEGATIVE  NEGATIVE     Ketones, ur NEGATIVE  NEGATIVE (mg/dL)    Protein, ur NEGATIVE  NEGATIVE (mg/dL)    Urobilinogen, UA 0.2  0.0 - 1.0 (mg/dL)    Nitrite NEGATIVE  NEGATIVE     Leukocytes, UA NEGATIVE  NEGATIVE  MICROSCOPIC NOT DONE ON URINES WITH NEGATIVE PROTEIN, BLOOD, LEUKOCYTES, NITRITE, OR GLUCOSE <1000 mg/dL.       Patient condition at time of discharge/disposition: stable  Disposition-home   Follow up issues: 1. Follow up glucose control on Novolog 70/30 with 25 units AM/15 units PM 2. Follow up throat pain and thrush.  3. Follow up nausea/vomiting.   Discharge follow up:  Follow-up Information    Follow up with Clementeen Graham, MD on 07/12/2011. (2:45 PM)    Contact information:   [address] [place] [address] [phone]         Discharge Orders    Future Appointments: Provider: Department: Dept Phone: Center:   07/12/2011 2:45 PM Rodolph Bong, MD [facility] [phone] [facility]     Future Orders Please Complete By Expires   Diet Carb Modified      Increase activity slowly      Call MD for:  persistant nausea and vomiting      Discharge instructions      Comments:   Instead of getting Magic Mouthwash which I have prescribed, you can try chloraseptic spray that you can buy over the counter.       [place], Denley  Home Medication Instructions NGE:[id number]   Printed on:07/03/11 1531  Medication Information                    Alum & Mag Hydroxide-Simeth (MAGIC MOUTHWASH) SOLN Take 5 mLs by mouth 2 (two) times daily.           insulin aspart  protamine-insulin aspart (NOVOLOG 70/30) (70-30) 100 UNIT/ML injection Inject 15-25 Units into the skin 2 (two) times  daily with a meal. Use 25 units in AM and 15 units in PM           fluconazole (DIFLUCAN) 150 MG tablet Take 1 tablet (150 mg total) by mouth at bedtime.           promethazine (PHENERGAN) 12.5 MG tablet Take 1 tablet (12.5 mg total) by mouth every 6 (six) hours as needed for nausea.              Tana Conch, MD of [name] 07/03/2011 3:18 PM

## 2011-07-03 NOTE — Progress Notes (Signed)
I discussed with Dr Hunter.  I agree with their plans documented in their progress note for today.  

## 2011-07-03 NOTE — Progress Notes (Signed)
Clinical Child psychotherapist (CSW) aware of referral. CSW contacted financial counseling and informed that [name] the financial counselor will see self pay pt to assist with completing a Medicaid application. A psychosocial assessment to follow.  Theresia Bough, MSW, Theresia Majors  [phone]

## 2011-07-03 NOTE — Progress Notes (Signed)
Patient ID: VANESHA ATHENS, female   DOB: 25-Jun-1980, 31 y.o.   MRN: [id number]  PGY-1 Daily Progress Note Family Medicine Teaching Service Aldine Contes. Marti Sleigh, MD Service Pager: [phone]   Subjective: Very upset this morning as she said nurse came into her room "talking all loud and waking her up". This in fact was intercom attempting to reach the nurse. Also upset that I am not Dr. Denyse Amass and demanding that I be the only doctor to see her today as she doesn't like to see more than 1 doctor. Informed patient that we are a teaching program so this is not likely.   Concerning appetite-has been able to tolerate lunch and dinner last night and willing to try breakfast today. Last emesis yesterday morning after breakfast. She claims she does not have a stomach bug but that this is what always happens when her sugars run high. Says her throat is irritated with the thrush she talked about last night with Dr. Denyse Amass  Denies SOB, wounds, dysuria, polyuria, cough, congestion, diarrhea currently.   Objective:  Temp:  [98.1 F (36.7 C)-101.3 F (38.5 C)] 99.3 F (37.4 C) (06/03 0449) Pulse Rate:  [75-97] 75  (06/03 0449) Resp:  [17-18] 18  (06/03 0449) BP: (116-146)/(67-82) 140/78 mmHg (06/03 0449) SpO2:  [100 %] 100 % (06/03 0449) Weight:  [150 lb 9.6 oz (68.312 kg)] 150 lb 9.6 oz (68.312 kg) (06/02 2047)  Intake/Output Summary (Last 24 hours) at 07/03/11 0913 Last data filed at 07/03/11 0449  Gross per 24 hour  Intake      0 ml  Output   1300 ml  Net  -1300 ml    Gen:  NAD,  HEENT: moist mucous membranes. Whitish coating on tongue but not on palate.  CV: Regular rate and rhythm, no murmurs rubs or gallops PULM: clear to auscultation bilaterally. No wheezes/rales/rhonchi ABD: soft/nontender/nondistended/normal bowel sounds EXT: No edema,  warm and well perfused Neuro: Alert and oriented x3, moves all extremities,  Labs and imaging:   CBC  Lab 07/02/11 2240 07/02/11 0629 07/01/11  2045  WBC 14.6* 17.2* 16.6*  HGB 11.7* 10.7* 13.0  HCT 35.0* 31.8* 38.5  PLT 262 259 273   BMET/CMET  Lab 07/02/11 2240 07/02/11 0629 07/01/11 2045  NA 140 138 139  K 3.4* 3.9 4.7  CL 105 105 98  CO2 25 20 21   BUN 6 10 12   CREATININE 0.75 0.70 0.69  CALCIUM 9.0 8.6 10.8*  PROT -- 6.7 9.1*  BILITOT -- 0.8 0.8  ALKPHOS -- 49 64  ALT -- 21 35  AST -- 25 47*  GLUCOSE 67* 239* 326*   URINALYSIS, ROUTINE W REFLEX MICROSCOPIC     Status: Normal   Collection Time   07/03/11 12:32 AM      Component Value Range   Color, Urine YELLOW  YELLOW    APPearance CLEAR  CLEAR    Specific Gravity, Urine 1.009  1.005 - 1.030    pH 7.5  5.0 - 8.0    Glucose, UA NEGATIVE  NEGATIVE (mg/dL)   Hgb urine dipstick NEGATIVE  NEGATIVE    Bilirubin Urine NEGATIVE  NEGATIVE    Ketones, ur NEGATIVE  NEGATIVE (mg/dL)   Protein, ur NEGATIVE  NEGATIVE (mg/dL)   Urobilinogen, UA 0.2  0.0 - 1.0 (mg/dL)   Nitrite NEGATIVE  NEGATIVE    Leukocytes, UA NEGATIVE  NEGATIVE    CBG (last 3)   Basename 07/03/11 0748 07/03/11 0004 07/02/11 2104  GLUCAP  80 65* 90   Imaging: No results found.   Assessment  SURABHI GADEA is a 31 y.o. year old female presenting with nausea,vomiting and hyperglycemia.   Plan:  1. Nausea/vomiting: agree with previous diagnosis of viral gastroenteritis given diarrhea at presentation, nausea, vomiting. Vomiting has persisted while diarrhea subsided. DDx also included gastroparesis. Patient says this is similar to previous times sugar has been high. Educated patient that infection could cause sugars to be elevated. Fever 10pm last night and afebrile could go along with gastroenteritis.   1. SLIV, encourage PO 2. Tolerated 2 meals yesterday, if tolerates breakfast and lunch, likely discharge home if still afebrile 3. Phenergan used twice prn yesterday. No zofran or reglan.  2. Fever-once again no other signs of infection. Will stop CTX as not symptomatic for UTI and UA unremarkable.  Urine culture pending.  1. Leukocytosis-consistent with viral infection as no obvious bacterial infection at this time. No AM CBC at this time.  3. Diabetes: Type I Long history of poor control largely do to nonadherence to medication regimen. a1c 9.8 currently.   1. Currently on 70/30 insulin due to cost.  Plan to continue 7030 insulin with close followup as an outpatient.  2. On 25 AM, 15 Pm. Received 25 PM last night as well as 5 units SSI. Has had a few lows in the 60s so discontinued SSI and changed order to clarify 25 AM, 15 PM.  4. History of hyperlipidemia: LDL 93 at goal of <100.  5. FEN/GI: carb modified diet. Tolerating at present.    6. Prophylaxis: Subcutaneous heparin 7. Disposition: home today if tolerating meals and afebrile   LOS: 2 days  Tana Conch, MD PGY1, Family Medicine Teaching Service [phone]

## 2011-07-03 NOTE — Progress Notes (Signed)
Clinical Child psychotherapist (CSW) visited pt room, pt laying in bed asleep. CSW called pt name, pt awoke and asked for CSW to return another time.  Theresia Bough, MSW, Theresia [facility]ors [phone]

## 2011-07-07 ENCOUNTER — Telehealth: Payer: Self-pay | Admitting: Family Medicine

## 2011-07-07 NOTE — Telephone Encounter (Signed)
Patient has a hfu appt next week.  A nurse from the hospital just called her to check on her and she said that her throat is still pretty sore.  The hospital nurse said to call her PCP to make him aware.

## 2011-07-07 NOTE — Telephone Encounter (Signed)
Ok will f/u in clinic

## 2011-07-07 NOTE — Telephone Encounter (Signed)
Fwd. To Dr.Corey for info. [name], [name]

## 2011-07-09 LAB — URINE CULTURE
Colony Count: >=100000
Culture  Setup Time: 201306031013

## 2011-07-09 LAB — CULTURE, BLOOD (ROUTINE X 2)
Culture  Setup Time: 201306031036
Culture  Setup Time: 201306031036
Culture: NO GROWTH

## 2011-07-10 ENCOUNTER — Telehealth: Payer: Self-pay | Admitting: Family Medicine

## 2011-07-10 NOTE — Telephone Encounter (Signed)
Called about results of urine culture. Unable to contact pt or leave a message. Will f/u in clinic or sooner if having complaints.

## 2011-07-12 ENCOUNTER — Inpatient Hospital Stay: Payer: Self-pay | Admitting: Family Medicine

## 2011-08-07 ENCOUNTER — Ambulatory Visit (INDEPENDENT_AMBULATORY_CARE_PROVIDER_SITE_OTHER): Payer: Self-pay | Admitting: Family Medicine

## 2011-08-07 ENCOUNTER — Encounter: Payer: Self-pay | Admitting: Family Medicine

## 2011-08-07 ENCOUNTER — Telehealth: Payer: Self-pay | Admitting: *Deleted

## 2011-08-07 VITALS — BP 127/79 | HR 84 | Ht 66.0 in | Wt 151.7 lb

## 2011-08-07 DIAGNOSIS — R300 Dysuria: Secondary | ICD-10-CM

## 2011-08-07 DIAGNOSIS — E1065 Type 1 diabetes mellitus with hyperglycemia: Secondary | ICD-10-CM

## 2011-08-07 DIAGNOSIS — N760 Acute vaginitis: Secondary | ICD-10-CM | POA: Insufficient documentation

## 2011-08-07 DIAGNOSIS — Z111 Encounter for screening for respiratory tuberculosis: Secondary | ICD-10-CM

## 2011-08-07 DIAGNOSIS — IMO0002 Reserved for concepts with insufficient information to code with codable children: Secondary | ICD-10-CM

## 2011-08-07 LAB — POCT URINALYSIS DIPSTICK
Ketones, UA: NEGATIVE
Spec Grav, UA: 1.015

## 2011-08-07 MED ORDER — INSULIN ASPART PROT & ASPART (70-30 MIX) 100 UNIT/ML ~~LOC~~ SUSP: 15.0000 [IU] | Freq: Two times a day (BID) | SUBCUTANEOUS | Status: DC

## 2011-08-07 MED ORDER — "INSULIN SYRINGE 30G X 1/2"" 1 ML MISC": 1.0000 | Status: DC | PRN

## 2011-08-07 MED ORDER — GLUCOSE BLOOD VI STRP: ORAL_STRIP | Status: DC

## 2011-08-07 NOTE — Assessment & Plan Note (Signed)
Likely yeast infection. Will wait for final results of wet prep. Also obtained UA. Leuk esterase present but suspect present given vaginal discharge. Sent urine to culture. No overt symptoms of UTI, mainly vaginitis.

## 2011-08-07 NOTE — Patient Instructions (Signed)
For your diabetes, you can increase your dose of 70/30 in the morning to 35 units. Keep taking 25 units at night. If you can, I'd like for you to check your sugar at least once a day.   I will call you with the results of the prep.   I will also let you know of the results for the urine sample.   Please come back in 1 month  to follow up on your diabetes.

## 2011-08-07 NOTE — Assessment & Plan Note (Signed)
Increased NPH to 35u in the morning from 30u. COntinue 25u at night. Patient instructed to check glucose at least once daily. Sent Rx for strips and syringes.

## 2011-08-07 NOTE — Progress Notes (Signed)
Patient ID: [name], female   DOB: [birth year], 31 y.o.   MRN: [id number] Patient ID: [name]    DOB: Dec 24, 1980, 31 y.o.   MRN: [id number] --- Subjective:  [name] is a 31 y.o.female who presents for med refill and complaint of vaginal discharge.  Vaginal discharge: X1.5 weeks. Brown/white d/c. Odor with urination. No vaginal itching or burning. Tried over the counter medicine for yeast infection which did not help. Sexually active with one partner. Partner without known risk for STD's.  No dysuria. Polyuria chronic with diabetes. No back pain. No abdominal pain. No fever. No nausea/vomiting.  DM: Not checking glucose because ran out of strips. Hasn't checked since discharge from hospital in June. Having some symptoms of elevated glucose with polyuria and polydypsia. Also sometimes has symptoms of low glucose with weakness and shakiness.  On 70/30: 30 u in am and 25u in pm.   ROS: see HPI Past Medical History: reviewed and updated medications and allergies. Social History: Tobacco: 1/3 pack per day  Objective: Filed Vitals:   08/07/11 1044  BP: 127/79  Pulse: 84    Physical Examination:   General appearance - alert, well appearing, and in no distress Neck - supple, no significant adenopathy Chest - clear to auscultation, no wheezes, rales or rhonchi, symmetric air entry Heart - normal rate, regular rhythm, normal S1, S2, no murmurs, rubs, clicks or gallops Abdomen - soft, nontender, nondistended, no masses or organomegaly, negative CVA tenderness Extremities - peripheral pulses normal, no pedal edema, no clubbing or cyanosis Pelvic exam: normal external genitalia, vagina and cervix, whitish vaginal discharge

## 2011-08-08 ENCOUNTER — Telehealth: Payer: Self-pay | Admitting: Family Medicine

## 2011-08-08 LAB — WET PREP, GENITAL
WBC, Wet Prep HPF POC: NONE SEEN
Yeast Wet Prep HPF POC: NONE SEEN

## 2011-08-08 MED ORDER — METRONIDAZOLE 500 MG PO TABS: 500.0000 mg | ORAL_TABLET | Freq: Two times a day (BID) | ORAL | Status: AC

## 2011-08-08 MED ORDER — FLUCONAZOLE 150 MG PO TABS: 150.0000 mg | ORAL_TABLET | Freq: Once | ORAL | Status: AC

## 2011-08-08 NOTE — Telephone Encounter (Signed)
Called patient about BV results. Will send flagyl to pharmacy. Patient also reports getting yeast infections after antibiotics and requested for Rx for yeast infection in case this occurs. Sending for fluconazole 150mg  once

## 2011-08-09 ENCOUNTER — Telehealth: Payer: Self-pay | Admitting: Family Medicine

## 2011-08-09 MED ORDER — CEPHALEXIN 500 MG PO CAPS: 500.0000 mg | ORAL_CAPSULE | Freq: Three times a day (TID) | ORAL | Status: AC

## 2011-08-09 NOTE — Telephone Encounter (Signed)
Called patient with results of positive ecoli UTI. Will treat with Keflex 500mg  tid x10days.  If patient continues having recurrent infections, she may benefit from prophylactic therapy.  Patient agreed to this.

## 2011-08-10 ENCOUNTER — Ambulatory Visit: Payer: Self-pay | Admitting: *Deleted

## 2011-08-10 DIAGNOSIS — IMO0001 Reserved for inherently not codable concepts without codable children: Secondary | ICD-10-CM

## 2011-08-10 LAB — URINE CULTURE

## 2011-08-10 LAB — TB SKIN TEST: TB Skin Test: NEGATIVE

## 2011-08-18 NOTE — Telephone Encounter (Signed)
error 

## 2011-09-29 ENCOUNTER — Other Ambulatory Visit: Payer: Self-pay | Admitting: *Deleted

## 2011-09-29 DIAGNOSIS — E109 Type 1 diabetes mellitus without complications: Secondary | ICD-10-CM

## 2011-09-29 NOTE — Telephone Encounter (Signed)
Pharmacy requesting refill.

## 2011-10-03 MED ORDER — INSULIN PEN NEEDLE 31G X 5 MM MISC: 1.0000 | Freq: Two times a day (BID) | Status: DC

## 2011-10-09 ENCOUNTER — Ambulatory Visit: Payer: Medicaid Other | Admitting: Family Medicine

## 2011-10-17 ENCOUNTER — Ambulatory Visit: Payer: Medicaid Other | Admitting: Family Medicine

## 2011-10-18 ENCOUNTER — Encounter: Payer: Self-pay | Admitting: Family Medicine

## 2011-10-18 ENCOUNTER — Ambulatory Visit (INDEPENDENT_AMBULATORY_CARE_PROVIDER_SITE_OTHER): Payer: Medicaid Other | Admitting: Family Medicine

## 2011-10-18 ENCOUNTER — Other Ambulatory Visit (HOSPITAL_COMMUNITY)
Admission: RE | Admit: 2011-10-18 | Discharge: 2011-10-18 | Disposition: A | Discharge status: Home / Self Care | Payer: Medicaid Other | Source: Ambulatory Visit | Attending: Family Medicine | Admitting: Family Medicine

## 2011-10-18 VITALS — BP 132/86 | HR 81 | Temp 98.2°F | Ht 66.0 in | Wt 152.0 lb

## 2011-10-18 DIAGNOSIS — N760 Acute vaginitis: Secondary | ICD-10-CM

## 2011-10-18 DIAGNOSIS — B9689 Other specified bacterial agents as the cause of diseases classified elsewhere: Secondary | ICD-10-CM | POA: Insufficient documentation

## 2011-10-18 DIAGNOSIS — M545 Low back pain, unspecified: Secondary | ICD-10-CM

## 2011-10-18 DIAGNOSIS — Z113 Encounter for screening for infections with a predominantly sexual mode of transmission: Secondary | ICD-10-CM | POA: Insufficient documentation

## 2011-10-18 DIAGNOSIS — A499 Bacterial infection, unspecified: Secondary | ICD-10-CM

## 2011-10-18 LAB — POCT URINALYSIS DIPSTICK
Protein, UA: NEGATIVE
Spec Grav, UA: 1.01
Urobilinogen, UA: 0.2
pH, UA: 6

## 2011-10-18 LAB — POCT WET PREP (WET MOUNT): Clue Cells Wet Prep Whiff POC: POSITIVE

## 2011-10-18 LAB — POCT UA - MICROSCOPIC ONLY

## 2011-10-18 MED ORDER — METRONIDAZOLE 500 MG PO TABS: 500.0000 mg | ORAL_TABLET | Freq: Two times a day (BID) | ORAL | Status: DC

## 2011-10-18 NOTE — Progress Notes (Signed)
  Subjective:    Patient ID: [name], female    DOB: 1980-11-27, 31 y.o.   MRN: [id number]  HPI Here to evaluation of vaginal discharge  With odor.  Was previous dx with BV but never picked up treatment.  Stable partner.  No dysuria or urinary frequency.  Says back chronically hurts- urine sample collected.     Review of Systems See HPI    Objective:   Physical Exam GENL NAD Pelvic Exam:        External: normal female genitalia without lesions or masses        Vagina: normal without lesions or masses        Cervix: normal without lesions or masses        Samples for Wet prep, GC/Chlamydia obtained        Assessment & Plan:

## 2011-10-18 NOTE — Patient Instructions (Addendum)
Will call you at [phone] with results  Follow-up with your primary doctor

## 2011-10-18 NOTE — Assessment & Plan Note (Signed)
Metronidazole x 7 days 

## 2011-10-19 ENCOUNTER — Encounter: Payer: Self-pay | Admitting: Family Medicine

## 2011-11-16 ENCOUNTER — Other Ambulatory Visit: Payer: Self-pay | Admitting: Family Medicine

## 2011-12-12 ENCOUNTER — Ambulatory Visit: Payer: Medicaid Other

## 2011-12-12 ENCOUNTER — Telehealth: Payer: Self-pay | Admitting: Family Medicine

## 2011-12-12 NOTE — Telephone Encounter (Signed)
Patient reports back and stomach pain and urine has a strong smell. Appointment scheduled for work in today.

## 2011-12-12 NOTE — Telephone Encounter (Signed)
Pt is needing to come in today with back pain and "loud" urine

## 2011-12-13 ENCOUNTER — Inpatient Hospital Stay (HOSPITAL_COMMUNITY)
Admission: EM | Admit: 2011-12-13 | Discharge: 2011-12-20 | DRG: 690 | Disposition: A | Discharge status: Home / Self Care | Payer: Medicaid Other | Attending: Family Medicine | Admitting: Family Medicine

## 2011-12-13 ENCOUNTER — Inpatient Hospital Stay (HOSPITAL_COMMUNITY): Payer: Medicaid Other

## 2011-12-13 ENCOUNTER — Encounter (HOSPITAL_COMMUNITY): Payer: Self-pay | Admitting: *Deleted

## 2011-12-13 DIAGNOSIS — F17200 Nicotine dependence, unspecified, uncomplicated: Secondary | ICD-10-CM

## 2011-12-13 DIAGNOSIS — N12 Tubulo-interstitial nephritis, not specified as acute or chronic: Secondary | ICD-10-CM | POA: Diagnosis present

## 2011-12-13 DIAGNOSIS — N760 Acute vaginitis: Secondary | ICD-10-CM | POA: Diagnosis present

## 2011-12-13 DIAGNOSIS — R739 Hyperglycemia, unspecified: Secondary | ICD-10-CM

## 2011-12-13 DIAGNOSIS — R112 Nausea with vomiting, unspecified: Secondary | ICD-10-CM

## 2011-12-13 DIAGNOSIS — R7309 Other abnormal glucose: Secondary | ICD-10-CM

## 2011-12-13 DIAGNOSIS — B9689 Other specified bacterial agents as the cause of diseases classified elsewhere: Secondary | ICD-10-CM | POA: Diagnosis present

## 2011-12-13 DIAGNOSIS — R1115 Cyclical vomiting syndrome unrelated to migraine: Secondary | ICD-10-CM | POA: Diagnosis present

## 2011-12-13 DIAGNOSIS — N151 Renal and perinephric abscess: Secondary | ICD-10-CM

## 2011-12-13 DIAGNOSIS — R1110 Vomiting, unspecified: Secondary | ICD-10-CM

## 2011-12-13 DIAGNOSIS — A499 Bacterial infection, unspecified: Secondary | ICD-10-CM | POA: Diagnosis present

## 2011-12-13 DIAGNOSIS — E876 Hypokalemia: Secondary | ICD-10-CM | POA: Diagnosis present

## 2011-12-13 DIAGNOSIS — E103599 Type 1 diabetes mellitus with proliferative diabetic retinopathy without macular edema, unspecified eye: Secondary | ICD-10-CM | POA: Diagnosis present

## 2011-12-13 DIAGNOSIS — Z79899 Other long term (current) drug therapy: Secondary | ICD-10-CM

## 2011-12-13 DIAGNOSIS — E860 Dehydration: Secondary | ICD-10-CM | POA: Diagnosis present

## 2011-12-13 DIAGNOSIS — Z794 Long term (current) use of insulin: Secondary | ICD-10-CM

## 2011-12-13 DIAGNOSIS — A498 Other bacterial infections of unspecified site: Secondary | ICD-10-CM | POA: Diagnosis present

## 2011-12-13 DIAGNOSIS — E109 Type 1 diabetes mellitus without complications: Secondary | ICD-10-CM

## 2011-12-13 DIAGNOSIS — Z720 Tobacco use: Secondary | ICD-10-CM | POA: Diagnosis present

## 2011-12-13 DIAGNOSIS — E785 Hyperlipidemia, unspecified: Secondary | ICD-10-CM | POA: Diagnosis present

## 2011-12-13 DIAGNOSIS — A084 Viral intestinal infection, unspecified: Secondary | ICD-10-CM | POA: Diagnosis present

## 2011-12-13 DIAGNOSIS — E7800 Pure hypercholesterolemia, unspecified: Secondary | ICD-10-CM | POA: Diagnosis present

## 2011-12-13 DIAGNOSIS — I10 Essential (primary) hypertension: Secondary | ICD-10-CM | POA: Diagnosis present

## 2011-12-13 DIAGNOSIS — M6530 Trigger finger, unspecified finger: Secondary | ICD-10-CM | POA: Diagnosis present

## 2011-12-13 DIAGNOSIS — N10 Acute tubulo-interstitial nephritis: Secondary | ICD-10-CM

## 2011-12-13 DIAGNOSIS — E871 Hypo-osmolality and hyponatremia: Secondary | ICD-10-CM | POA: Diagnosis present

## 2011-12-13 DIAGNOSIS — E1065 Type 1 diabetes mellitus with hyperglycemia: Secondary | ICD-10-CM | POA: Diagnosis not present

## 2011-12-13 DIAGNOSIS — E1010 Type 1 diabetes mellitus with ketoacidosis without coma: Secondary | ICD-10-CM | POA: Diagnosis not present

## 2011-12-13 DIAGNOSIS — IMO0002 Reserved for concepts with insufficient information to code with codable children: Secondary | ICD-10-CM | POA: Diagnosis not present

## 2011-12-13 LAB — CBC WITH DIFFERENTIAL/PLATELET
Eosinophils Relative: 0 % (ref 0–5)
HCT: 38.7 % (ref 36.0–46.0)
Lymphocytes Relative: 5 % — ABNORMAL LOW (ref 12–46)
Lymphs Abs: 1 10*3/uL (ref 0.7–4.0)
MCV: 89.2 fL (ref 78.0–100.0)
Monocytes Absolute: 1.9 10*3/uL — ABNORMAL HIGH (ref 0.1–1.0)
Neutro Abs: 18.7 10*3/uL — ABNORMAL HIGH (ref 1.7–7.7)
RBC: 4.34 MIL/uL (ref 3.87–5.11)
WBC: 21.6 10*3/uL — ABNORMAL HIGH (ref 4.0–10.5)

## 2011-12-13 LAB — URINE MICROSCOPIC-ADD ON

## 2011-12-13 LAB — BASIC METABOLIC PANEL
CO2: 29 mEq/L (ref 19–32)
Calcium: 10.1 mg/dL (ref 8.4–10.5)
Chloride: 94 mEq/L — ABNORMAL LOW (ref 96–112)
Creatinine, Ser: 0.92 mg/dL (ref 0.50–1.10)
Glucose, Bld: 297 mg/dL — ABNORMAL HIGH (ref 70–99)
Sodium: 134 mEq/L — ABNORMAL LOW (ref 135–145)

## 2011-12-13 LAB — URINALYSIS, ROUTINE W REFLEX MICROSCOPIC
Glucose, UA: >1000 mg/dL — AB
Ketones, ur: 40 mg/dL — AB
Nitrite: NEGATIVE
Specific Gravity, Urine: 1.035 — ABNORMAL HIGH (ref 1.005–1.030)
pH: 5.5 (ref 5.0–8.0)

## 2011-12-13 LAB — GLUCOSE, CAPILLARY: Glucose-Capillary: 356 mg/dL — ABNORMAL HIGH (ref 70–99)

## 2011-12-13 LAB — LIPASE, BLOOD: Lipase: 9 U/L — ABNORMAL LOW (ref 11–59)

## 2011-12-13 MED ORDER — ONDANSETRON 4 MG PO TBDP
ORAL_TABLET | ORAL | Status: AC
  Filled 2011-12-13: qty 2

## 2011-12-13 MED ORDER — ALBUTEROL SULFATE (5 MG/ML) 0.5% IN NEBU: 2.5000 mg | INHALATION_SOLUTION | RESPIRATORY_TRACT | Status: DC | PRN

## 2011-12-13 MED ORDER — METRONIDAZOLE 500 MG PO TABS
500.0000 mg | ORAL_TABLET | Freq: Two times a day (BID) | ORAL | Status: DC
  Administered 2011-12-13 – 2011-12-14 (×2): 500 mg via ORAL
  Filled 2011-12-13 (×3): qty 1

## 2011-12-13 MED ORDER — ONDANSETRON HCL 4 MG/2ML IJ SOLN
4.0000 mg | Freq: Once | INTRAMUSCULAR | Status: AC
  Administered 2011-12-13: 4 mg via INTRAVENOUS
  Filled 2011-12-13: qty 2

## 2011-12-13 MED ORDER — POTASSIUM CHLORIDE IN NACL 20-0.9 MEQ/L-% IV SOLN
INTRAVENOUS | Status: AC
  Administered 2011-12-14: 11:00:00 via INTRAVENOUS
  Administered 2011-12-14: 1000 mL via INTRAVENOUS
  Filled 2011-12-13 (×4): qty 1000

## 2011-12-13 MED ORDER — INSULIN GLARGINE 100 UNIT/ML ~~LOC~~ SOLN
26.0000 [IU] | Freq: Every day | SUBCUTANEOUS | Status: DC
  Administered 2011-12-13: 23:00:00 via SUBCUTANEOUS

## 2011-12-13 MED ORDER — DEXTROSE 5 % IV SOLN
1.0000 g | INTRAVENOUS | Status: DC
  Filled 2011-12-13: qty 10

## 2011-12-13 MED ORDER — WHITE PETROLATUM GEL
Status: AC
  Filled 2011-12-13: qty 5

## 2011-12-13 MED ORDER — DEXTROSE 5 % IV SOLN
1.0000 g | Freq: Once | INTRAVENOUS | Status: DC
  Administered 2011-12-13: 1 g via INTRAVENOUS
  Filled 2011-12-13: qty 10

## 2011-12-13 MED ORDER — ONDANSETRON HCL 4 MG/2ML IJ SOLN
4.0000 mg | Freq: Four times a day (QID) | INTRAMUSCULAR | Status: DC | PRN
  Administered 2011-12-14: 4 mg via INTRAVENOUS
  Filled 2011-12-13: qty 2

## 2011-12-13 MED ORDER — ONDANSETRON 4 MG PO TBDP
8.0000 mg | ORAL_TABLET | Freq: Once | ORAL | Status: AC
  Administered 2011-12-13: 8 mg via ORAL

## 2011-12-13 MED ORDER — SENNOSIDES-DOCUSATE SODIUM 8.6-50 MG PO TABS
1.0000 | ORAL_TABLET | Freq: Every evening | ORAL | Status: DC | PRN
  Filled 2011-12-13: qty 1

## 2011-12-13 MED ORDER — ONDANSETRON HCL 4 MG PO TABS: 4.0000 mg | ORAL_TABLET | Freq: Four times a day (QID) | ORAL | Status: DC | PRN

## 2011-12-13 MED ORDER — HYDROCODONE-ACETAMINOPHEN 5-325 MG PO TABS
1.0000 | ORAL_TABLET | ORAL | Status: DC | PRN
  Administered 2011-12-13 – 2011-12-15 (×5): 2 via ORAL
  Filled 2011-12-13 (×7): qty 2

## 2011-12-13 MED ORDER — MAGIC MOUTHWASH
5.0000 mL | Freq: Two times a day (BID) | ORAL | Status: DC
  Administered 2011-12-13 – 2011-12-20 (×9): 5 mL via ORAL
  Filled 2011-12-13 (×15): qty 5

## 2011-12-13 MED ORDER — MORPHINE SULFATE 4 MG/ML IJ SOLN
4.0000 mg | Freq: Once | INTRAMUSCULAR | Status: AC
  Administered 2011-12-13: 4 mg via INTRAVENOUS
  Filled 2011-12-13: qty 1

## 2011-12-13 MED ORDER — SODIUM CHLORIDE 0.9 % IV BOLUS (SEPSIS)
1000.0000 mL | Freq: Once | INTRAVENOUS | Status: AC
  Administered 2011-12-13: 1000 mL via INTRAVENOUS

## 2011-12-13 MED ORDER — INSULIN ASPART 100 UNIT/ML ~~LOC~~ SOLN
0.0000 [IU] | Freq: Three times a day (TID) | SUBCUTANEOUS | Status: DC
  Administered 2011-12-14: 7 [IU] via SUBCUTANEOUS

## 2011-12-13 MED ORDER — DEXTROSE 5 % IV SOLN
500.0000 mg | Freq: Once | INTRAVENOUS | Status: AC
  Administered 2011-12-13: 500 mg via INTRAVENOUS
  Filled 2011-12-13: qty 500

## 2011-12-13 MED ORDER — GUAIFENESIN-DM 100-10 MG/5ML PO SYRP
5.0000 mL | ORAL_SOLUTION | ORAL | Status: DC | PRN
  Administered 2011-12-20: 5 mL via ORAL
  Filled 2011-12-13 (×2): qty 5

## 2011-12-13 MED ORDER — INSULIN ASPART PROT & ASPART (70-30 MIX) 100 UNIT/ML ~~LOC~~ SUSP
15.0000 [IU] | Freq: Two times a day (BID) | SUBCUTANEOUS | Status: DC
  Filled 2011-12-13: qty 3

## 2011-12-13 NOTE — ED Notes (Signed)
Triage done by this RN 

## 2011-12-13 NOTE — ED Notes (Signed)
Pt c/o generalized abd pain with N/V and pain into back x 2 days; pt drinking red juice prior to triage and vomiting; pt informed not to drink any more liquids

## 2011-12-13 NOTE — ED Notes (Signed)
[facility] at [facility]. Informed of wait times.

## 2011-12-13 NOTE — ED Notes (Addendum)
Pt states she did not keep her appt yesterday with PCP for abd and back pain with UTI s/s. Pt states she will give a urine when she gets an IV and pain medications.

## 2011-12-13 NOTE — ED Notes (Signed)
Patient transported to Ultrasound 

## 2011-12-13 NOTE — H&P (Addendum)
[facility]                                                                                    Patient Demographics  [name], is a 31 y.o. female  CSN: [id number]  MRN: [id number]  DOB - [birth year]  Admit Date - 12/13/2011  Outpatient Primary MD for the patient is Marena Chancy, MD   With History of -  Past Medical History  Diagnosis Date  . Hyperlipidemia   . Hypertension   . Diabetes mellitus     A1c > 11. Patient does not check BG or take insulin regularly.  Dx at age 82.  . Tobacco abuse   . Incarceration     Was in jail for selling drugs - out Oct 2010.      Past Surgical History  Procedure Date  . Tubal ligation 2009    in for   Chief Complaint  Patient presents with  . Abdominal Pain  . Back Pain  . Emesis     HPI  [name]  is a 31 y.o. female, with DM type I, recurrent UTIs and pyelonephritis, recurrent bacterial vaginosis, presents to the hospital with 2-3 day history of bilateral flank pain dysuria and increased vaginal discharge, she is also experiencing recurrent nausea vomiting, low-grade fevers, no chills, presented to the ER where she was diagnosed with pyelonephritis poorly controlled sugars and I was called to admit the patient. Patient denies unprotected sex with strangers she has one sexual partner, she does agree that she has had multiple vaginal bacterial infections along with pyelonephritis .    Review of Systems    In addition to the HPI above,   No Fever-chills, No Headache, No changes with Vision or hearing, No problems swallowing food or Liquids, No Chest pain, Cough or Shortness of Breath, No Abdominal pain, + Nausea & Vommitting, Bowel movements are regular, No Blood in stool or Urine, + dysuria, she does have some vaginal discharge with fishy odor No new skin rashes or bruises, No new joints pains-aches, except flank pain No new weakness, tingling, numbness in any extremity, No recent weight gain  or loss, No polyuria, polydypsia or polyphagia, No significant Mental Stressors.  A full 10 point Review of Systems was done, except as stated above, all other Review of Systems were negative.   Social History History  Substance Use Topics  . Smoking status: Current Every Day Smoker -- 0.3 packs/day    Types: Cigarettes  . Smokeless tobacco: Never Used     Comment: Quit 09/21/2010 while hospitalilzed/restarted again.   . Alcohol Use: 0.5 oz/week    1 drink(s) per week     Comment: Occasional.     Family History Positive for diabetes mellitus  Prior to Admission medications   Medication Sig Start Date End Date Taking? Authorizing Provider  Alum & Mag Hydroxide-Simeth (MAGIC MOUTHWASH) SOLN Take 5 mLs by mouth 2 (two) times daily. 07/03/11   Shelva Majestic, MD  glucose blood (ACCU-CHEK SMARTVIEW) test strip Check sugar 10 x daily 08/07/11 08/06/12  Lonia Skinner, MD  insulin aspart protamine-insulin aspart (NOVOLOG 70/30) (70-30) 100 UNIT/ML injection Inject 15-25  Units into the skin 2 (two) times daily with a meal. Use 25 units in AM and 15 units in PM 07/03/11   Shelva Majestic, MD  insulin aspart protamine-insulin aspart (NOVOLOG MIX 70/30) (70-30) 100 UNIT/ML injection Inject 15-25 Units into the skin 2 (two) times daily with a meal. 35 units before breakfast and 25 units before dinner 08/07/11 08/06/12  Lonia Skinner, MD  Insulin Pen Needle 31G X 5 MM MISC 1 Container by Does not apply route 2 (two) times daily. 09/29/11   Lonia Skinner, MD  Insulin Syringe-Needle U-100 (INSULIN SYRINGE 1CC/30GX1/2") 30G X 1/2" 1 ML MISC 1 Container by Does not apply route as needed. Quantity sufficient for 2 injections daily 08/07/11   Lonia Skinner, MD  LANTUS SOLOSTAR 100 UNIT/ML injection INJECT 26 UNITS INTO THE SKIN DAILY. 11/16/11   Lonia Skinner, MD  metroNIDAZOLE (FLAGYL) 500 MG tablet Take 1 tablet (500 mg total) by mouth 2 (two) times daily. 10/18/11   Macy Mis, MD    No Known  Allergies  Physical Exam  Vitals  Blood pressure 156/91, pulse 90, temperature 98.2 F (36.8 C), temperature source Oral, resp. rate 20, SpO2 99.00%.   1. General Young AA female lying in bed in a distress due to nausea  2. Normal affect and insight, Not Suicidal or Homicidal, Awake Alert, Oriented X 3.  3. No F.N deficits, ALL C.Nerves Intact, Strength 5/5 all 4 extremities, Sensation intact all 4 extremities, Plantars down going.  4. Ears and Eyes appear Normal, Conjunctivae clear, PERRLA. Moist Oral Mucosa.  5. Supple Neck, No JVD, No cervical lymphadenopathy appriciated, No Carotid Bruits.  6. Symmetrical Chest wall movement, Good air movement bilaterally, CTAB.  7. RRR, No Gallops, Rubs or Murmurs, No Parasternal Heave.  8. Positive Bowel Sounds, Abdomen Soft, Non tender, No organomegaly appriciated,No rebound -guarding or rigidity.  9.  No Cyanosis, Normal Skin Turgor, No Skin Rash or Bruise.  10. Good muscle tone,  joints appear normal , no effusions, Normal ROM.  11. No Palpable Lymph Nodes in Neck or Axillae   Data Review  CBC  Lab 12/13/11 1343  WBC 21.6*  HGB 13.2  HCT 38.7  PLT 267  MCV 89.2  MCH 30.4  MCHC 34.1  RDW 13.8  LYMPHSABS 1.0  MONOABS 1.9*  EOSABS 0.0  BASOSABS 0.0  BANDABS --   ------------------------------------------------------------------------------------------------------------------  Chemistries   Lab 12/13/11 1343  NA 134*  K 4.3  CL 94*  CO2 29  GLUCOSE 297*  BUN 11  CREATININE 0.92  CALCIUM 10.1  MG --  AST --  ALT --  ALKPHOS --  BILITOT --   ------------------------------------------------------------------------------------------------------------------ CrCl is unknown because both a height and weight (above a minimum accepted value) are required for this calculation. ------------------------------------------------------------------------------------------------------------------ No results found for this  basename: TSH,T4TOTAL,FREET3,T3FREE,THYROIDAB in the last 72 hours   Coagulation profile No results found for this basename: INR:5,PROTIME:5 in the last 168 hours ------------------------------------------------------------------------------------------------------------------- No results found for this basename: DDIMER:2 in the last 72 hours -------------------------------------------------------------------------------------------------------------------  Cardiac Enzymes No results found for this basename: CK:3,CKMB:3,TROPONINI:3,MYOGLOBIN:3 in the last 168 hours ------------------------------------------------------------------------------------------------------------------ No components found with this basename: POCBNP:3   ---------------------------------------------------------------------------------------------------------------  Urinalysis    Component Value Date/Time   COLORURINE YELLOW 12/13/2011 1656   APPEARANCEUR HAZY* 12/13/2011 1656   LABSPEC 1.035* 12/13/2011 1656   PHURINE 5.5 12/13/2011 1656   GLUCOSEU >1000* 12/13/2011 1656   HGBUR MODERATE* 12/13/2011 1656   HGBUR negative 02/24/2010 1357  BILIRUBINUR NEGATIVE 12/13/2011 1656   BILIRUBINUR NEG 10/18/2011 1504   KETONESUR 40* 12/13/2011 1656   PROTEINUR NEGATIVE 12/13/2011 1656   UROBILINOGEN 0.2 12/13/2011 1656   UROBILINOGEN 0.2 10/18/2011 1504   NITRITE NEGATIVE 12/13/2011 1656   NITRITE NEG 10/18/2011 1504   LEUKOCYTESUR SMALL* 12/13/2011 1656       Assessment & Plan   1. Flank pain nausea vomiting with dysuria secondary to reoccurrence of pyelonephritis in a patient with diabetes mellitus type 1. Plan is to admit the patient to the hospital, urine and blood cultures, reviewed recent urine culture which was Escherichia coli and it was sensitive, will place her on Rocephin, IV fluids, Zofran for nausea vomiting, will check ultrasound to rule out ureteric obstruction wasn't recurrent pallor  nephritis.     2. DM 1 in poor control.-  Recheck home dose Lantus and 70-30 insulin with meals will be continued, will add q. a.c. sliding scaleA1c.     3. History of tobacco abuse patient counseled to quit smoking.     4. Vaginal discharge with fishy odor, history of recurrent bacterial vaginosis- for now Flagyl and Rocephin will be continued, one dose Azithromycin empiric for now,  I have requested ER physician to obtain a vaginal probe and do a complete pelvic exam immediately, they will do the same,  will check an abdominal ultrasound to rule out PID, urine pregnancy test is negative. Please follow the results of vaginal probe and vaginal ultrasound.     DVT Prophylaxis  SCDs    AM Labs Ordered, also please review Full Orders  Family Communication: Admission, patients condition and plan of care including tests being ordered have been discussed with the patient and  who indicates understanding and agree with the plan and Code Status.  Code Status full  Disposition Plan: Home  Time spent in minutes : 35  Condition [name] K M.D on 12/13/2011 at 6:59 PM  Between 7am to 7pm - Pager - [phone]  After 7pm go to [email or web address] - password [email or web address]  And look for the night coverage person covering me after hours  [facility] Office  [phone]

## 2011-12-13 NOTE — ED Provider Notes (Signed)
Pt complains of continued nausea.   Vomiting during my exam.  Vitals are stable,  Lungs clear,  Heart rrr,  Abdomen soft,  Diffusely tender,   I ordered Zofran Shary Decamp, [place] 12/13/11 2013

## 2011-12-13 NOTE — ED Provider Notes (Signed)
History     CSN: [id number]  Arrival date & time 12/13/11  1302   First MD Initiated Contact with Patient 12/13/11 1830      Chief Complaint  Patient presents with  . Abdominal Pain  . Back Pain  . Emesis    (Consider location/radiation/quality/duration/timing/severity/associated sxs/prior treatment) HPI Comments: She has had 3-4 days of left flank pain and abdominal pain and yesterday she began having severe vomiting. She states this feels similar to her prior kidney infection. She also has had frequency and urgency.  Patient is a 31 y.o. female presenting with abdominal pain, back pain, and vomiting. The history is provided by the patient. No language interpreter was used.  Abdominal Pain The primary symptoms of the illness include abdominal pain, nausea, vomiting and vaginal discharge (states it is normal for her). The primary symptoms of the illness do not include fever, shortness of breath, diarrhea, hematemesis, hematochezia or dysuria. The current episode started more than 2 days ago. The onset of the illness was gradual. The problem has been gradually worsening.  The abdominal pain began more than 2 days ago. The pain came on gradually. The abdominal pain has been gradually worsening since its onset. The abdominal pain is located in the left flank. Pain radiation: diffusely. The severity of the abdominal pain is 10/10. The abdominal pain is relieved by nothing. The abdominal pain is exacerbated by vomiting.  The vomiting began yesterday. Vomiting occurs 6 to 10 times per day. The emesis contains stomach contents.  The vaginal discharge is not associated with dysuria.  The patient states that she believes she is currently not pregnant. The patient has not had a change in bowel habit. Additional symptoms associated with the illness include chills, anorexia, urgency, frequency and back pain (L flank and back). Significant associated medical issues include diabetes.  Back Pain    Associated symptoms include abdominal pain. Pertinent negatives include no chest pain, no fever, no headaches, no dysuria and no weakness.  Emesis  Associated symptoms include abdominal pain and chills. Pertinent negatives include no arthralgias, no cough, no diarrhea, no fever and no headaches.    Past Medical History  Diagnosis Date  . Hyperlipidemia   . Hypertension   . Diabetes mellitus     A1c > 11. Patient does not check BG or take insulin regularly.  Dx at age 54.  . Tobacco abuse   . Incarceration     Was in jail for selling drugs - out Oct 2010.    Past Surgical History  Procedure Date  . Tubal ligation 2009    History reviewed. No pertinent family history.  History  Substance Use Topics  . Smoking status: Current Every Day Smoker -- 0.3 packs/day    Types: Cigarettes  . Smokeless tobacco: Never Used     Comment: Quit 09/21/2010 while hospitalilzed/restarted again.   . Alcohol Use: 0.5 oz/week    1 drink(s) per week     Comment: Occasional.    OB History    Grav Para Term Preterm Abortions TAB SAB Ect Mult Living                  Review of Systems  Constitutional: Positive for chills and appetite change (decr). Negative for fever and activity change.  HENT: Negative for congestion, rhinorrhea, neck pain, neck stiffness and sinus pressure.   Eyes: Negative for discharge and visual disturbance.  Respiratory: Negative for cough, chest tightness, shortness of breath, wheezing and stridor.   Cardiovascular:  Negative for chest pain and leg swelling.  Gastrointestinal: Positive for nausea, vomiting, abdominal pain and anorexia. Negative for diarrhea, hematochezia, abdominal distention and hematemesis.  Genitourinary: Positive for urgency, frequency, flank pain and vaginal discharge (states it is normal for her). Negative for dysuria, decreased urine volume and difficulty urinating.  Musculoskeletal: Positive for back pain (L flank and back). Negative for arthralgias.   Skin: Negative for color change and pallor.  Neurological: Negative for weakness, light-headedness and headaches.  Psychiatric/Behavioral: Negative for behavioral problems and agitation.  All other systems reviewed and are negative.    Allergies  Review of patient's allergies indicates no known allergies.  Home Medications   Current Outpatient Rx  Name  Route  Sig  Dispense  Refill  . MAGIC MOUTHWASH   Oral   Take 5 mLs by mouth 2 (two) times daily.   30 mL   0   . GLUCOSE BLOOD VI STRP      Check sugar 10 x daily   300 each   3     For use with [employer] Nano meter   . INSULIN ASPART PROT & ASPART (70-30) 100 UNIT/ML [place] SUSP   Subcutaneous   Inject 15-25 Units into the skin 2 (two) times daily with a meal. Use 25 units in AM and 15 units in PM   10 mL   1   . INSULIN ASPART PROT & ASPART (70-30) 100 UNIT/ML [place] SUSP   Subcutaneous   Inject 15-25 Units into the skin 2 (two) times daily with a meal. 35 units before breakfast and 25 units before dinner   10 mL   5   . INSULIN PEN NEEDLE 31G X 5 MM MISC   Does not apply   1 Container by Does not apply route 2 (two) times daily.   100 each   4   . INSULIN SYRINGE 30G X 1/2" 1 ML MISC   Does not apply   1 Container by Does not apply route as needed. Quantity sufficient for 2 injections daily   1 each   11   . LANTUS SOLOSTAR 100 UNIT/ML [place] SOLN      INJECT 26 UNITS INTO THE SKIN DAILY.   15 Syringe   6   . METRONIDAZOLE 500 MG PO TABS   Oral   Take 1 tablet (500 mg total) by mouth 2 (two) times daily.   14 tablet   0     BP 156/91  Pulse 90  Temp 98.2 F (36.8 C) (Oral)  Resp 20  SpO2 99%  Physical Exam  Nursing note and vitals reviewed. Constitutional: She is oriented to person, place, and time. She appears well-developed and well-nourished. No distress.  HENT:  Head: Normocephalic and atraumatic.  Mouth/Throat: No oropharyngeal exudate.  Eyes: EOM are normal. Pupils are equal, round, and  reactive to light. Right eye exhibits no discharge. Left eye exhibits no discharge.  Neck: Normal range of motion. Neck supple. No JVD present.  Cardiovascular: Normal rate, regular rhythm and normal heart sounds.   Pulmonary/Chest: Effort normal and breath sounds normal. No stridor. No respiratory distress. She exhibits no tenderness.  Abdominal: Soft. Bowel sounds are normal. She exhibits no distension and no mass. There is tenderness (L side of abd, primarily left flank. minimal elsewhere). There is no rebound and no guarding.  Musculoskeletal: Normal range of motion. She exhibits tenderness (L cvat, also mild on R). She exhibits no edema.  Neurological: She is alert and oriented to  person, place, and time. No cranial nerve deficit. She exhibits normal muscle tone.  Skin: Skin is warm and dry. No rash noted. She is not diaphoretic.  Psychiatric: She has a normal mood and affect. Her behavior is normal. Judgment and thought content normal.    ED Course  Procedures (including critical care time)  Labs Reviewed  CBC WITH DIFFERENTIAL - Abnormal; Notable for the following:    WBC 21.6 (*)     Neutrophils Relative 86 (*)     Neutro Abs 18.7 (*)     Lymphocytes Relative 5 (*)     Monocytes Absolute 1.9 (*)     All other components within normal limits  BASIC METABOLIC PANEL - Abnormal; Notable for the following:    Sodium 134 (*)     Chloride 94 (*)     Glucose, Bld 297 (*)     GFR calc non Af Amer 82 (*)     All other components within normal limits  LIPASE, BLOOD - Abnormal; Notable for the following:    Lipase 9 (*)     All other components within normal limits  URINALYSIS, ROUTINE W REFLEX MICROSCOPIC - Abnormal; Notable for the following:    APPearance HAZY (*)     Specific Gravity, Urine 1.035 (*)     Glucose, UA >1000 (*)     Hgb urine dipstick MODERATE (*)     Ketones, ur 40 (*)     Leukocytes, UA SMALL (*)     All other components within normal limits  URINE  MICROSCOPIC-ADD ON - Abnormal; Notable for the following:    Squamous Epithelial / LPF MANY (*)     Bacteria, UA MANY (*)     All other components within normal limits  POCT PREGNANCY, URINE  URINE CULTURE   US Pelvis Complete  12/13/2011  *RADIOLOGY REPORT*  Clinical Data: Vaginal discharge.  TRANSABDOMINAL ULTRASOUND OF PELVIS  Technique:  Transabdominal ultrasound examination of the pelvis was performed including evaluation of the uterus, ovaries, adnexal regions, and pelvic cul-de-sac.  Comparison:  None.  Findings:  Uterus:  Uterus is of normal size and echotexture measuring 7.1 x 3.9 x 5.7 cm.  No focal lesions are present.  Endometrium: The endometrium is within normal limits at 7.4 mm.  Right ovary: The right ovary is of normal size and echotexture measuring 3.1 x 2.0 x 2.1 cm.  Left ovary: Left ovary is of normal size and echotexture measuring 3.1 x 2.4 x 3.2 cm.  Other Findings:  No free fluid  IMPRESSION: Normal study. No evidence of pelvic mass or other significant abnormality.   Original Report Authenticated By: Marin Roberts, M.D.    US Renal  12/13/2011  *RADIOLOGY REPORT*  Clinical Data: Flank pain Question pyelonephritis.  Rule out renal stone.  RENAL/URINARY TRACT ULTRASOUND COMPLETE  Comparison:  CT of the abdomen pelvis 09/19/2010.  Findings:  Right Kidney:  No hydronephrosis.  Well-preserved cortex.  Normal size and parenchymal echotexture without focal abnormalities. The maximal length is 12.1 cm, within normal limits.  Left Kidney:  No hydronephrosis.  Well-preserved cortex.  Normal size and parenchymal echotexture without focal abnormalities. The maximal length is 12.9 cm, within normal limits.  Bladder:  Normal  IMPRESSION: Negative renal ultrasound.   Original Report Authenticated By: Marin Roberts, M.D.      1. Diabetes mellitus type 1, uncontrolled, insulin dependent   2. Type 1 diabetes mellitus   3. BV (bacterial vaginosis)   4. Dehydration   5.  Emesis     6. Hyperglycemia       MDM  6:37 PM patient states this is consistent with her prior episode of pyelonephritis. Since she is a type I insulin-dependent diabetic she is at increased risk for infection. He has had severe vomiting since yesterday and is unlikely to keep oral medicine down at home so she will likely need to be admitted. Will consult medicine for admission. Will give ceftriaxone for infection and morphine and Zofran and fluids for her symptoms. No history of kidney stone and the history is not consistent with one. I have considered and doubt potential causes of abdominal pain including bowel obstruction, bowel perforation, pancreatitis, appendicitis, cholecystitis, diverticulitis, intraabdominal abscess, abdominal aortic aneurysm, mesenteric ischemia and hernia.   Admitted to medicine to manage pyleo.      Warrick Parisian, MD 12/14/11 0010

## 2011-12-13 NOTE — ED Notes (Signed)
Pt found lying in waiting room restroom. States that she needs a bed in the back . Pt informed that there are none currently available. NAD noted. Pt states that she just feels better lying down. Pt able to stand and sit in wheelchair. Pt taken back out to lobby.

## 2011-12-14 ENCOUNTER — Inpatient Hospital Stay (HOSPITAL_COMMUNITY): Payer: Medicaid Other

## 2011-12-14 ENCOUNTER — Encounter (HOSPITAL_COMMUNITY): Payer: Self-pay | Admitting: *Deleted

## 2011-12-14 DIAGNOSIS — A499 Bacterial infection, unspecified: Secondary | ICD-10-CM

## 2011-12-14 DIAGNOSIS — B9689 Other specified bacterial agents as the cause of diseases classified elsewhere: Secondary | ICD-10-CM

## 2011-12-14 DIAGNOSIS — R112 Nausea with vomiting, unspecified: Secondary | ICD-10-CM

## 2011-12-14 DIAGNOSIS — N12 Tubulo-interstitial nephritis, not specified as acute or chronic: Secondary | ICD-10-CM

## 2011-12-14 DIAGNOSIS — N760 Acute vaginitis: Secondary | ICD-10-CM

## 2011-12-14 DIAGNOSIS — F17200 Nicotine dependence, unspecified, uncomplicated: Secondary | ICD-10-CM

## 2011-12-14 LAB — WET PREP, GENITAL: Clue Cells Wet Prep HPF POC: NONE SEEN

## 2011-12-14 LAB — CBC
HCT: 35.6 % — ABNORMAL LOW (ref 36.0–46.0)
Hemoglobin: 12 g/dL (ref 12.0–15.0)
MCHC: 33.7 g/dL (ref 30.0–36.0)
MCV: 89.2 fL (ref 78.0–100.0)
WBC: 23.9 10*3/uL — ABNORMAL HIGH (ref 4.0–10.5)

## 2011-12-14 LAB — BASIC METABOLIC PANEL
BUN: 14 mg/dL (ref 6–23)
CO2: 28 mEq/L (ref 19–32)
Chloride: 94 mEq/L — ABNORMAL LOW (ref 96–112)
GFR calc Af Amer: >90 mL/min (ref >90)
Potassium: 4.4 mEq/L (ref 3.5–5.1)

## 2011-12-14 LAB — GLUCOSE, CAPILLARY
Glucose-Capillary: 341 mg/dL — ABNORMAL HIGH (ref 70–99)
Glucose-Capillary: 357 mg/dL — ABNORMAL HIGH (ref 70–99)
Glucose-Capillary: 201 mg/dL — ABNORMAL HIGH (ref 70–99)

## 2011-12-14 LAB — HEMOGLOBIN A1C: Hgb A1c MFr Bld: 9.8 % — ABNORMAL HIGH (ref <5.7)

## 2011-12-14 LAB — TSH: TSH: 2.33 u[IU]/mL (ref 0.350–4.500)

## 2011-12-14 MED ORDER — INSULIN GLARGINE 100 UNIT/ML ~~LOC~~ SOLN
30.0000 [IU] | Freq: Every day | SUBCUTANEOUS | Status: DC
  Administered 2011-12-14: 30 [IU] via SUBCUTANEOUS

## 2011-12-14 MED ORDER — INSULIN ASPART 100 UNIT/ML ~~LOC~~ SOLN
0.0000 [IU] | Freq: Three times a day (TID) | SUBCUTANEOUS | Status: DC
  Administered 2011-12-14: 5 [IU] via SUBCUTANEOUS
  Administered 2011-12-14: 100 [IU] via SUBCUTANEOUS
  Administered 2011-12-15: 5 [IU] via SUBCUTANEOUS
  Administered 2011-12-15: 2 [IU] via SUBCUTANEOUS
  Administered 2011-12-16: 3 [IU] via SUBCUTANEOUS
  Administered 2011-12-16 – 2011-12-17 (×3): 5 [IU] via SUBCUTANEOUS
  Administered 2011-12-18: 2 [IU] via SUBCUTANEOUS
  Administered 2011-12-18: 5 [IU] via SUBCUTANEOUS
  Administered 2011-12-18 – 2011-12-19 (×2): 3 [IU] via SUBCUTANEOUS
  Administered 2011-12-19: 8 [IU] via SUBCUTANEOUS
  Administered 2011-12-19 – 2011-12-20 (×2): 3 [IU] via SUBCUTANEOUS
  Administered 2011-12-20: 13:00:00 via SUBCUTANEOUS

## 2011-12-14 MED ORDER — ACETAMINOPHEN 325 MG PO TABS
650.0000 mg | ORAL_TABLET | Freq: Four times a day (QID) | ORAL | Status: DC | PRN
  Administered 2011-12-14: 650 mg via ORAL
  Filled 2011-12-14: qty 2

## 2011-12-14 MED ORDER — PREDNISONE (PAK) 5 MG PO TABS: 5.0000 mg | ORAL_TABLET | Freq: Three times a day (TID) | ORAL | Status: DC

## 2011-12-14 MED ORDER — INSULIN ASPART 100 UNIT/ML ~~LOC~~ SOLN
4.0000 [IU] | Freq: Once | SUBCUTANEOUS | Status: AC
  Administered 2011-12-14: 4 [IU] via SUBCUTANEOUS

## 2011-12-14 MED ORDER — PROMETHAZINE HCL 25 MG/ML IJ SOLN
25.0000 mg | Freq: Four times a day (QID) | INTRAMUSCULAR | Status: DC | PRN
  Administered 2011-12-15 – 2011-12-19 (×12): 25 mg via INTRAVENOUS
  Filled 2011-12-14 (×15): qty 1

## 2011-12-14 MED ORDER — PREDNISONE (PAK) 5 MG PO TABS: 5.0000 mg | ORAL_TABLET | ORAL | Status: DC

## 2011-12-14 MED ORDER — PREDNISONE (PAK) 5 MG PO TABS: 10.0000 mg | ORAL_TABLET | Freq: Every evening | ORAL | Status: DC

## 2011-12-14 MED ORDER — PIPERACILLIN-TAZOBACTAM 3.375 G IVPB 30 MIN
3.3750 g | Freq: Three times a day (TID) | INTRAVENOUS | Status: DC
  Administered 2011-12-14 – 2011-12-15 (×3): 3.375 g via INTRAVENOUS
  Filled 2011-12-14 (×4): qty 50

## 2011-12-14 MED ORDER — METRONIDAZOLE IN NACL 5-0.79 MG/ML-% IV SOLN
500.0000 mg | Freq: Three times a day (TID) | INTRAVENOUS | Status: DC
  Administered 2011-12-14 – 2011-12-15 (×3): 500 mg via INTRAVENOUS
  Filled 2011-12-14 (×5): qty 100

## 2011-12-14 MED ORDER — ALUM & MAG HYDROXIDE-SIMETH 200-200-20 MG/5ML PO SUSP
10.0000 mL | Freq: Four times a day (QID) | ORAL | Status: DC | PRN
  Administered 2011-12-14 – 2011-12-19 (×2): 10 mL via ORAL
  Filled 2011-12-14 (×3): qty 30

## 2011-12-14 MED ORDER — HYDROMORPHONE HCL PF 1 MG/ML IJ SOLN
1.0000 mg | INTRAMUSCULAR | Status: AC | PRN
  Administered 2011-12-16 – 2011-12-17 (×3): 1 mg via INTRAVENOUS
  Filled 2011-12-14 (×4): qty 1

## 2011-12-14 MED ORDER — POTASSIUM CHLORIDE IN NACL 20-0.9 MEQ/L-% IV SOLN
INTRAVENOUS | Status: DC
  Administered 2011-12-15 – 2011-12-19 (×7): via INTRAVENOUS
  Administered 2011-12-20: 100 mL/h via INTRAVENOUS
  Filled 2011-12-14 (×17): qty 1000

## 2011-12-14 MED ORDER — IOHEXOL 300 MG/ML  SOLN
80.0000 mL | Freq: Once | INTRAMUSCULAR | Status: AC | PRN
  Administered 2011-12-14: 80 mL via INTRAVENOUS

## 2011-12-14 MED ORDER — PREDNISONE (PAK) 5 MG PO TABS: 5.0000 mg | ORAL_TABLET | Freq: Four times a day (QID) | ORAL | Status: DC

## 2011-12-14 MED ORDER — ONDANSETRON HCL 4 MG/2ML IJ SOLN
4.0000 mg | INTRAMUSCULAR | Status: DC
  Administered 2011-12-14 – 2011-12-19 (×10): 4 mg via INTRAVENOUS
  Filled 2011-12-14 (×14): qty 2

## 2011-12-14 MED ORDER — INSULIN ASPART 100 UNIT/ML ~~LOC~~ SOLN
0.0000 [IU] | Freq: Every day | SUBCUTANEOUS | Status: DC
  Administered 2011-12-17: 3 [IU] via SUBCUTANEOUS

## 2011-12-14 MED ORDER — DOXYCYCLINE HYCLATE 100 MG IV SOLR
100.0000 mg | Freq: Two times a day (BID) | INTRAVENOUS | Status: DC
  Administered 2011-12-14: 100 mg via INTRAVENOUS
  Filled 2011-12-14 (×2): qty 100

## 2011-12-14 MED ORDER — METOCLOPRAMIDE HCL 5 MG/ML IJ SOLN
5.0000 mg | Freq: Once | INTRAMUSCULAR | Status: AC
  Administered 2011-12-14: 5 mg via INTRAVENOUS
  Filled 2011-12-14: qty 1

## 2011-12-14 NOTE — Progress Notes (Signed)
PCP Family Medicine Note: Came to see Ms. Derrick around 14:30 today. She reported 6 episodes of bilious emesis since this morning. Continues to experience nausea with reglan and zofran. She has left sided abdominal pain. She has also been having hiccups which have been intermittently present since she became sick.  Physical Exam:  Appears uncomfortable, hiccupping Abdomen: soft, no rebound or guarding, tenderness present along left lower quadrant and suprapubic region. Present left CVA tenderness A/P 31 yo female with type 1 diabetes not well controled (A1C: 9.8),  HTN, HLD who presented with abdominal pain and vomiting. - abdominal pain: treated for pyelonephritis with zosyn day 1. evaluated by gynecology. PID thought to be less likely, although being empirically treated. Treated for BV with flagyl. Urine GC/Chl pending.  Abdominal CT showing left sided renal abscess which could be explanation for elevated WBC and pain. Consider IR consult for drainage. - nausea and vomiting: on reglan and zofran. May benefit from gastric emptying study at some point to confirm gastroparesis.  - Diabetes: currently on lantus 30 and novolog sliding scale.   Greatly appreciate the care provided by the inpatient team.   Marena Chancy, PGY-2 Family Medicine Resident

## 2011-12-14 NOTE — Progress Notes (Addendum)
[facility] PROGRESS NOTE  [name]:[id number] DOB: 1980-03-08 DOA: 12/13/2011 PCP: Marena Chancy, MD  Assessment/Plan: Pyelonephritis -Cannot rule out nephrolithiasis -Continue ceftriaxone -CT abdomen pelvis -Follow blood and urine culture -Broad antibiotic coverage pending CT results as well as culture results. -Antibiotics can be escalated as patient improves and culture data is clarified -Discontinue ceftriaxone, start Zosyn Intractable vomiting -Changed to Zofran 2 scheduled every 4 hours -Reglan 5 mg IV x1 -Continue IV fluids -Serum lipase was 9 Diabetes mellitus type 1 -Discontinue NovoLog 70/30 as patient is vomiting, poor by mouth intake -Continue Lantus bedtime  -Hemoglobin A1c 9.8  Vaginal discharge -Continue Flagyl IV--due to vomiting -Suspect B.vaginosis -Consult gynecology due to repeated recurrence -Patient states she has been monogamous for 3 years  -Empiric doxycycline--this can be discontinued if gynecology does not feel PID is likely  -order urine NAAT for GC/chlamydia  tobacco use  -Tobacco cessation discussed      Antibiotics:  Ceftriaxone November 13>>>  Doxycycline November 14 >>>  Azithromycin 500 mg x1 dose on November 13     Procedures/Studies: US Pelvis Complete  12/13/2011  *RADIOLOGY REPORT*  Clinical Data: Vaginal discharge.  TRANSABDOMINAL ULTRASOUND OF PELVIS  Technique:  Transabdominal ultrasound examination of the pelvis was performed including evaluation of the uterus, ovaries, adnexal regions, and pelvic cul-de-sac.  Comparison:  None.  Findings:  Uterus:  Uterus is of normal size and echotexture measuring 7.1 x 3.9 x 5.7 cm.  No focal lesions are present.  Endometrium: The endometrium is within normal limits at 7.4 mm.  Right ovary: The right ovary is of normal size and echotexture measuring 3.1 x 2.0 x 2.1 cm.  Left ovary: Left ovary is of normal size and echotexture measuring 3.1 x 2.4 x 3.2 cm.  Other  Findings:  No free fluid  IMPRESSION: Normal study. No evidence of pelvic mass or other significant abnormality.   Original Report Authenticated By: Marin Roberts, M.D.    US Renal  12/13/2011  *RADIOLOGY REPORT*  Clinical Data: Flank pain Question pyelonephritis.  Rule out renal stone.  RENAL/URINARY TRACT ULTRASOUND COMPLETE  Comparison:  CT of the abdomen pelvis 09/19/2010.  Findings:  Right Kidney:  No hydronephrosis.  Well-preserved cortex.  Normal size and parenchymal echotexture without focal abnormalities. The maximal length is 12.1 cm, within normal limits.  Left Kidney:  No hydronephrosis.  Well-preserved cortex.  Normal size and parenchymal echotexture without focal abnormalities. The maximal length is 12.9 cm, within normal limits.  Bladder:  Normal  IMPRESSION: Negative renal ultrasound.   Original Report Authenticated By: Marin Roberts, M.D.          Subjective: Patient continues to complain of vomiting. Denies any chest pain or shortness of breath. Complains of abdominal pain left greater than right side. Has intermittent dysuria and vaginal discharge. Complains of left flank pain. Denies any rashes, visual changes, synovitis.   Objective: Filed Vitals:   12/13/11 1800 12/13/11 2109 12/14/11 0508 12/14/11 0907  BP: 156/91 135/76 135/81 119/75  Pulse: 90 84 83 77  Temp: 98.2 F (36.8 C) 98.4 F (36.9 C) 98.5 F (36.9 C) 98.5 F (36.9 C)  TempSrc: Oral Oral Oral Oral  Resp: 20 16 16 18   Weight:  64.32 kg (141 lb 12.8 oz)    SpO2: 99% 100% 100% 98%    Intake/Output Summary (Last 24 hours) at 12/14/11 1148 Last data filed at 12/14/11 0655  Gross per 24 hour  Intake 632.92 ml  Output  1 ml  Net 631.92 ml   Weight change:  Exam:   General:  Pt is alert, follows commands appropriately, not in acute distress  HEENT: No icterus, No thrush, No neck mass, [place]/AT  Cardiovascular: RRR, S1/S2, no rubs, no gallops  Respiratory: CTA bilaterally, no  wheezing, no crackles, no rhonchi  Abdomen: Soft/+BS,, non distended, no guarding; epigastric, left upper quadrant tenderness--no rebound or peritoneal signs. Left costovertebral tenderness   Extremities: No edema, No lymphangitis, No petechiae, No rashes, no synovitis  Data Reviewed: Basic Metabolic Panel:  Lab 12/14/11 1610 12/13/11 1343  NA 135 134*  K 4.4 4.3  CL 94* 94*  CO2 28 29  GLUCOSE 292* 297*  BUN 14 11  CREATININE 0.89 0.92  CALCIUM 9.3 10.1  MG 1.9 --  PHOS -- --   Liver Function Tests: No results found for this basename: AST:5,ALT:5,ALKPHOS:5,BILITOT:5,PROT:5,ALBUMIN:5 in the last 168 hours  Lab 12/13/11 1343  LIPASE 9*  AMYLASE --   No results found for this basename: AMMONIA:5 in the last 168 hours CBC:  Lab 12/14/11 0515 12/13/11 1343  WBC 23.9* 21.6*  NEUTROABS -- 18.7*  HGB 12.0 13.2  HCT 35.6* 38.7  MCV 89.2 89.2  PLT 249 267   Cardiac Enzymes: No results found for this basename: CKTOTAL:5,CKMB:5,CKMBINDEX:5,TROPONINI:5 in the last 168 hours BNP: No components found with this basename: POCBNP:5 CBG:  Lab 12/14/11 0903 12/14/11 0757 12/13/11 2106  GLUCAP 357* 341* 356*    No results found for this or any previous visit (from the past 240 hour(s)).   Scheduled Meds:   . [COMPLETED] azithromycin  500 mg Intravenous Once  . cefTRIAXone (ROCEPHIN)  IV  1 g Intravenous Q24H  . insulin aspart  0-15 Units Subcutaneous TID WC  . insulin aspart  0-5 Units Subcutaneous QHS  . [COMPLETED] insulin aspart  4 Units Subcutaneous Once  . insulin glargine  26 Units Subcutaneous QHS  . magic mouthwash  5 mL Oral BID  . metoCLOPramide (REGLAN) injection  5 mg Intravenous Once  . metroNIDAZOLE  500 mg Oral BID  . [COMPLETED]  morphine injection  4 mg Intravenous Once  . [COMPLETED] ondansetron (ZOFRAN) IV  4 mg Intravenous Once  . [COMPLETED] ondansetron (ZOFRAN) IV  4 mg Intravenous Once  . [COMPLETED] ondansetron  4 mg Intravenous Once  .  ondansetron (ZOFRAN) IV  4 mg Intravenous Q4H  . [COMPLETED] ondansetron  8 mg Oral Once  . [COMPLETED] sodium chloride  1,000 mL Intravenous Once  . [EXPIRED] white petrolatum      . [COMPLETED] cefTRIAXone (ROCEPHIN)  IV  1 g Intravenous Once  . [DISCONTINUED] insulin aspart  0-9 Units Subcutaneous TID WC  . [DISCONTINUED] insulin aspart protamine-insulin aspart  15 Units Subcutaneous BID WC   Continuous Infusions:   . 0.9 % NaCl with KCl 20 mEq / L 125 mL/hr at 12/14/11 1053     Voris Tigert, DO  [facility] Pager [phone]  If 7PM-7AM, please contact night-coverage [email or web address] Password [email or web address] 12/14/2011, 11:48 AM   LOS: 1 day

## 2011-12-14 NOTE — Consult Note (Signed)
Recommendations: 1.  Check wet prep 2.  GC/Chlam off urine 3.  Continue Flagyl for now 4.  Highly doubt PID with negative exam, h/o BTL, and negative pelvic u/s.  Impression: 1.  Vaginal discharge, recurrent BV 2.  Unexplained N/V 3.  Type I DM-poorly controlled 4.  ? Pyelonephitis vs. Other GI process vs. Nephrolithiasis vs. Other--admitted with N/V in past and has been on Reglan for presumed gastroparesis, consider further w/u by primary team.  Reason for consultation: Pt. Is a P2 with 2 previous C-section and BTL in 2005 by me.  She has not been pregnant since and is not pregnant now.  She was in her usual state of health until the day prior to admission, when she developed back pain, initially bilaterally, now more left sided.  Pain radiated to flank on that side.  She also has persistent N/V, which is unresolved.  She also reports burning with urination.  She is admitted to the hospitalist service and we were asked to see her for persistent vaginal d/c.  She reports recurrent BV.  She does douche and takes baths.  She also has long h/o poorly controlled DM.  She had neg. GC/Chlam in 9/13.  She denies new sexual partner.  She has been treated with rocephin, zithromax and flagyl.  She continues on Rocephin and Flagyl.  She has, also, had recurrent UTI's, most recently E.Coli.  Past Medical History  Diagnosis Date  . Hyperlipidemia   . Hypertension   . Diabetes mellitus     A1c > 11. Patient does not check BG or take insulin regularly.  Dx at age 20.  . Tobacco abuse   . Incarceration     Was in jail for selling drugs - out Oct 2010.   Past Surgical History  Procedure Date  . Tubal ligation 2009   Scheduled Meds:   . [COMPLETED] azithromycin  500 mg Intravenous Once  . cefTRIAXone (ROCEPHIN)  IV  1 g Intravenous Q24H  . insulin aspart  0-15 Units Subcutaneous TID WC  . insulin aspart  0-5 Units Subcutaneous QHS  . [COMPLETED] insulin aspart  4 Units Subcutaneous Once  . insulin  glargine  26 Units Subcutaneous QHS  . magic mouthwash  5 mL Oral BID  . [COMPLETED] metoCLOPramide (REGLAN) injection  5 mg Intravenous Once  . metroNIDAZOLE  500 mg Oral BID  . [COMPLETED]  morphine injection  4 mg Intravenous Once  . [COMPLETED] ondansetron (ZOFRAN) IV  4 mg Intravenous Once  . [COMPLETED] ondansetron (ZOFRAN) IV  4 mg Intravenous Once  . [COMPLETED] ondansetron  4 mg Intravenous Once  . ondansetron (ZOFRAN) IV  4 mg Intravenous Q4H  . [COMPLETED] sodium chloride  1,000 mL Intravenous Once  . [EXPIRED] white petrolatum      . [COMPLETED] cefTRIAXone (ROCEPHIN)  IV  1 g Intravenous Once  . [DISCONTINUED] insulin aspart  0-9 Units Subcutaneous TID WC  . [DISCONTINUED] insulin aspart protamine-insulin aspart  15 Units Subcutaneous BID WC  . [DISCONTINUED] predniSONE  10 mg Oral Nightly  . [DISCONTINUED] predniSONE  10 mg Oral Nightly  . [DISCONTINUED] predniSONE  5 mg Oral PC lunch  . [DISCONTINUED] predniSONE  5 mg Oral PC supper  . [DISCONTINUED] predniSONE  5 mg Oral 3 x daily with food  . [DISCONTINUED] predniSONE  5 mg Oral 4X daily taper   Continuous Infusions:   . 0.9 % NaCl with KCl 20 mEq / L 125 mL/hr at 12/14/11 1053   PRN Meds:.albuterol,  guaiFENesin-dextromethorphan, HYDROcodone-acetaminophen, senna-docusate, [DISCONTINUED] ondansetron (ZOFRAN) IV, [DISCONTINUED] ondansetron  No Known Allergies  History reviewed. No pertinent family history.  History   Social History  . Marital Status: Single    Spouse Name: N/A    Number of Children: N/A  . Years of Education: N/A   Occupational History  . Not on file.   Social History Main Topics  . Smoking status: Current Every Day Smoker -- 0.3 packs/day    Types: Cigarettes  . Smokeless tobacco: Never Used     Comment: Quit 09/21/2010 while hospitalilzed/restarted again.   . Alcohol Use: 0.5 oz/week    1 drink(s) per week     Comment: Occasional.  . Drug Use: No  . Sexually Active: Yes   Other  Topics Concern  . Not on file   Social History Narrative  . No narrative on file   Review of Systems  Constitutional: Negative for fever and chills.  Respiratory: Negative for cough and shortness of breath.   Cardiovascular: Negative for chest pain, palpitations and leg swelling.  Gastrointestinal: Positive for nausea, vomiting and abdominal pain.  Genitourinary: Positive for dysuria and flank pain.  Musculoskeletal: Negative for myalgias and joint pain.   Physical Exam  Vitals reviewed. Constitutional: She appears well-developed and well-nourished.  HENT:  Head: Normocephalic and atraumatic.  Eyes: No scleral icterus.  Neck: Neck supple.  Cardiovascular: Normal rate.   Pulmonary/Chest: Effort normal.  Abdominal: Soft. There is tenderness.  Genitourinary: Vagina normal and uterus normal. Uterus is not enlarged and not tender. Cervix exhibits no motion tenderness and no discharge. Right adnexum displays no mass and no tenderness. Left adnexum displays no mass and no tenderness.   CBC    Component Value Date/Time   WBC 23.9* 12/14/2011 0515   RBC 3.99 12/14/2011 0515   HGB 12.0 12/14/2011 0515   HCT 35.6* 12/14/2011 0515   PLT 249 12/14/2011 0515   MCV 89.2 12/14/2011 0515   MCH 30.1 12/14/2011 0515   MCHC 33.7 12/14/2011 0515   RDW 14.0 12/14/2011 0515   LYMPHSABS 1.0 12/13/2011 1343   MONOABS 1.9* 12/13/2011 1343   EOSABS 0.0 12/13/2011 1343   BASOSABS 0.0 12/13/2011 1343   CMP     Component Value Date/Time   NA 135 12/14/2011 0515   K 4.4 12/14/2011 0515   CL 94* 12/14/2011 0515   CO2 28 12/14/2011 0515   GLUCOSE 292* 12/14/2011 0515   BUN 14 12/14/2011 0515   CREATININE 0.89 12/14/2011 0515   CREATININE 0.81 09/30/2010 1219   CALCIUM 9.3 12/14/2011 0515   PROT 6.7 07/02/2011 0629   ALBUMIN 3.5 07/02/2011 0629   AST 25 07/02/2011 0629   ALT 21 07/02/2011 0629   ALKPHOS 49 07/02/2011 0629   BILITOT 0.8 07/02/2011 0629   GFRNONAA 85* 12/14/2011 0515   GFRAA >90  12/14/2011 0515   Urinalysis    Component Value Date/Time   COLORURINE YELLOW 12/13/2011 1656   APPEARANCEUR HAZY* 12/13/2011 1656   LABSPEC 1.035* 12/13/2011 1656   PHURINE 5.5 12/13/2011 1656   GLUCOSEU >1000* 12/13/2011 1656   HGBUR MODERATE* 12/13/2011 1656   HGBUR negative 02/24/2010 1357   BILIRUBINUR NEGATIVE 12/13/2011 1656   BILIRUBINUR NEG 10/18/2011 1504   KETONESUR 40* 12/13/2011 1656   PROTEINUR NEGATIVE 12/13/2011 1656   UROBILINOGEN 0.2 12/13/2011 1656   UROBILINOGEN 0.2 10/18/2011 1504   NITRITE NEGATIVE 12/13/2011 1656   NITRITE NEG 10/18/2011 1504   LEUKOCYTESUR SMALL* 12/13/2011 1656     Drugs of Abuse  Component Value Date/Time   LABOPIA NONE DETECTED 09/20/2010 0139   COCAINSCRNUR NONE DETECTED 09/20/2010 0139   LABBENZ NONE DETECTED 09/20/2010 0139   AMPHETMU NONE DETECTED 09/20/2010 0139   THCU POSITIVE* 09/20/2010 0139   LABBARB NONE DETECTED 09/20/2010 0139    US Pelvis Complete  12/13/2011  *RADIOLOGY REPORT*  Clinical Data: Vaginal discharge.  TRANSABDOMINAL ULTRASOUND OF PELVIS  Technique:  Transabdominal ultrasound examination of the pelvis was performed including evaluation of the uterus, ovaries, adnexal regions, and pelvic cul-de-sac.  Comparison:  None.  Findings:  Uterus:  Uterus is of normal size and echotexture measuring 7.1 x 3.9 x 5.7 cm.  No focal lesions are present.  Endometrium: The endometrium is within normal limits at 7.4 mm.  Right ovary: The right ovary is of normal size and echotexture measuring 3.1 x 2.0 x 2.1 cm.  Left ovary: Left ovary is of normal size and echotexture measuring 3.1 x 2.4 x 3.2 cm.  Other Findings:  No free fluid  IMPRESSION: Normal study. No evidence of pelvic mass or other significant abnormality.   Original Report Authenticated By: Marin Roberts, M.D.    US Renal  12/13/2011  *RADIOLOGY REPORT*  Clinical Data: Flank pain Question pyelonephritis.  Rule out renal stone.  RENAL/URINARY TRACT ULTRASOUND COMPLETE   Comparison:  CT of the abdomen pelvis 09/19/2010.  Findings:  Right Kidney:  No hydronephrosis.  Well-preserved cortex.  Normal size and parenchymal echotexture without focal abnormalities. The maximal length is 12.1 cm, within normal limits.  Left Kidney:  No hydronephrosis.  Well-preserved cortex.  Normal size and parenchymal echotexture without focal abnormalities. The maximal length is 12.9 cm, within normal limits.  Bladder:  Normal  IMPRESSION: Negative renal ultrasound.   Original Report Authenticated By: Marin Roberts, M.D.      Thank you for sharing this pt.'s care with [place].  We will f/u on culture results.

## 2011-12-14 NOTE — Progress Notes (Signed)
UR COMPLETED  

## 2011-12-14 NOTE — Consult Note (Signed)
Urology Consult  Referring physician: Dr. Onalee Hua Tat Reason for referral: Left renal abscess  Chief Complaint: Left flank pain, nausea, omiting, fever  History of Present Illness: Patient is a 31 years old female who started having left-sided abdominal pain about a week ago. The pain became worse the past 2-3 days. It was then associated with nausea and vomiting. She also has been having frequency, urgency, dysuria and foul order of the urine. She has a history of recurrent urinary tract infections. She was admitted for pyelonephritis. Abdominal ultrasound was unremarkable. CT scan without contrast showed  an inhomogenous area of the left upper pole kidney that measures 4 x 3 cm and is a suggestive of a renal abscess. She is on Ceftin, metronidazole and Zithromax. She is now afebrile but continues to have nausea and vomiting and epigastric pain at this time. She has a history of diabetes. Her white count is 21,000. Urinalysis shows too numerous to count WBCs, many bacteria.  Past Medical History  Diagnosis Date  . Hyperlipidemia   . Hypertension   . Diabetes mellitus     A1c > 11. Patient does not check BG or take insulin regularly.  Dx at age 75.  . Tobacco abuse   . Incarceration     Was in jail for selling drugs - out Oct 2010.   Past Surgical History  Procedure Date  . Tubal ligation 2009    Medications: Rocephin, Zithromax, metronidazole, insulin, Zofran Allergies: No Known Allergies  History reviewed. No pertinent family history. Social History:  reports that she has been smoking Cigarettes.  She has been smoking about .3 packs per day. She has never used smokeless tobacco. She reports that she drinks about .5 ounces of alcohol per week. She reports that she does not use illicit drugs.  ROS: All systems are reviewed and negative except as noted.   Physical Exam:  Vital signs in last 24 hours: Temp:  [98.5 F (36.9 C)-101.5 F (38.6 C)] 99.3 F (37.4 C) (11/14 2046) Pulse  Rate:  [77-101] 88  (11/14 2046) Resp:  [16-18] 18  (11/14 2046) BP: (119-158)/(75-92) 156/92 mmHg (11/14 2046) SpO2:  [94 %-100 %] 100 % (11/14 2046) Weight:  [145 lb 14.4 oz (66.18 kg)] 145 lb 14.4 oz (66.18 kg) (11/14 2046)  Ears, nose, throat: Within normal limits Neck: Supple, no cervical adenopathy, no thyromegaly Cardiovascular: Skin warm; not flushed Respiratory: Breaths quiet; no shortness of breath Abdomen: No masses, tender in the left flank, moderate left CVA tenderness Neurological: Normal sensation to touch Musculoskeletal: Normal motor function arms and legs Lymphatics: No inguinal adenopathy Skin: No rashes Genitourinary: Deferred. Patient was seen by her gynecologist earlier today.  Laboratory Data:  Results for orders placed during the hospital encounter of 12/13/11 (from the past 72 hour(s))  CBC WITH DIFFERENTIAL     Status: Abnormal   Collection Time   12/13/11  1:43 PM      Component Value Range Comment   WBC 21.6 (*) 4.0 - 10.5 K/uL    RBC 4.34  3.87 - 5.11 MIL/uL    Hemoglobin 13.2  12.0 - 15.0 g/dL    HCT 64.4  03.4 - 74.2 %    MCV 89.2  78.0 - 100.0 fL    MCH 30.4  26.0 - 34.0 pg    MCHC 34.1  30.0 - 36.0 g/dL    RDW 59.5  63.8 - 75.6 %    Platelets 267  150 - 400 K/uL    Neutrophils  Relative 86 (*) 43 - 77 %    Neutro Abs 18.7 (*) 1.7 - 7.7 K/uL    Lymphocytes Relative 5 (*) 12 - 46 %    Lymphs Abs 1.0  0.7 - 4.0 K/uL    Monocytes Relative 9  3 - 12 %    Monocytes Absolute 1.9 (*) 0.1 - 1.0 K/uL    Eosinophils Relative 0  0 - 5 %    Eosinophils Absolute 0.0  0.0 - 0.7 K/uL    Basophils Relative 0  0 - 1 %    Basophils Absolute 0.0  0.0 - 0.1 K/uL   BASIC METABOLIC PANEL     Status: Abnormal   Collection Time   12/13/11  1:43 PM      Component Value Range Comment   Sodium 134 (*) 135 - 145 mEq/L    Potassium 4.3  3.5 - 5.1 mEq/L    Chloride 94 (*) 96 - 112 mEq/L    CO2 29  19 - 32 mEq/L    Glucose, Bld 297 (*) 70 - 99 mg/dL    BUN 11  6 -  23 mg/dL    Creatinine, Ser 1.47  0.50 - 1.10 mg/dL    Calcium 82.9  8.4 - 10.5 mg/dL    GFR calc non Af Amer 82 (*) >90 mL/min    GFR calc Af Amer >90  >90 mL/min   LIPASE, BLOOD     Status: Abnormal   Collection Time   12/13/11  1:43 PM      Component Value Range Comment   Lipase 9 (*) 11 - 59 U/L   URINALYSIS, ROUTINE W REFLEX MICROSCOPIC     Status: Abnormal   Collection Time   12/13/11  4:56 PM      Component Value Range Comment   Color, Urine YELLOW  YELLOW    APPearance HAZY (*) CLEAR    Specific Gravity, Urine 1.035 (*) 1.005 - 1.030    pH 5.5  5.0 - 8.0    Glucose, UA >1000 (*) NEGATIVE mg/dL    Hgb urine dipstick MODERATE (*) NEGATIVE    Bilirubin Urine NEGATIVE  NEGATIVE    Ketones, ur 40 (*) NEGATIVE mg/dL    Protein, ur NEGATIVE  NEGATIVE mg/dL    Urobilinogen, UA 0.2  0.0 - 1.0 mg/dL    Nitrite NEGATIVE  NEGATIVE    Leukocytes, UA SMALL (*) NEGATIVE   URINE MICROSCOPIC-ADD ON     Status: Abnormal   Collection Time   12/13/11  4:56 PM      Component Value Range Comment   Squamous Epithelial / LPF MANY (*) RARE    WBC, UA TOO NUMEROUS TO COUNT  <3 WBC/hpf    RBC / HPF 7-10  <3 RBC/hpf    Bacteria, UA MANY (*) RARE   POCT PREGNANCY, URINE     Status: Normal   Collection Time   12/13/11  5:05 PM      Component Value Range Comment   Preg Test, Ur NEGATIVE  NEGATIVE   GLUCOSE, CAPILLARY     Status: Abnormal   Collection Time   12/13/11  9:06 PM      Component Value Range Comment   Glucose-Capillary 356 (*) 70 - 99 mg/dL   TSH     Status: Normal   Collection Time   12/13/11  9:46 PM      Component Value Range Comment   TSH 2.330  0.350 - 4.500 uIU/mL   HEMOGLOBIN  A1C     Status: Abnormal   Collection Time   12/13/11  9:46 PM      Component Value Range Comment   Hemoglobin A1C 9.8 (*) <5.7 %    Mean Plasma Glucose 235 (*) <117 mg/dL   BASIC METABOLIC PANEL     Status: Abnormal   Collection Time   12/14/11  5:15 AM      Component Value Range Comment    Sodium 135  135 - 145 mEq/L    Potassium 4.4  3.5 - 5.1 mEq/L    Chloride 94 (*) 96 - 112 mEq/L    CO2 28  19 - 32 mEq/L    Glucose, Bld 292 (*) 70 - 99 mg/dL    BUN 14  6 - 23 mg/dL    Creatinine, Ser 1.61  0.50 - 1.10 mg/dL    Calcium 9.3  8.4 - 09.6 mg/dL    GFR calc non Af Amer 85 (*) >90 mL/min    GFR calc Af Amer >90  >90 mL/min   CBC     Status: Abnormal   Collection Time   12/14/11  5:15 AM      Component Value Range Comment   WBC 23.9 (*) 4.0 - 10.5 K/uL    RBC 3.99  3.87 - 5.11 MIL/uL    Hemoglobin 12.0  12.0 - 15.0 g/dL    HCT 04.5 (*) 40.9 - 46.0 %    MCV 89.2  78.0 - 100.0 fL    MCH 30.1  26.0 - 34.0 pg    MCHC 33.7  30.0 - 36.0 g/dL    RDW 81.1  91.4 - 78.2 %    Platelets 249  150 - 400 K/uL   MAGNESIUM     Status: Normal   Collection Time   12/14/11  5:15 AM      Component Value Range Comment   Magnesium 1.9  1.5 - 2.5 mg/dL   GLUCOSE, CAPILLARY     Status: Abnormal   Collection Time   12/14/11  7:57 AM      Component Value Range Comment   Glucose-Capillary 341 (*) 70 - 99 mg/dL   GLUCOSE, CAPILLARY     Status: Abnormal   Collection Time   12/14/11  9:03 AM      Component Value Range Comment   Glucose-Capillary 357 (*) 70 - 99 mg/dL   WET PREP, GENITAL     Status: Normal   Collection Time   12/14/11 11:28 AM      Component Value Range Comment   Yeast Wet Prep HPF POC NONE SEEN  NONE SEEN    Trich, Wet Prep NONE SEEN  NONE SEEN    Clue Cells Wet Prep HPF POC NONE SEEN  NONE SEEN    WBC, Wet Prep HPF POC NONE SEEN  NONE SEEN   GLUCOSE, CAPILLARY     Status: Abnormal   Collection Time   12/14/11 12:06 PM      Component Value Range Comment   Glucose-Capillary 206 (*) 70 - 99 mg/dL   GLUCOSE, CAPILLARY     Status: Abnormal   Collection Time   12/14/11  5:40 PM      Component Value Range Comment   Glucose-Capillary 201 (*) 70 - 99 mg/dL    Recent Results (from the past 240 hour(s))  WET PREP, GENITAL     Status: Normal   Collection Time   12/14/11  11:28 AM      Component Value  Range Status Comment   Yeast Wet Prep HPF POC NONE SEEN  NONE SEEN Final    Trich, Wet Prep NONE SEEN  NONE SEEN Final    Clue Cells Wet Prep HPF POC NONE SEEN  NONE SEEN Final    WBC, Wet Prep HPF POC NONE SEEN  NONE SEEN Final    Creatinine:  Basename 12/14/11 0515 12/13/11 1343  CREATININE 0.89 0.92    Xrays: I independently reviewed the CT scan and the findings are as noted in the history of present illness  Impression/Assessment:  Left renal abscess, urinary tract infection, diabetes.  Plan:  Suggest: Percutaneous drainage of the left renal abscess. Continue IV antibiotics.  Will follow the patient with you.  Ceasar Decandia-HENRY 12/14/2011, 10:24 PM

## 2011-12-14 NOTE — Progress Notes (Signed)
I was informed of abnormal CT abdomen results by the nurse this afternoon. CT abdomen showed left upper pole renal ill-defined lesion suggestive of abscess. However, due to lack of contrast and poor fascial planes, appendicitis cannot be absolutely ruled out. I personally spoke with Dr. Lindie Spruce from general surgery who stated that he will review the films but recommended calling Urology. Given the patient's clinical history suspicion was low for appendicitis. I called Dr. Brunilda Payor  from urology for consultation. He stated that he would see the patient in consultation, but he recommended consultation with interventional radiology for possible drainage. I tried to call her interventional radiology. Unfortunately, there was no physician in house at this time, and the schedule has gone for the day. It was recommended a call back first thing in the morning to schedule the interventional procedure. The patient's fever has improved from this afternoon.  The patient remains hemodynamically stable.  Continue broad spectrum antibiotics.

## 2011-12-14 NOTE — Progress Notes (Signed)
Results for ZHANE, DONLAN (MRN [id number]) as of 12/14/2011 10:27  Ref. Range 12/14/2011 07:57 12/14/2011 09:03  Glucose-Capillary Latest Range: 70-99 mg/dL 409 (H) 811 (H)   Lantus 26 units QHS started last night around 11pm.    Clarified patient's home insulin regimen today: Lantus 30 units QHS & Novolog 5-6 units tid with meals.  MD, please increase Lantus to 30 units QHS. If PO intake OK, may want to add meal coverage- Novolog 4 units tid with meals.  Will follow. Ambrose Finland RN, MSN, CDE Diabetes Coordinator Inpatient Diabetes Program [phone]

## 2011-12-15 ENCOUNTER — Inpatient Hospital Stay (HOSPITAL_COMMUNITY): Payer: Medicaid Other

## 2011-12-15 LAB — COMPREHENSIVE METABOLIC PANEL
ALT: 38 U/L — ABNORMAL HIGH (ref 0–35)
AST: 20 U/L (ref 0–37)
Albumin: 3.4 g/dL — ABNORMAL LOW (ref 3.5–5.2)
Alkaline Phosphatase: 124 U/L — ABNORMAL HIGH (ref 39–117)
Chloride: 93 mEq/L — ABNORMAL LOW (ref 96–112)
Potassium: 3.7 mEq/L (ref 3.5–5.1)
Sodium: 132 mEq/L — ABNORMAL LOW (ref 135–145)
Total Bilirubin: 0.5 mg/dL (ref 0.3–1.2)
Total Protein: 7.9 g/dL (ref 6.0–8.3)

## 2011-12-15 LAB — PROTIME-INR
INR: 1.23 (ref 0.00–1.49)
Prothrombin Time: 15.3 seconds — ABNORMAL HIGH (ref 11.6–15.2)

## 2011-12-15 LAB — CBC WITH DIFFERENTIAL/PLATELET
Basophils Absolute: 0 10*3/uL (ref 0.0–0.1)
Basophils Relative: 0 % (ref 0–1)
Eosinophils Absolute: 0 10*3/uL (ref 0.0–0.7)
Hemoglobin: 11.9 g/dL — ABNORMAL LOW (ref 12.0–15.0)
MCH: 29.6 pg (ref 26.0–34.0)
MCHC: 33.2 g/dL (ref 30.0–36.0)
Monocytes Relative: 11 % (ref 3–12)
Neutro Abs: 14.7 10*3/uL — ABNORMAL HIGH (ref 1.7–7.7)
Neutrophils Relative %: 82 % — ABNORMAL HIGH (ref 43–77)
Platelets: 259 10*3/uL (ref 150–400)
RDW: 13.9 % (ref 11.5–15.5)

## 2011-12-15 LAB — APTT: aPTT: 31 seconds (ref 24–37)

## 2011-12-15 LAB — GLUCOSE, CAPILLARY
Glucose-Capillary: 133 mg/dL — ABNORMAL HIGH (ref 70–99)
Glucose-Capillary: 137 mg/dL — ABNORMAL HIGH (ref 70–99)

## 2011-12-15 MED ORDER — DEXTROSE 50 % IV SOLN
25.0000 mL | Freq: Once | INTRAVENOUS | Status: AC | PRN
  Administered 2011-12-15: 25 mL via INTRAVENOUS

## 2011-12-15 MED ORDER — FENTANYL CITRATE 0.05 MG/ML IJ SOLN
INTRAMUSCULAR | Status: AC | PRN
  Administered 2011-12-15: 50 ug via INTRAVENOUS

## 2011-12-15 MED ORDER — DOXYCYCLINE HYCLATE 100 MG IV SOLR
100.0000 mg | Freq: Two times a day (BID) | INTRAVENOUS | Status: DC
  Administered 2011-12-15: 100 mg via INTRAVENOUS
  Filled 2011-12-15 (×3): qty 100

## 2011-12-15 MED ORDER — DEXTROSE 50 % IV SOLN
INTRAVENOUS | Status: AC
  Filled 2011-12-15: qty 50

## 2011-12-15 MED ORDER — GLUCOSE-VITAMIN C 4-6 GM-MG PO CHEW: 4.0000 | CHEWABLE_TABLET | ORAL | Status: DC | PRN

## 2011-12-15 MED ORDER — MIDAZOLAM HCL 2 MG/2ML IJ SOLN
INTRAMUSCULAR | Status: AC
  Filled 2011-12-15: qty 4

## 2011-12-15 MED ORDER — PIPERACILLIN-TAZOBACTAM 3.375 G IVPB
3.3750 g | Freq: Three times a day (TID) | INTRAVENOUS | Status: DC
  Administered 2011-12-15 – 2011-12-16 (×5): 3.375 g via INTRAVENOUS
  Filled 2011-12-15 (×6): qty 50

## 2011-12-15 MED ORDER — GLUCOSE-VITAMIN C 4-6 GM-MG PO CHEW
CHEWABLE_TABLET | ORAL | Status: AC
  Filled 2011-12-15: qty 1

## 2011-12-15 MED ORDER — GLUCOSE 40 % PO GEL
1.0000 | ORAL | Status: DC | PRN
  Filled 2011-12-15: qty 1

## 2011-12-15 MED ORDER — MIDAZOLAM HCL 2 MG/2ML IJ SOLN
INTRAMUSCULAR | Status: AC | PRN
  Administered 2011-12-15: 1 mg via INTRAVENOUS

## 2011-12-15 MED ORDER — INSULIN GLARGINE 100 UNIT/ML ~~LOC~~ SOLN
10.0000 [IU] | Freq: Every day | SUBCUTANEOUS | Status: DC
  Administered 2011-12-16: 10 [IU] via SUBCUTANEOUS

## 2011-12-15 MED ORDER — FENTANYL CITRATE 0.05 MG/ML IJ SOLN
INTRAMUSCULAR | Status: AC
  Filled 2011-12-15: qty 4

## 2011-12-15 NOTE — Progress Notes (Signed)
Subjective: Patient reports : Feels better.  Has less flank pain. Still c/o nausea.  Objective: Vital signs in last 24 hours: Temp:  [98.6 F (37 C)-101.9 F (38.8 C)] 98.9 F (37.2 C) (11/15 1650) Pulse Rate:  [72-102] 85  (11/15 1650) Resp:  [12-29] 16  (11/15 1650) BP: (114-157)/(67-94) 118/68 mmHg (11/15 1650) SpO2:  [92 %-100 %] 98 % (11/15 1650) Weight:  [145 lb 14.4 oz (66.18 kg)] 145 lb 14.4 oz (66.18 kg) (11/14 2046)  Intake/Output from previous day: 11/14 0701 - 11/15 0700 In: 1029 [I.V.:625; IV Piggyback:404] Out: 2 [Urine:2] Intake/Output this shift: Total I/O In: 60 [P.O.:60] Out: -   Physical Exam:  General: Sleepy buy but arousable Lungs - Normal respiratory effort, chest expands symmetrically.  Abdomen - Soft, non-tender & non-distended Aspiration of the abnormal area of the left upper pole produced only 3 cc of bloody drainage.  Specimen was sent for culture. White count is down to 17,000. Blood and urine culture: pending. Lab Results:  Basename 12/15/11 0600 12/14/11 0515 12/13/11 1343  HGB 11.9* 12.0 13.2  HCT 35.8* 35.6* 38.7   BMET  Basename 12/15/11 0600 12/14/11 0515  NA 132* 135  K 3.7 4.4  CL 93* 94*  CO2 25 28  GLUCOSE 202* 292*  BUN 7 14  CREATININE 0.70 0.89  CALCIUM 9.0 9.3    Basename 12/15/11 1105  LABPT --  INR 1.23   No results found for this basename: LABURIN:1 in the last 72 hours Results for orders placed during the hospital encounter of 12/13/11  CULTURE, BLOOD (ROUTINE X 2)     Status: Normal (Preliminary result)   Collection Time   12/13/11  7:15 PM      Component Value Range Status Comment   Specimen Description BLOOD ARM RIGHT   Final    Special Requests BOTTLES DRAWN AEROBIC AND ANAEROBIC 10CC   Final    Culture  Setup Time 12/14/2011 04:14   Final    Culture     Final    Value:        BLOOD CULTURE RECEIVED NO GROWTH TO DATE CULTURE WILL BE HELD FOR 5 DAYS BEFORE ISSUING A FINAL NEGATIVE REPORT   Report  Status PENDING   Incomplete   CULTURE, BLOOD (ROUTINE X 2)     Status: Normal (Preliminary result)   Collection Time   12/13/11  7:20 PM      Component Value Range Status Comment   Specimen Description BLOOD HAND RIGHT   Final    Special Requests BOTTLES DRAWN AEROBIC ONLY 10CC   Final    Culture  Setup Time 12/14/2011 04:14   Final    Culture     Final    Value:        BLOOD CULTURE RECEIVED NO GROWTH TO DATE CULTURE WILL BE HELD FOR 5 DAYS BEFORE ISSUING A FINAL NEGATIVE REPORT   Report Status PENDING   Incomplete   WET PREP, GENITAL     Status: Normal   Collection Time   12/14/11 11:28 AM      Component Value Range Status Comment   Yeast Wet Prep HPF POC NONE SEEN  NONE SEEN Final    Trich, Wet Prep NONE SEEN  NONE SEEN Final    Clue Cells Wet Prep HPF POC NONE SEEN  NONE SEEN Final    WBC, Wet Prep HPF POC NONE SEEN  NONE SEEN Final   GC/CHLAMYDIA PROBE AMP     Status: Normal  Collection Time   12/14/11  3:19 PM      Component Value Range Status Comment   CT Probe RNA NEGATIVE  NEGATIVE Final    GC Probe RNA NEGATIVE  NEGATIVE Final     Studies/Results: US Pelvis Complete  12/13/2011  *RADIOLOGY REPORT*  Clinical Data: Vaginal discharge.  TRANSABDOMINAL ULTRASOUND OF PELVIS  Technique:  Transabdominal ultrasound examination of the pelvis was performed including evaluation of the uterus, ovaries, adnexal regions, and pelvic cul-de-sac.  Comparison:  None.  Findings:  Uterus:  Uterus is of normal size and echotexture measuring 7.1 x 3.9 x 5.7 cm.  No focal lesions are present.  Endometrium: The endometrium is within normal limits at 7.4 mm.  Right ovary: The right ovary is of normal size and echotexture measuring 3.1 x 2.0 x 2.1 cm.  Left ovary: Left ovary is of normal size and echotexture measuring 3.1 x 2.4 x 3.2 cm.  Other Findings:  No free fluid  IMPRESSION: Normal study. No evidence of pelvic mass or other significant abnormality.   Original Report Authenticated By: Marin Roberts, M.D.    Ct Abdomen Pelvis W Contrast  12/14/2011  *RADIOLOGY REPORT*  Clinical Data: Pyelonephritis.  Abdominal pain and vomiting. Diabetic hypertensive patient with hyperlipidemia.  CT ABDOMEN AND PELVIS WITH CONTRAST  Technique:  Multidetector CT imaging of the abdomen and pelvis was performed following the standard protocol during bolus administration of intravenous contrast.  Contrast: 80mL OMNIPAQUE IOHEXOL 300 MG/ML  SOLN  Comparison: 12/13/2011 ultrasound.  09/19/2010 CT.  Findings: Left upper pole heterogeneous ill-defined lesions spanning over 3.9 x 4.2 x 3.2 cm.  The appearance is consistent with renal abscesses given the provided history (rather than malignancy).  Periaortic adenopathy.  No right renal pyelonephritis or abscess detected.  Fluid within the pelvis.  This may be related to recent ovulation as there is a corpus luteum cyst anterior left aspect of the pelvis.  Because of the fluid, lack of fat planes and lack of contrast within bowel, it is difficult to exclude primary bowel inflammatory process including appendicitis.  No free intraperitoneal air.  No worrisome focal liver, splenic, adrenal or or pancreatic lesion.  Post tubal ligation.  No worrisome bony destructive lesion.  IMPRESSION: Left upper pole heterogeneous ill-defined lesions spanning over 3.9 x 4.2 x 3.2 cm.  The appearance is consistent with renal abscesses given the provided history.  Periaortic adenopathy.  Fluid within the pelvis.  This may be related to recent ovulation as there is a corpus luteum cyst anterior left aspect of the pelvis.  Because of the fluid, lack of fat planes and lack of contrast within bowel, it is difficult to exclude primary bowel inflammatory process including appendicitis.  This has been made a PRA call report utilizing dashboard call feature.   Original Report Authenticated By: Lacy Duverney, M.D.    US Renal  12/13/2011  *RADIOLOGY REPORT*  Clinical Data: Flank pain Question  pyelonephritis.  Rule out renal stone.  RENAL/URINARY TRACT ULTRASOUND COMPLETE  Comparison:  CT of the abdomen pelvis 09/19/2010.  Findings:  Right Kidney:  No hydronephrosis.  Well-preserved cortex.  Normal size and parenchymal echotexture without focal abnormalities. The maximal length is 12.1 cm, within normal limits.  Left Kidney:  No hydronephrosis.  Well-preserved cortex.  Normal size and parenchymal echotexture without focal abnormalities. The maximal length is 12.9 cm, within normal limits.  Bladder:  Normal  IMPRESSION: Negative renal ultrasound.   Original Report Authenticated By: Marin Roberts, M.D.  Ct Aspiration  12/15/2011  *RADIOLOGY REPORT*  Indication: Pyelonephritis, concern for left-sided renal abscess  CT GUIDED LEFT RENAL FLUID COLLECTION ASPIRATION  Comparison: CT abdomen pelvis - 12/14/2011  Medications: Fentanyl 75 mcg IV; Versed 2 mg IV  Total Moderate Sedation time: 15 minutes  Contrast: None  Complications: None immediate  Technique / Findings:  Informed written consent was obtained from the patient after a discussion of the risks, benefits and alternatives to treatment. The patient was placed prone on the CT gantry and a pre procedural CT was performed re-demonstrating the known abscess/fluid collection within the ill-defined area of hypoattenuation involving the posterior superior aspect of the left renal cortex, concerning for renal abscess.  The procedure was planned.   A timeout was performed prior to the initiation of the procedure.  The left flank prepped and draped in the usual sterile fashion. The overlying soft tissues were anesthetized with 1% lidocaine with epinephrine.  Appropriate trajectory was planned with the use of a 22 gauge spinal needle.  A 5-French multi side-hole Yueh sheath needle was advanced into the ill-defined area of hypoattenuation of the superior pole of the left kidney.  The Yueh sheath needle was retracted as forceful aspiration was performed  but only yielded approximately 3 ml of blood tinged fluid.  As such, a drainage catheter was not placed. Samples were sent to the Laboratory as requested by the ordering clinical pain.  A dressing was placed.  The patient tolerated the procedure well without immediate post procedural complication.  Impression:  Successful CT guided aspiration of ill-defined area of hypoattenuation involving the posterior superior pole of the right kidney yielding approximately 3 mL of bloody fluid.  Samples were sent to the laboratory as requested by the ordering clinical team.   Original Report Authenticated By: Tacey Ruiz, MD     Assessment/Plan:  Left renal abscess  Continue IV antibiotics   LOS: 2 days   [name] 12/15/2011, 6:31 PM

## 2011-12-15 NOTE — Procedures (Signed)
Technically successful CT guided aspiration of left renal abscess only yielding @ 2 cc of bloody material.  No drainage catheter placed.  No immediate post procedural complication.

## 2011-12-15 NOTE — Progress Notes (Signed)
[facility] Attending Daily Note:  Renold Don MD  [phone] pager  Family Practice pager:  [phone] I have seen and examined this patient and have reviewed their chart. I have discussed this patient with the resident. I agree with the resident's findings, assessment and care plan.  Examined patient after procedure.  Groggy but arousable.  Complains of nausea, but states this is better than it has been.   Exam:   Gen:  Sleeping but arousable.   Heart:  Slightly tachcardic, regular rhythm Lungs: Clear Abdomen:  Minimally tender epigastrum.  Otherwise benign.   Back:  No draining or bleeding from drainage site. Ext:  No edema  Imp/plan: 31 yo F admitted for pyelo vs PID and found to have Left perinephric abscess on CT abdomen.  Taken back by IR for drainage today. 1.  Abscess:  - evidently some confusion over when exact transfer of care took place.  It was our understanding that we took over care yesterday, but last night hospitalist ordered for surgery and urology consult.  In either case, we are happy to assume care now. - 2 cm bloody fluid obtained via drainage today. - Continue Zosyn for gram neg and anaerobe coverage - anti-emetics for nausea, analgesics for pain relief - follow fever curve and white count.

## 2011-12-15 NOTE — Progress Notes (Signed)
CBG: 56 @ 16:00  Treatment: D50 IV 25 mL  Symptoms: None  Follow-up CBG: Time:1754 CBG Result:137  Possible Reasons for Event: Inadequate meal intake related to nausea  Comments/MD notified: Note: late entry at 19:31; Dr. Ermalinda Memos notified of Hypoglycemic event.    Audie Pinto

## 2011-12-15 NOTE — Progress Notes (Signed)
Family Medicine Teaching Service Daily Progress Note/Transfer Note Service Page: [phone]  Patient Assessment: 31 yo female with T1DM(A1C: 9.8), HTN, HLD who presented with abdominal pain and vomiting likely due to renal abcess.   Hospital Coarse She was initially admitted to [facility] service with 2-3 days of nausea vomiting and flank pain and subsequently started on IV rocephin to treat pyelonephritis. OB/Gyn was consulted to evaluate for the possibility of PID and eval vaginal discharge, who felt PID was unlikely. Antibiotics were escalated to zosyn as she was not improving.  After a renal abscess was found on CT general surgery was contacted who reviewed the CT and felt that urology was the most appropriate consult. Urology evaluated the renal abscess and suggested continued Abx coverage and percutaneous drainage.    Subjective:  Still having nausea and vomiting this Am. Notes vomiting times 7 overnight, states that it is mostly stomach acid with small amount of blood and now turning green. Pain continued in her back and abdomen, meds are helping.  Hasn't noticed if her vaginal discharged has continued or not. Wondering when they are going to drain the abscess.   Objective: Temp:  [98.6 F (37 C)-101.5 F (38.6 C)] 98.6 F (37 C) (11/15 0622) Pulse Rate:  [72-101] 72  (11/15 0622) Resp:  [18] 18  (11/15 0622) BP: (152-158)/(76-92) 157/83 mmHg (11/15 0622) SpO2:  [92 %-100 %] 92 % (11/15 0622) Weight:  [145 lb 14.4 oz (66.18 kg)] 145 lb 14.4 oz (66.18 kg) (11/14 2046) Exam:  Gen: NAD, sleeping, awakes easily, cooperative with exam, speaks clearly HEENT: NCAT CV: RRR, good S1/S2, no murmur Resp: CTABL, no wheezes, non-labored Abd: Soft, tender to palpation of LUQ and LLQ thats greatest in LLQ, no guarding Ext: No edema, 2+ DP pulses.  Neuro: Alert and oriented, No gross deficits   I have reviewed the patient's medications, labs, imaging, and diagnostic testing.  Notable results are  summarized below.  CBC BMET   Lab 12/15/11 0600 12/14/11 0515 12/13/11 1343  WBC 17.9* 23.9* 21.6*  HGB 11.9* 12.0 13.2  HCT 35.8* 35.6* 38.7  PLT 259 249 267    Lab 12/15/11 0600 12/14/11 0515 12/13/11 1343  NA 132* 135 134*  K 3.7 4.4 4.3  CL 93* 94* 94*  CO2 25 28 29   BUN 7 14 11   CREATININE 0.70 0.89 0.92  GLUCOSE 202* 292* 297*  CALCIUM 9.0 9.3 10.1     Imaging/Diagnostic Tests: Renal US 11/13 IMPRESSION:  Negative renal ultrasound.  Pelvic US 11/13 IMPRESSION:  Normal study. No evidence of pelvic mass or other significant  abnormality.  CT abdomen with contrast IMPRESSION:  Left upper pole heterogeneous ill-defined lesions spanning over 3.9  x 4.2 x 3.2 cm. The appearance is consistent with renal abscesses  given the provided history. Periaortic adenopathy.  Fluid within the pelvis. This may be related to recent ovulation  as there is a corpus luteum cyst anterior left aspect of the  pelvis. Because of the fluid, lack of fat planes and lack of  contrast within bowel, it is difficult to exclude primary bowel  inflammatory process including appendicitis.   Plan:  31 yo female with T1DM (A1C: 9.8), HTN, HLD who presented with abdominal pain and vomiting.  - Initially admitted by [facility] and now transferred to our care - We appreciate their excellent care.   Abdominal pain - Likely due to renal abscess as evidenced by CT - IR order placed and contacted, they are aware.  - CT  cannot r/o appendicitis, General Surgery contacted by Dr. Arbutus Leas of [facility] and will review films. - WBC 23.9 on 11/14 to 17.9 today - Vitals largely within normal limits, some HTN and no consistent tachycardia/tachypnea - treated for pyelonephritis with zosyn, day 2 now - evaluated by gynecology- appreciate recommendations.   - PID thought to be less likely  - Treated for BV with flagyl (IV currently with nausea)  - Urine GC/Chl pending  - Wet Prep negative  - dc flagyl and doxycycline -  pain control with: tylenol and vicoden Q4 - Urology consulted 11/14- recommend Perc drainage of abscess by IR, following, recommendations appreciated.   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  - Monitor, continue fluids  Hyponatremia - mild at 132, chronically borderline low with baseline around 135-140 - with low Cl likely from repeated emesis - continue NS at 100 ml.hr.  - monitor with daily BMP  HTN - Elevated overnight with readings in 150s/80s - no antihypertensives OP - monitor for now, start ACEi if still elevated after pain resolved   HLD-  - Lipid panel with NL LDL, HDL, Trigs, and cholesterol on 07/02/11 - Elevated LDL/cholesterol previously - Consider statin after acute phase of illness vs defer to PCP.   DM2:  - A1C 9.8 - uncontrolled, CBGs 183-341 - currently on lantus 30 and novolog sliding scale - Add pre-meal per home regimen if she starts to tolerate PO's  FEN/GI: carb modified, IV NS at 100 per hour PPx: SCD Dispo: Home pending clinical improvement   Code Status: Full   Kevin Fenton, MD 12/15/2011, 9:25 AM

## 2011-12-15 NOTE — Progress Notes (Signed)
Patient ID: [name], female   DOB: [birth year], 31 y.o.   MRN: [id number] Request received for CT guided aspiration/possible drainage in pt with pyelonephritis and finding of left renal abscess(es) on recent CT. Imaging studies reviewed by Dr. Grace Isaac. Additional PMH as below. Exam: chest- CTA bilat; heart-RRR; abd- soft,+BS, tender left abd/flank regions  Filed Vitals:   12/14/11 1609 12/14/11 1800 12/14/11 2046 12/15/11 0622  BP:  152/79 156/92 157/83  Pulse:  87 88 72  Temp: 100.2 F (37.9 C) 99.6 F (37.6 C) 99.3 F (37.4 C) 98.6 F (37 C)  TempSrc: Oral Oral Oral Oral  Resp:  18 18 18   Weight:   145 lb 14.4 oz (66.18 kg)   SpO2:  94% 100% 92%   Past Medical History  Diagnosis Date  . Hyperlipidemia   . Hypertension   . Diabetes mellitus     A1c > 11. Patient does not check BG or take insulin regularly.  Dx at age 47.  . Tobacco abuse   . Incarceration     Was in jail for selling drugs - out Oct 2010.   Past Surgical History  Procedure Date  . Tubal ligation 2009   US Pelvis Complete  12/13/2011  *RADIOLOGY REPORT*  Clinical Data: Vaginal discharge.  TRANSABDOMINAL ULTRASOUND OF PELVIS  Technique:  Transabdominal ultrasound examination of the pelvis was performed including evaluation of the uterus, ovaries, adnexal regions, and pelvic cul-de-sac.  Comparison:  None.  Findings:  Uterus:  Uterus is of normal size and echotexture measuring 7.1 x 3.9 x 5.7 cm.  No focal lesions are present.  Endometrium: The endometrium is within normal limits at 7.4 mm.  Right ovary: The right ovary is of normal size and echotexture measuring 3.1 x 2.0 x 2.1 cm.  Left ovary: Left ovary is of normal size and echotexture measuring 3.1 x 2.4 x 3.2 cm.  Other Findings:  No free fluid  IMPRESSION: Normal study. No evidence of pelvic mass or other significant abnormality.   Original Report Authenticated By: Marin Roberts, M.D.    Ct Abdomen Pelvis W Contrast  12/14/2011  *RADIOLOGY REPORT*   Clinical Data: Pyelonephritis.  Abdominal pain and vomiting. Diabetic hypertensive patient with hyperlipidemia.  CT ABDOMEN AND PELVIS WITH CONTRAST  Technique:  Multidetector CT imaging of the abdomen and pelvis was performed following the standard protocol during bolus administration of intravenous contrast.  Contrast: 80mL OMNIPAQUE IOHEXOL 300 MG/ML  SOLN  Comparison: 12/13/2011 ultrasound.  09/19/2010 CT.  Findings: Left upper pole heterogeneous ill-defined lesions spanning over 3.9 x 4.2 x 3.2 cm.  The appearance is consistent with renal abscesses given the provided history (rather than malignancy).  Periaortic adenopathy.  No right renal pyelonephritis or abscess detected.  Fluid within the pelvis.  This may be related to recent ovulation as there is a corpus luteum cyst anterior left aspect of the pelvis.  Because of the fluid, lack of fat planes and lack of contrast within bowel, it is difficult to exclude primary bowel inflammatory process including appendicitis.  No free intraperitoneal air.  No worrisome focal liver, splenic, adrenal or or pancreatic lesion.  Post tubal ligation.  No worrisome bony destructive lesion.  IMPRESSION: Left upper pole heterogeneous ill-defined lesions spanning over 3.9 x 4.2 x 3.2 cm.  The appearance is consistent with renal abscesses given the provided history.  Periaortic adenopathy.  Fluid within the pelvis.  This may be related to recent ovulation as there is a corpus luteum cyst anterior left  aspect of the pelvis.  Because of the fluid, lack of fat planes and lack of contrast within bowel, it is difficult to exclude primary bowel inflammatory process including appendicitis.  This has been made a PRA call report utilizing dashboard call feature.   Original Report Authenticated By: Lacy Duverney, M.D.    US Renal  12/13/2011  *RADIOLOGY REPORT*  Clinical Data: Flank pain Question pyelonephritis.  Rule out renal stone.  RENAL/URINARY TRACT ULTRASOUND COMPLETE   Comparison:  CT of the abdomen pelvis 09/19/2010.  Findings:  Right Kidney:  No hydronephrosis.  Well-preserved cortex.  Normal size and parenchymal echotexture without focal abnormalities. The maximal length is 12.1 cm, within normal limits.  Left Kidney:  No hydronephrosis.  Well-preserved cortex.  Normal size and parenchymal echotexture without focal abnormalities. The maximal length is 12.9 cm, within normal limits.  Bladder:  Normal  IMPRESSION: Negative renal ultrasound.   Original Report Authenticated By: Marin Roberts, M.D.   Results for orders placed during the hospital encounter of 12/13/11  CBC WITH DIFFERENTIAL      Component Value Range   WBC 21.6 (*) 4.0 - 10.5 K/uL   RBC 4.34  3.87 - 5.11 MIL/uL   Hemoglobin 13.2  12.0 - 15.0 g/dL   HCT 78.2  95.6 - 21.3 %   MCV 89.2  78.0 - 100.0 fL   MCH 30.4  26.0 - 34.0 pg   MCHC 34.1  30.0 - 36.0 g/dL   RDW 08.6  57.8 - 46.9 %   Platelets 267  150 - 400 K/uL   Neutrophils Relative 86 (*) 43 - 77 %   Neutro Abs 18.7 (*) 1.7 - 7.7 K/uL   Lymphocytes Relative 5 (*) 12 - 46 %   Lymphs Abs 1.0  0.7 - 4.0 K/uL   Monocytes Relative 9  3 - 12 %   Monocytes Absolute 1.9 (*) 0.1 - 1.0 K/uL   Eosinophils Relative 0  0 - 5 %   Eosinophils Absolute 0.0  0.0 - 0.7 K/uL   Basophils Relative 0  0 - 1 %   Basophils Absolute 0.0  0.0 - 0.1 K/uL  BASIC METABOLIC PANEL      Component Value Range   Sodium 134 (*) 135 - 145 mEq/L   Potassium 4.3  3.5 - 5.1 mEq/L   Chloride 94 (*) 96 - 112 mEq/L   CO2 29  19 - 32 mEq/L   Glucose, Bld 297 (*) 70 - 99 mg/dL   BUN 11  6 - 23 mg/dL   Creatinine, Ser 6.29  0.50 - 1.10 mg/dL   Calcium 52.8  8.4 - 41.3 mg/dL   GFR calc non Af Amer 82 (*) >90 mL/min   GFR calc Af Amer >90  >90 mL/min  LIPASE, BLOOD      Component Value Range   Lipase 9 (*) 11 - 59 U/L  URINALYSIS, ROUTINE W REFLEX MICROSCOPIC      Component Value Range   Color, Urine YELLOW  YELLOW   APPearance HAZY (*) CLEAR   Specific Gravity,  Urine 1.035 (*) 1.005 - 1.030   pH 5.5  5.0 - 8.0   Glucose, UA >1000 (*) NEGATIVE mg/dL   Hgb urine dipstick MODERATE (*) NEGATIVE   Bilirubin Urine NEGATIVE  NEGATIVE   Ketones, ur 40 (*) NEGATIVE mg/dL   Protein, ur NEGATIVE  NEGATIVE mg/dL   Urobilinogen, UA 0.2  0.0 - 1.0 mg/dL   Nitrite NEGATIVE  NEGATIVE   Leukocytes, UA  SMALL (*) NEGATIVE  POCT PREGNANCY, URINE      Component Value Range   Preg Test, Ur NEGATIVE  NEGATIVE  URINE MICROSCOPIC-ADD ON      Component Value Range   Squamous Epithelial / LPF MANY (*) RARE   WBC, UA TOO NUMEROUS TO COUNT  <3 WBC/hpf   RBC / HPF 7-10  <3 RBC/hpf   Bacteria, UA MANY (*) RARE  CULTURE, BLOOD (ROUTINE X 2)      Component Value Range   Specimen Description BLOOD ARM RIGHT     Special Requests BOTTLES DRAWN AEROBIC AND ANAEROBIC 10CC     Culture  Setup Time 12/14/2011 04:14     Culture       Value:        BLOOD CULTURE RECEIVED NO GROWTH TO DATE CULTURE WILL BE HELD FOR 5 DAYS BEFORE ISSUING A FINAL NEGATIVE REPORT   Report Status PENDING    CULTURE, BLOOD (ROUTINE X 2)      Component Value Range   Specimen Description BLOOD HAND RIGHT     Special Requests BOTTLES DRAWN AEROBIC ONLY 10CC     Culture  Setup Time 12/14/2011 04:14     Culture       Value:        BLOOD CULTURE RECEIVED NO GROWTH TO DATE CULTURE WILL BE HELD FOR 5 DAYS BEFORE ISSUING A FINAL NEGATIVE REPORT   Report Status PENDING    WET PREP, GENITAL      Component Value Range   Yeast Wet Prep HPF POC NONE SEEN  NONE SEEN   Trich, Wet Prep NONE SEEN  NONE SEEN   Clue Cells Wet Prep HPF POC NONE SEEN  NONE SEEN   WBC, Wet Prep HPF POC NONE SEEN  NONE SEEN  BASIC METABOLIC PANEL      Component Value Range   Sodium 135  135 - 145 mEq/L   Potassium 4.4  3.5 - 5.1 mEq/L   Chloride 94 (*) 96 - 112 mEq/L   CO2 28  19 - 32 mEq/L   Glucose, Bld 292 (*) 70 - 99 mg/dL   BUN 14  6 - 23 mg/dL   Creatinine, Ser 4.09  0.50 - 1.10 mg/dL   Calcium 9.3  8.4 - 81.1 mg/dL   GFR  calc non Af Amer 85 (*) >90 mL/min   GFR calc Af Amer >90  >90 mL/min  CBC      Component Value Range   WBC 23.9 (*) 4.0 - 10.5 K/uL   RBC 3.99  3.87 - 5.11 MIL/uL   Hemoglobin 12.0  12.0 - 15.0 g/dL   HCT 91.4 (*) 78.2 - 95.6 %   MCV 89.2  78.0 - 100.0 fL   MCH 30.1  26.0 - 34.0 pg   MCHC 33.7  30.0 - 36.0 g/dL   RDW 21.3  08.6 - 57.8 %   Platelets 249  150 - 400 K/uL  MAGNESIUM      Component Value Range   Magnesium 1.9  1.5 - 2.5 mg/dL  TSH      Component Value Range   TSH 2.330  0.350 - 4.500 uIU/mL  HEMOGLOBIN A1C      Component Value Range   Hemoglobin A1C 9.8 (*) <5.7 %   Mean Plasma Glucose 235 (*) <117 mg/dL  GLUCOSE, CAPILLARY      Component Value Range   Glucose-Capillary 356 (*) 70 - 99 mg/dL  GLUCOSE, CAPILLARY  Component Value Range   Glucose-Capillary 341 (*) 70 - 99 mg/dL  GLUCOSE, CAPILLARY      Component Value Range   Glucose-Capillary 357 (*) 70 - 99 mg/dL  GLUCOSE, CAPILLARY      Component Value Range   Glucose-Capillary 206 (*) 70 - 99 mg/dL  CBC WITH DIFFERENTIAL      Component Value Range   WBC 17.9 (*) 4.0 - 10.5 K/uL   RBC 4.02  3.87 - 5.11 MIL/uL   Hemoglobin 11.9 (*) 12.0 - 15.0 g/dL   HCT 16.1 (*) 09.6 - 04.5 %   MCV 89.1  78.0 - 100.0 fL   MCH 29.6  26.0 - 34.0 pg   MCHC 33.2  30.0 - 36.0 g/dL   RDW 40.9  81.1 - 91.4 %   Platelets 259  150 - 400 K/uL   Neutrophils Relative 82 (*) 43 - 77 %   Neutro Abs 14.7 (*) 1.7 - 7.7 K/uL   Lymphocytes Relative 7 (*) 12 - 46 %   Lymphs Abs 1.2  0.7 - 4.0 K/uL   Monocytes Relative 11  3 - 12 %   Monocytes Absolute 2.0 (*) 0.1 - 1.0 K/uL   Eosinophils Relative 0  0 - 5 %   Eosinophils Absolute 0.0  0.0 - 0.7 K/uL   Basophils Relative 0  0 - 1 %   Basophils Absolute 0.0  0.0 - 0.1 K/uL  COMPREHENSIVE METABOLIC PANEL      Component Value Range   Sodium 132 (*) 135 - 145 mEq/L   Potassium 3.7  3.5 - 5.1 mEq/L   Chloride 93 (*) 96 - 112 mEq/L   CO2 25  19 - 32 mEq/L   Glucose, Bld 202 (*) 70  - 99 mg/dL   BUN 7  6 - 23 mg/dL   Creatinine, Ser 7.82  0.50 - 1.10 mg/dL   Calcium 9.0  8.4 - 95.6 mg/dL   Total Protein 7.9  6.0 - 8.3 g/dL   Albumin 3.4 (*) 3.5 - 5.2 g/dL   AST 20  0 - 37 U/L   ALT 38 (*) 0 - 35 U/L   Alkaline Phosphatase 124 (*) 39 - 117 U/L   Total Bilirubin 0.5  0.3 - 1.2 mg/dL   GFR calc non Af Amer >90  >90 mL/min   GFR calc Af Amer >90  >90 mL/min  GLUCOSE, CAPILLARY      Component Value Range   Glucose-Capillary 201 (*) 70 - 99 mg/dL  GLUCOSE, CAPILLARY      Component Value Range   Glucose-Capillary 183 (*) 70 - 99 mg/dL  GLUCOSE, CAPILLARY      Component Value Range   Glucose-Capillary 202 (*) 70 - 99 mg/dL   A/P: Pt with hx of left sided abdominal pain , elevated WBC, pyelonephritis and finding of left renal abscess(es) on recent CT. Plan is for CT guided aspiration/possible drainage of renal abscess today. Details/risks of procedure d/w pt/mother with their understanding and consent.

## 2011-12-16 LAB — URINE CULTURE

## 2011-12-16 LAB — BASIC METABOLIC PANEL
BUN: 11 mg/dL (ref 6–23)
Calcium: 9 mg/dL (ref 8.4–10.5)
GFR calc Af Amer: >90 mL/min (ref >90)
GFR calc non Af Amer: >90 mL/min (ref >90)
Glucose, Bld: 208 mg/dL — ABNORMAL HIGH (ref 70–99)
Potassium: 4.1 mEq/L (ref 3.5–5.1)
Sodium: 134 mEq/L — ABNORMAL LOW (ref 135–145)

## 2011-12-16 LAB — GLUCOSE, CAPILLARY
Glucose-Capillary: 54 mg/dL — ABNORMAL LOW (ref 70–99)
Glucose-Capillary: 146 mg/dL — ABNORMAL HIGH (ref 70–99)
Glucose-Capillary: 193 mg/dL — ABNORMAL HIGH (ref 70–99)
Glucose-Capillary: 221 mg/dL — ABNORMAL HIGH (ref 70–99)
Glucose-Capillary: 182 mg/dL — ABNORMAL HIGH (ref 70–99)

## 2011-12-16 LAB — CBC
MCH: 31.1 pg (ref 26.0–34.0)
MCHC: 34.8 g/dL (ref 30.0–36.0)
RDW: 14.1 % (ref 11.5–15.5)

## 2011-12-16 MED ORDER — OXYCODONE HCL 5 MG PO TABS
5.0000 mg | ORAL_TABLET | ORAL | Status: DC | PRN
  Administered 2011-12-17 – 2011-12-20 (×7): 5 mg via ORAL
  Filled 2011-12-16 (×8): qty 1

## 2011-12-16 MED ORDER — INSULIN GLARGINE 100 UNIT/ML ~~LOC~~ SOLN
10.0000 [IU] | Freq: Once | SUBCUTANEOUS | Status: AC
  Administered 2011-12-16: 10 [IU] via SUBCUTANEOUS

## 2011-12-16 MED ORDER — METOCLOPRAMIDE HCL 5 MG/ML IJ SOLN
10.0000 mg | Freq: Four times a day (QID) | INTRAMUSCULAR | Status: DC
  Administered 2011-12-16 (×2): 10 mg via INTRAVENOUS
  Filled 2011-12-16 (×5): qty 2

## 2011-12-16 MED ORDER — METOCLOPRAMIDE HCL 10 MG PO TABS
10.0000 mg | ORAL_TABLET | Freq: Four times a day (QID) | ORAL | Status: DC
  Filled 2011-12-16 (×6): qty 1

## 2011-12-16 MED ORDER — CEFEPIME HCL 1 G IJ SOLR
1.0000 g | Freq: Two times a day (BID) | INTRAMUSCULAR | Status: DC
  Administered 2011-12-17: 1 g via INTRAMUSCULAR
  Filled 2011-12-16 (×3): qty 1

## 2011-12-16 MED ORDER — MORPHINE SULFATE 2 MG/ML IJ SOLN
2.0000 mg | INTRAMUSCULAR | Status: DC | PRN
  Filled 2011-12-16: qty 1

## 2011-12-16 MED ORDER — PANTOPRAZOLE SODIUM 40 MG PO TBEC
40.0000 mg | DELAYED_RELEASE_TABLET | Freq: Every day | ORAL | Status: DC
  Administered 2011-12-16: 40 mg via ORAL
  Filled 2011-12-16: qty 1

## 2011-12-16 MED ORDER — PROMETHAZINE HCL 25 MG/ML IJ SOLN
25.0000 mg | Freq: Four times a day (QID) | INTRAMUSCULAR | Status: DC | PRN
  Administered 2011-12-17: 25 mg via INTRAMUSCULAR
  Filled 2011-12-16 (×2): qty 1

## 2011-12-16 MED ORDER — ONDANSETRON 4 MG PO TBDP
4.0000 mg | ORAL_TABLET | Freq: Four times a day (QID) | ORAL | Status: DC | PRN
  Filled 2011-12-16: qty 1

## 2011-12-16 NOTE — ED Provider Notes (Signed)
I was present consultation and examined the patient in question during their ED stay and agree with the resident's documentation and resident's medical plan.  Jones Skene, MD   Jones Skene, MD 12/16/11 1422

## 2011-12-16 NOTE — Progress Notes (Signed)
Family Medicine Teaching Service Daily Progress Note/Transfer Note Service Page: [phone]  Patient Assessment: 31 yo female with T1DM(A1C: 9.8), HTN, HLD who presented with abdominal pain and vomiting likely due to renal abcess.  Hospital Course She was initially admitted to [facility] service with 2-3 days of nausea vomiting and flank pain and subsequently started on IV rocephin to treat pyelonephritis. OB/Gyn was consulted to evaluate for the possibility of PID and eval vaginal discharge, who felt PID was unlikely. Antibiotics were escalated to zosyn as she was not improving.  After a renal abscess was found on CT general surgery was contacted who reviewed the CT and felt that urology was the most appropriate consult. Urology evaluated the renal abscess and suggested continued Abx coverage and percutaneous drainage.    Subjective:  Continues to have nausea and bilious emesis, more than 5 episodes since noon yesterday. She reports that sometimes she makes herself throw up so that nausea can feel better. Had soft BM yesterday. No diarrhea. No dysuria. Continues to have some left quadrant pain although improved. She threw up her oral pain pills yesterday and was given dilaudid this am.   Objective: Temp:  [98.7 F (37.1 C)-101.9 F (38.8 C)] 98.7 F (37.1 C) (11/16 0916) Pulse Rate:  [80-102] 88  (11/16 0916) Resp:  [12-29] 18  (11/16 0916) BP: (114-175)/(67-102) 157/99 mmHg (11/16 0916) SpO2:  [97 %-100 %] 100 % (11/16 0916) Weight:  [146 lb (66.225 kg)] 146 lb (66.225 kg) (11/15 2200) Exam:  Gen: NAD, alert and oriented x4 HEENT: NCAT CV: RRR, good S1/S2, 2/6 systolic flow murmur Resp: CTABL, no wheezes, non-labored Abd: Soft, tender to palpation along left upper and lower quadrants as well as suprapubic area, no rebound, no guarding, left CVA tendernss, drain site clean and intact Ext: No edema, 2+ DP pulses.  Neuro: Alert and oriented, No gross deficits   I have reviewed the  patient's medications, labs, imaging, and diagnostic testing.  Notable results are summarized below.  CBC BMET   Lab 12/15/11 0600 12/14/11 0515 12/13/11 1343  WBC 17.9* 23.9* 21.6*  HGB 11.9* 12.0 13.2  HCT 35.8* 35.6* 38.7  PLT 259 249 267    Lab 12/15/11 0600 12/14/11 0515 12/13/11 1343  NA 132* 135 134*  K 3.7 4.4 4.3  CL 93* 94* 94*  CO2 25 28 29   BUN 7 14 11   CREATININE 0.70 0.89 0.92  GLUCOSE 202* 292* 297*  CALCIUM 9.0 9.3 10.1     Imaging/Diagnostic Tests: Renal US 11/13 IMPRESSION:  Negative renal ultrasound.  Pelvic US 11/13 IMPRESSION:  Normal study. No evidence of pelvic mass or other significant  abnormality.  CT abdomen with contrast IMPRESSION:  Left upper pole heterogeneous ill-defined lesions spanning over 3.9  x 4.2 x 3.2 cm. The appearance is consistent with renal abscesses  given the provided history. Periaortic adenopathy.  Fluid within the pelvis. This may be related to recent ovulation  as there is a corpus luteum cyst anterior left aspect of the  pelvis. Because of the fluid, lack of fat planes and lack of  contrast within bowel, it is difficult to exclude primary bowel  inflammatory process including appendicitis.   Plan:  30 yo female with T1DM (A1C: 9.8), HTN, HLD who presented with abdominal pain and vomiting.   Abdominal pain: likely from renal abscess seen on CT. S/p IR per drain on 11/15. Pain mildly improved. Fever curve improving.  - CT cannot r/o appendicitis, General Surgery contacted by Dr. Arbutus Leas of  [facility] and will review films. - WBC improving yesterday. F/u this am WBC - Vital signs stable. monitor temp.  - treated for pyelonephritis with zosyn (start 11/14), day 3 - evaluated by gynecology- appreciate recommendations.   - PID thought to be less likely. Urine GC/Chl: negative  - Wet Prep negative   - pain control: vicodin but patient unable to tolerate po. Dilaudid while patient not able to tolerate po.  - appreciate urology  recs  Nausea and vomiting: persistent - Hemetemsis- improved from onset per patient and Hgb stable- probable mallory weiss tear. F/u am CBC - on zofran scheduled. Restart reglan - Consider gastric emptying study to confirm gastroparesis.  - Monitor, continue fluids  Hyponatremia - mild at 132, chronically borderline low with baseline around 135-140 - with low Cl likely from repeated emesis - continue NS at 100 ml.hr.  - f/u am BMP  HTN - Elevated overnight with readings in 150s/90s - not on antihypertensives as outpatient. Consider starting lisinopril for BP and renal protection in setting of DM, once pain resolved.   HLD-  - Lipid panel with NL LDL, HDL, Trigs, and cholesterol on 07/02/11 - Elevated LDL/cholesterol previously - Consider statin after acute phase of illness vs defer to PCP.   DM2: A1C 9.8. Not tolerating po. Episode of hypoglycemia overnight at 54, asymptomatic. lantus held - restart lantus 10u - ISS - monitor CBG's - consider switching to D51/2NS if persistently low CBG's without tolerating po  FEN/GI: carb modified, IV NS at 100 per hour PPx: SCD Dispo: Home pending clinical improvement   Code Status: Full   Marena Chancy, MD 12/16/2011, 11:12 AM

## 2011-12-16 NOTE — Progress Notes (Signed)
Family Medicine Teaching Service Brief Note: Checked in on patient to see how nausea and vomiting are doing. Patient continues to have emesis, it is now non bilious, mucous like. She feels like there is something stuck in the middle of her esophagus. She gets nauseous and throws up as soon as she sits up in bed. Has some mild left sided flank pain, but improved compared to when she first presented. On exam, tender to palpation along left lower and upper quadrant. Mild tenderness along left flank around perc drainage site. No rebound, no rigid abdomen, present bowel sounds Recommended that she eat a saltine cracker before getting out of bed to see if this helps. Continue zofran and reglan. Consider emptying study to assess for gastric emptying.  WBC trending down. Hemoglobin stable and no fever in greater than 24hrs. Continue to monitor.    Marena Chancy, PGY-2 [name] [phone]

## 2011-12-16 NOTE — ED Provider Notes (Signed)
Medical screening examination/treatment/procedure(s) were performed by non-physician practitioner and as supervising physician I was immediately available for consultation/collaboration.  Jones Skene, M.D.     Jones Skene, MD 12/16/11 1423

## 2011-12-16 NOTE — Progress Notes (Addendum)
FMT[facility] Attending Daily Note:  Renold Don MD  [phone] pager  Family Practice pager:  [phone] I have seen and examined this patient and have reviewed their chart. I have discussed this patient with the resident. I agree with the resident's findings, assessment and care plan.  Pain somewhat improved today.  Still with some nausea and vomiting, as well as back pain.  Exam:   Gen:  Lying in hospital bed. No acute distress. Heart:  RRR Lungs:  Clear Abdomen:  Minimally tender Left upper quadrant and epigastrium. Good bowel sounds throughout. Back: Tenderness over left CVA. I did remove her bandage. She's had no bleeding at the site of her drainage. There is no bruising here either.  Imp/Plan: 1.  perinephric abscess: -Continue Zosyn for treatment of abscess/pyelonephritis. - Do not think she has retroperitoneal bleed as cause of her back pain but rather this is just continued evidence of pyelonephritis/pain or abscess, however no labs for today -Checking CBC today.  #2 nausea and vomiting: -Nausea and vomiting. There is also been some question of possible gastroparesis on previous office visit/hospital notes. -To prevent any worsening of nausea, continue to control her blood sugars in house. -Antiemetics as needed. - Restarting and then Advance in diet when she can tolerate it.

## 2011-12-16 NOTE — Progress Notes (Signed)
Patient ID: [name], female   DOB: 1981/01/21, 31 y.o.   MRN: [id number]    Subjective: Pt s/p aspiration of perinephric abscess yesterday.  Urine culture positive for pansensitive E. Coli. On Zosyn.  She is feeling better compared to two days ago but still with flank pain and nausea.  Objective: Vital signs in last 24 hours: Temp:  [98.7 F (37.1 C)-101.9 F (38.8 C)] 98.7 F (37.1 C) (11/16 0916) Pulse Rate:  [80-99] 88  (11/16 0916) Resp:  [12-18] 18  (11/16 0916) BP: (114-175)/(67-102) 157/99 mmHg (11/16 0916) SpO2:  [97 %-100 %] 100 % (11/16 0916) Weight:  [66.225 kg (146 lb)] 66.225 kg (146 lb) (11/15 2200)  Intake/Output from previous day: 11/15 0701 - 11/16 0700 In: 1260 [P.O.:60; I.V.:1200] Out: -  Intake/Output this shift:    Physical Exam:  General: Alert and oriented Abdomen: Soft, ND, Moderate CVAT   Lab Results:  Basename 12/15/11 0600 12/14/11 0515 12/13/11 1343  HGB 11.9* 12.0 13.2  HCT 35.8* 35.6* 38.7   Lab Results  Component Value Date   WBC 17.9* 12/15/2011   HGB 11.9* 12/15/2011   HCT 35.8* 12/15/2011   MCV 89.1 12/15/2011   PLT 259 12/15/2011    BMET  Basename 12/15/11 0600 12/14/11 0515  NA 132* 135  K 3.7 4.4  CL 93* 94*  CO2 25 28  GLUCOSE 202* 292*  BUN 7 14  CREATININE 0.70 0.89  CALCIUM 9.0 9.3     Studies/Results: Ct Abdomen Pelvis W Contrast  12/14/2011  *RADIOLOGY REPORT*  Clinical Data: Pyelonephritis.  Abdominal pain and vomiting. Diabetic hypertensive patient with hyperlipidemia.  CT ABDOMEN AND PELVIS WITH CONTRAST  Technique:  Multidetector CT imaging of the abdomen and pelvis was performed following the standard protocol during bolus administration of intravenous contrast.  Contrast: 80mL OMNIPAQUE IOHEXOL 300 MG/ML  SOLN  Comparison: 12/13/2011 ultrasound.  09/19/2010 CT.  Findings: Left upper pole heterogeneous ill-defined lesions spanning over 3.9 x 4.2 x 3.2 cm.  The appearance is consistent with renal  abscesses given the provided history (rather than malignancy).  Periaortic adenopathy.  No right renal pyelonephritis or abscess detected.  Fluid within the pelvis.  This may be related to recent ovulation as there is a corpus luteum cyst anterior left aspect of the pelvis.  Because of the fluid, lack of fat planes and lack of contrast within bowel, it is difficult to exclude primary bowel inflammatory process including appendicitis.  No free intraperitoneal air.  No worrisome focal liver, splenic, adrenal or or pancreatic lesion.  Post tubal ligation.  No worrisome bony destructive lesion.  IMPRESSION: Left upper pole heterogeneous ill-defined lesions spanning over 3.9 x 4.2 x 3.2 cm.  The appearance is consistent with renal abscesses given the provided history.  Periaortic adenopathy.  Fluid within the pelvis.  This may be related to recent ovulation as there is a corpus luteum cyst anterior left aspect of the pelvis.  Because of the fluid, lack of fat planes and lack of contrast within bowel, it is difficult to exclude primary bowel inflammatory process including appendicitis.  This has been made a PRA call report utilizing dashboard call feature.   Original Report Authenticated By: Lacy Duverney, M.D.    Ct Aspiration  12/15/2011  *RADIOLOGY REPORT*  Indication: Pyelonephritis, concern for left-sided renal abscess  CT GUIDED LEFT RENAL FLUID COLLECTION ASPIRATION  Comparison: CT abdomen pelvis - 12/14/2011  Medications: Fentanyl 75 mcg IV; Versed 2 mg IV  Total Moderate Sedation time:  15 minutes  Contrast: None  Complications: None immediate  Technique / Findings:  Informed written consent was obtained from the patient after a discussion of the risks, benefits and alternatives to treatment. The patient was placed prone on the CT gantry and a pre procedural CT was performed re-demonstrating the known abscess/fluid collection within the ill-defined area of hypoattenuation involving the posterior superior aspect  of the left renal cortex, concerning for renal abscess.  The procedure was planned.   A timeout was performed prior to the initiation of the procedure.  The left flank prepped and draped in the usual sterile fashion. The overlying soft tissues were anesthetized with 1% lidocaine with epinephrine.  Appropriate trajectory was planned with the use of a 22 gauge spinal needle.  A 5-French multi side-hole Yueh sheath needle was advanced into the ill-defined area of hypoattenuation of the superior pole of the left kidney.  The Yueh sheath needle was retracted as forceful aspiration was performed but only yielded approximately 3 ml of blood tinged fluid.  As such, a drainage catheter was not placed. Samples were sent to the Laboratory as requested by the ordering clinical pain.  A dressing was placed.  The patient tolerated the procedure well without immediate post procedural complication.  Impression:  Successful CT guided aspiration of ill-defined area of hypoattenuation involving the posterior superior pole of the right kidney yielding approximately 3 mL of bloody fluid.  Samples were sent to the laboratory as requested by the ordering clinical team.   Original Report Authenticated By: Tacey Ruiz, MD     Assessment/Plan: - Clinically improving with WBC trending down and no fever in 24 hrs.  Continue IV antibiotics awaiting cultures from aspiration yesterday. Likely, offending agent is E. Coli consistent with urine culture.  She ultimately will need about 6 weeks of culture specific antibiotics once final cultures return.    LOS: 3 days   Leili Eskenazi,LES 12/16/2011, 12:30 PM

## 2011-12-17 ENCOUNTER — Inpatient Hospital Stay (HOSPITAL_COMMUNITY): Payer: Medicaid Other

## 2011-12-17 LAB — BASIC METABOLIC PANEL
BUN: 11 mg/dL (ref 6–23)
CO2: 21 mEq/L (ref 19–32)
Calcium: 9.1 mg/dL (ref 8.4–10.5)
Chloride: 100 mEq/L (ref 96–112)
Creatinine, Ser: 0.67 mg/dL (ref 0.50–1.10)
GFR calc non Af Amer: >90 mL/min (ref >90)
Glucose, Bld: 280 mg/dL — ABNORMAL HIGH (ref 70–99)
Glucose, Bld: 318 mg/dL — ABNORMAL HIGH (ref 70–99)
Potassium: 4.4 mEq/L (ref 3.5–5.1)
Potassium: 4.4 mEq/L (ref 3.5–5.1)
Sodium: 141 mEq/L (ref 135–145)

## 2011-12-17 LAB — CBC
Hemoglobin: 12.4 g/dL (ref 12.0–15.0)
MCH: 30 pg (ref 26.0–34.0)
MCHC: 33.4 g/dL (ref 30.0–36.0)
Platelets: 317 10*3/uL (ref 150–400)
RBC: 4.13 MIL/uL (ref 3.87–5.11)

## 2011-12-17 LAB — GLUCOSE, CAPILLARY

## 2011-12-17 MED ORDER — INSULIN ASPART 100 UNIT/ML ~~LOC~~ SOLN
5.0000 [IU] | Freq: Once | SUBCUTANEOUS | Status: AC
  Administered 2011-12-17: 5 [IU] via SUBCUTANEOUS

## 2011-12-17 MED ORDER — METOCLOPRAMIDE HCL 5 MG/ML IJ SOLN
10.0000 mg | Freq: Four times a day (QID) | INTRAMUSCULAR | Status: DC
  Administered 2011-12-17 – 2011-12-20 (×12): 10 mg via INTRAVENOUS
  Filled 2011-12-17 (×17): qty 2

## 2011-12-17 MED ORDER — BLISTEX EX OINT
TOPICAL_OINTMENT | CUTANEOUS | Status: DC | PRN
  Filled 2011-12-17 (×2): qty 10

## 2011-12-17 MED ORDER — SODIUM CHLORIDE 0.9 % IJ SOLN
10.0000 mL | INTRAMUSCULAR | Status: DC | PRN
  Administered 2011-12-17 – 2011-12-19 (×5): 10 mL

## 2011-12-17 MED ORDER — PIPERACILLIN-TAZOBACTAM 3.375 G IVPB 30 MIN
3.3750 g | Freq: Three times a day (TID) | INTRAVENOUS | Status: DC
  Administered 2011-12-17 (×2): 3.375 g via INTRAVENOUS
  Filled 2011-12-17 (×4): qty 50

## 2011-12-17 MED ORDER — MORPHINE SULFATE 2 MG/ML IJ SOLN
2.0000 mg | INTRAMUSCULAR | Status: DC | PRN
  Administered 2011-12-17 (×3): 2 mg via INTRAMUSCULAR
  Filled 2011-12-17 (×2): qty 1

## 2011-12-17 MED ORDER — SODIUM CHLORIDE 0.9 % IJ SOLN
10.0000 mL | Freq: Two times a day (BID) | INTRAMUSCULAR | Status: DC
  Administered 2011-12-17 – 2011-12-19 (×3): 10 mL

## 2011-12-17 MED ORDER — LABETALOL HCL 5 MG/ML IV SOLN
5.0000 mg | INTRAVENOUS | Status: DC | PRN
  Filled 2011-12-17 (×2): qty 4

## 2011-12-17 MED ORDER — PANTOPRAZOLE SODIUM 40 MG IV SOLR
40.0000 mg | INTRAVENOUS | Status: DC
  Administered 2011-12-17 – 2011-12-19 (×3): 40 mg via INTRAVENOUS
  Filled 2011-12-17 (×4): qty 40

## 2011-12-17 MED ORDER — INSULIN GLARGINE 100 UNIT/ML ~~LOC~~ SOLN
20.0000 [IU] | Freq: Every day | SUBCUTANEOUS | Status: DC
  Administered 2011-12-17 – 2011-12-19 (×3): 20 [IU] via SUBCUTANEOUS

## 2011-12-17 NOTE — Progress Notes (Signed)
Family Medicine Teaching Service Daily Progress Note/Transfer Note Service Page: [phone]  Patient Assessment: 31 yo female with T1DM(A1C: 9.8), HTN, HLD who presented with abdominal pain and vomiting likely due to renal abcess.  Hospital Course She was initially admitted to [facility] service with 2-3 days of nausea vomiting and flank pain and subsequently started on IV rocephin to treat pyelonephritis. OB/Gyn was consulted to evaluate for the possibility of PID and eval vaginal discharge, who felt PID was unlikely. Antibiotics were escalated to zosyn as she was not improving.  After a renal abscess was found on CT general surgery was contacted who reviewed the CT and felt that urology was the most appropriate consult. Urology evaluated the renal abscess and suggested continued Abx coverage and percutaneous drainage.    Subjective:  Overnight: IV infiltrated and IV team was unable to get further access. Antibiotics were switched from zosyn to cefepime IM and pain medications were switched to IM morphine and anti-emetics to phenergan IM and zofran ODT and reglan po.  Nausea mildly improved since yesterday. Patient able to keep beef both down this morning. Last episode of emesis was when she first woke up this am.  Still having left sided back pain but improved compared to when she first presented.   Objective: Temp:  [98.7 F (37.1 C)-99.7 F (37.6 C)] 98.7 F (37.1 C) (11/17 0450) Pulse Rate:  [88-112] 99  (11/17 0450) Resp:  [18-20] 18  (11/17 0450) BP: (154-166)/(96-101) 166/96 mmHg (11/17 0450) SpO2:  [97 %-100 %] 97 % (11/17 0450) Weight:  [139 lb 8 oz (63.277 kg)] 139 lb 8 oz (63.277 kg) (11/17 0450) Exam:  Gen: NAD, alert and oriented x4 HEENT: NCAT CV: RRR, good S1/S2, 2/6 systolic flow murmur Resp: CTABL, no wheezes, non-labored Abd: Soft, tender to palpation along left upper and lower quadrants as well as suprapubic area and epigastrium. no rebound, no guarding, CVA tenderness  present on left side, no tenderness along drainage site, no erythema or bleeding.  Ext: No edema, 2+ DP pulses.  Neuro: Alert and oriented, No gross deficits   I have reviewed the patient's medications, labs, imaging, and diagnostic testing.  Notable results are summarized below.  CBC BMET   Lab 12/17/11 0620 12/16/11 1640 12/15/11 0600  WBC 15.6* 15.7* 17.9*  HGB 12.4 14.1 11.9*  HCT 37.1 40.5 35.8*  PLT 317 279 259    Lab 12/17/11 0620 12/16/11 1640 12/15/11 0600  NA 141 134* 132*  K 4.4 4.1 3.7  CL 101 96 93*  CO2 18* 22 25  BUN 12 11 7   CREATININE 0.64 0.74 0.70  GLUCOSE 280* 208* 202*  CALCIUM 9.1 9.0 9.0     Imaging/Diagnostic Tests: Renal US 11/13 IMPRESSION:  Negative renal ultrasound.  Pelvic US 11/13 IMPRESSION:  Normal study. No evidence of pelvic mass or other significant  abnormality.  CT abdomen with contrast IMPRESSION:  Left upper pole heterogeneous ill-defined lesions spanning over 3.9  x 4.2 x 3.2 cm. The appearance is consistent with renal abscesses  given the provided history. Periaortic adenopathy.  Fluid within the pelvis. This may be related to recent ovulation  as there is a corpus luteum cyst anterior left aspect of the  pelvis. Because of the fluid, lack of fat planes and lack of  contrast within bowel, it is difficult to exclude primary bowel  inflammatory process including appendicitis.   Plan:  31 yo female with T1DM (A1C: 9.8), HTN, HLD who presented with abdominal pain and vomiting.  Diabetes type 1: BMP this morning showing lower bicarb and AG:22 concerning for DKA, which could be explaining some of her nausea. Received lantus 20 units yesterday and had missed dose of lantus night before due to concern for hypoglycemia, which likely set patient back. On lantus 30units at home.  - repeat BMP now. q4 BMP afterwards. If worsening BMP, start drip.  - no current IV access. Will need to get PICC line.    Abdominal pain and renal  abscess:  S/p IR per drain on 11/15. Pain improving although still requiring IV pain meds. Afebrile for 36 hours. WBC stable. Hemoglobin in baseline range (concern for bleeding from drain procedure)  - treated for pyelonephritis with zosyn (start 11/14) which was interrupted due to IV loss. Currently on cefepime IM. Once IV access back, resume zosyn. IV antibiotics until final abscess culture returns. No growth to date. Urine culture pan-sensitive E-coli.   - pain control: IM morphine until IV access re-established.   Nausea and vomiting: mildly improved since yesterday. Able to tolerate broth. Likely from gastroparesis.  - Hematemesis-resolved, thought to be from [employer] tear.   - continue zofran, reglan and phenergan.  - restart fluids once IV access is re-established.   Hyponatremia - normalized today. - continue NS at 100 ml.hr.  - f/u am BMP  HTN - Elevated  - not on antihypertensives as outpatient. Consider starting lisinopril for BP and renal protection in setting of DM, if BP persists once pain resolves  HLD-  - Lipid panel with NL LDL, HDL, Trigs, and cholesterol on 07/02/11 - Elevated LDL/cholesterol previously - defer to PCP for start of statin  FEN/GI: full liquids, start IV fluids back once picc line PPx: SCD Dispo: Home pending clinical improvement   Code Status: Full   Marena Chancy, MD 12/17/2011, 9:14 AM

## 2011-12-17 NOTE — Progress Notes (Signed)
FMTS Attending Daily Note:  Renold Don MD  [phone] pager  Family Practice pager:  [phone] I have seen and examined this patient and have reviewed their chart. I have discussed this patient with the resident. I agree with the resident's findings, assessment and care plan.  Additionally: - Now with anion gap.  Missed dose of Lantus yesterday.   - She is going to receive a PICC line for IV access as IV team could otherwise not gain access. - Checking BMET now. She may need to be placed on insulin drip pending AG calculation.  - Continue IV antibiotics until she wound culture returns/she can tolerate PO medications.   - Pansensitive E coli is likely source.  Can narrow antibiotics if wound culture remains negative.   - Assume that her hypertension will resolve or at least improve once her pain and emesis are back under control

## 2011-12-17 NOTE — Progress Notes (Signed)
Peripherally Inserted Central Catheter/Midline Placement  The IV Nurse has discussed with the patient and/or persons authorized to consent for the patient, the purpose of this procedure and the potential benefits and risks involved with this procedure.  The benefits include less needle sticks, lab draws from the catheter and patient may be discharged home with the catheter.  Risks include, but not limited to, infection, bleeding, blood clot (thrombus formation), and puncture of an artery; nerve damage and irregular heat beat.  Alternatives to this procedure were also discussed.  PICC/Midline Placement Documentation  PICC / Midline Double Lumen 12/17/11 PICC Right Basilic (Active)  Indication for Insertion or Continuance of Line Limited venous access - need for IV therapy >5 days (PICC only) 12/17/2011  1:00 PM  Length mark (cm) 45 cm 12/17/2011  1:00 PM  Site Assessment Clean;Dry;Intact 12/17/2011  1:00 PM  Lumen #1 Status Flushed 12/17/2011  1:00 PM  Lumen #2 Status Flushed 12/17/2011  1:00 PM  Dressing Type Transparent 12/17/2011  1:00 PM  Dressing Status Clean;Dry;Intact;Antimicrobial disc in place 12/17/2011  1:00 PM  Line Care Line pulled back 12/17/2011  1:00 PM  Dressing Intervention Dressing changed 12/17/2011  1:00 PM  Dressing Change Due 12/24/11 12/17/2011  1:00 PM       Ethelda Chick 12/17/2011, 1:25 PM

## 2011-12-17 NOTE — Progress Notes (Signed)
ANTIBIOTIC CONSULT NOTE - INITIAL  Pharmacy Consult for Zosyn Indication:  Perinephric Abscess  No Known Allergies  Patient Measurements: Weight: 139 lb 8 oz (63.277 kg)  Vital Signs: Temp: 98.6 F (37 C) (11/17 1107) Temp src: Oral (11/17 1107) BP: 167/113 mmHg (11/17 1107) Pulse Rate: 94  (11/17 1107) Intake/Output from previous day: 11/16 0701 - 11/17 0700 In: 517.5 [P.O.:180; I.V.:337.5] Out: -   Labs:  Basename 12/17/11 0620 12/16/11 1640 12/15/11 0600  WBC 15.6* 15.7* 17.9*  HGB 12.4 14.1 11.9*  PLT 317 279 259  LABCREA -- -- --  CREATININE 0.64 0.74 0.70   Microbiology: Recent Results (from the past 720 hour(s))  URINE CULTURE     Status: Normal   Collection Time   12/13/11  4:56 PM      Component Value Range Status Comment   Specimen Description URINE, CLEAN CATCH   Final    Special Requests NONE   Final    Culture  Setup Time 12/13/2011 18:56   Final    Colony Count >=100,000 COLONIES/ML   Final    Culture ESCHERICHIA COLI   Final    Report Status 12/16/2011 FINAL   Final    Organism ID, Bacteria ESCHERICHIA COLI   Final   CULTURE, BLOOD (ROUTINE X 2)     Status: Normal (Preliminary result)   Collection Time   12/13/11  7:15 PM      Component Value Range Status Comment   Specimen Description BLOOD ARM RIGHT   Final    Special Requests BOTTLES DRAWN AEROBIC AND ANAEROBIC 10CC   Final    Culture  Setup Time 12/14/2011 04:14   Final    Culture     Final    Value:        BLOOD CULTURE RECEIVED NO GROWTH TO DATE CULTURE WILL BE HELD FOR 5 DAYS BEFORE ISSUING A FINAL NEGATIVE REPORT   Report Status PENDING   Incomplete   CULTURE, BLOOD (ROUTINE X 2)     Status: Normal (Preliminary result)   Collection Time   12/13/11  7:20 PM      Component Value Range Status Comment   Specimen Description BLOOD HAND RIGHT   Final    Special Requests BOTTLES DRAWN AEROBIC ONLY 10CC   Final    Culture  Setup Time 12/14/2011 04:14   Final    Culture     Final    Value:         BLOOD CULTURE RECEIVED NO GROWTH TO DATE CULTURE WILL BE HELD FOR 5 DAYS BEFORE ISSUING A FINAL NEGATIVE REPORT   Report Status PENDING   Incomplete   WET PREP, GENITAL     Status: Normal   Collection Time   12/14/11 11:28 AM      Component Value Range Status Comment   Yeast Wet Prep HPF POC NONE SEEN  NONE SEEN Final    Trich, Wet Prep NONE SEEN  NONE SEEN Final    Clue Cells Wet Prep HPF POC NONE SEEN  NONE SEEN Final    WBC, Wet Prep HPF POC NONE SEEN  NONE SEEN Final   GC/CHLAMYDIA PROBE AMP     Status: Normal   Collection Time   12/14/11  3:19 PM      Component Value Range Status Comment   CT Probe RNA NEGATIVE  NEGATIVE Final    GC Probe RNA NEGATIVE  NEGATIVE Final   CULTURE, ROUTINE-ABSCESS     Status: Normal (Preliminary result)  Collection Time   12/15/11 12:16 PM      Component Value Range Status Comment   Specimen Description ABSCESS   Final    Special Requests KIDNEY LEFT   Final    Gram Stain     Final    Value: ABUNDANT WBC PRESENT, PREDOMINANTLY PMN     NO SQUAMOUS EPITHELIAL CELLS SEEN     NO ORGANISMS SEEN   Culture NO GROWTH 2 DAYS   Final    Report Status PENDING   Incomplete     Medical History: Past Medical History  Diagnosis Date  . Hyperlipidemia   . Hypertension   . Diabetes mellitus     A1c > 11. Patient does not check BG or take insulin regularly.  Dx at age 62.  . Tobacco abuse   . Incarceration     Was in jail for selling drugs - out Oct 2010.    Medications:  Anti-infectives     Start     Dose/Rate Route Frequency Ordered Stop   12/17/11 1400  piperacillin-tazobactam (ZOSYN) IVPB 3.375 g       3.375 g 100 mL/hr over 30 Minutes Intravenous 3 times per day 12/17/11 1246     12/16/11 2345   ceFEPIme (MAXIPIME) injection 1 g  Status:  Discontinued        1 g Intramuscular Every 12 hours 12/16/11 2332 12/17/11 1135   12/15/11 1600   piperacillin-tazobactam (ZOSYN) IVPB 3.375 g  Status:  Discontinued        3.375 g 12.5 mL/hr over 240  Minutes Intravenous 3 times per day 12/15/11 0952 12/16/11 2332   12/15/11 0800   doxycycline (VIBRAMYCIN) 100 mg in dextrose 5 % 250 mL IVPB  Status:  Discontinued        100 mg 125 mL/hr over 120 Minutes Intravenous Every 12 hours 12/15/11 0435 12/15/11 1137   12/14/11 1900   cefTRIAXone (ROCEPHIN) 1 g in dextrose 5 % 50 mL IVPB  Status:  Discontinued        1 g 100 mL/hr over 30 Minutes Intravenous Every 24 hours 12/13/11 2103 12/14/11 1456   12/14/11 1600   piperacillin-tazobactam (ZOSYN) IVPB 3.375 g  Status:  Discontinued        3.375 g 100 mL/hr over 30 Minutes Intravenous Every 8 hours 12/14/11 1456 12/15/11 0953   12/14/11 1600   doxycycline (VIBRAMYCIN) 100 mg in dextrose 5 % 250 mL IVPB  Status:  Discontinued        100 mg 125 mL/hr over 120 Minutes Intravenous Every 12 hours 12/14/11 1458 12/15/11 0435   12/14/11 1600   metroNIDAZOLE (FLAGYL) IVPB 500 mg  Status:  Discontinued        500 mg 100 mL/hr over 60 Minutes Intravenous 3 times daily 12/14/11 1528 12/15/11 1137   12/13/11 2200   metroNIDAZOLE (FLAGYL) tablet 500 mg  Status:  Discontinued        500 mg Oral 2 times daily 12/13/11 2103 12/14/11 1528   12/13/11 1915   azithromycin (ZITHROMAX) 500 mg in dextrose 5 % 250 mL IVPB        500 mg 250 mL/hr over 60 Minutes Intravenous  Once 12/13/11 1913 12/13/11 2125   12/13/11 1845   cefTRIAXone (ROCEPHIN) 1 g in dextrose 5 % 50 mL IVPB  Status:  Discontinued        1 g 100 mL/hr over 30 Minutes Intravenous  Once 12/13/11 1837 12/13/11 2025  Assessment: 31 yo female admitted with bilateral flank pain, dysuria and increased vaginal discharge.  She was found to have [facility] UTI and Perinephric Abscess for which she has had percutaneous drainage.  She has had multiple antibiotics including Azithromycin, Ceftriaxone, Metronidazole, Cefepime, Doxycycline and Zosyn.  Her antibiotics have now been streamlined to cover the [facility] with IV Zosyn.  She has normal renal  function with a creatinine of 0.64 and an estimated clearance > 90 ml/min.  Her WBC remains elevated at 15.6K but down from 23.9K.  Goal of Therapy:  Therapeutic response to IV antibiotics and clearance of infections.  Plan:  Continue 4 hour infusions of Zosyn 3.375gm. every eight hours.  Monitor renal function and adjust as needed.  Nadara Mustard, PharmD., MS Clinical Pharmacist Pager:  [phone] Thank you for allowing pharmacy to be part of this patients care team. 12/17/2011,12:47 PM

## 2011-12-18 LAB — GLUCOSE, CAPILLARY
Glucose-Capillary: 214 mg/dL — ABNORMAL HIGH (ref 70–99)
Glucose-Capillary: 136 mg/dL — ABNORMAL HIGH (ref 70–99)
Glucose-Capillary: 164 mg/dL — ABNORMAL HIGH (ref 70–99)

## 2011-12-18 LAB — BASIC METABOLIC PANEL
BUN: 7 mg/dL (ref 6–23)
BUN: 6 mg/dL (ref 6–23)
BUN: 4 mg/dL — ABNORMAL LOW (ref 6–23)
CO2: 28 mEq/L (ref 19–32)
CO2: 24 mEq/L (ref 19–32)
CO2: 28 mEq/L (ref 19–32)
CO2: 27 mEq/L (ref 19–32)
Calcium: 8.5 mg/dL (ref 8.4–10.5)
Calcium: 8.5 mg/dL (ref 8.4–10.5)
Chloride: 102 mEq/L (ref 96–112)
Chloride: 105 mEq/L (ref 96–112)
Creatinine, Ser: 0.72 mg/dL (ref 0.50–1.10)
Glucose, Bld: 210 mg/dL — ABNORMAL HIGH (ref 70–99)
Glucose, Bld: 220 mg/dL — ABNORMAL HIGH (ref 70–99)
Glucose, Bld: 161 mg/dL — ABNORMAL HIGH (ref 70–99)
Glucose, Bld: 182 mg/dL — ABNORMAL HIGH (ref 70–99)
Potassium: 3.1 mEq/L — ABNORMAL LOW (ref 3.5–5.1)
Sodium: 142 mEq/L (ref 135–145)
Sodium: 143 mEq/L (ref 135–145)
Sodium: 141 mEq/L (ref 135–145)

## 2011-12-18 LAB — CBC
HCT: 33.1 % — ABNORMAL LOW (ref 36.0–46.0)
MCH: 29.5 pg (ref 26.0–34.0)
MCV: 88.7 fL (ref 78.0–100.0)
RBC: 3.73 MIL/uL — ABNORMAL LOW (ref 3.87–5.11)
RDW: 14.4 % (ref 11.5–15.5)
WBC: 12.7 10*3/uL — ABNORMAL HIGH (ref 4.0–10.5)

## 2011-12-18 MED ORDER — ONDANSETRON HCL 4 MG/2ML IJ SOLN
INTRAMUSCULAR | Status: AC
  Filled 2011-12-18: qty 2

## 2011-12-18 MED ORDER — PIPERACILLIN-TAZOBACTAM 3.375 G IVPB 30 MIN
3.3750 g | Freq: Three times a day (TID) | INTRAVENOUS | Status: DC
  Administered 2011-12-18 – 2011-12-20 (×7): 3.375 g via INTRAVENOUS
  Filled 2011-12-18 (×9): qty 50

## 2011-12-18 MED ORDER — MORPHINE SULFATE 2 MG/ML IJ SOLN
2.0000 mg | INTRAMUSCULAR | Status: DC | PRN
  Administered 2011-12-18 – 2011-12-19 (×4): 2 mg via INTRAVENOUS
  Filled 2011-12-18 (×4): qty 1

## 2011-12-18 NOTE — Progress Notes (Signed)
Inpatient Diabetes Program Recommendations  AACE/ADA: New Consensus Statement on Inpatient Glycemic Control (2013)  Target Ranges:  Prepandial:   less than 140 mg/dL      Peak postprandial:   less than 180 mg/dL (1-2 hours)      Critically ill patients:  140 - 180 mg/dL    Results for [name], [name] (MRN [id number]) as of 12/18/2011 13:42  Ref. Range 12/17/2011 07:43 12/17/2011 12:02 12/17/2011 16:44 12/17/2011 20:55  Glucose-Capillary Latest Range: 70-99 mg/dL 409 (H) 811 (H) 914 (H) 253 (H)   Results for [name], [name] (MRN [id number]) as of 12/18/2011 13:42  Ref. Range 12/18/2011 08:05 12/18/2011 11:59  Glucose-Capillary Latest Range: 70-99 mg/dL 086 (H) 578 (H)   Fasting sugars elevated.  Patient continues to have N&V and poor PO intake.  Recommend the following to help improve CBGs:  Inpatient Diabetes Program Recommendations Insulin - Basal: Please increase Lantus to 25 units QHS. Correction (SSI): Please change Novolog Moderate SSI to Q4 hour coverage if patient continues to have N&V and poor PO intake.  Note: Will follow. Ambrose Finland RN, MSN, CDE Diabetes Coordinator Inpatient Diabetes Program [phone]

## 2011-12-18 NOTE — Progress Notes (Signed)
Subjective: Patient reports Left flank pain but less.  Nausea and vomiting  Objective: Vital signs in last 24 hours: Temp:  [97.9 F (36.6 C)-99.4 F (37.4 C)] 99.4 F (37.4 C) (11/18 1646) Pulse Rate:  [85-98] 89  (11/18 1646) Resp:  [18-20] 18  (11/18 1646) BP: (156-180)/(92-110) 170/95 mmHg (11/18 1646) SpO2:  [98 %-100 %] 100 % (11/18 1646) Weight:  [138 lb 11.2 oz (62.914 kg)] 138 lb 11.2 oz (62.914 kg) (11/17 2100)  Intake/Output from previous day: 11/17 0701 - 11/18 0700 In: 1548 [P.O.:240; I.V.:1200; IV Piggyback:108] Out: -  Intake/Output this shift:  WBC: 12.7 Urine culture: >100.000 colonies E.coli pansensitive. Culture aspiration fluid left kidney: no growth.   .   Lab Results:  Basename 12/18/11 0500 12/17/11 0620 12/16/11 1640  HGB 11.0* 12.4 14.1  HCT 33.1* 37.1 40.5   BMET  Basename 12/18/11 1421 12/18/11 0500  NA 143 142  K 3.5 3.9  CL 105 102  CO2 28 24  GLUCOSE 161* 220*  BUN 4* 6  CREATININE 0.63 0.66  CALCIUM 8.6 8.5   No results found for this basename: LABPT:3,INR:3 in the last 72 hours No results found for this basename: LABURIN:1 in the last 72 hours Results for orders placed during the hospital encounter of 12/13/11  URINE CULTURE     Status: Normal   Collection Time   12/13/11  4:56 PM      Component Value Range Status Comment   Specimen Description URINE, CLEAN CATCH   Final    Special Requests NONE   Final    Culture  Setup Time 12/13/2011 18:56   Final    Colony Count >=100,000 COLONIES/ML   Final    Culture ESCHERICHIA COLI   Final    Report Status 12/16/2011 FINAL   Final    Organism ID, Bacteria ESCHERICHIA COLI   Final   CULTURE, BLOOD (ROUTINE X 2)     Status: Normal (Preliminary result)   Collection Time   12/13/11  7:15 PM      Component Value Range Status Comment   Specimen Description BLOOD ARM RIGHT   Final    Special Requests BOTTLES DRAWN AEROBIC AND ANAEROBIC 10CC   Final    Culture  Setup Time 12/14/2011  04:14   Final    Culture     Final    Value:        BLOOD CULTURE RECEIVED NO GROWTH TO DATE CULTURE WILL BE HELD FOR 5 DAYS BEFORE ISSUING A FINAL NEGATIVE REPORT   Report Status PENDING   Incomplete   CULTURE, BLOOD (ROUTINE X 2)     Status: Normal (Preliminary result)   Collection Time   12/13/11  7:20 PM      Component Value Range Status Comment   Specimen Description BLOOD HAND RIGHT   Final    Special Requests BOTTLES DRAWN AEROBIC ONLY 10CC   Final    Culture  Setup Time 12/14/2011 04:14   Final    Culture     Final    Value:        BLOOD CULTURE RECEIVED NO GROWTH TO DATE CULTURE WILL BE HELD FOR 5 DAYS BEFORE ISSUING A FINAL NEGATIVE REPORT   Report Status PENDING   Incomplete   WET PREP, GENITAL     Status: Normal   Collection Time   12/14/11 11:28 AM      Component Value Range Status Comment   Yeast Wet Prep HPF POC NONE SEEN  NONE  SEEN Final    Trich, Wet Prep NONE SEEN  NONE SEEN Final    Clue Cells Wet Prep HPF POC NONE SEEN  NONE SEEN Final    WBC, Wet Prep HPF POC NONE SEEN  NONE SEEN Final   GC/CHLAMYDIA PROBE AMP     Status: Normal   Collection Time   12/14/11  3:19 PM      Component Value Range Status Comment   CT Probe RNA NEGATIVE  NEGATIVE Final    GC Probe RNA NEGATIVE  NEGATIVE Final   CULTURE, ROUTINE-ABSCESS     Status: Normal   Collection Time   12/15/11 12:16 PM      Component Value Range Status Comment   Specimen Description ABSCESS   Final    Special Requests KIDNEY LEFT   Final    Gram Stain     Final    Value: ABUNDANT WBC PRESENT, PREDOMINANTLY PMN     NO SQUAMOUS EPITHELIAL CELLS SEEN     NO ORGANISMS SEEN   Culture NO GROWTH 3 DAYS   Final    Report Status 12/18/2011 FINAL   Final     Studies/Results: Dg Chest Port 1 View  12/17/2011  *RADIOLOGY REPORT*  Clinical Data: Confirm line placement.  PORTABLE CHEST - 1 VIEW  Comparison: Chest x-ray 04/29/2010.  Findings: There is a right upper extremity PICC with tip terminating in the right  atrium. Lung volumes are normal.  No consolidative airspace disease.  No pleural effusions.  No pneumothorax.  No pulmonary nodule or mass noted.  Pulmonary vasculature and the cardiomediastinal silhouette are within normal limits.  IMPRESSION: 1. No radiographic evidence of acute cardiopulmonary disease. 2.  Tip of PICC is in the right atrium.   Original Report Authenticated By: Trudie Reed, M.D.     Assessment/Plan:  Left pyelonephritis. ? Left renal abscess.  Continue IV antibiotics.  Consider repeat CT scan if patient continues to have flank pain, nausea and vomiting.   LOS: 5 days   [name] 12/18/2011, 6:29 PM

## 2011-12-18 NOTE — Progress Notes (Signed)
Family Medicine Teaching Service Daily Progress Note/Transfer Note Service Page: [phone]  Patient Assessment: 31 yo female with T1DM(A1C: 9.8), HTN, HLD who presented with abdominal pain and vomiting likely due to renal abcess.  Subjective:  Picc line placed yesterday. Still having nausea and vomiting, described as bilious (non-bloody) but not every single episode now. Pain continued but not as bad as before.   Objective: Temp:  [97.9 F (36.6 C)-98.7 F (37.1 C)] 97.9 F (36.6 C) (11/18 0439) Pulse Rate:  [94-102] 96  (11/18 0439) Resp:  [18] 18  (11/18 0439) BP: (144-180)/(88-113) 172/96 mmHg (11/18 0439) SpO2:  [95 %-100 %] 98 % (11/18 0439) Weight:  [138 lb 11.2 oz (62.914 kg)] 138 lb 11.2 oz (62.914 kg) (11/17 2100) Exam:  Gen: NAD, alert and oriented x4 HEENT: NCAT CV: RRR, good S1/S2, 2/6 systolic flow murmur Resp: CTABL, no wheezes, non-labored Abd: Soft, tender to palpation along LUQ and LLQ. no rebound, no guarding, CVA tenderness BL, no erythema or bleeding.  Ext: No edema, 2+ DP pulses.  Neuro: Alert and oriented, No gross deficits   I have reviewed the patient's medications, labs, imaging, and diagnostic testing.  Notable results are summarized below.  CBC BMET   Lab 12/18/11 0500 12/17/11 0620 12/16/11 1640  WBC 12.7* 15.6* 15.7*  HGB 11.0* 12.4 14.1  HCT 33.1* 37.1 40.5  PLT 307 317 279    Lab 12/18/11 0500 12/17/11 2330 12/17/11 1300  NA 142 141 142  K 3.9 3.9 4.4  CL 102 102 100  CO2 24 28 21   BUN 6 7 11   CREATININE 0.66 0.72 0.67  GLUCOSE 220* 210* 318*  CALCIUM 8.5 8.5 9.3     Imaging/Diagnostic Tests: Renal US 11/13 IMPRESSION:  Negative renal ultrasound.  Pelvic US 11/13 IMPRESSION:  Normal study. No evidence of pelvic mass or other significant  abnormality.  CT abdomen with contrast IMPRESSION:  Left upper pole heterogeneous ill-defined lesions spanning over 3.9  x 4.2 x 3.2 cm. The appearance is consistent with renal abscesses    given the provided history. Periaortic adenopathy.  Fluid within the pelvis. This may be related to recent ovulation  as there is a corpus luteum cyst anterior left aspect of the  pelvis. Because of the fluid, lack of fat planes and lack of  contrast within bowel, it is difficult to exclude primary bowel  inflammatory process including appendicitis.   Plan:  31 yo female with T1DM (A1C: 9.8), HTN, HLD who presented with abdominal pain and vomiting.   DM1:  - AG is 6 this am - Lantius 20 qd, 5 units regular TID and 3 at night - Space Q4 BMP - PICC line in place  Abdominal pain and renal abscess:   - S/p IR per drain on 11/15, pain improving although still requiring IV pain meds.  - Afebrile for 36 hours, WBC stable.  - Hemoglobin in baseline range (concern for bleeding from drain procedure) - treated for pyelonephritis with zosyn (start 11/14) which was interrupted due to IV loss on 11/17 was treated with IM cefepime in the interim - Urine culture pan-sensitive E-coli, abscess culture no growth  - narrow Abx if improves and no growth on abcess culture - pain control: IV morphine, added PO oxycodone also  Nausea and vomiting:  - continued. Not tolerating coffee this am. Likely from gastroparesis and infectious illness.   - Hematemesis-resolved, thought to be from [employer] tear.   - continue zofran, reglan and phenergan.  - IVF  NS with 20 KCl at 100 ml/hr  Hyponatremia - resolved - continue NS at 100 ml.hr.  - f/u am BMP  HTN - Elevated  - not on antihypertensives as outpatient. Consider starting lisinopril for BP and renal protection in setting of DM, if BP persists once pain resolves  HLD-  - Lipid panel with NL LDL, HDL, Trigs, and cholesterol on 07/02/11 - Elevated LDL/cholesterol previously - defer to PCP for start of statin  FEN/GI: full liquids, start IV fluids back once picc line PPx: SCD Dispo: Home pending clinical improvement   Code Status: Full    Kevin Fenton, MD 12/18/2011, 6:51 AM

## 2011-12-18 NOTE — Progress Notes (Signed)
Family Medicine Teaching Service Attending Note  I interviewed and examined patient [name] and reviewed their tests and x-rays.  I discussed with Dr. Ermalinda Memos and reviewed their note for today.  I agree with their assessment and plan.     Additionally  Alert still nauseous No back pain currently  Most consistent with pyleo with prolonged nausea or vomiting due to underlying gastroparesis.  However if not steadily improving may need to check CT of abdomen  Encourage ambulation and liquids

## 2011-12-19 LAB — BASIC METABOLIC PANEL
Chloride: 104 mEq/L (ref 96–112)
Chloride: 100 mEq/L (ref 96–112)
Creatinine, Ser: 0.71 mg/dL (ref 0.50–1.10)
Creatinine, Ser: 0.79 mg/dL (ref 0.50–1.10)
GFR calc Af Amer: >90 mL/min (ref >90)
GFR calc Af Amer: >90 mL/min (ref >90)
Potassium: 3.7 mEq/L (ref 3.5–5.1)
Potassium: 3.4 mEq/L — ABNORMAL LOW (ref 3.5–5.1)
Sodium: 143 mEq/L (ref 135–145)
Sodium: 138 mEq/L (ref 135–145)

## 2011-12-19 LAB — CBC
Platelets: 316 10*3/uL (ref 150–400)
RBC: 3.52 MIL/uL — ABNORMAL LOW (ref 3.87–5.11)
WBC: 11.6 10*3/uL — ABNORMAL HIGH (ref 4.0–10.5)

## 2011-12-19 LAB — GLUCOSE, CAPILLARY: Glucose-Capillary: 163 mg/dL — ABNORMAL HIGH (ref 70–99)

## 2011-12-19 MED ORDER — POTASSIUM CHLORIDE 10 MEQ/100ML IV SOLN
10.0000 meq | INTRAVENOUS | Status: AC
  Administered 2011-12-19 (×4): 10 meq via INTRAVENOUS
  Filled 2011-12-19 (×4): qty 100

## 2011-12-19 NOTE — Progress Notes (Signed)
Family Medicine Teaching Service                                                                 [facility] Progress Note     Patient name: [name] Medical record number: [id number] Date of birth: [birth year] Age: 31 y.o. Gender: female    LOS: 6 days   Primary Care Provider: Marena Chancy, MD  Overnight Events:  Per report, patient was having diffuse abdominal pain. Upon introduction of myself and assessment patient stated, "no more doctors in here after you two." Patient refused to be examined for pain or answer an questions. She told Dr. Tye Savoy and myself to "go read the chart." We explained we are the night team and we needed to assess her to make sure she was taken care of and in no distress. She still refused. This occurred around 10 pm.  Per nurse call, she declined her Zofran this evening. She then became nauseated and asked for it later.    Objective: Vital signs in last 24 hours: BP 149/88  Pulse 89  Temp 98.3 F (36.8 C) (Oral)  Resp 18  Wt 138 lb 11.2 oz (62.914 kg)  SpO2 100%  LMP 11/15/2011    Physical Exam: Gen: Argumentative. Agressive. Unpleasant.   Labs/Studies:  CMP     Component Value Date/Time   NA 141 12/18/2011 1851   K 3.1* 12/18/2011 1851   CL 100 12/18/2011 1851   CO2 27 12/18/2011 1851   GLUCOSE 182* 12/18/2011 1851   BUN 4* 12/18/2011 1851   CREATININE 0.70 12/18/2011 1851   CREATININE 0.81 09/30/2010 1219   CALCIUM 8.8 12/18/2011 1851   PROT 7.9 12/15/2011 0600   ALBUMIN 3.4* 12/15/2011 0600   AST 20 12/15/2011 0600   ALT 38* 12/15/2011 0600   ALKPHOS 124* 12/15/2011 0600   BILITOT 0.5 12/15/2011 0600   GFRNONAA >90 12/18/2011 1851   GFRAA >90 12/18/2011 1851   Medications: Scheduled Meds:   . insulin aspart  0-15 Units Subcutaneous TID WC  . insulin aspart  0-5 Units Subcutaneous QHS  . insulin glargine  20 Units Subcutaneous QHS  . magic mouthwash  5 mL Oral BID  . metoCLOPramide (REGLAN) injection  10 mg  Intravenous Q6H  . [EXPIRED] ondansetron      . ondansetron (ZOFRAN) IV  4 mg Intravenous Q4H  . pantoprazole (PROTONIX) IV  40 mg Intravenous Q24H  . piperacillin-tazobactam  3.375 g Intravenous Q8H  . potassium chloride  10 mEq Intravenous Q1 Hr x 4  . sodium chloride  10-40 mL Intracatheter Q12H  . [DISCONTINUED] piperacillin-tazobactam  3.375 g Intravenous Q8H   Continuous Infusions:   . 0.9 % NaCl with KCl 20 mEq / L 100 mL/hr at 12/18/11 1228   PRN Meds:.acetaminophen, albuterol, alum & mag hydroxide-simeth, dextrose, glucose-Vitamin C, guaiFENesin-dextromethorphan, labetalol, lip balm, morphine injection, ondansetron, oxyCODONE, promethazine, senna-docusate, sodium chloride    Assessment/Plan:  31 yo female with T1DM (A1C: 9.8), HTN, HLD who presented with abdominal pain and vomiting.  DM1:  - Lantius 20 qd, 5 units regular TID and 3 at night  - Space Q4 BMP  - PICC line in place  Abdominal pain and renal abscess:  - S/p IR per drain on 11/15, pain  improving although still requiring IV pain meds.  - Afebrile for 36 hours, WBC stable.  - Hemoglobin in baseline range (concern for bleeding from drain procedure)  - treated for pyelonephritis with zosyn (start 11/14) which was interrupted due to IV loss on 11/17 was treated with IM cefepime in the interim  - Urine culture pan-sensitive E-coli, abscess culture no growth  - narrow Abx if improves and no growth on abcess culture  - pain control: IV morphine, added PO oxycodone also  Nausea and vomiting:  - continued this evening - Hematemesis-resolved, thought to be from [employer] tear.  - continue zofran, reglan and phenergan - IVF NS with 20 KCl at 100 ml/hr  Hyponatremia  - resolved  - continue NS at 100 ml.hr.  - f/u am BMP  HTN  - Elevated  - not on antihypertensives as outpatient. Consider starting lisinopril for BP and renal protection in setting of DM, if BP persists once pain resolves  HLD-  - Lipid panel with  NL LDL, HDL, Trigs, and cholesterol on 07/02/11  - Elevated LDL/cholesterol previously  - defer to PCP for start of statin  Hypokalemia: - Patient refuses oral potassium supplement - KCl IVPB x 4   FEN/GI:  full liquids MIVF NaCl with 20 mEq KCl @100mL /hr  PPx: SCD  Dispo: Home pending clinical improvement  Code Status: Full     -

## 2011-12-19 NOTE — Progress Notes (Signed)
Family Medicine Teaching Service Daily Progress Note/Transfer Note Service Page: [phone]  Patient Assessment: 31 yo female with T1DM(A1C: 9.8), HTN, HLD who presented with abdominal pain and vomiting likely due to renal abcess.  Subjective:  Pain present but improved today and controlled on current meds. Still nauseous but meds helping now. 1 episode of emesis this am. Tolerated some applesauce and jello. Wants to eat some fruit at lunch.   Objective: Temp:  [98 F (36.7 C)-99.4 F (37.4 C)] 98.4 F (36.9 C) (11/19 0512) Pulse Rate:  [84-93] 84  (11/19 0512) Resp:  [18-20] 18  (11/19 0512) BP: (149-170)/(88-106) 162/96 mmHg (11/19 0512) SpO2:  [98 %-100 %] 98 % (11/19 0512) Weight:  [138 lb 11.2 oz (62.914 kg)] 138 lb 11.2 oz (62.914 kg) (11/18 2050) Exam:  Gen: NAD, alert and oriented x4 HEENT: NCAT CV: RRR, good S1/S2, 2/6 systolic flow murmur Resp: CTABL, no wheezes, non-labored Abd: Soft, tender to palpation along LUQ and LLQ. Some voluntary guarding, CVA tenderness on the right, would not let me check the left.  No erythema or bleeding from needle insertion site Ext: No edema, 2+ DP pulses.  Neuro: Alert and oriented, No gross deficits   I have reviewed the patient's medications, labs, imaging, and diagnostic testing.  Notable results are summarized below.  CBC BMET   Lab 12/19/11 0500 12/18/11 0500 12/17/11 0620  WBC 11.6* 12.7* 15.6*  HGB 10.6* 11.0* 12.4  HCT 31.5* 33.1* 37.1  PLT 316 307 317    Lab 12/19/11 0500 12/18/11 1851 12/18/11 1421  NA 143 141 143  K 3.7 3.1* 3.5  CL 104 100 105  CO2 28 27 28   BUN 4* 4* 4*  CREATININE 0.71 0.70 0.63  GLUCOSE 162* 182* 161*  CALCIUM 8.1* 8.8 8.6     Imaging/Diagnostic Tests: Renal US 11/13 IMPRESSION:  Negative renal ultrasound.  Pelvic US 11/13 IMPRESSION:  Normal study. No evidence of pelvic mass or other significant  abnormality.  CT abdomen with contrast IMPRESSION:  Left upper pole heterogeneous  ill-defined lesions spanning over 3.9  x 4.2 x 3.2 cm. The appearance is consistent with renal abscesses  given the provided history. Periaortic adenopathy.  Fluid within the pelvis. This may be related to recent ovulation  as there is a corpus luteum cyst anterior left aspect of the  pelvis. Because of the fluid, lack of fat planes and lack of  contrast within bowel, it is difficult to exclude primary bowel  inflammatory process including appendicitis.   Plan:  31 yo female with T1DM (A1C: 9.8), HTN, HLD who presented with abdominal pain and vomiting.   Abdominal pain and renal abscess:  Improving - S/p IR per drain on 11/15, pain improving although still requiring IV pain meds ( 3 doses of 2 mg morphine)  - Afebrile, WBC improving.  - Hemoglobin trending down, now at 10.6 from 12.0 at admission (concern for bleeding from drain procedure) - treated for pyelonephritis with zosyn (start 11/14) which was interrupted due to IV loss on 11/17 was treated with IM cefepime in the interim - Urine culture pan-sensitive E-coli, abscess culture no growth - Abscess culture NGTD- plan to narrow Abx to E coli when she improves clinically - pain control: changing to all PO's- oxycodone - Plan to re-image with CT abdomen today if coarse worsens  DM1:  - gap closed after tranisent increase ove rthe weekend - Lantius 20 qd, 5 units regular TID and 3 at night - Increase lantus to 25  since advancing diet - daily BMP - PICC line in place  Nausea and vomiting: improving - tolerating some fluids, applesauce, and jello.  - Likely from gastroparesis and infectious illness.   - continue zofran, reglan and phenergan.  - IVF NS with 20 KCl at 100 ml/hr  HTN - Elevated 150s to 170s/ 88-106 - not on antihypertensives as outpatient. Consider starting lisinopril for BP and renal protection in setting of DM, if BP persists once pain resolves  HLD - Lipid panel with NL LDL, HDL, Trigs, and cholesterol on  07/02/11 - Elevated LDL/cholesterol previously - defer to PCP for start of statin  FEN/GI: full liquids advance as tolerated, d/c fluids if tolerates diet PPx: SCD Dispo: Home pending clinical improvement   Code Status: Full   Kevin Fenton, MD 12/19/2011, 9:26 AM

## 2011-12-19 NOTE — Progress Notes (Signed)
Pt's K level 3.1 this pm.  NS with kcl infusing via right arm PICC.  MD notified of K level.  Will continue to monitor.

## 2011-12-19 NOTE — Progress Notes (Signed)
Family Medicine Teaching Service Attending Note  I interviewed and examined patient [name] and reviewed their tests and x-rays.  I discussed with Dr. Ermalinda Memos and reviewed their note for today.  I agree with their assessment and plan.     Additionally  Feeling better eating fruit Change to oral medications when can keep down and stay hydrated may discharge on 2-3 weeks for oral antibiotics

## 2011-12-19 NOTE — Progress Notes (Signed)
Patient refusing her 2200 dose of zofran, and preemptively refusing her midnight dose, states she is "not nauseated," but still requesting her midnight dose of reglan. Will continue to monitor pt.

## 2011-12-19 NOTE — Progress Notes (Signed)
Afebrile.  V/S  Stable. Feels better.  Less pain. Still has nausea. Suggest: Continue antibiotics.  Switch to PO meds when she tolerates diet well Can go home when stable on oral antibiotics.

## 2011-12-20 LAB — CULTURE, BLOOD (ROUTINE X 2): Culture: NO GROWTH

## 2011-12-20 LAB — CBC
HCT: 30.7 % — ABNORMAL LOW (ref 36.0–46.0)
MCHC: 33.9 g/dL (ref 30.0–36.0)
MCV: 87 fL (ref 78.0–100.0)
RDW: 14.1 % (ref 11.5–15.5)
WBC: 9.2 10*3/uL (ref 4.0–10.5)

## 2011-12-20 LAB — BASIC METABOLIC PANEL
BUN: <3 mg/dL — ABNORMAL LOW (ref 6–23)
CO2: 28 mEq/L (ref 19–32)
Calcium: 8.1 mg/dL — ABNORMAL LOW (ref 8.4–10.5)
Creatinine, Ser: 0.78 mg/dL (ref 0.50–1.10)
Glucose, Bld: 198 mg/dL — ABNORMAL HIGH (ref 70–99)

## 2011-12-20 MED ORDER — PROMETHAZINE HCL 25 MG PO TABS: 25.0000 mg | ORAL_TABLET | Freq: Four times a day (QID) | ORAL | Status: DC | PRN

## 2011-12-20 MED ORDER — POTASSIUM CHLORIDE CRYS ER 20 MEQ PO TBCR
20.0000 meq | EXTENDED_RELEASE_TABLET | Freq: Once | ORAL | Status: DC
  Filled 2011-12-20: qty 1

## 2011-12-20 MED ORDER — CIPROFLOXACIN HCL 500 MG PO TABS: 500.0000 mg | ORAL_TABLET | Freq: Two times a day (BID) | ORAL | Status: DC

## 2011-12-20 MED ORDER — INSULIN GLARGINE 100 UNIT/ML ~~LOC~~ SOLN: 25.0000 [IU] | Freq: Every day | SUBCUTANEOUS | Status: DC

## 2011-12-20 MED ORDER — POTASSIUM CHLORIDE 20 MEQ/15ML (10%) PO LIQD
20.0000 meq | Freq: Once | ORAL | Status: AC
  Administered 2011-12-20: 20 meq via ORAL
  Filled 2011-12-20: qty 15

## 2011-12-20 MED ORDER — CIPROFLOXACIN HCL 500 MG PO TABS
500.0000 mg | ORAL_TABLET | Freq: Two times a day (BID) | ORAL | Status: DC
  Administered 2011-12-20: 500 mg via ORAL
  Filled 2011-12-20 (×4): qty 1

## 2011-12-20 MED ORDER — METOCLOPRAMIDE HCL 10 MG PO TABS
10.0000 mg | ORAL_TABLET | Freq: Three times a day (TID) | ORAL | Status: DC
  Administered 2011-12-20: 10 mg via ORAL
  Filled 2011-12-20 (×4): qty 1

## 2011-12-20 MED ORDER — OXYCODONE-ACETAMINOPHEN 5-325 MG PO TABS: 1.0000 | ORAL_TABLET | ORAL | Status: DC | PRN

## 2011-12-20 MED ORDER — PANTOPRAZOLE SODIUM 40 MG PO TBEC
40.0000 mg | DELAYED_RELEASE_TABLET | Freq: Every day | ORAL | Status: DC
  Administered 2011-12-20: 40 mg via ORAL
  Filled 2011-12-20: qty 1

## 2011-12-20 MED ORDER — METOCLOPRAMIDE HCL 10 MG PO TABS: 10.0000 mg | ORAL_TABLET | Freq: Three times a day (TID) | ORAL | Status: DC

## 2011-12-20 NOTE — Progress Notes (Signed)
Family Medicine Teaching Service Daily Progress Note/Transfer Note Service Page: [phone]  Patient Assessment: 31 yo female with T1DM(A1C: 9.8), HTN, HLD who presented with abdominal pain and vomiting likely due to renal abcess.  Subjective:  Pain continues to improve, meds helping but wear off too quickly. Still nauseous but no vomiting and she states that she is not taking the nausea meds. Tolerating food well.   Per nursing she grabbed IV reglan while they were pushing it and pushed it herself as fast as she could stating that they were not pushing it fast enough.   Objective: Temp:  [98.4 F (36.9 C)-100.1 F (37.8 C)] 98.9 F (37.2 C) (11/20 0523) Pulse Rate:  [81-96] 81  (11/20 0523) Resp:  [17-18] 17  (11/20 0523) BP: (110-156)/(67-94) 110/67 mmHg (11/20 0523) SpO2:  [99 %-100 %] 100 % (11/20 0523) Weight:  [138 lb 11.2 oz (62.914 kg)] 138 lb 11.2 oz (62.914 kg) (11/19 2046) Exam:  Gen: NAD, alert and oriented x4 HEENT: NCAT CV: RRR, good S1/S2, 2/6 systolic flow murmur Resp: CTABL, no wheezes, non-labored Abd: Soft, tender to palpation mostly in LLQ and left flank. Some voluntary guarding, not wanting me to check CVA tenderness this am.  Ext: No edema, 2+ DP pulses.  Neuro: Alert and oriented, No gross deficits   I have reviewed the patient's medications, labs, imaging, and diagnostic testing.  Notable results are summarized below.  CBC BMET   Lab 12/19/11 0500 12/18/11 0500 12/17/11 0620  WBC 11.6* 12.7* 15.6*  HGB 10.6* 11.0* 12.4  HCT 31.5* 33.1* 37.1  PLT 316 307 317    Lab 12/20/11 0455 12/19/11 2145 12/19/11 0500  NA 138 138 143  K 3.3* 3.4* 3.7  CL 103 100 104  CO2 28 27 28   BUN <3* <3* 4*  CREATININE 0.78 0.79 0.71  GLUCOSE 198* 164* 162*  CALCIUM 8.1* 8.4 8.1*     Imaging/Diagnostic Tests: Renal US 11/13 IMPRESSION:  Negative renal ultrasound.  Pelvic US 11/13 IMPRESSION:  Normal study. No evidence of pelvic mass or other significant    abnormality.  CT abdomen with contrast IMPRESSION:  Left upper pole heterogeneous ill-defined lesions spanning over 3.9  x 4.2 x 3.2 cm. The appearance is consistent with renal abscesses  given the provided history. Periaortic adenopathy.  Fluid within the pelvis. This may be related to recent ovulation  as there is a corpus luteum cyst anterior left aspect of the  pelvis. Because of the fluid, lack of fat planes and lack of  contrast within bowel, it is difficult to exclude primary bowel  inflammatory process including appendicitis.   Plan:  31 yo female with T1DM (A1C: 9.8), HTN, HLD who presented with abdominal pain and vomiting.   Abdominal pain and renal abscess:  Improving - S/p IR per drain on 11/15, pain improving and controlled on PO pain meds - Afebrile - Hemoglobin trending down, 10.6 on 11/19 from 12.0 at admission (concern for bleeding from drain procedure)- pending today - treated for pyelonephritis with zosyn (start 11/14) which was interrupted due to IV loss on 11/17 was treated with IM cefepime in the interim, now back on IV zosyn.  - transition to PO cipro today - Urine culture pan-sensitive E-coli - Abscess culture NGTD- plan to narrow Abx to E coli when she improves clinically - pain control: controlled on PO oxycodone, change from IR to percocet - Plan to re-image with CT abdomen today if coarse worsens  DM1:  - CBGs 163-261 -  Lantus 25 qd, 5 units regular TID and 3 at night - Increase to home dose of 30 units lantus - daily BMP  Nausea and vomiting: improving - tolerating some fluids, applesauce, and jello.  - Likely from gastroparesis and infectious illness.   - continue zofran, reglan and phenergan- change to all PO - IVF NS with 20 KCl at 100 ml/hr, dc today  HTN - Elevated 129-162/67-96 - not on antihypertensives as outpatient. Consider starting lisinopril for BP and renal protection in setting of DM, if BP persists once pain resolves -  elevation possibly correcting now that pain improving  HLD - Lipid panel with NL LDL, HDL, Trigs, and cholesterol on 07/02/11 - Elevated LDL/cholesterol previously - defer to PCP for start of statin  FEN/GI: carb modified, d/c fluids if tolerates diet PPx: SCDs Dispo: Home pending clinical improvement   Code Status: Full   Kevin Fenton, MD 12/20/2011, 9:19 AM

## 2011-12-20 NOTE — Progress Notes (Signed)
While attempting to give pt her IV reglan, pt became upset, stating "you're taking too long." Attempted to teach the pt about the importance of giving IV medications slowly, particularly reglan, due to side effects, pt took syringe and pushed medication into her own IV, stating "you don't need to teach me anything." Will continue to monitor pt.

## 2011-12-20 NOTE — Progress Notes (Signed)
While rounding on pt, pt stated "I thought you were going to give me reglan and morphine at 9." Explained to pt that the medication she was scheduled for was zofran, and reminded her that she refused. Also reminded pt that her pain medication has been switched to PO medication. Pt agreeable. Pt again refused midnight dose of zofran. Will continue to monitor.

## 2011-12-20 NOTE — Progress Notes (Signed)
Family Medicine Teaching Service Attending Note  I interviewed and examined patient [name] and reviewed their tests and x-rays.  I discussed with Dr. Ermalinda Memos and reviewed their note for today.  I agree with their assessment and plan.     Additionally  Feels ready to go [facility] to discharge on prolonged course of oral antibiotics with close follow up

## 2011-12-20 NOTE — Discharge Summary (Signed)
Family Medicine Teaching [facility] Discharge Summary  Patient name: [name] Medical record number: [id number] Date of birth: 1980/04/19 Age: 31 y.o. Gender: female Date of Admission: 12/13/2011  Date of Discharge: 12/20/2011 Admitting Physician: Leroy Sea, MD  Primary Care Provider: Marena Chancy, MD  Indication for Hospitalization: pyelonephritis Discharge Diagnoses:  1. Pyelonephritis 2. Renal Abscess 3. DM1 4. Persistent nausea and vomiting 5. HTN/ elevated blood pressure  Consultations: Urology, OB Gyn  Significant Labs and Imaging:  Renal US 11/13  IMPRESSION:  Negative renal ultrasound.   Pelvic US 11/13  IMPRESSION:  Normal study. No evidence of pelvic mass or other significant  abnormality.   CT abdomen with contrast  IMPRESSION:  Left upper pole heterogeneous ill-defined lesions spanning over 3.9  x 4.2 x 3.2 cm. The appearance is consistent with renal abscesses  given the provided history. Periaortic adenopathy.  Fluid within the pelvis. This may be related to recent ovulation  as there is a corpus luteum cyst anterior left aspect of the  pelvis. Because of the fluid, lack of fat planes and lack of  contrast within bowel, it is difficult to exclude primary bowel  inflammatory process including appendicitis.   WBC on admission 23.9, 9.2 on discharge A1C 9.8    12/13/2011 16:56  Color, Urine YELLOW  APPearance HAZY (A)  Specific Gravity, Urine 1.035 (H)  pH 5.5  Glucose >1000 (A)  Bilirubin Urine NEGATIVE  Ketones, ur 40 (A)  Protein NEGATIVE  Urobilinogen, UA 0.2  Nitrite NEGATIVE  Leukocytes, UA SMALL (A)  Hgb urine dipstick MODERATE (A)  WBC, UA TOO NUMEROUS TO COUNT  RBC / HPF 7-10  Squamous Epithelial / LPF MANY (A)  Bacteria, UA MANY (A)    Results for orders placed during the hospital encounter of 12/13/11  URINE CULTURE     Status: Normal   Collection Time   12/13/11  4:56 PM      Component Value Range Status  Comment   Specimen Description URINE, CLEAN CATCH   Final    Special Requests NONE   Final    Culture  Setup Time 12/13/2011 18:56   Final    Colony Count >=100,000 COLONIES/ML   Final    Culture ESCHERICHIA COLI   Final    Report Status 12/16/2011 FINAL   Final    Organism ID, Bacteria ESCHERICHIA COLI   Final   CULTURE, BLOOD (ROUTINE X 2)     Status: Normal   Collection Time   12/13/11  7:15 PM      Component Value Range Status Comment   Specimen Description BLOOD ARM RIGHT   Final    Special Requests BOTTLES DRAWN AEROBIC AND ANAEROBIC 10CC   Final    Culture  Setup Time 12/14/2011 04:14   Final    Culture NO GROWTH 5 DAYS   Final    Report Status 12/20/2011 FINAL   Final   CULTURE, BLOOD (ROUTINE X 2)     Status: Normal   Collection Time   12/13/11  7:20 PM      Component Value Range Status Comment   Specimen Description BLOOD HAND RIGHT   Final    Special Requests BOTTLES DRAWN AEROBIC ONLY 10CC   Final    Culture  Setup Time 12/14/2011 04:14   Final    Culture NO GROWTH 5 DAYS   Final    Report Status 12/20/2011 FINAL   Final   WET PREP, GENITAL     Status: Normal  Collection Time   12/14/11 11:28 AM      Component Value Range Status Comment   Yeast Wet Prep HPF POC NONE SEEN  NONE SEEN Final    Trich, Wet Prep NONE SEEN  NONE SEEN Final    Clue Cells Wet Prep HPF POC NONE SEEN  NONE SEEN Final    WBC, Wet Prep HPF POC NONE SEEN  NONE SEEN Final   GC/CHLAMYDIA PROBE AMP     Status: Normal   Collection Time   12/14/11  3:19 PM      Component Value Range Status Comment   CT Probe RNA NEGATIVE  NEGATIVE Final    GC Probe RNA NEGATIVE  NEGATIVE Final   CULTURE, ROUTINE-ABSCESS     Status: Normal   Collection Time   12/15/11 12:16 PM      Component Value Range Status Comment   Specimen Description ABSCESS   Final    Special Requests KIDNEY LEFT   Final    Gram Stain     Final    Value: ABUNDANT WBC PRESENT, PREDOMINANTLY PMN     NO SQUAMOUS EPITHELIAL CELLS SEEN      NO ORGANISMS SEEN   Culture NO GROWTH 3 DAYS   Final    Report Status 12/18/2011 FINAL   Final     Procedures: CT guided  needle aspiration renal abscess 11/15 Brief Hospital Course:   Plan:  31 yo female with T1DM (A1C: 9.8), HTN, HLD who presented with abdominal pain and vomiting.   Pyelonephritis She presented to our ED with BL flank pain and was found to have pyelonephritis complicated by a renal abcess. She was initially admitted by [facility] and care transferred to [place] when they realized she was our patient. For abdominal pain she was seen by an OB/Gyn to evaluate for PID (which was ruled out) and for the renal abscess she was seen by urology. Urology suggested IR drainage of the abscess and IV antibiotics. The abscess was drained and cultured on 12/15/2011.  She initially treated with Iv rocephin which was escalated and continued for the next 6 days. She continued to remained on IV zosyn as she continued to have pain, nausea, and could not tolerate fluids. She was supported with IV fluids and remained NPO for several days. Her nausea is discussed below. When she began to tolerate PO intake we slowly advanced her diet and then transitioned her to PO ciprofloxacin and PO anti-emetics as below. Urine cultures yielded E coli pansensitive so the choice of antibiotics was ciprofloxacin. We discharged her with antibiotics to finish a 21 day coarse. The abscess and blood cultures are negative for growth to date. Her pain was controlled initially with IV pain medicines which were transitioned to PO meds when she could tolerate it. She was sent home with 5 mg percocet Q4 PRN for pain.   DM1:  Her DM1 was managed throughout admission with lantus and sliding scale insulin. After a hypoglycemic episiode she missed a dose of lantus and slipped into DKA for a short time with an AG as large as 22. She was re-started on lantus and her gap closed without an insulin drip. SHe was sent home on her  previous home doses of insulin.   Nausea and vomiting: Throughout her admission her nasuea and vomiting were persistent. It's thought that it stemmed both from her acute illness and some underlying gastroperesis. We eventually gained control of her nause with reglan, phenergan, and zofran. She was discharged  on phenergan and reglan. Per her history it had a small almount of blood in it before she was under our care. We think this is likely due to mallory weiss tears from her repeated vomiting.   HTN  Her BP was evaluated throughout admission thought to be due to her pain. We defferred treating her BP until her pain was under control. It normalized once her pain improved.   Dispo: Home with f/u in 1-2 weeks as below   Discharge Medications:    Medication List     As of 12/20/2011  2:39 PM    TAKE these medications         acetaminophen-codeine 300-30 MG per tablet   Commonly known as: TYLENOL #3   Take 1 tablet by mouth 3 (three) times daily as needed. For pain      ciprofloxacin 500 MG tablet   Commonly known as: CIPRO   Take 1 tablet (500 mg total) by mouth 2 (two) times daily.      ibuprofen 800 MG tablet   Commonly known as: ADVIL,MOTRIN   Take 800 mg by mouth every 8 (eight) hours as needed. For pain      insulin aspart 100 UNIT/ML injection   Commonly known as: novoLOG   Inject 5-6 Units into the skin 3 (three) times daily as needed. For CBG over 180      insulin glargine 100 UNIT/ML injection   Commonly known as: LANTUS   Inject 30 Units into the skin every evening. At 5pm      metoCLOPramide 10 MG tablet   Commonly known as: REGLAN   Take 1 tablet (10 mg total) by mouth 4 (four) times daily -  before meals and at bedtime.      multivitamin with minerals Tabs   Take 1 tablet by mouth daily.      oxyCODONE-acetaminophen 5-325 MG per tablet   Commonly known as: PERCOCET/ROXICET   Take 1 tablet by mouth every 4 (four) hours as needed.      promethazine 25 MG  tablet   Commonly known as: PHENERGAN   Take 1 tablet (25 mg total) by mouth every 6 (six) hours as needed.       Issues for Follow Up:  - consider gastric emptying study to eval for possible gastroparesis - Check BP and address if elevated - Consider work-up for repeated UTI and pyelonephritis  Outstanding Results: None  Discharge Instructions: Please refer to Patient Instructions section of EMR for full details.  Patient was counseled important signs and symptoms that should prompt return to medical care, changes in medications, dietary instructions, activity restrictions, and follow up appointments.       Follow-up Information    Follow up with Marena Chancy, MD. On 01/01/2012. (scheduled at 2:15)    Contact information:   [address] [place] [address] [phone]          Discharge Condition: Carlena Sax, MD 12/20/2011, 2:39 PM

## 2011-12-20 NOTE — Progress Notes (Signed)
Pt PICC repeatedly beeping "occluded pt side" despite both lumens infusing. Multiple attempts made to reposition tubing, verified clamps are open and not kinked, flushed ports. Pt expresses frustration regarding IV keeping her up all night, and requests for a "specialist" to come fix it. IV team notified, team requested that the RN who comes for lab draw this AM assess it while on the unit. Will continue to monitor.

## 2011-12-20 NOTE — Progress Notes (Addendum)
Pt. discharged to home. Pt after visit summary reviewed and pt capable of re verbalizing medications and follow up appointments. Pt given prescriptions as ordered. Pt remains stable. No signs and symptoms of distress. Educated to return to ER in the event of SOB, dizziness, chest pain, or fainting. Pt with occlusive dressing to RUA post PICC removal. Educated to monitor site for any signs of draining or bleeding. Burley Saver, RN

## 2011-12-21 LAB — GLUCOSE, CAPILLARY: Glucose-Capillary: 202 mg/dL — ABNORMAL HIGH (ref 70–99)

## 2011-12-22 NOTE — Progress Notes (Signed)
Family Medicine Teaching Service Attending Note  On 11/19 I interviewed and examined patient [name] and reviewed their tests and x-rays.  I discussed with Dr. Claiborne Billings and reviewed their note for today.  I agree with their assessment and plan.

## 2011-12-22 NOTE — Discharge Summary (Signed)
I have reviewed this discharge summary and agree.    

## 2012-01-01 ENCOUNTER — Inpatient Hospital Stay: Payer: Medicaid Other | Admitting: Family Medicine

## 2012-01-02 ENCOUNTER — Other Ambulatory Visit: Payer: Self-pay | Admitting: Family Medicine

## 2012-01-02 DIAGNOSIS — E1065 Type 1 diabetes mellitus with hyperglycemia: Secondary | ICD-10-CM

## 2012-01-02 DIAGNOSIS — IMO0002 Reserved for concepts with insufficient information to code with codable children: Secondary | ICD-10-CM

## 2012-01-02 MED ORDER — CVS LANCETS ORIGINAL MISC: 1.0000 | Freq: Three times a day (TID) | Status: DC

## 2012-01-02 MED ORDER — GLUCOSE BLOOD VI STRP: ORAL_STRIP | Status: DC

## 2012-01-02 NOTE — Telephone Encounter (Signed)
Refilled test strips for aviva accucheck as well as for lancets.

## 2012-01-16 ENCOUNTER — Ambulatory Visit (INDEPENDENT_AMBULATORY_CARE_PROVIDER_SITE_OTHER): Payer: Medicaid Other | Admitting: Family Medicine

## 2012-01-16 ENCOUNTER — Other Ambulatory Visit (HOSPITAL_COMMUNITY)
Admission: RE | Admit: 2012-01-16 | Discharge: 2012-01-16 | Disposition: A | Discharge status: Home / Self Care | Payer: Medicaid Other | Source: Ambulatory Visit | Attending: Family Medicine | Admitting: Family Medicine

## 2012-01-16 VITALS — BP 122/81 | HR 90 | Temp 98.6°F | Ht 67.0 in | Wt 152.0 lb

## 2012-01-16 DIAGNOSIS — N898 Other specified noninflammatory disorders of vagina: Secondary | ICD-10-CM

## 2012-01-16 DIAGNOSIS — R3915 Urgency of urination: Secondary | ICD-10-CM

## 2012-01-16 DIAGNOSIS — N760 Acute vaginitis: Secondary | ICD-10-CM

## 2012-01-16 DIAGNOSIS — Z113 Encounter for screening for infections with a predominantly sexual mode of transmission: Secondary | ICD-10-CM | POA: Insufficient documentation

## 2012-01-16 DIAGNOSIS — E1065 Type 1 diabetes mellitus with hyperglycemia: Secondary | ICD-10-CM

## 2012-01-16 DIAGNOSIS — N390 Urinary tract infection, site not specified: Secondary | ICD-10-CM

## 2012-01-16 DIAGNOSIS — IMO0002 Reserved for concepts with insufficient information to code with codable children: Secondary | ICD-10-CM

## 2012-01-16 LAB — POCT WET PREP (WET MOUNT): Clue Cells Wet Prep Whiff POC: POSITIVE

## 2012-01-16 LAB — POCT URINALYSIS DIPSTICK
Leukocytes, UA: NEGATIVE
Protein, UA: NEGATIVE
Spec Grav, UA: 1.015
Urobilinogen, UA: 0.2

## 2012-01-16 MED ORDER — METRONIDAZOLE 500 MG PO TABS: 500.0000 mg | ORAL_TABLET | Freq: Two times a day (BID) | ORAL | Status: DC

## 2012-01-16 MED ORDER — FLUCONAZOLE 150 MG PO TABS: 150.0000 mg | ORAL_TABLET | Freq: Once | ORAL | Status: DC

## 2012-01-16 MED ORDER — CIPROFLOXACIN HCL 500 MG PO TABS: 500.0000 mg | ORAL_TABLET | Freq: Two times a day (BID) | ORAL | Status: DC

## 2012-01-16 NOTE — Progress Notes (Signed)
Patient ID: [name]    DOB: 27-Apr-1980, 31 y.o.   MRN: [id number] --- Subjective:  [name] is a 31 y.o.female with type 1 diabetes who presents for follow up on pyelo and left renal abscess for which she was d/ced from hospital on 11/20.  She came to the clinic today because of 3 days of left sided low back pain and urinary frequency. She was concerned that this could be a recurrence of pyelo. Nocturia: every 15 minutes. No dysuria. No hematuria. No abdominal pain, no nausea, no vomiting. She finished cipro 1 week ago.   She also complains of vaginal itching and some whitish discharge x 1 week.   Diabetes: has not been checking CBG's because she ran out of strips but has been giving herself 30units of lantus and 6 units of novolog.   ROS: see HPI Past Medical History: reviewed and updated medications and allergies. Social History: Tobacco: 0.3 pack per day  Objective: Filed Vitals:   01/16/12 1458  BP: 122/81  Pulse: 90  Temp: 98.6 F (37 C)    Physical Examination:   General appearance - alert, well appearing, and in no distress Chest - clear to auscultation, no wheezes, rales or rhonchi, symmetric air entry Heart - normal rate, regular rhythm, normal S1, S2, no murmurs, rubs, clicks or gallops Abdomen - soft, nontender, nondistended, no masses or organomegaly Left CVA tenderness, no right CVA tenderness Extremities - no pedal edema Pelvic exam: normal external genitalia, vulva, vagina, cervix, white curd like discharge

## 2012-01-16 NOTE — Patient Instructions (Addendum)
I will call you back this evening to touch base about your lab results

## 2012-01-18 DIAGNOSIS — N390 Urinary tract infection, site not specified: Secondary | ICD-10-CM | POA: Insufficient documentation

## 2012-01-18 NOTE — Assessment & Plan Note (Addendum)
No evidence of infection on urinalysis, but given symptoms, will treat empirically until culture results return. Polyuria likely from uncontrolled diabetes. Ms. [name] has had at least 3 documented UTI's in 1 year. THis is likely to uncontroled diabetes. Will attempt to control DM better before starting prophylactic antibiotics. Discussed with patient who agrees.

## 2012-01-18 NOTE — Assessment & Plan Note (Signed)
Wet prep showing evidence of BV and yeast. Will first treat yeast infection with diflucan x3 and send flagyl for patient to take after course of antibiotics for presumed UTI.

## 2012-01-18 NOTE — Assessment & Plan Note (Signed)
Polyuria likely from uncontrolled DM, given presence of 500 glucose in urine. Instructed patient to get strips to check CBG's 3-4 times daily.

## 2012-01-19 ENCOUNTER — Telehealth: Payer: Self-pay | Admitting: Family Medicine

## 2012-01-19 NOTE — Telephone Encounter (Signed)
Called patient and left message on her home phone, stating that her urine culture did not grow anything significant and that she can stop the cipro antibiotic.

## 2012-01-20 LAB — URINE CULTURE

## 2012-01-26 ENCOUNTER — Telehealth: Payer: Self-pay | Admitting: Family Medicine

## 2012-01-26 DIAGNOSIS — N390 Urinary tract infection, site not specified: Secondary | ICD-10-CM

## 2012-01-26 MED ORDER — SULFAMETHOXAZOLE-TRIMETHOPRIM 800-160 MG PO TABS: 1.0000 | ORAL_TABLET | Freq: Two times a day (BID) | ORAL | Status: DC

## 2012-01-26 NOTE — Telephone Encounter (Signed)
Called patient about results of urine that grew 15,000 MRSA. Patient had been on cipro for empirica coverage of UTI. Patient continues to have bilateral lower back pain better with ibuprofen. No nausea, no vomiting. No dysuria. She ahs been eating and drinking without difficulty. No fevers or chills. She denies any lower extremity weakness, numbness or urine or bowel incontinence. She reports 3 episodes in the last year of feeling weak and like she is going to "fall out". Last time she had this was yesterday evening. She did not check CBG at the time, drank juice and felt better.  Unclear what episode is from, other than possibly hypoglycemia. Recommended that she check her CBGs regularly and keep an eye on these episodes.   Advised patient that she come to clinic for a repeat urine and urine culture. Culture result could be contaminant, but if again positive, will obtain blood culture. Will stop cipro since resistant to it. Will switch to bactrim until repeat culture returns. Patient agreed to this. Recommended that patient be seen in clinic if symptoms persist or worsen. She states she is scheduled to see me on January 2nd. I do not see her listed on my schedule and asked her to double check with front staff.

## 2012-01-29 ENCOUNTER — Inpatient Hospital Stay: Payer: Medicaid Other | Admitting: Family Medicine

## 2012-02-05 ENCOUNTER — Telehealth: Payer: Self-pay | Admitting: Family Medicine

## 2012-02-05 NOTE — Telephone Encounter (Signed)
Patient is calling for a copy of there tb test results.  Please call her when it is ready to be picked up.

## 2012-02-05 NOTE — Telephone Encounter (Signed)
Patient notified that PPD  letter is ready for pick up.

## 2012-02-08 ENCOUNTER — Ambulatory Visit: Payer: Medicaid Other | Admitting: Family Medicine

## 2012-03-11 ENCOUNTER — Inpatient Hospital Stay (HOSPITAL_COMMUNITY)
Admission: EM | Admit: 2012-03-11 | Discharge: 2012-03-16 | DRG: 074 | Disposition: A | Discharge status: Home / Self Care | Payer: Medicaid Other | Attending: Family Medicine | Admitting: Family Medicine

## 2012-03-11 ENCOUNTER — Encounter (HOSPITAL_COMMUNITY): Payer: Self-pay | Admitting: Nurse Practitioner

## 2012-03-11 ENCOUNTER — Encounter: Payer: Self-pay | Admitting: Family Medicine

## 2012-03-11 ENCOUNTER — Ambulatory Visit: Payer: Medicaid Other | Admitting: Family Medicine

## 2012-03-11 VITALS — BP 165/105 | HR 101 | Temp 99.2°F | Wt 144.4 lb

## 2012-03-11 DIAGNOSIS — R1110 Vomiting, unspecified: Secondary | ICD-10-CM

## 2012-03-11 DIAGNOSIS — K3184 Gastroparesis: Secondary | ICD-10-CM

## 2012-03-11 DIAGNOSIS — M6530 Trigger finger, unspecified finger: Secondary | ICD-10-CM

## 2012-03-11 DIAGNOSIS — N760 Acute vaginitis: Secondary | ICD-10-CM

## 2012-03-11 DIAGNOSIS — Z23 Encounter for immunization: Secondary | ICD-10-CM

## 2012-03-11 DIAGNOSIS — Z794 Long term (current) use of insulin: Secondary | ICD-10-CM

## 2012-03-11 DIAGNOSIS — F17200 Nicotine dependence, unspecified, uncomplicated: Secondary | ICD-10-CM

## 2012-03-11 DIAGNOSIS — Z79899 Other long term (current) drug therapy: Secondary | ICD-10-CM

## 2012-03-11 DIAGNOSIS — N390 Urinary tract infection, site not specified: Secondary | ICD-10-CM

## 2012-03-11 DIAGNOSIS — E7800 Pure hypercholesterolemia, unspecified: Secondary | ICD-10-CM

## 2012-03-11 DIAGNOSIS — E876 Hypokalemia: Secondary | ICD-10-CM | POA: Diagnosis not present

## 2012-03-11 DIAGNOSIS — I10 Essential (primary) hypertension: Secondary | ICD-10-CM | POA: Diagnosis present

## 2012-03-11 DIAGNOSIS — E1049 Type 1 diabetes mellitus with other diabetic neurological complication: Principal | ICD-10-CM | POA: Diagnosis present

## 2012-03-11 DIAGNOSIS — E1065 Type 1 diabetes mellitus with hyperglycemia: Secondary | ICD-10-CM

## 2012-03-11 DIAGNOSIS — IMO0002 Reserved for concepts with insufficient information to code with codable children: Secondary | ICD-10-CM

## 2012-03-11 DIAGNOSIS — R112 Nausea with vomiting, unspecified: Secondary | ICD-10-CM | POA: Diagnosis present

## 2012-03-11 DIAGNOSIS — R1115 Cyclical vomiting syndrome unrelated to migraine: Secondary | ICD-10-CM

## 2012-03-11 DIAGNOSIS — E785 Hyperlipidemia, unspecified: Secondary | ICD-10-CM | POA: Diagnosis present

## 2012-03-11 DIAGNOSIS — E103599 Type 1 diabetes mellitus with proliferative diabetic retinopathy without macular edema, unspecified eye: Secondary | ICD-10-CM | POA: Diagnosis present

## 2012-03-11 DIAGNOSIS — A599 Trichomoniasis, unspecified: Secondary | ICD-10-CM | POA: Diagnosis present

## 2012-03-11 HISTORY — DX: Disorder of kidney and ureter, unspecified: N28.9

## 2012-03-11 LAB — COMPREHENSIVE METABOLIC PANEL
ALT: 18 U/L (ref 0–35)
Calcium: 9.9 mg/dL (ref 8.4–10.5)
GFR calc Af Amer: >90 mL/min (ref >90)
Glucose, Bld: 283 mg/dL — ABNORMAL HIGH (ref 70–99)
Sodium: 142 mEq/L (ref 135–145)
Total Protein: 8.7 g/dL — ABNORMAL HIGH (ref 6.0–8.3)

## 2012-03-11 LAB — URINALYSIS, MICROSCOPIC ONLY
Glucose, UA: >1000 mg/dL — AB
Specific Gravity, Urine: 1.028 (ref 1.005–1.030)

## 2012-03-11 LAB — CBC WITH DIFFERENTIAL/PLATELET
HCT: 37.8 % (ref 36.0–46.0)
Hemoglobin: 12.6 g/dL (ref 12.0–15.0)
Lymphocytes Relative: 8 % — ABNORMAL LOW (ref 12–46)
Lymphs Abs: 0.8 10*3/uL (ref 0.7–4.0)
MCHC: 33.3 g/dL (ref 30.0–36.0)
Monocytes Absolute: 0.7 10*3/uL (ref 0.1–1.0)
Monocytes Relative: 8 % (ref 3–12)
Neutro Abs: 8.2 10*3/uL — ABNORMAL HIGH (ref 1.7–7.7)
WBC: 9.7 10*3/uL (ref 4.0–10.5)

## 2012-03-11 LAB — GLUCOSE, CAPILLARY: Glucose-Capillary: 231 mg/dL — ABNORMAL HIGH (ref 70–99)

## 2012-03-11 MED ORDER — MORPHINE SULFATE 4 MG/ML IJ SOLN
4.0000 mg | Freq: Once | INTRAMUSCULAR | Status: AC
  Administered 2012-03-11: 4 mg via INTRAVENOUS
  Filled 2012-03-11: qty 1

## 2012-03-11 MED ORDER — SODIUM CHLORIDE 0.9 % IV BOLUS (SEPSIS)
1000.0000 mL | Freq: Once | INTRAVENOUS | Status: AC
  Administered 2012-03-11: 1000 mL via INTRAVENOUS

## 2012-03-11 MED ORDER — ONDANSETRON 8 MG PO TBDP
8.0000 mg | ORAL_TABLET | Freq: Once | ORAL | Status: DC
  Filled 2012-03-11: qty 2

## 2012-03-11 MED ORDER — ONDANSETRON HCL 4 MG/2ML IJ SOLN: 4.0000 mg | Freq: Once | INTRAMUSCULAR | Status: DC

## 2012-03-11 MED ORDER — METOCLOPRAMIDE HCL 5 MG/ML IJ SOLN
10.0000 mg | Freq: Once | INTRAMUSCULAR | Status: AC
  Administered 2012-03-11: 10 mg via INTRAVENOUS
  Filled 2012-03-11: qty 2

## 2012-03-11 MED ORDER — FAMOTIDINE IN NACL 20-0.9 MG/50ML-% IV SOLN
20.0000 mg | Freq: Once | INTRAVENOUS | Status: AC
  Administered 2012-03-11: 20 mg via INTRAVENOUS
  Filled 2012-03-11: qty 50

## 2012-03-11 NOTE — Assessment & Plan Note (Addendum)
Pt is DMT1 with sudden onset of intractable N/V and abdominal pain. Bloody vomiting per pt's report. D/D include DKA, acute abdominal process, viral illness. Bloody emesis can be secondary to esophageal irritation from vomiting but also concerning for MW tear.  Plan: Zofran 4mg  (disolving tab) Pt sent to ED for further evaluation and treatment.

## 2012-03-11 NOTE — ED Notes (Signed)
Pt c/o abd pain with vomiting since last night. Reports she noticed some blood in the vomit. Denies bowel/bladder changes

## 2012-03-11 NOTE — ED Provider Notes (Signed)
History     CSN: [id number]  Arrival date & time 03/11/12  1714   First MD Initiated Contact with Patient 03/11/12 1942      Chief Complaint  Patient presents with  . Emesis    (Consider location/radiation/quality/duration/timing/severity/associated sxs/prior treatment) Patient is a 32 y.o. female presenting with vomiting. The history is provided by the patient. No language interpreter was used.  Emesis Severity:  Moderate Timing:  Intermittent Quality:  Stomach contents (blood streaked) Progression:  Worsening Recent urination:  Normal Relieved by:  Nothing Worsened by:  Liquids Ineffective treatments:  Antiemetics Associated symptoms: abdominal pain   Associated symptoms: no diarrhea and no fever   Risk factors: alcohol use and diabetes   Risk factors: not pregnant now    32 year old female with complaint of nausea and vomiting x24 hours with generalized abdominal pain. States she did have some blood streaking in her vomitus. Yesterday she drank 1 beer and after that the vomiting began. States in November last year she had an abscess on her kidney. Denies any pain in that area today. She took a Reglan pill at home and went to the doctor's office where she continued to vomit. In the doctor's office they gave her Zofran under her tongue but she continued to vomit. Denies dysuria, hematuria,  fever or diarrhea. No sick contacts. Past medical history of diabetes, gastroparesis, hyperlipidemia, hypertension,   Past Medical History  Diagnosis Date  . Hyperlipidemia   . Hypertension   . Diabetes mellitus     A1c > 11. Patient does not check BG or take insulin regularly.  Dx at age 65.  . Tobacco abuse   . Incarceration     Was in jail for selling drugs - out Oct 2010.  [name] Renal disorder     Past Surgical History  Procedure Laterality Date  . Tubal ligation  2009    History reviewed. No pertinent family history.  History  Substance Use Topics  . Smoking status: Current  Every Day Smoker -- 0.30 packs/day    Types: Cigarettes  . Smokeless tobacco: Never Used     Comment: Quit 09/21/2010 while hospitalilzed/restarted again.   . Alcohol Use: 0.5 oz/week    1 drink(s) per week     Comment: Occasional.    OB History   Grav Para Term Preterm Abortions TAB SAB Ect Mult Living                  Review of Systems  Constitutional: Negative.   HENT: Negative.   Eyes: Negative.   Respiratory: Negative.   Cardiovascular: Negative.   Gastrointestinal: Positive for vomiting and abdominal pain. Negative for diarrhea, constipation, blood in stool and abdominal distention.  Neurological: Negative.   Psychiatric/Behavioral: Negative.   All other systems reviewed and are negative.    Allergies  Review of patient's allergies indicates no known allergies.  Home Medications   Current Outpatient Rx  Name  Route  Sig  Dispense  Refill  . [employer] Lancets Original MISC   Does not apply   1 Stick by Does not apply route 4 (four) times daily -  before meals and at bedtime.   120 each   12   . glucose blood (ACCU-CHEK AVIVA) test strip      Use as instructed   100 each   12   . ibuprofen (ADVIL,MOTRIN) 800 MG tablet   Oral   Take 800 mg by mouth every 8 (eight) hours as needed. For pain         .  insulin aspart (NOVOLOG) 100 UNIT/ML injection   Subcutaneous   Inject 5-6 Units into the skin 3 (three) times daily as needed. For CBG over 180         . insulin glargine (LANTUS) 100 UNIT/ML injection   Subcutaneous   Inject 30 Units into the skin every evening. At 5pm         . metoCLOPramide (REGLAN) 10 MG tablet   Oral   Take 1 tablet (10 mg total) by mouth 4 (four) times daily -  before meals and at bedtime.   90 tablet   0   . Multiple Vitamin (MULTIVITAMIN WITH MINERALS) TABS   Oral   Take 1 tablet by mouth daily.           BP 159/91  Pulse 96  Temp(Src) 98.2 F (36.8 C) (Oral)  Resp 20  SpO2 100%  Physical Exam  Nursing note and  vitals reviewed. Constitutional: She is oriented to person, place, and time. She appears well-developed and well-nourished.  HENT:  Head: Normocephalic and atraumatic.  Eyes: Conjunctivae and EOM are normal. Pupils are equal, round, and reactive to light.  Neck: Normal range of motion. Neck supple.  Cardiovascular: Normal rate.   Pulmonary/Chest: Effort normal and breath sounds normal. No respiratory distress.  Abdominal: Soft. She exhibits no distension and no mass. There is tenderness. There is no rebound and no guarding.  Musculoskeletal: Normal range of motion. She exhibits no edema and no tenderness.  Neurological: She is alert and oriented to person, place, and time. She has normal reflexes.  Skin: Skin is warm and dry.  Psychiatric: She has a normal mood and affect.    ED Course  Procedures (including critical care time)  Labs Reviewed  COMPREHENSIVE METABOLIC PANEL - Abnormal; Notable for the following:    Glucose, Bld 283 (*)    Total Protein 8.7 (*)    All other components within normal limits  CBC WITH DIFFERENTIAL - Abnormal; Notable for the following:    Neutrophils Relative 84 (*)    Neutro Abs 8.2 (*)    Lymphocytes Relative 8 (*)    All other components within normal limits  LIPASE, BLOOD - Abnormal; Notable for the following:    Lipase 8 (*)    All other components within normal limits  URINALYSIS, MICROSCOPIC ONLY - Abnormal; Notable for the following:    APPearance CLOUDY (*)    Glucose, UA >1000 (*)    Hgb urine dipstick SMALL (*)    Ketones, ur 15 (*)    Protein, ur 30 (*)    Leukocytes, UA TRACE (*)    Bacteria, UA FEW (*)    Squamous Epithelial / LPF FEW (*)    All other components within normal limits  URINE CULTURE  POCT PREGNANCY, URINE   No results found.   No diagnosis found.    MDM  32yo female with intractable vomiting x 28 hours. Hyperglycemia with no dka.  Anion gap is 15.  Reglan and zofran in the ER with no relief.  She received 2  liters of ns in the ER and morphine for pain.  She will be admitted to the [facility] service.   Labs Reviewed  COMPREHENSIVE METABOLIC PANEL - Abnormal; Notable for the following:    Glucose, Bld 283 (*)    Total Protein 8.7 (*)    All other components within normal limits  CBC WITH DIFFERENTIAL - Abnormal; Notable for the following:    Neutrophils Relative 84 (*)  Neutro Abs 8.2 (*)    Lymphocytes Relative 8 (*)    All other components within normal limits  LIPASE, BLOOD - Abnormal; Notable for the following:    Lipase 8 (*)    All other components within normal limits  URINALYSIS, MICROSCOPIC ONLY - Abnormal; Notable for the following:    APPearance CLOUDY (*)    Glucose, UA >1000 (*)    Hgb urine dipstick SMALL (*)    Ketones, ur 15 (*)    Protein, ur 30 (*)    Leukocytes, UA TRACE (*)    Bacteria, UA FEW (*)    Squamous Epithelial / LPF FEW (*)    All other components within normal limits  GLUCOSE, CAPILLARY - Abnormal; Notable for the following:    Glucose-Capillary 231 (*)    All other components within normal limits  URINE CULTURE  POCT PREGNANCY, URINE          Remi Haggard, NP 03/11/12 2340

## 2012-03-11 NOTE — H&P (Signed)
Family Medicine Teaching [facility] Admission History and Physical Service Pager: [phone]  Patient name: [name] Medical record number: [id number] Date of birth: 04/14/1980 Age: 32 y.o. Gender: female  Primary Care Provider: Marena Chancy, MD  Chief Complaint: nausea and vomiting  Assessment and Plan: [name] is a 32 y.o. year old female with a history of T1DM and gastroparesis presenting with intractable nausea and vomiting, with some report of blood in her emesis.  # nausea/vomiting/abdominal pain - likely represents gastroparesis given normal labs and hx of type I diabetes with gastroparesis - antiemetics with zofran, pro-motility with reglan - hydrate with IVF (NS @ 150 cc/hr) - consider placing NG tube if vomiting does not improve with hydration and IV antiemetics - if clinically worsens, consider imaging of abdomen - UA not suggestive of UTI, f/u urine cx  # possible GI bleed - currently hemodynamically stable with normal CBC - will give IV protonix 80mg  BID - continue to monitor vital signs - recheck CBC in the morning  # type I diabetes: - anion gap is 15 currently, with 15 ketones in urine, likely representing very mild DKA (if any) - given NPO status will give only half of pt's home Lantus dose (takes 30 at home, will give 15) - moderate SSI with CBG checks q4h - most recent A1c 9.8 in Nov 2013, will recheck with AM labs - recheck BMET in AM  # hypertension: - BP mildly elevated now, but pt is not on any antihypertensives at home - will watch blood pressures overnight and if necessary, add medication for BP control  # hyperlipidemia: - not on any outpatient lipid altering medication - most recent LDL 93 in June 2013 - recommend outpatient f/u for possible addition of statin  # FEN/GI: - NPO for now - IVF with NS @150  cc/hr  # prophylaxis: - SCD's given possible active GIB - IV PPI  # dispo:  - pending clinical improvement  #  code status: did not discuss, will assume full code  History of Present Illness: [name] is a 32 y.o. year old female with a history of Type I diabetes presenting with one day of nausea and vomiting with bloody emesis.  Last night patient had just one beer and afterwards began to have vomiting and nausea. She has thrown up multiple times since then and has not been able to keep anything else down. The vomit has had bright red blood clots in it. She has had some abdominal pain across the middle of her abdomen. This started at the same time as the vomiting.   Has also had a runny nose that started yesterday before she started vomiting. No sick contacts. This has happened to her previously when her sugar was elevated (in the 300s-400s). She has checked her CBG at home today and gotten in the 100s.   She did urinate today. Denies fevers, dysuria, chest pain, shortness of breath. Has been told she has gastroparesis in the past. Does not take any regular medications for the gastroparesis (just takes reglan PRN, after she has been in the hospital usually).  Review Of Systems: Per HPI. Otherwise negative.  Patient Active Problem List  Diagnosis  . Diabetes mellitus type 1, uncontrolled, insulin dependent  . HYPERCHOLESTEROLEMIA  . TOBACCO USER  . TRIGGER FINGER, RIGHT THUMB  . Vaginitis  . Urinary tract infection  . Intractable nausea and vomiting   Past Medical History: Past Medical History  Diagnosis Date  . Hyperlipidemia   .  Hypertension   . Diabetes mellitus     A1c > 11. Patient does not check BG or take insulin regularly.  Dx at age 49.  . Tobacco abuse   . Incarceration     Was in jail for selling drugs - out Oct 2010.  [name] Renal disorder    Past Surgical History: Past Surgical History  Procedure Laterality Date  . Tubal ligation  2009   Home Medications: No current facility-administered medications on file prior to encounter.   Current Outpatient Prescriptions on File  Prior to Encounter  Medication Sig Dispense Refill  . [employer] Lancets Original MISC 1 Stick by Does not apply route 4 (four) times daily -  before meals and at bedtime.  120 each  12  . glucose blood (ACCU-CHEK AVIVA) test strip Use as instructed  100 each  12  . ibuprofen (ADVIL,MOTRIN) 800 MG tablet Take 800 mg by mouth every 8 (eight) hours as needed. For pain      . insulin aspart (NOVOLOG) 100 UNIT/ML injection Inject 5-6 Units into the skin 3 (three) times daily as needed. For CBG over 180      . insulin glargine (LANTUS) 100 UNIT/ML injection Inject 30 Units into the skin every evening. At 5pm      . metoCLOPramide (REGLAN) 10 MG tablet Take 1 tablet (10 mg total) by mouth 4 (four) times daily -  before meals and at bedtime.  90 tablet  0  . Multiple Vitamin (MULTIVITAMIN WITH MINERALS) TABS Take 1 tablet by mouth daily.        Social History: History  Substance Use Topics  . Smoking status: Current Every Day Smoker -- 0.30 packs/day    Types: Cigarettes  . Smokeless tobacco: Never Used     Comment: Quit 09/21/2010 while hospitalilzed/restarted again.   . Alcohol Use: 0.5 oz/week    1 drink(s) per week     Comment: Occasional.  Smokes 2-3 cigarettes per day. She smokes marijuana at least once per day. Did not smoke today.   For any additional social history documentation, please refer to relevant sections of EMR.  Family History: History reviewed. No pertinent family history.  Allergies: No Known Allergies  Physical Exam: BP 159/91  Pulse 96  Temp(Src) 98.2 F (36.8 C) (Oral)  Resp 20  SpO2 100% Exam: General: NAD, mildly ill appearing, vomits during exam HEENT: normocephalic. PERRL. Slightly dry mucous membranes. Cardiovascular: RRR, no murmur Respiratory: CTAB, normal respiratory effort Abdomen: hypoactive BS. Abdomen minimally diffusely tender to palpation. Soft, no rigidity. Extremities: no lower extremity edema Skin: no rashes noted Neuro: nonfocal, speech  intact  Labs and Imaging:  CBC:    Component Value Date/Time   WBC 9.7 03/11/2012 1848   HGB 12.6 03/11/2012 1848   HCT 37.8 03/11/2012 1848   PLT 319 03/11/2012 1848   MCV 88.5 03/11/2012 1848   NEUTROABS 8.2* 03/11/2012 1848   LYMPHSABS 0.8 03/11/2012 1848   MONOABS 0.7 03/11/2012 1848   EOSABS 0.0 03/11/2012 1848   BASOSABS 0.0 03/11/2012 1848   Comprehensive Metabolic Panel:    Component Value Date/Time   NA 142 03/11/2012 1848   K 3.9 03/11/2012 1848   CL 100 03/11/2012 1848   CO2 27 03/11/2012 1848   BUN 11 03/11/2012 1848   CREATININE 0.71 03/11/2012 1848   CREATININE 0.81 09/30/2010 1219   GLUCOSE 283* 03/11/2012 1848   CALCIUM 9.9 03/11/2012 1848   AST 24 03/11/2012 1848   ALT 18 03/11/2012 1848  ALKPHOS 66 03/11/2012 1848   BILITOT 0.9 03/11/2012 1848   PROT 8.7* 03/11/2012 1848   ALBUMIN 4.5 03/11/2012 1848   Lipase 8  Urine pregnancy negative  Urinalysis    Component Value Date/Time   COLORURINE YELLOW 03/11/2012 1751   APPEARANCEUR CLOUDY* 03/11/2012 1751   LABSPEC 1.028 03/11/2012 1751   PHURINE 5.5 03/11/2012 1751   GLUCOSEU >1000* 03/11/2012 1751   HGBUR SMALL* 03/11/2012 1751   HGBUR negative 02/24/2010 1357   BILIRUBINUR NEGATIVE 03/11/2012 1751   BILIRUBINUR NEG 01/16/2012 1510   KETONESUR 15* 03/11/2012 1751   PROTEINUR 30* 03/11/2012 1751   UROBILINOGEN 0.2 03/11/2012 1751   UROBILINOGEN 0.2 01/16/2012 1510   NITRITE NEGATIVE 03/11/2012 1751   NITRITE NEG 01/16/2012 1510   LEUKOCYTESUR TRACE* 03/11/2012 1751    Levert Feinstein, MD Family Medicine PGY-1   UPPER LEVEL ADDENDUM  I have seen and examined [facility] with Dr. Pollie Meyer and I agree with the above assessment/plan. I have reviewed all available data and have made any necessary changes to the above H&P.  Lailoni Baquera 03/12/2012, 4:04 AM

## 2012-03-11 NOTE — Patient Instructions (Addendum)
You need to go to Emergency Department to get evaluated now.    Nausea and Vomiting Nausea is a sick feeling that often comes before throwing up (vomiting). Vomiting is a reflex where stomach contents come out of your mouth. Vomiting can cause severe loss of body fluids (dehydration). Children and elderly adults can become dehydrated quickly, especially if they also have diarrhea. Nausea and vomiting are symptoms of a condition or disease. It is important to find the cause of your symptoms. CAUSES   Direct irritation of the stomach lining. This irritation can result from increased acid production (gastroesophageal reflux disease), infection, food poisoning, taking certain medicines (such as nonsteroidal anti-inflammatory drugs), alcohol use, or tobacco use.  Signals from the brain.These signals could be caused by a headache, heat exposure, an inner ear disturbance, increased pressure in the brain from injury, infection, a tumor, or a concussion, pain, emotional stimulus, or metabolic problems.  An obstruction in the gastrointestinal tract (bowel obstruction).  Illnesses such as diabetes, hepatitis, gallbladder problems, appendicitis, kidney problems, cancer, sepsis, atypical symptoms of a heart attack, or eating disorders.  Medical treatments such as chemotherapy and radiation.  Receiving medicine that makes you sleep (general anesthetic) during surgery. DIAGNOSIS Your caregiver may ask for tests to be done if the problems do not improve after a few days. Tests may also be done if symptoms are severe or if the reason for the nausea and vomiting is not clear. Tests may include:  Urine tests.  Blood tests.  Stool tests.  Cultures (to look for evidence of infection).  X-rays or other imaging studies. Test results can help your caregiver make decisions about treatment or the need for additional tests. TREATMENT You need to stay well hydrated. Drink frequently but in small amounts.You  may wish to drink water, sports drinks, clear broth, or eat frozen ice pops or gelatin dessert to help stay hydrated.When you eat, eating slowly may help prevent nausea.There are also some antinausea medicines that may help prevent nausea. HOME CARE INSTRUCTIONS   Take all medicine as directed by your caregiver.  If you do not have an appetite, do not force yourself to eat. However, you must continue to drink fluids.  If you have an appetite, eat a normal diet unless your caregiver tells you differently.  Eat a variety of complex carbohydrates (rice, wheat, potatoes, bread), lean meats, yogurt, fruits, and vegetables.  Avoid high-fat foods because they are more difficult to digest.  Drink enough water and fluids to keep your urine clear or pale yellow.  If you are dehydrated, ask your caregiver for specific rehydration instructions. Signs of dehydration may include:  Severe thirst.  Dry lips and mouth.  Dizziness.  Dark urine.  Decreasing urine frequency and amount.  Confusion.  Rapid breathing or pulse. SEEK IMMEDIATE MEDICAL CARE IF:   You have blood or brown flecks (like coffee grounds) in your vomit.  You have black or bloody stools.  You have a severe headache or stiff neck.  You are confused.  You have severe abdominal pain.  You have chest pain or trouble breathing.  You do not urinate at least once every 8 hours.  You develop cold or clammy skin.  You continue to vomit for longer than 24 to 48 hours.  You have a fever. MAKE SURE YOU:   Understand these instructions.  Will watch your condition.  Will get help right away if you are not doing well or get worse. Document Released: 01/16/2005 Document Revised: 04/10/2011  Document Reviewed: 06/15/2010 [facility] Patient Information 2013 [place], [place].

## 2012-03-11 NOTE — Progress Notes (Signed)
Family Medicine Office Visit Note   Subjective:   Patient ID: [name], female  DOB: 17-Nov-1980, 32 y.o.. MRN: [id number]   Pt with PHx of DMT1 that comes today complaining of intractable nausea and vomiting started last night after she drank a beer.  Pt reports about 20 episodes of bloody vomiting. She denies fever but has had chills, denies diarrhea or respiratory symptoms. No hx of sick contact.   Review of Systems:  Pt denies SOB, chest pain, palpitations, headaches, dizziness, numbness or weakness. No dysuria or frequency. No lower GI bleed.  Objective:   Physical Exam: Gen: ill appearing pt. Cooperative with examination no in acute distress.  HEENT: Moist mucous membranes  CV: Regular rate and rhythm, no murmurs rubs or gallops PULM: Clear to auscultation bilaterally. No wheezes/rales/rhonchi ABD: Soft, tender on right lower quadrant, no guarding. Normal BS. EXT: No edema Neuro: Alert and oriented x3. No focalization  Assessment & Plan:

## 2012-03-12 ENCOUNTER — Encounter (HOSPITAL_COMMUNITY): Payer: Self-pay

## 2012-03-12 ENCOUNTER — Ambulatory Visit: Payer: Medicaid Other | Admitting: Family Medicine

## 2012-03-12 LAB — BASIC METABOLIC PANEL
BUN: 11 mg/dL (ref 6–23)
CO2: 27 mEq/L (ref 19–32)
Chloride: 108 mEq/L (ref 96–112)
Creatinine, Ser: 0.67 mg/dL (ref 0.50–1.10)
Glucose, Bld: 86 mg/dL (ref 70–99)

## 2012-03-12 LAB — GLUCOSE, CAPILLARY
Glucose-Capillary: 117 mg/dL — ABNORMAL HIGH (ref 70–99)
Glucose-Capillary: 87 mg/dL (ref 70–99)
Glucose-Capillary: 120 mg/dL — ABNORMAL HIGH (ref 70–99)

## 2012-03-12 LAB — URINE CULTURE: Culture: NO GROWTH

## 2012-03-12 LAB — CBC
HCT: 35.8 % — ABNORMAL LOW (ref 36.0–46.0)
MCHC: 33.2 g/dL (ref 30.0–36.0)
MCV: 89.3 fL (ref 78.0–100.0)
RDW: 15.1 % (ref 11.5–15.5)

## 2012-03-12 LAB — WET PREP, GENITAL

## 2012-03-12 MED ORDER — ACETAMINOPHEN 325 MG PO TABS
650.0000 mg | ORAL_TABLET | Freq: Four times a day (QID) | ORAL | Status: DC | PRN
  Administered 2012-03-12: 650 mg via ORAL
  Filled 2012-03-12: qty 2

## 2012-03-12 MED ORDER — INFLUENZA VIRUS VACC SPLIT PF IM SUSP
0.5000 mL | INTRAMUSCULAR | Status: AC
  Administered 2012-03-13: 0.5 mL via INTRAMUSCULAR
  Filled 2012-03-12: qty 0.5

## 2012-03-12 MED ORDER — SODIUM CHLORIDE 0.9 % IV SOLN
80.0000 mg | Freq: Two times a day (BID) | INTRAVENOUS | Status: DC
  Administered 2012-03-12: 80 mg via INTRAVENOUS
  Filled 2012-03-12 (×3): qty 80

## 2012-03-12 MED ORDER — INSULIN ASPART 100 UNIT/ML ~~LOC~~ SOLN
0.0000 [IU] | SUBCUTANEOUS | Status: DC
  Administered 2012-03-12: 8 [IU] via SUBCUTANEOUS

## 2012-03-12 MED ORDER — PANTOPRAZOLE SODIUM 40 MG PO TBEC
80.0000 mg | DELAYED_RELEASE_TABLET | Freq: Two times a day (BID) | ORAL | Status: DC
  Administered 2012-03-12 – 2012-03-16 (×6): 80 mg via ORAL
  Filled 2012-03-12: qty 1
  Filled 2012-03-12 (×3): qty 2
  Filled 2012-03-12 (×6): qty 1

## 2012-03-12 MED ORDER — INSULIN GLARGINE 100 UNIT/ML ~~LOC~~ SOLN
10.0000 [IU] | Freq: Every day | SUBCUTANEOUS | Status: DC
  Administered 2012-03-12 – 2012-03-15 (×4): 10 [IU] via SUBCUTANEOUS

## 2012-03-12 MED ORDER — SODIUM CHLORIDE 0.9 % IV SOLN
INTRAVENOUS | Status: DC
  Administered 2012-03-12 – 2012-03-15 (×11): via INTRAVENOUS

## 2012-03-12 MED ORDER — TRAMADOL HCL 50 MG PO TABS
50.0000 mg | ORAL_TABLET | Freq: Four times a day (QID) | ORAL | Status: DC | PRN
  Administered 2012-03-12: 50 mg via ORAL
  Filled 2012-03-12 (×3): qty 1

## 2012-03-12 MED ORDER — INSULIN ASPART 100 UNIT/ML ~~LOC~~ SOLN
0.0000 [IU] | SUBCUTANEOUS | Status: DC
  Administered 2012-03-13: 2 [IU] via SUBCUTANEOUS
  Administered 2012-03-13: 3 [IU] via SUBCUTANEOUS
  Administered 2012-03-14: 2 [IU] via SUBCUTANEOUS
  Administered 2012-03-14: 21:00:00 via SUBCUTANEOUS
  Administered 2012-03-14: 1 [IU] via SUBCUTANEOUS
  Administered 2012-03-14: 2 [IU] via SUBCUTANEOUS
  Administered 2012-03-15: 9 [IU] via SUBCUTANEOUS
  Administered 2012-03-15: 2 [IU] via SUBCUTANEOUS
  Administered 2012-03-15: 5 [IU] via SUBCUTANEOUS
  Administered 2012-03-15: 1 [IU] via SUBCUTANEOUS
  Administered 2012-03-15: 2 [IU] via SUBCUTANEOUS
  Administered 2012-03-16: 5 [IU] via SUBCUTANEOUS
  Administered 2012-03-16: 7 [IU] via SUBCUTANEOUS

## 2012-03-12 MED ORDER — INSULIN GLARGINE 100 UNIT/ML ~~LOC~~ SOLN
15.0000 [IU] | Freq: Every day | SUBCUTANEOUS | Status: DC
  Administered 2012-03-12: 15 [IU] via SUBCUTANEOUS

## 2012-03-12 MED ORDER — PNEUMOCOCCAL VAC POLYVALENT 25 MCG/0.5ML IJ INJ
0.5000 mL | INJECTION | INTRAMUSCULAR | Status: AC
  Administered 2012-03-13: 0.5 mL via INTRAMUSCULAR
  Filled 2012-03-12: qty 0.5

## 2012-03-12 MED ORDER — ONDANSETRON HCL 4 MG PO TABS
4.0000 mg | ORAL_TABLET | Freq: Four times a day (QID) | ORAL | Status: DC | PRN
  Administered 2012-03-12: 4 mg via ORAL
  Filled 2012-03-12: qty 1

## 2012-03-12 MED ORDER — IOHEXOL 300 MG/ML  SOLN
25.0000 mL | INTRAMUSCULAR | Status: AC
  Administered 2012-03-12: 25 mL via ORAL

## 2012-03-12 MED ORDER — ONDANSETRON HCL 4 MG/2ML IJ SOLN
4.0000 mg | Freq: Four times a day (QID) | INTRAMUSCULAR | Status: DC | PRN
  Administered 2012-03-12 – 2012-03-13 (×3): 4 mg via INTRAVENOUS
  Filled 2012-03-12 (×3): qty 2

## 2012-03-12 MED ORDER — ALUM & MAG HYDROXIDE-SIMETH 200-200-20 MG/5ML PO SUSP
30.0000 mL | Freq: Four times a day (QID) | ORAL | Status: DC | PRN
  Filled 2012-03-12: qty 30

## 2012-03-12 MED ORDER — PROMETHAZINE HCL 25 MG/ML IJ SOLN
12.5000 mg | Freq: Four times a day (QID) | INTRAMUSCULAR | Status: DC | PRN
  Administered 2012-03-12 – 2012-03-14 (×5): 12.5 mg via INTRAVENOUS
  Filled 2012-03-12: qty 1
  Filled 2012-03-12: qty 2
  Filled 2012-03-12 (×4): qty 1

## 2012-03-12 MED ORDER — METOCLOPRAMIDE HCL 5 MG/ML IJ SOLN
10.0000 mg | Freq: Four times a day (QID) | INTRAMUSCULAR | Status: DC
  Administered 2012-03-12 – 2012-03-16 (×16): 10 mg via INTRAVENOUS
  Filled 2012-03-12 (×25): qty 2

## 2012-03-12 MED ORDER — ACETAMINOPHEN 650 MG RE SUPP: 650.0000 mg | Freq: Four times a day (QID) | RECTAL | Status: DC | PRN

## 2012-03-12 MED ORDER — INSULIN ASPART 100 UNIT/ML ~~LOC~~ SOLN: 0.0000 [IU] | Freq: Three times a day (TID) | SUBCUTANEOUS | Status: DC

## 2012-03-12 MED ORDER — METRONIDAZOLE 500 MG PO TABS
500.0000 mg | ORAL_TABLET | Freq: Two times a day (BID) | ORAL | Status: DC
  Administered 2012-03-13 – 2012-03-16 (×4): 500 mg via ORAL
  Filled 2012-03-12 (×9): qty 1

## 2012-03-12 NOTE — Progress Notes (Signed)
Inpatient Diabetes Program Recommendations  AACE/ADA: New Consensus Statement on Inpatient Glycemic Control (2013)  Target Ranges:  Prepandial:   less than 140 mg/dL      Peak postprandial:   less than 180 mg/dL (1-2 hours)      Critically ill patients:  140 - 180 mg/dL   Reason for Visit: Results for JAYE, SAAL (MRN [id number]) as of 03/12/2012 12:22  Ref. Range 03/12/2012 00:50 03/12/2012 01:49 03/12/2012 05:05 03/12/2012 07:57 03/12/2012 11:44  Glucose-Capillary Latest Range: 70-99 mg/dL 409 (H) 811 (H) 914 (H) 87 120 (H)     Note history of type 1 diabetes.  Currently patient is on Novolog moderate correction q 4 hours and Lantus was decreased today from 15 units to 10 units q HS.  Patient is on clear liquid diet.    Consider reducing Novolog correction to sensitve q 4 hours.  Once PO intake improves, patient will need Novolog meal coverage to cover carbohydrate intake.  Patient will also likely need more Lantus based on patients home regimen of Lantus 30 units q HS.

## 2012-03-12 NOTE — ED Provider Notes (Signed)
Medical screening examination/treatment/procedure(s) were performed by non-physician practitioner and as supervising physician I was immediately available for consultation/collaboration.    Vida Roller, MD 03/12/12 [phone]

## 2012-03-12 NOTE — H&P (Signed)
FMTS Attending Admission Note: Renold Don MD Personal pager:  [phone] FPTS Service Pager:  [phone]  I  have seen and examined this patient, reviewed their chart. I have discussed this patient with the resident. I agree with the resident's findings, assessment and care plan.  32 yo AAF with PMH significant for DM1 and suspected gastroparesis who presents with 1 day hx/o nausea and vomiting.  Multiple episodes of emesis triggered by alcohol intake.  Occasionally had clots and blood-tinged vomitus.  Denied any home hyperglycemia.  Found to have anion gap of 15, no documented ketonemia.  Started on IV fluid bolus plus repletement hydration.  Started on pro-motility agents and anti-emetics and admitted to [facility] for further observation.  Much improved overnight, no further vomiting.  Up and able to shower this AM.   Exam: Gen:  Alert, cooperative patient who appears stated age in no acute distress.  Vital signs reviewed. Cardiac:  Regular rate and rhythm without murmur auscultated.  Good S1/S2. Pulm:  Clear to auscultation bilaterally with good air movement.  No wheezes or rales noted.   Abdomen:  Minimal tenderness diffusely without guarding or rebound.  Soft/ND.  Good bowel sounds throughout  Imp/Plan: 1.  Intractable N/V - Improved. - Agree with REglan, Zofran - Continue IV hydration.   - Await AM Bmet.   - Initiate diet, if able to take PO consider DC later this afternoon - Consider further wu for gastroparesis on outpatient basis.    2.  Question of GI Bleed: - Hgb essentially normal at 11.9, and this is with volume replacement  - She has been as low 10 in past.   - Continue protonix, consider switch to PO.  3.  DM1:   - Agree with continuing Lantus.  - Watch CBGs as we attempt to restart diet - Awaiting A1C - Likely contributing to #1 above.

## 2012-03-12 NOTE — Progress Notes (Signed)
Pt orientation to unit, room and routine. Information packet given to patient/family. Admission INP armband ID verified with patient, and in place. Pt verbalizes an understanding of how to use the call bell and to call for help before getting out of bed.  Skin, clean-dry- intact without evidence of bruising, or skin tears.   No evidence of skin break down noted on exam.    Will cont to monitor and assist as needed.  Gilman Schmidt, RN

## 2012-03-12 NOTE — Progress Notes (Signed)
Utilization review completed.  

## 2012-03-12 NOTE — Progress Notes (Signed)
Family Medicine Teaching Service Daily Progress Note Service Page: [phone]  Subjective:  Feeling slightly better this am. Still some emesis but less often. States that it has some blood streaked in it, but had clots at home. ABd pain described as lower L and R quadrants, 7/10.   Objective: Temp:  [98.2 F (36.8 C)-99.2 F (37.3 C)] 98.4 F (36.9 C) (02/11 0509) Pulse Rate:  [84-101] 84 (02/11 0509) Resp:  [14-20] 16 (02/11 0509) BP: (122-165)/(78-105) 122/80 mmHg (02/11 0509) SpO2:  [99 %-100 %] 100 % (02/11 0509) Weight:  [144 lb 6.4 oz (65.499 kg)-144 lb 8 oz (65.545 kg)] 144 lb 8 oz (65.545 kg) (02/11 0154)  Exam: General: NAD, mildly ill appearing HEENT: normocephalic. PERRL. MMM.  Cardiovascular: RRR, no murmur  Respiratory: CTAB, normal respiratory effort  Abdomen: Nl BS. Abdomen minimally tender to palpation of LUQ. Soft, no rigidity.  Extremities: no lower extremity edema  Neuro: nonfocal, speech intact  I have reviewed the patient's medications, labs, imaging, and diagnostic testing.  Notable results are summarized below.  CBC BMET   Recent Labs Lab 03/11/12 1848 03/12/12 0650  WBC 9.7 13.6*  HGB 12.6 11.9*  HCT 37.8 35.8*  PLT 319 302    Recent Labs Lab 03/11/12 1848 03/12/12 0650  NA 142 147*  K 3.9 3.4*  CL 100 108  CO2 27 27  BUN 11 11  CREATININE 0.71 0.67  GLUCOSE 283* 86  CALCIUM 9.9 9.4     U Preg negative   03/11/2012 17:51  Color, Urine YELLOW  APPearance CLOUDY (A)  Specific Gravity, Urine 1.028  pH 5.5  Glucose >1000 (A)  Bilirubin Urine NEGATIVE  Ketones, ur 15 (A)  Protein 30 (A)  Urobilinogen, UA 0.2  Nitrite NEGATIVE  Leukocytes, UA TRACE (A)  Hgb urine dipstick SMALL (A)  Urine-Other AMORPHOUS URATES/PHOSPHATES  WBC, UA 7-10  RBC / HPF 3-6  Squamous Epithelial / LPF FEW (A)  Bacteria, UA FEW (A)    Plan: [name] is a 32 y.o. year old female with a history of T1DM and gastroparesis presenting with  intractable nausea and vomiting, with some report of blood in her emesis.   nausea/vomiting/abdominal pain - improving - likely represents gastroparesis given normal labs and hx of type I diabetes with gastroparesis, with elevated WBC will consider viral gastro also - antiemetics with zofran, pro-motility with reglan  - IV NS @ 150 cc/hr  - consider placing NG tube if vomiting does not improve with hydration and IV antiemetics  - if clinically worsens, consider imaging of abdomen  - UA not suggestive of UTI, f/u urine cx   possible GI bleed  - Hgb down very slightly from 12.6 to 11.9 - IV protonix 80mg  BID changed to PO now  type I diabetes:  - anion gap was 15 now closed at 12  - given NPO status will give only half of pt's home Lantus dose (takes 30 at home, will give 15)  - moderate SSI with  q4h CBG checks - range 87-261 - most recent A1c 9.8 in Nov 2013, re-check pending  hypertension:  - 122-165/80-105  - No Hx of HTN, will observe and need OP F/u  hyperlipidemia:  - not on any outpatient lipid altering medication  - most recent LDL 93 in June 2013  - recommend outpatient f/u for possible addition of statin  FEN/GI: Clears, IVNS at 150 mL/Hr PPx: SCDs, no heparin as possible GI bleed Dispo: Tele for now, Home if  tolerates PO Code Status: Full  Kevin Fenton, MD 03/12/2012, 9:32 AM

## 2012-03-12 NOTE — Progress Notes (Signed)
Interval note  Patient still feeling very nauseous and has taken very little PO fluids. Denies diarrhea. She does endorse some foul smelling vaginal discharge but denies burning or itching.   O: G: ill appearing, no acute distress GU: Bimanual exam performed with tenderness midline over a normal feeling uterus. No palpable masses in the adnexa but mild tenderness to palpation.   A/P 32 y/o F with significant PMH of DM1 here with intractable nausea and vomiting.    We consider that the nausea and vomiting are most likely due gastroparesis and likely viral gastro but we cannot exclude obstruction, partial ileus, or structural issue.  - Proceed with CT abd pelvis - Wet prep performed - f/u results - add IV phenergan for better nausea control.   Murtis Sink, MD 03/12/2012 2:17 PM

## 2012-03-12 NOTE — Progress Notes (Signed)
FMTS Attending Daily Note: Rachel Noblett MD [phone] pager office [phone] I  have seen and examined this patient, reviewed their chart. I have discussed this patient with the resident. I agree with the resident's findings, assessment and care plan. 

## 2012-03-13 ENCOUNTER — Inpatient Hospital Stay (HOSPITAL_COMMUNITY): Payer: Medicaid Other

## 2012-03-13 LAB — CBC
MCH: 29.8 pg (ref 26.0–34.0)
MCHC: 33.3 g/dL (ref 30.0–36.0)
MCV: 89.4 fL (ref 78.0–100.0)
Platelets: 276 10*3/uL (ref 150–400)

## 2012-03-13 LAB — GLUCOSE, CAPILLARY
Glucose-Capillary: 163 mg/dL — ABNORMAL HIGH (ref 70–99)
Glucose-Capillary: 123 mg/dL — ABNORMAL HIGH (ref 70–99)
Glucose-Capillary: 170 mg/dL — ABNORMAL HIGH (ref 70–99)
Glucose-Capillary: 97 mg/dL (ref 70–99)
Glucose-Capillary: 226 mg/dL — ABNORMAL HIGH (ref 70–99)

## 2012-03-13 LAB — BASIC METABOLIC PANEL
Calcium: 9.1 mg/dL (ref 8.4–10.5)
Creatinine, Ser: 0.74 mg/dL (ref 0.50–1.10)
GFR calc non Af Amer: >90 mL/min (ref >90)
Glucose, Bld: 112 mg/dL — ABNORMAL HIGH (ref 70–99)
Sodium: 142 mEq/L (ref 135–145)

## 2012-03-13 MED ORDER — METOPROLOL TARTRATE 1 MG/ML IV SOLN
5.0000 mg | Freq: Four times a day (QID) | INTRAVENOUS | Status: DC
  Administered 2012-03-14 (×2): 5 mg via INTRAVENOUS
  Filled 2012-03-13 (×6): qty 5

## 2012-03-13 MED ORDER — BOOST / RESOURCE BREEZE PO LIQD
1.0000 | Freq: Three times a day (TID) | ORAL | Status: DC
  Administered 2012-03-15 – 2012-03-16 (×3): 1 via ORAL

## 2012-03-13 MED ORDER — MORPHINE SULFATE 2 MG/ML IJ SOLN
1.0000 mg | INTRAMUSCULAR | Status: DC | PRN
  Administered 2012-03-13 – 2012-03-15 (×6): 1 mg via INTRAVENOUS
  Filled 2012-03-13 (×6): qty 1

## 2012-03-13 MED ORDER — ONDANSETRON HCL 4 MG/2ML IJ SOLN
4.0000 mg | Freq: Four times a day (QID) | INTRAMUSCULAR | Status: DC
  Administered 2012-03-13 – 2012-03-15 (×8): 4 mg via INTRAVENOUS
  Filled 2012-03-13 (×10): qty 2

## 2012-03-13 NOTE — Progress Notes (Signed)
Patient continues to vomit. Vomited clear emesis x 4 during the night. Unable to drink CT contrast. Patient remains lethargic, withdrawn, and continues to have flat affect. States "I do not want the NG tube again." Will continue to monitor. Gilman Schmidt

## 2012-03-13 NOTE — Progress Notes (Signed)
Family Medicine Teaching Service Daily Progress Note Service Page: [phone]  Subjective:  Still vomiting and abd still tender. Pulled out her NG and refuses to have it placed again.   Objective: Temp:  [98 F (36.7 C)-98.5 F (36.9 C)] 98.4 F (36.9 C) (02/12 0411) Pulse Rate:  [88-96] 88 (02/12 0411) Resp:  [17-20] 18 (02/12 0411) BP: (138-167)/(80-101) 143/80 mmHg (02/12 0411) SpO2:  [98 %-100 %] 100 % (02/12 0411) Weight:  [146 lb 12.8 oz (66.588 kg)] 146 lb 12.8 oz (66.588 kg) (02/11 2100)  Exam: General: NAD, mildly ill appearing HEENT: normocephalic. PERRL. MMM.  Cardiovascular: RRR, no murmur  Respiratory: CTAB, normal respiratory effort  Abdomen: Nl BS. Abdomen minimally tender to palpation of LUQ. Soft, no rigidity. No CVA tenderness Extremities: no lower extremity edema  Neuro: nonfocal, speech intact  I have reviewed the patient's medications, labs, imaging, and diagnostic testing.  Notable results are summarized below.  CBC BMET   Recent Labs Lab 03/11/12 1848 03/12/12 0650  WBC 9.7 13.6*  HGB 12.6 11.9*  HCT 37.8 35.8*  PLT 319 302    Recent Labs Lab 03/11/12 1848 03/12/12 0650  NA 142 147*  K 3.9 3.4*  CL 100 108  CO2 27 27  BUN 11 11  CREATININE 0.71 0.67  GLUCOSE 283* 86  CALCIUM 9.9 9.4     U Preg negative   03/11/2012 17:51  Color, Urine YELLOW  APPearance CLOUDY (A)  Specific Gravity, Urine 1.028  pH 5.5  Glucose >1000 (A)  Bilirubin Urine NEGATIVE  Ketones, ur 15 (A)  Protein 30 (A)  Urobilinogen, UA 0.2  Nitrite NEGATIVE  Leukocytes, UA TRACE (A)  Hgb urine dipstick SMALL (A)  Urine-Other AMORPHOUS URATES/PHOSPHATES  WBC, UA 7-10  RBC / HPF 3-6  Squamous Epithelial / LPF FEW (A)  Bacteria, UA FEW (A)   Wet prep: + trich, no Clue cells, no candidia, rare WBC  Plan: [name] is a 32 y.o. year old female with a history of T1DM and gastroparesis presenting with intractable nausea and vomiting, with some report of  blood in her emesis.   nausea/vomiting/abdominal pain - improving but continued - likely represents gastroparesis given normal labs and hx of type I diabetes with gastroparesis, with elevated WBC will consider viral gastro. 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  - IV NS @ 150 cc/hr  - did not tolerate NG tube, Cant tolerate oral contrast, will proceed with CT scan without oral contrast vs pelvic US/abd [place],   - UA not suggestive of UTI, f/u urine cx  - Urine GCC  possible GI bleed  - Hgb down very slightly from 12.6 to 11.9 - PO protonix 80 BID  type I diabetes:  - anion gap was 15 now closed at 12, pending today.  - given NPO status will give only half of pt's home Lantus dose (takes 30 at home, will give 15)  - moderate SSI with  q4h CBG checks - range 87-170 - most recent A1c 9.8 in Nov 2013, 10.8 0n re-check  hypertension:  - 143/80 - No Hx of HTN, will observe and need OP F/u  hyperlipidemia:  - not on any outpatient lipid altering medication  - most recent LDL 93 in June 2013  - recommend outpatient f/u for possible addition of statin  FEN/GI: Clears, IVNS at 150 mL/Hr PPx: SCDs, no heparin as possible GI bleed  Dispo: Tele for now, hom epending further workup Code Status: Full  Kevin Fenton, MD 03/13/2012, 9:17 AM

## 2012-03-13 NOTE — Progress Notes (Addendum)
Pt's blood pressure is trending up. Currently blood pressure taken manually is 174/90. HR 114. No PRN or scheduled medications for blood pressure at this time. Pt is exhibiting 7 out of 10 abdominal discomfort and is still vomiting clear emesis. MD made aware. Per telephone call back, MD will add blood pressure and IV pain medication. Will continue to monitor. [name]

## 2012-03-13 NOTE — Progress Notes (Signed)
FMTS Attending Daily Note: Shareta Fishbaugh MD [phone] pager office [phone] I  have seen and examined this patient, reviewed their chart. I have discussed this patient with the resident. I agree with the resident's findings, assessment and care plan. 

## 2012-03-13 NOTE — Progress Notes (Addendum)
NG tube successfully inserted, however pt remained noncompliant and pulled out the NG tube with her hands. Pt refuses to have a reinsertion and has agreed to drink the CT contrast. Will continue to monitor.

## 2012-03-13 NOTE — Progress Notes (Addendum)
INITIAL NUTRITION ASSESSMENT  DOCUMENTATION CODES Per approved criteria  -Not Applicable   INTERVENTION:  Resource Breeze 3 times daily (250 kcals, 9 gm protein per 8 fl oz carton) RD to follow for nutrition care plan  NUTRITION DIAGNOSIS: Inadequate oral intake related to nausea & vomiting as evidenced by PO intake 0%  Goal: Oral intake with meals & supplements to meet >/= 90% of estimated nutrition needs  Monitor:  PO & supplemental intake, weight, labs, I/O's  Reason for Assessment: Malnutrition Screening Tool Report  32 y.o. female  Admitting Dx: Intractable nausea and vomiting  ASSESSMENT: Patient with hx of DM type 1 and suspected gastroparesis who presents with 1 day hx/o nausea and vomiting ---> multiple episodes of emesis triggered by alcohol intake; started on IV fluid bolus, pro-motility agents and anti-emetics ---> admitted for further observation.  RD unable to obtain much nutrition hx from patient; not very responsive to RD questions; did state her appetite was decreased PTA, + N/V; patient suspects some weight loss, however, unable to quantify; pulled NGT out; advanced to Clear Liquids this AM; amenable to Resource Breeze supplement ---> RD to order.  Height: Ht Readings from Last 1 Encounters:  01/16/12 5\' 7"  (1.702 m)    Weight: Wt Readings from Last 1 Encounters:  03/12/12 146 lb 12.8 oz (66.588 kg)    Ideal Body Weight: 135 lb  % Ideal Body Weight: 108%  Wt Readings from Last 10 Encounters:  03/12/12 146 lb 12.8 oz (66.588 kg)  03/11/12 144 lb 6.4 oz (65.499 kg)  01/16/12 152 lb (68.947 kg)  12/19/11 138 lb 11.2 oz (62.914 kg)  10/18/11 152 lb (68.947 kg)  08/07/11 151 lb 11.2 oz (68.811 kg)  07/02/11 150 lb 9.6 oz (68.312 kg)  05/12/11 145 lb 3.2 oz (65.862 kg)  02/17/11 153 lb 11.2 oz (69.718 kg)  11/23/10 153 lb (69.4 kg)    Usual Body Weight: 152 lb  % Usual Body Weight: 96%  BMI:  Body mass index is 22.99 kg/(m^2).  Estimated  Nutritional Needs: Kcal: 1700-1900 Protein: 80-90 gm Fluid: 1.7-1.9 L  Skin: Intact  Diet Order: Clear Liquid  EDUCATION NEEDS: -Education needs addressed ---> Gastroparesis Nutrition Therapy handouts provided    Intake/Output Summary (Last 24 hours) at 03/13/12 1108 Last data filed at 03/13/12 0412  Gross per 24 hour  Intake 3402.5 ml  Output   1400 ml  Net 2002.5 ml    Last BM: 2/10  Labs:   Recent Labs Lab 03/11/12 1848 03/12/12 0650  NA 142 147*  K 3.9 3.4*  CL 100 108  CO2 27 27  BUN 11 11  CREATININE 0.71 0.67  CALCIUM 9.9 9.4  GLUCOSE 283* 86    CBG (last 3)   Recent Labs  03/13/12 0125 03/13/12 0408 03/13/12 0804  GLUCAP 163* 123* 170*    Scheduled Meds: . insulin aspart  0-9 Units Subcutaneous Q4H  . insulin glargine  10 Units Subcutaneous QHS  . metoCLOPramide (REGLAN) injection  10 mg Intravenous Q6H  . metroNIDAZOLE  500 mg Oral Q12H  . pantoprazole  80 mg Oral BID    Continuous Infusions: . sodium chloride 150 mL/hr at 03/13/12 0107    Past Medical History  Diagnosis Date  . Hyperlipidemia   . Hypertension   . Diabetes mellitus     A1c > 11. Patient does not check BG or take insulin regularly.  Dx at age 28.  . Tobacco abuse   . Incarceration  Was in jail for selling drugs - out Oct 2010.  [name] Renal disorder     Past Surgical History  Procedure Laterality Date  . Tubal ligation  2009    Maureen Chatters, [place], LDN Pager #: [phone] After-Hours Pager #: [phone]

## 2012-03-14 LAB — GLUCOSE, CAPILLARY
Glucose-Capillary: 105 mg/dL — ABNORMAL HIGH (ref 70–99)
Glucose-Capillary: 142 mg/dL — ABNORMAL HIGH (ref 70–99)
Glucose-Capillary: 159 mg/dL — ABNORMAL HIGH (ref 70–99)
Glucose-Capillary: 180 mg/dL — ABNORMAL HIGH (ref 70–99)
Glucose-Capillary: 88 mg/dL (ref 70–99)

## 2012-03-14 LAB — BASIC METABOLIC PANEL
BUN: 6 mg/dL (ref 6–23)
Chloride: 101 mEq/L (ref 96–112)
Creatinine, Ser: 0.77 mg/dL (ref 0.50–1.10)
GFR calc Af Amer: >90 mL/min (ref >90)
Glucose, Bld: 127 mg/dL — ABNORMAL HIGH (ref 70–99)
Potassium: 3 mEq/L — ABNORMAL LOW (ref 3.5–5.1)

## 2012-03-14 LAB — CBC
HCT: 37.6 % (ref 36.0–46.0)
Hemoglobin: 12.7 g/dL (ref 12.0–15.0)
MCV: 88.5 fL (ref 78.0–100.0)
RDW: 14.4 % (ref 11.5–15.5)
WBC: 12 10*3/uL — ABNORMAL HIGH (ref 4.0–10.5)

## 2012-03-14 MED ORDER — BLISTEX EX OINT
TOPICAL_OINTMENT | CUTANEOUS | Status: DC | PRN
  Administered 2012-03-14: 16:00:00 via TOPICAL
  Filled 2012-03-14: qty 10

## 2012-03-14 MED ORDER — POTASSIUM CHLORIDE CRYS ER 20 MEQ PO TBCR
40.0000 meq | EXTENDED_RELEASE_TABLET | Freq: Two times a day (BID) | ORAL | Status: DC
Start: 2012-03-14 — End: 2012-03-14
  Filled 2012-03-14: qty 2

## 2012-03-14 MED ORDER — IOHEXOL 300 MG/ML  SOLN
25.0000 mL | INTRAMUSCULAR | Status: AC
  Administered 2012-03-14 (×2): 25 mL via ORAL

## 2012-03-14 MED ORDER — POLYETHYLENE GLYCOL 3350 17 G PO PACK
17.0000 g | PACK | Freq: Every day | ORAL | Status: DC
  Administered 2012-03-15 – 2012-03-16 (×2): 17 g via ORAL
  Filled 2012-03-14 (×3): qty 1

## 2012-03-14 MED ORDER — LORAZEPAM 2 MG/ML IJ SOLN: 1.0000 mg | Freq: Four times a day (QID) | INTRAMUSCULAR | Status: DC | PRN

## 2012-03-14 MED ORDER — LORAZEPAM 2 MG/ML IJ SOLN
0.5000 mg | Freq: Four times a day (QID) | INTRAMUSCULAR | Status: DC | PRN
  Administered 2012-03-14 – 2012-03-15 (×2): 0.5 mg via INTRAVENOUS
  Filled 2012-03-14 (×2): qty 1

## 2012-03-14 MED ORDER — LISINOPRIL 5 MG PO TABS
5.0000 mg | ORAL_TABLET | Freq: Every day | ORAL | Status: DC
  Administered 2012-03-15 – 2012-03-16 (×2): 5 mg via ORAL
  Filled 2012-03-14 (×3): qty 1

## 2012-03-14 MED ORDER — POTASSIUM CHLORIDE 2 MEQ/ML IV SOLN
INTRAVENOUS | Status: AC
  Administered 2012-03-14: 16:00:00 via INTRAVENOUS
  Filled 2012-03-14 (×2): qty 1000

## 2012-03-14 NOTE — Progress Notes (Signed)
FMTS Attending Daily Note: Denny Levy MD [phone] pager office [phone] I  have seen and examined this patient, reviewed their chart. I have discussed this patient with the resident. I agree with the resident's findings, assessment and care plan.Her emesis is really mostly some spitting up. There has been n hematemesis. Will try some benzodiazepine for nausea control. Unableto complete the CT scan secondary to her inability to drink contrast material. She is steadfastly refusing NGT again.I think if we can control her nausea, her spitting up will resolve.

## 2012-03-14 NOTE — Progress Notes (Signed)
Family Medicine Teaching Service Daily Progress Note Service Page: [phone]  Subjective:  Still vomiting slightly improved, still some continued abd pain. Feels like she can try CT contrast again to avoid NG tube.  Objective: Temp:  [98.1 F (36.7 C)-99 F (37.2 C)] 98.8 F (37.1 C) (02/13 0506) Pulse Rate:  [92-113] 96 (02/13 0506) Resp:  [18-20] 18 (02/13 0506) BP: (130-176)/(81-114) 156/87 mmHg (02/13 0506) SpO2:  [92 %-100 %] 100 % (02/13 0506)  Exam: General: NAD, mildly ill appearing HEENT: normocephalic. PERRL. MMM.  Cardiovascular: RRR, no murmur  Respiratory: CTAB, normal respiratory effort  Abdomen: Nl BS. Abdomen minimally tender to palpation of LUQ. Soft, no rigidity. No CVA tenderness Extremities: no lower extremity edema  Neuro: nonfocal, speech intact  I have reviewed the patient's medications, labs, imaging, and diagnostic testing.  Notable results are summarized below.  CBC BMET   Recent Labs Lab 03/12/12 0650 03/13/12 0941 03/14/12 0600  WBC 13.6* 8.5 12.0*  HGB 11.9* 12.1 12.7  HCT 35.8* 36.3 37.6  PLT 302 276 294    Recent Labs Lab 03/12/12 0650 03/13/12 0941 03/14/12 0600  NA 147* 142 140  K 3.4* 3.3* 3.0*  CL 108 104 101  CO2 27 26 27   BUN 11 7 6   CREATININE 0.67 0.74 0.77  GLUCOSE 86 112* 127*  CALCIUM 9.4 9.1 8.9     U Preg negative   03/11/2012 17:51  Color, Urine YELLOW  APPearance CLOUDY (A)  Specific Gravity, Urine 1.028  pH 5.5  Glucose >1000 (A)  Bilirubin Urine NEGATIVE  Ketones, ur 15 (A)  Protein 30 (A)  Urobilinogen, UA 0.2  Nitrite NEGATIVE  Leukocytes, UA TRACE (A)  Hgb urine dipstick SMALL (A)  Urine-Other AMORPHOUS URATES/PHOSPHATES  WBC, UA 7-10  RBC / HPF 3-6  Squamous Epithelial / LPF FEW (A)  Bacteria, UA FEW (A)   Wet prep: + trich, no Clue cells, no candidia, rare WBC  Transvaginal US 03/13/2012 IMPRESSION:  Normal study. No evidence of pelvic mass or other significant  abnormality.  [place] Abd  03/13/2012 IMPRESSION:  Negative abdominal ultrasound.  Plan: TYNESIA HARRAL is a 32 y.o. year old female with a history of T1DM and gastroparesis presenting with intractable nausea and vomiting, with some report of blood in her emesis.   nausea/vomiting/abdominal pain - improved slightly but continued, now refusing PO's - likely represents gastroparesis given normal labs and hx of type I diabetes with gastroparesis, with elevated WBC will consider viral gastro. 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  - IV NS @ 150 cc/hr  - did not tolerate NG tube, Can't tolerate oral contrast, will proceed with CT scan without oral contrast vs pelvic US/abd [place],   - UA not suggestive of UTI, f/u urine cx  - Urine GCC pending - Will attempt CT again, pt wants to try oral contrast, will strongly consider dobhoff to admisniter CT contrast and relive nausea although patinet not open to this.   possible GI bleed  - Hgb stable - PO protonix 80 BID  Trichomonas - possibly explains some suprapubic pain,  - metronidazole 500 BID, day 3  type I diabetes:  - anion gap was 15 now closed  - given NPO status will give only half of pt's home Lantus dose (takes 30 at home, will give 15)  - moderate SSI with  q4h CBG checks - range 97-226 - most recent  A1c 9.8 in Nov 2013, 10.8 0n re-check  hypertension:  - up to 176/114 overnight,  - added IV lopressor. Will change to PO ACEi and PO BB if needed for pulse  hyperlipidemia:  - not on any outpatient lipid altering medication  - most recent LDL 93 in June 2013  - recommend outpatient f/u for possible addition of statin  FEN/GI: Clears, IVNS at 150 mL/Hr PPx: SCDs, no heparin as possible GI bleed Dispo: Tele for now, hom epending further workup Code Status: Full  Kevin Fenton, MD 03/14/2012, 8:24 AM

## 2012-03-15 ENCOUNTER — Inpatient Hospital Stay (HOSPITAL_COMMUNITY): Payer: Medicaid Other

## 2012-03-15 ENCOUNTER — Encounter (HOSPITAL_COMMUNITY): Payer: Self-pay | Admitting: Radiology

## 2012-03-15 LAB — BASIC METABOLIC PANEL
Calcium: 9 mg/dL (ref 8.4–10.5)
GFR calc Af Amer: >90 mL/min (ref >90)
GFR calc non Af Amer: >90 mL/min (ref >90)
Glucose, Bld: 139 mg/dL — ABNORMAL HIGH (ref 70–99)
Potassium: 3 mEq/L — ABNORMAL LOW (ref 3.5–5.1)
Sodium: 138 mEq/L (ref 135–145)

## 2012-03-15 LAB — GLUCOSE, CAPILLARY
Glucose-Capillary: 113 mg/dL — ABNORMAL HIGH (ref 70–99)
Glucose-Capillary: 276 mg/dL — ABNORMAL HIGH (ref 70–99)

## 2012-03-15 LAB — CBC
MCH: 30.1 pg (ref 26.0–34.0)
MCHC: 34.6 g/dL (ref 30.0–36.0)
Platelets: 274 10*3/uL (ref 150–400)
RDW: 13.7 % (ref 11.5–15.5)

## 2012-03-15 LAB — GC/CHLAMYDIA PROBE AMP: GC Probe RNA: NEGATIVE

## 2012-03-15 LAB — MAGNESIUM: Magnesium: 1.7 mg/dL (ref 1.5–2.5)

## 2012-03-15 LAB — SEDIMENTATION RATE: Sed Rate: 26 mm/hr — ABNORMAL HIGH (ref 0–22)

## 2012-03-15 MED ORDER — IOHEXOL 300 MG/ML  SOLN
25.0000 mL | INTRAMUSCULAR | Status: AC
  Administered 2012-03-15 (×2): 25 mL via ORAL

## 2012-03-15 MED ORDER — IOHEXOL 300 MG/ML  SOLN: 25.0000 mL | INTRAMUSCULAR | Status: AC

## 2012-03-15 MED ORDER — IOHEXOL 300 MG/ML  SOLN
80.0000 mL | Freq: Once | INTRAMUSCULAR | Status: AC | PRN
  Administered 2012-03-15: 80 mL via INTRAVENOUS

## 2012-03-15 NOTE — Progress Notes (Signed)
FMTS Attending Daily Note: Rachel Burkes MD [phone] pager office [phone] I  have seen and examined this patient, reviewed their chart. I have discussed this patient with the resident. I agree with the resident's findings, assessment and care plan. 

## 2012-03-15 NOTE — Progress Notes (Signed)
Family Medicine Teaching Service Daily Progress Note Service Page: [phone]  Subjective:  States has not vomited since last night. Some blood tinge to it. When discussing options for today patient continues to refuse NG tube and post-pyloric tube, though states she will attempt to try the contrast again. States she won't try but so much of it if she starts to feel sick again with it. Asks if she is going to see the doctor she's been seeing the last couple of days, when told Dr. Ermalinda Memos would not be seeing her today, the patient expresses displeasure and states she wants to keep seeing the same doctor she's been seeing.  Objective: Temp:  [98.6 F (37 C)-99.3 F (37.4 C)] 98.7 F (37.1 C) (02/14 0411) Pulse Rate:  [86-93] 87 (02/14 0411) Resp:  [16-20] 16 (02/14 0411) BP: (141-168)/(78-98) 149/78 mmHg (02/14 0411) SpO2:  [97 %-100 %] 100 % (02/14 0411) Weight:  [144 lb 13.5 oz (65.7 kg)] 144 lb 13.5 oz (65.7 kg) (02/13 1252)  Exam: General: NAD, sitting up in bed Cardiovascular: RRR, no mrg  Respiratory: CTAB, normal respiratory effort  Abdomen: Nl BS. Abdomen minimally tender to palpation of epigastrium. Soft, no rigidity. Extremities: no lower extremity edema  Neuro: nonfocal, speech intact  I have reviewed the patient's medications, labs, imaging, and diagnostic testing.  Notable results are summarized below.  CBC BMET   Recent Labs Lab 03/13/12 0941 03/14/12 0600 03/15/12 0425  WBC 8.5 12.0* 7.4  HGB 12.1 12.7 13.5  HCT 36.3 37.6 39.0  PLT 276 294 274    Recent Labs Lab 03/13/12 0941 03/14/12 0600 03/15/12 0425  NA 142 140 138  K 3.3* 3.0* 3.0*  CL 104 101 98  CO2 26 27 28   BUN 7 6 7   CREATININE 0.74 0.77 0.77  GLUCOSE 112* 127* 139*  CALCIUM 9.1 8.9 9.0     U Preg negative   03/11/2012 17:51  Color, Urine YELLOW  APPearance CLOUDY (A)  Specific Gravity, Urine 1.028  pH 5.5  Glucose >1000 (A)  Bilirubin Urine NEGATIVE  Ketones, ur 15 (A)  Protein 30  (A)  Urobilinogen, UA 0.2  Nitrite NEGATIVE  Leukocytes, UA TRACE (A)  Hgb urine dipstick SMALL (A)  Urine-Other AMORPHOUS URATES/PHOSPHATES  WBC, UA 7-10  RBC / HPF 3-6  Squamous Epithelial / LPF FEW (A)  Bacteria, UA FEW (A)   Wet prep: + trich, no Clue cells, no candidia, rare WBC  Transvaginal US 03/13/2012 IMPRESSION:  Normal study. No evidence of pelvic mass or other significant  abnormality.  [place] Abd 03/13/2012 IMPRESSION:  Negative abdominal ultrasound.  Plan: [name] is a 32 y.o. year old female with a history of T1DM and gastroparesis presenting with intractable nausea and vomiting, with some report of blood in her emesis.   nausea/vomiting/abdominal pain - improved slightly but continued - likely represents gastroparesis given normal labs and hx of type I diabetes with gastroparesis, with elevated WBC considered viral gastro, though WBC returned to normal and patient continues to have nausea and intermittent vomiting. Less likely related to structural issue given normal abd/pelvic US, but would like additional studies to further delineate cause, but patient continues to refuse the prep required for these scans. - antiemetics scheduled zofran, pro-motility with reglan, PRN phenergan also. Added ativan yesterday for antiemetic. - IV NS @ 150 cc/hr  - did not tolerate NG tube, continues to refuse replacement. Additionally refuses placement of post-pyloric tube. States will try contrast again. - will premedicate today  with zofran and ativan prior to administration of contrast-if unable to tolerate patient will likely be discharged as no further work-up is possible with refusal of care  - UA not suggestive of UTI, UCx negative  - Urine GCC pending - Dr. Ermalinda Memos concerned that this may be related to lupus given history of potential malar rash-ANA and ESR ordered-will f/u  possible GI bleed  - Hgb stable at 13.5 - PO protonix 80 BID  Trichomonas - possibly explains  some suprapubic pain,  - metronidazole 500 BID, day 3, as refused dose yesterday  type I diabetes:  - anion gap was 15 now closed  - given clear liquids and poor PO intake will give only half of pt's home Lantus dose (takes 30 at home, will give 15)  - moderate SSI with  q4h CBG checks - range 113-179 - most recent A1c 9.8 in Nov 2013, 10.8 on re-check  hypertension:  - up to 168/98 overnight,  - added lisinopril yesterday, though patient refused this  hyperlipidemia:  - not on any outpatient lipid altering medication  - most recent LDL 93 in June 2013  - recommend outpatient f/u for possible addition of statin  FEN/GI: Clears, IVNS at 150 mL/Hr PPx: SCDs, no heparin as possible GI bleed Dispo: Tele for now, home pending further workup Code Status: Full  Marikay Alar, MD 03/15/2012, 7:27 AM

## 2012-03-15 NOTE — Progress Notes (Signed)
Utilization review completed.  

## 2012-03-16 LAB — BASIC METABOLIC PANEL
BUN: 6 mg/dL (ref 6–23)
Chloride: 99 mEq/L (ref 96–112)
Creatinine, Ser: 0.68 mg/dL (ref 0.50–1.10)
Glucose, Bld: 103 mg/dL — ABNORMAL HIGH (ref 70–99)
Potassium: 2.4 mEq/L — CL (ref 3.5–5.1)

## 2012-03-16 LAB — HEMOGLOBIN AND HEMATOCRIT, BLOOD
HCT: 36.6 % (ref 36.0–46.0)
Hemoglobin: 12.7 g/dL (ref 12.0–15.0)

## 2012-03-16 LAB — CBC
HCT: 32.9 % — ABNORMAL LOW (ref 36.0–46.0)
Hemoglobin: 11.4 g/dL — ABNORMAL LOW (ref 12.0–15.0)
MCV: 86.1 fL (ref 78.0–100.0)
RDW: 13.6 % (ref 11.5–15.5)
WBC: 7 10*3/uL (ref 4.0–10.5)

## 2012-03-16 LAB — GLUCOSE, CAPILLARY
Glucose-Capillary: 257 mg/dL — ABNORMAL HIGH (ref 70–99)
Glucose-Capillary: 309 mg/dL — ABNORMAL HIGH (ref 70–99)
Glucose-Capillary: 96 mg/dL (ref 70–99)

## 2012-03-16 MED ORDER — METOCLOPRAMIDE HCL 10 MG PO TABS
10.0000 mg | ORAL_TABLET | Freq: Three times a day (TID) | ORAL | Status: DC
  Administered 2012-03-16: 10 mg via ORAL
  Filled 2012-03-16 (×3): qty 1

## 2012-03-16 MED ORDER — PANTOPRAZOLE SODIUM 40 MG PO TBEC: 80.0000 mg | DELAYED_RELEASE_TABLET | Freq: Two times a day (BID) | ORAL | Status: DC

## 2012-03-16 MED ORDER — METRONIDAZOLE 500 MG PO TABS: 500.0000 mg | ORAL_TABLET | Freq: Two times a day (BID) | ORAL | Status: DC

## 2012-03-16 MED ORDER — LISINOPRIL 5 MG PO TABS: 10.0000 mg | ORAL_TABLET | Freq: Every day | ORAL | Status: DC

## 2012-03-16 MED ORDER — POTASSIUM CHLORIDE CRYS ER 20 MEQ PO TBCR: 20.0000 meq | EXTENDED_RELEASE_TABLET | Freq: Every day | ORAL | Status: DC

## 2012-03-16 MED ORDER — POTASSIUM CHLORIDE CRYS ER 20 MEQ PO TBCR
40.0000 meq | EXTENDED_RELEASE_TABLET | Freq: Once | ORAL | Status: AC
  Administered 2012-03-16: 40 meq via ORAL
  Filled 2012-03-16: qty 2

## 2012-03-16 MED ORDER — POLYETHYLENE GLYCOL 3350 17 G PO PACK: 17.0000 g | PACK | Freq: Every day | ORAL | Status: DC

## 2012-03-16 MED ORDER — METOCLOPRAMIDE HCL 10 MG PO TABS: 10.0000 mg | ORAL_TABLET | Freq: Three times a day (TID) | ORAL | Status: DC

## 2012-03-16 MED ORDER — ONDANSETRON 8 MG PO TBDP: 8.0000 mg | ORAL_TABLET | Freq: Three times a day (TID) | ORAL | Status: DC | PRN

## 2012-03-16 MED ORDER — POTASSIUM CHLORIDE CRYS ER 20 MEQ PO TBCR
40.0000 meq | EXTENDED_RELEASE_TABLET | Freq: Two times a day (BID) | ORAL | Status: DC
  Administered 2012-03-16: 40 meq via ORAL
  Filled 2012-03-16: qty 2

## 2012-03-16 MED ORDER — POTASSIUM CHLORIDE 10 MEQ/100ML IV SOLN
10.0000 meq | INTRAVENOUS | Status: DC
  Filled 2012-03-16 (×5): qty 100

## 2012-03-16 NOTE — Progress Notes (Signed)
CRITICAL VALUE ALERT  Critical value received:  K+ 2.4   Date of notification:  03/16/2012  Time of notification:  0616  Critical value read back:yes  Nurse who received alert:  Leonia Reeves   MD notified (1st page):  On call MD for Family Practice  Time of first page:  0618  MD notified (2nd page):  Time of second page:  Responding MD:  Minerva Areola, MD  Time MD responded:  [phone]

## 2012-03-16 NOTE — Progress Notes (Signed)
FMTS Attending Daily Note: Miakoda Mcmillion MD [phone] pager office [phone] I  have seen and examined this patient, reviewed their chart. I have discussed this patient with the resident. I agree with the resident's findings, assessment and care plan. 

## 2012-03-16 NOTE — Progress Notes (Signed)
Family Medicine Teaching Service Daily Progress Note Service Page: [phone]  Subjective:  Nausea resolved this am and tolerating PO food well this  And last night. Abd pain resolved. She does note that she is having a new period when she had one 2 weeks ago. Describes herself as having regular flow monthly periods lasting 3-5 days at a time. This is a new problem. Interested in contraception, prefers depo provera injection but has used it for 5+ years earlier in life.   Objective: Temp:  [98.2 F (36.8 C)-99 F (37.2 C)] 99 F (37.2 C) (02/14 2107) Pulse Rate:  [83-109] 83 (02/15 0500) Resp:  [16-18] 18 (02/15 0500) BP: (118-174)/(66-95) 118/66 mmHg (02/15 0500) SpO2:  [98 %-100 %] 98 % (02/15 0500) Weight:  [145 lb 4.5 oz (65.9 kg)] 145 lb 4.5 oz (65.9 kg) (02/14 2107)  Exam: General: NAD, sitting up in bed Cardiovascular: RRR, no mrg  Respiratory: CTAB, normal respiratory effort  Abdomen: Nl BS. SNTND Extremities: no lower extremity edema  Neuro: nonfocal, speech intact  I have reviewed the patient's medications, labs, imaging, and diagnostic testing.  Notable results are summarized below.  CBC BMET   Recent Labs Lab 03/14/12 0600 03/15/12 0425 03/16/12 0500  WBC 12.0* 7.4 7.0  HGB 12.7 13.5 11.4*  HCT 37.6 39.0 32.9*  PLT 294 274 241    Recent Labs Lab 03/14/12 0600 03/15/12 0425 03/16/12 0500  NA 140 138 139  K 3.0* 3.0* 2.4*  CL 101 98 99  CO2 27 28 30   BUN 6 7 6   CREATININE 0.77 0.77 0.68  GLUCOSE 127* 139* 103*  CALCIUM 8.9 9.0 8.5     U Preg negative   03/11/2012 17:51  Color, Urine YELLOW  APPearance CLOUDY (A)  Specific Gravity, Urine 1.028  pH 5.5  Glucose >1000 (A)  Bilirubin Urine NEGATIVE  Ketones, ur 15 (A)  Protein 30 (A)  Urobilinogen, UA 0.2  Nitrite NEGATIVE  Leukocytes, UA TRACE (A)  Hgb urine dipstick SMALL (A)  Urine-Other AMORPHOUS URATES/PHOSPHATES  WBC, UA 7-10  RBC / HPF 3-6  Squamous Epithelial / LPF FEW (A)   Bacteria, UA FEW (A)   Wet prep: + trich, no Clue cells, no candidia, rare WBC  Transvaginal US 03/13/2012 IMPRESSION:  Normal study. No evidence of pelvic mass or other significant  abnormality.  [place] Abd 03/13/2012 IMPRESSION:  Negative abdominal ultrasound.  Plan: MALANIE KOLOSKI is a 32 y.o. year old female with a history of T1DM and gastroparesis presenting with intractable nausea and vomiting, with some report of blood in her emesis.   nausea/vomiting/abdominal pain - resolved - likely represents gastroparesis given normal labs and hx of type I diabetes, structural causes ruled out with pelvic/abd/transvaginal US and CT abd/pelvis.  - antiemetics scheduled zofran, pro-motility with reglan, and will dc PRN phenergan and ativan for antiemetic. - IV NS @ 150 cc/hr  - did not tolerate NG tube - UA not suggestive of UTI, UCx negative  - Urine GCC negative - ESR slightly elevated at 26, and ANA pending, (had malar rash prior to admission)  possible GI bleed  - Hgb until today- trend 11.9-->12.7-->13.5-->11.4 - Will repeat to confirm 2 gram drop overnight, also note that her period has begun and that she may have concentrated som e due to decreased PO intake - PO protonix 80 BID- continue until follow up.   Hypokalemia - Down to 2.6 today, likely from Decreased PO intake - will replete with 40 X 2 today -  renal function Nl - Continue 20 mEq daily until f/u   Trichomonas - possibly explains some suprapubic pain,  - metronidazole 500 BID, day 3, as refused dose yesterday, will prescribe 5 more days  type I diabetes:  - anion gap was 15 now closed  - 15 units lantus now, restart hom edose of 30 daily at dc - moderate SSI with  q4h CBG checks - range 96-379 - most recent A1c 9.8 in Nov 2013, 10.8 on re-check  hypertension:  - up to 174/95 overnight,  - added lisinopril yesterday, increase to 10 daily at dc.  hyperlipidemia:  - not on any outpatient lipid altering  medication  - most recent LDL 93 in June 2013  - recommend outpatient f/u for possible addition of statin  FEN/GI: regular diet, SLIV,  PPx: SCDs, no heparin as possible GI bleed Dispo: home today pending tolerance of PO potassium  Code Status: Full  Kevin Fenton, MD 03/16/2012, 8:17 AM

## 2012-03-16 NOTE — Discharge Summary (Signed)
Family Medicine Teaching [facility] Discharge Summary  Patient name: [name] Medical record number: [id number] Date of birth: 01-May-1980 Age: 32 y.o. Gender: female Date of Admission: 03/11/2012  Date of Discharge: 03/16/2012 Admitting Physician: Rachel Grim, MD  Primary Care Provider: Marena Chancy, MD  Indication for Hospitalization: Intractable nausea and vomiting Discharge Diagnoses:  1. Intractable nausea and vomiting 2. Probable Gastroparesis 3. Possible GI Bleed 4. Hypokalemia 5. Trichomonas 6. T1DM 7. HTN 8. HLD  Consultations: None  Significant Labs and Imaging:   Recent Labs Lab 03/14/12 0600 03/15/12 0425 03/16/12 0500 03/16/12 1103  HGB 12.7 13.5 11.4* 12.7  HCT 37.6 39.0 32.9* 36.6  WBC 12.0* 7.4 7.0  --   PLT 294 274 241  --     Recent Labs Lab 03/12/12 0650 03/13/12 0941 03/14/12 0600 03/15/12 0425 03/15/12 0824 03/16/12 0500  NA 147* 142 140 138  --  139  K 3.4* 3.3* 3.0* 3.0*  --  2.4*  CL 108 104 101 98  --  99  CO2 27 26 27 28   --  30  GLUCOSE 86 112* 127* 139*  --  103*  BUN 11 7 6 7   --  6  CREATININE 0.67 0.74 0.77 0.77  --  0.68  CALCIUM 9.4 9.1 8.9 9.0  --  8.5  MG  --   --   --   --  1.7  --    Wet prep: + trich, no Clue cells, no candidia, rare WBC   Transvaginal US 03/13/2012  IMPRESSION:  Normal study. No evidence of pelvic mass or other significant  abnormality.   [place] Abd 03/13/2012  IMPRESSION:  Negative abdominal ultrasound.  CT abd and pelvis with 2/14 IMPRESSION:  1. No acute abdominal or pelvic findings.  2. Left renal scarring.  3. Functional ovarian cyst of the left ovary.  4. No evidence of bowel obstruction.   Procedures: None  Brief Hospital Course:  [name] is a 32 y.o. year old female with a history of T1DM and gastroparesis presenting with intractable nausea and vomiting, with some report of blood in her emesis. Her symptoms are felt to be most consistent with gastroparesis.    nausea/vomiting/abdominal pain Felt to be most likely representative of gastroparesis given her normal labs and hx of type I diabetes, and good response to reglan in the past. Structural causes were ruled out with pelvic/abd/transvaginal US and CT abd/pelvis taht were all WNL. Trichomonas was found on wet prep but doesn't explain the severity of her symptoms. A urine GCC was negative, a UA was not suggestive of UTI, and a UCx negative . We tried  Scheduled zofran, reglan, and PRN phenergan and ativan for nasuea, all of which did not appear to help until the last day of dc. She began to tolerate PO intake and she was continued on TID reglan with PRN zofran ODT.   Her ESR was slightly elevated (26) and an ANA is pending as she mentioned a previous malar rash.   possible GI bleed  Her hgb was steady throughout admission. She continued to complain of blood streaked vomitus which couldrepresent mallory weiss tears from multiple episodes of vomiting. She was started on PO protonix 80 BID during admission which was continued and will likely be appropriate for decrease at f/u.   Hypokalemia  Her potassium was 2.6 at it's lowest. Attributed to repeated vomitus and poor PO intake. She could tolerate 2 doses of 40 mEQ KCl on the  day of dc and she was given 20 mEq daily to take until she sees her PCP next week.    Trichomonas  Found on wet prep and possibly explains some suprapubic pain. Started treatement with metronidazole 500 BID. She had troubkle keeping the pills down on a few occasions and she was given 5 additional days on dc.   Type I diabetes Her Anion gap on presentation was 15 and close dwith only fluids and sq insulin. We decreased  Her lantus dose while her PO intake was poor and returned it to 30 units daily at dc. We used a Moderate SSI. Her A1c is 10.8.   Hypertension - A new diagnosis on this admission. We started lisinopril at 5mg  and increased it to 10 mg daily.  hyperlipidemia:  - not  on any outpatient lipid altering medication, her most recent LDL 93 in June 2013. Consider addition of statin considering her DM and HTN.   Dispo: Home  Discharge Medications:    Medication List    TAKE these medications       [employer] Lancets Original Misc  1 Stick by Does not apply route 4 (four) times daily -  before meals and at bedtime.     glucose blood test strip  Commonly known as:  ACCU-CHEK AVIVA  Use as instructed     ibuprofen 800 MG tablet  Commonly known as:  ADVIL,MOTRIN  Take 800 mg by mouth every 8 (eight) hours as needed. For pain     insulin aspart 100 UNIT/ML injection  Commonly known as:  novoLOG  Inject 5-6 Units into the skin 3 (three) times daily as needed. For CBG over 180     insulin glargine 100 UNIT/ML injection  Commonly known as:  LANTUS  Inject 30 Units into the skin every evening. At 5pm     lisinopril 5 MG tablet  Commonly known as:  PRINIVIL,ZESTRIL  Take 2 tablets (10 mg total) by mouth daily.     metoCLOPramide 10 MG tablet  Commonly known as:  REGLAN  Take 1 tablet (10 mg total) by mouth 3 (three) times daily before meals.     metroNIDAZOLE 500 MG tablet  Commonly known as:  FLAGYL  Take 1 tablet (500 mg total) by mouth 2 (two) times daily.     multivitamin with minerals Tabs  Take 1 tablet by mouth daily.     ondansetron 8 MG disintegrating tablet  Commonly known as:  ZOFRAN-ODT  Take 1 tablet (8 mg total) by mouth every 8 (eight) hours as needed for nausea.     pantoprazole 40 MG tablet  Commonly known as:  PROTONIX  Take 2 tablets (80 mg total) by mouth 2 (two) times daily.     polyethylene glycol packet  Commonly known as:  MIRALAX / GLYCOLAX  Take 17 g by mouth daily.     potassium chloride SA 20 MEQ tablet  Commonly known as:  K-DUR,KLOR-CON  Take 1 tablet (20 mEq total) by mouth daily.       Issues for Follow Up:  - Consider gastric emptying study to confirm diagnosis of gastroparesis.  - HTN- new  diagnosis,consider adding/reducing meds as necessary - Contraception- patient interested, advise as necessary - GI bleed- High dose PPI started on this admission. Consider tapering dose if symptoms resolve.  - Consider addition of statin considering her DM and HTN.  - hypokalemia- 2.6 on day of dc. Was replaced and given daily KCl hoping that it could be stopped  if her potasium is normal at her follow up appt.   Outstanding Results: Anti-nuclear antibody  Discharge Instructions: Please refer to Patient Instructions section of EMR for full details.  Patient was counseled important signs and symptoms that should prompt return to medical care, changes in medications, dietary instructions, activity restrictions, and follow up appointments.       Follow-up Information   Call Rachel Chancy, MD. (for appt in 1 week)    Contact information:   [address] [place] [address] [phone]       Discharge Condition: Carlena Sax, MD 03/16/2012, 3:44 PM

## 2012-03-16 NOTE — Progress Notes (Signed)
Patient discharged to home. Patient AVS reviewed. Patient verbalized understanding of medications and follow-up appointments.  Patient remains stable; no signs or symptoms of distress.  Patient educated to return to the ER in cases of exacerbation of admitting symptoms, SOB, dizziness, fever, chest pain, or fainting.   

## 2012-03-17 NOTE — Discharge Summary (Signed)
Family Medicine Teaching Service  Discharge Note : Attending Srinidhi Landers MD Pager [phone] Office [phone] I have seen and examined this patient, reviewed their chart and discussed discharge planning wit the resident at the time of discharge. I agree with the discharge plan as above.  

## 2012-03-18 LAB — ANA: Anti Nuclear Antibody(ANA): NEGATIVE

## 2012-03-21 ENCOUNTER — Ambulatory Visit: Payer: Medicaid Other | Admitting: Family Medicine

## 2012-04-10 ENCOUNTER — Ambulatory Visit: Payer: Medicaid Other | Admitting: Family Medicine

## 2012-04-19 ENCOUNTER — Encounter: Payer: Self-pay | Admitting: Family Medicine

## 2012-04-19 ENCOUNTER — Ambulatory Visit (INDEPENDENT_AMBULATORY_CARE_PROVIDER_SITE_OTHER): Payer: Medicaid Other | Admitting: Family Medicine

## 2012-04-19 ENCOUNTER — Other Ambulatory Visit (HOSPITAL_COMMUNITY)
Admission: RE | Admit: 2012-04-19 | Discharge: 2012-04-19 | Disposition: A | Discharge status: Home / Self Care | Payer: Medicaid Other | Source: Ambulatory Visit | Attending: Family Medicine | Admitting: Family Medicine

## 2012-04-19 VITALS — BP 125/87 | HR 91 | Ht 65.0 in | Wt 152.0 lb

## 2012-04-19 DIAGNOSIS — D649 Anemia, unspecified: Secondary | ICD-10-CM

## 2012-04-19 DIAGNOSIS — I10 Essential (primary) hypertension: Secondary | ICD-10-CM

## 2012-04-19 DIAGNOSIS — IMO0002 Reserved for concepts with insufficient information to code with codable children: Secondary | ICD-10-CM

## 2012-04-19 DIAGNOSIS — E108 Type 1 diabetes mellitus with unspecified complications: Secondary | ICD-10-CM

## 2012-04-19 DIAGNOSIS — E876 Hypokalemia: Secondary | ICD-10-CM

## 2012-04-19 DIAGNOSIS — E1065 Type 1 diabetes mellitus with hyperglycemia: Secondary | ICD-10-CM

## 2012-04-19 DIAGNOSIS — R21 Rash and other nonspecific skin eruption: Secondary | ICD-10-CM

## 2012-04-19 DIAGNOSIS — N760 Acute vaginitis: Secondary | ICD-10-CM

## 2012-04-19 DIAGNOSIS — Z113 Encounter for screening for infections with a predominantly sexual mode of transmission: Secondary | ICD-10-CM | POA: Insufficient documentation

## 2012-04-19 DIAGNOSIS — E7800 Pure hypercholesterolemia, unspecified: Secondary | ICD-10-CM

## 2012-04-19 LAB — CBC WITH DIFFERENTIAL/PLATELET
HCT: 36.8 % (ref 36.0–46.0)
Hemoglobin: 12.1 g/dL (ref 12.0–15.0)
Lymphocytes Relative: 43 % (ref 12–46)
Lymphs Abs: 2.2 10*3/uL (ref 0.7–4.0)
Monocytes Absolute: 0.4 10*3/uL (ref 0.1–1.0)
Monocytes Relative: 7 % (ref 3–12)
Neutro Abs: 2.2 10*3/uL (ref 1.7–7.7)
Neutrophils Relative %: 44 % (ref 43–77)
RBC: 4.19 MIL/uL (ref 3.87–5.11)

## 2012-04-19 LAB — BASIC METABOLIC PANEL
CO2: 29 mEq/L (ref 19–32)
Chloride: 99 mEq/L (ref 96–112)
Glucose, Bld: 283 mg/dL — ABNORMAL HIGH (ref 70–99)
Potassium: 4.4 mEq/L (ref 3.5–5.3)
Sodium: 135 mEq/L (ref 135–145)

## 2012-04-19 LAB — POCT WET PREP (WET MOUNT)

## 2012-04-19 MED ORDER — METRONIDAZOLE 500 MG PO TABS: 500.0000 mg | ORAL_TABLET | Freq: Two times a day (BID) | ORAL | Status: DC

## 2012-04-19 MED ORDER — AMMONIUM LACTATE 12 % EX CREA: TOPICAL_CREAM | CUTANEOUS | Status: DC | PRN

## 2012-04-19 MED ORDER — FLUCONAZOLE 150 MG PO TABS: 150.0000 mg | ORAL_TABLET | Freq: Once | ORAL | Status: DC

## 2012-04-19 MED ORDER — HYDROCORTISONE 1 % EX OINT: TOPICAL_OINTMENT | Freq: Two times a day (BID) | CUTANEOUS | Status: DC

## 2012-04-19 NOTE — Assessment & Plan Note (Addendum)
Labile diabetes with A1C of 10.8 and episodes of symptomatic hypoglycemia. Referred patient to pharmacy clinic to adjust insulin therapy. She may benefit from taking lantus in the morning since hypoglycemic episodes happen in the morning. In the meantime, will decrease dose of lantus to 28units at night time. Patient to bring log of CBG's at night time and morning fasting levels.

## 2012-04-19 NOTE — Assessment & Plan Note (Signed)
Wet prep positive for BV. Was already treated with flagyl for 7 days for trichomonas. Did not have BV at the time.  Will treat BV with 7 day course of flagyl 500mg  bid. Rx for diflucan also sent in case of yeast infection after treatment.

## 2012-04-19 NOTE — Assessment & Plan Note (Signed)
Patient diagnosed with hypertension at hospital and discharged on lisinopril which she has not been taking since being discharged.  Normotensive today. Will hold antihypertensive therapy for now and follow up at next visit.

## 2012-04-19 NOTE — Assessment & Plan Note (Signed)
Dermatitis of unclear etiology. Doesn't appear to be bed bugs since nobody else in family has rash. Xerosis vs allergic rash. Will empirically treat with lac hydrin and hydrocortisone ointment.

## 2012-04-19 NOTE — Patient Instructions (Addendum)
Please make an appointment with Dr. Raymondo Band as soon as possible and log your sugars to bring him.  In the meantime, lets decrease your lantus to 28units at night time.   For the rash, it may be caused by dry skin or even seasonal allergies. I am prescribing two creams to use daily.  Please follow up with me for your annual gynecological exam.

## 2012-04-19 NOTE — Assessment & Plan Note (Signed)
Given extensive list of problems at today's visit, did not address starting a statin for primary prevention in context of type 1 diabetes. Will address this at next visit and plan on starting zocor 20mg  daily.

## 2012-04-19 NOTE — Assessment & Plan Note (Signed)
K of 2.4 on discharge from hospital. Patient taking KDur intermittently. Check BMP today.

## 2012-04-19 NOTE — Progress Notes (Signed)
Patient ID: YEIRA GULDEN    DOB: 05/12/1980, 32 y.o.   MRN: [id number] --- Subjective:  Carleen is a 32 y.o.female who presents for hospital follow up, hospitalized on 03/11/12 for nausea, vomiting.  Concerns include: - rash on back and under breast: was present before hospitalization, had resolved and recurred a couple of weeks after being discharged from hospital. Rash itches and consists of little spots. She had had bed bugs 3-4 months ago but changed her bed, sheets and moved houses. Nobody else at home has similar rash. She tried prescription that had been prescribed for scabies for her kids in the past, without relief. No change in detergent, soaps or creams. No fever, no fatigue, no weight loss.   - vaginal discharge: was diagnosed with trichomonas in hospital and given course of flagyl which she finished. Continues to have white, itchy discharge.   - DM2: 5-7 units of novolog at mealtimes. Doesn't take novolog at every meal. Does consistently take lantus 30units at night.  CBG's: high: 300's a couple of times CBG's tend to run Lowest in the morning: 60-90's.  Lowest: 48 one week ago. Occurrs 4-5 times. Can't remember if took Novolog at night.  Dinner at 8-9PM at which point she takes lantus. Wakes up at 6:30-7am.  - blood pressure: not taking the medicine. BP at home: normal. No chest pain. No lower extremity swelling. No shortness of breath.   ROS: see HPI Past Medical History: reviewed and updated medications and allergies. Social History: Tobacco: 1/2 pack per day  Objective: Filed Vitals:   04/19/12 1342  BP: 125/87  Pulse: 91    Physical Examination:   General appearance - alert, well appearing, and in no distress Chest - clear to auscultation, no wheezes, rales or rhonchi, symmetric air entry Heart - normal rate, regular rhythm, normal S1, S2, no murmurs, rubs, clicks or gallops Skin - hyperpigmented papules 2-4 mm in diameter scattered along back, shoulders and under  left breast and in between breast.  Pelvic exam: normal external genitalia, small nodule outside of left labia, non fluctuant, questionable scar tissue. Normal vulva, vagina, cervix, uterus and adnexa. Scant whitish discharge present.

## 2012-04-19 NOTE — Assessment & Plan Note (Signed)
Patient with Hg of 11.4 during hospitalization. Normocytic anemia. No iron studies.  Will recheck CBC today.

## 2012-05-09 ENCOUNTER — Telehealth: Payer: Self-pay | Admitting: Family Medicine

## 2012-05-09 NOTE — Telephone Encounter (Signed)
Called and left message with Rozelle Logan thanking her for her help with Ms. Bulls and her medications. I am available if she needs any written prescriptions.   Marena Chancy, PGY-2 Family Medicine Resident

## 2012-05-09 NOTE — Telephone Encounter (Signed)
Fwd. To PCP for review .[name]  

## 2012-05-09 NOTE — Telephone Encounter (Signed)
The nurse did a home visit with the patient and determined that the patient is only taking 3 of the 9 medications that have been prescribed,  She is taking Lantus and Novolog and Metoclopramide.  The others she cannot afford.  The nurse is going to try and get [facility] to assist with getting the other meds for her.

## 2012-05-28 ENCOUNTER — Other Ambulatory Visit: Payer: Self-pay | Admitting: Family Medicine

## 2012-05-28 ENCOUNTER — Encounter: Payer: Self-pay | Admitting: Family Medicine

## 2012-06-03 ENCOUNTER — Ambulatory Visit (INDEPENDENT_AMBULATORY_CARE_PROVIDER_SITE_OTHER): Payer: Medicaid Other | Admitting: Family Medicine

## 2012-06-03 ENCOUNTER — Telehealth: Payer: Self-pay | Admitting: Family Medicine

## 2012-06-03 ENCOUNTER — Encounter: Payer: Self-pay | Admitting: Family Medicine

## 2012-06-03 VITALS — BP 120/80 | HR 72 | Temp 98.5°F | Ht 65.0 in | Wt 152.3 lb

## 2012-06-03 DIAGNOSIS — IMO0002 Reserved for concepts with insufficient information to code with codable children: Secondary | ICD-10-CM

## 2012-06-03 DIAGNOSIS — E1065 Type 1 diabetes mellitus with hyperglycemia: Secondary | ICD-10-CM

## 2012-06-03 DIAGNOSIS — R21 Rash and other nonspecific skin eruption: Secondary | ICD-10-CM

## 2012-06-03 MED ORDER — PERMETHRIN 5 % EX CREA: TOPICAL_CREAM | Freq: Once | CUTANEOUS | Status: DC

## 2012-06-03 MED ORDER — CETIRIZINE HCL 10 MG PO TABS: 10.0000 mg | ORAL_TABLET | Freq: Every day | ORAL | Status: DC

## 2012-06-03 NOTE — Telephone Encounter (Signed)
Called pt. Will sched. Diabetic eye exam for her. Lorenda Hatchet, Renato Battles

## 2012-06-03 NOTE — Progress Notes (Signed)
  Subjective:    Patient ID: [name], female    DOB: 1981-01-04, 32 y.o.   MRN: [id number]  HPIHere for evaluation of 3 months of itching  States feels like when she previously had scabies.  Notes primarily on back.  No longer under her bra line and on hands as it was.  No household contacts affected.   Has tried moisturizers without improvement  No change in meds, soaps, deteregents    Review of Systemssee HPI.  No fever     Objective:   Physical Exam GEN; NAD Skin:  No active papules.  Areas of dryness as well as postinflammatory hyperpigmentation on back       Assessment & Plan:

## 2012-06-03 NOTE — Assessment & Plan Note (Signed)
Will treat empirically for scabies.  Favor xerosis/contact derm as more likely.  Will rx cetrizine, advised liberal moisturization

## 2012-06-03 NOTE — Telephone Encounter (Signed)
Scheduled appt with Dr.Shapiro 07-08-12 at 3 pm. Pt informed and info given. Marland KitchenLorenda Hatchet, Renato Battles

## 2012-06-03 NOTE — Patient Instructions (Addendum)
Use body moisturizer for sensitive skin such as Aveeno or eucerin

## 2012-06-03 NOTE — Telephone Encounter (Signed)
Pt is asking to be referred to a eye doctor for her retinopathy.

## 2012-06-20 ENCOUNTER — Telehealth: Payer: Self-pay | Admitting: Family Medicine

## 2012-06-20 ENCOUNTER — Encounter: Payer: Self-pay | Admitting: Family Medicine

## 2012-06-20 NOTE — Telephone Encounter (Signed)
ERROR

## 2012-06-20 NOTE — Telephone Encounter (Signed)
Returned call to pt. Pt screaming into phone and talking over nurse. " I want to se a diabetes doctor and a gynecologist. [name] always telling my doctor ain't there that's the point of why I want to see another doctor. I'm getting to old for this. I just want to see who I want to see. You not getting what I am saying." Attempted to explain clinic practice procedures and referral process - " I don't want to hear none of that just write my doctor a note"  Pt would like referral to gyn and endocrinologist.    Wyatt Haste, RN-BSN

## 2012-06-20 NOTE — Telephone Encounter (Signed)
Spoke with patient and explained to her that Dr. Gwenlyn Saran sees patients for GYN and DM.  Pt very adamant about wanting referrals.  I instructed patient she needs to have an appt with Dr. Gwenlyn Saran before any referrals made.  [place], Darlyne Russian, CMA

## 2012-06-20 NOTE — Telephone Encounter (Signed)
Pt is asking to get a referral to see a GYN and a DM doctor -

## 2012-06-20 NOTE — Telephone Encounter (Signed)
Pt called-demanding to speak to a nurse. She is asking for a referral to a diabetes dr and gynecologist. She would not allow a phone message to be sent. Wants to talk to nurse only.

## 2012-06-21 NOTE — Telephone Encounter (Signed)
This encounter was created in error - please disregard.

## 2012-07-05 ENCOUNTER — Emergency Department (HOSPITAL_COMMUNITY): Payer: Medicaid Other

## 2012-07-05 ENCOUNTER — Encounter (HOSPITAL_COMMUNITY): Payer: Self-pay | Admitting: Cardiology

## 2012-07-05 ENCOUNTER — Emergency Department (HOSPITAL_COMMUNITY)
Admission: EM | Admit: 2012-07-05 | Discharge: 2012-07-05 | Disposition: A | Discharge status: Home / Self Care | Payer: Medicaid Other | Attending: Emergency Medicine | Admitting: Emergency Medicine

## 2012-07-05 ENCOUNTER — Ambulatory Visit: Payer: Medicaid Other | Admitting: Family Medicine

## 2012-07-05 DIAGNOSIS — Z794 Long term (current) use of insulin: Secondary | ICD-10-CM | POA: Insufficient documentation

## 2012-07-05 DIAGNOSIS — R079 Chest pain, unspecified: Secondary | ICD-10-CM

## 2012-07-05 DIAGNOSIS — I10 Essential (primary) hypertension: Secondary | ICD-10-CM | POA: Insufficient documentation

## 2012-07-05 DIAGNOSIS — R209 Unspecified disturbances of skin sensation: Secondary | ICD-10-CM | POA: Insufficient documentation

## 2012-07-05 DIAGNOSIS — Z79899 Other long term (current) drug therapy: Secondary | ICD-10-CM | POA: Insufficient documentation

## 2012-07-05 DIAGNOSIS — Z87448 Personal history of other diseases of urinary system: Secondary | ICD-10-CM | POA: Insufficient documentation

## 2012-07-05 DIAGNOSIS — Z862 Personal history of diseases of the blood and blood-forming organs and certain disorders involving the immune mechanism: Secondary | ICD-10-CM | POA: Insufficient documentation

## 2012-07-05 DIAGNOSIS — Z8639 Personal history of other endocrine, nutritional and metabolic disease: Secondary | ICD-10-CM | POA: Insufficient documentation

## 2012-07-05 DIAGNOSIS — F17200 Nicotine dependence, unspecified, uncomplicated: Secondary | ICD-10-CM | POA: Insufficient documentation

## 2012-07-05 DIAGNOSIS — E119 Type 2 diabetes mellitus without complications: Secondary | ICD-10-CM | POA: Insufficient documentation

## 2012-07-05 LAB — CBC
Hemoglobin: 12.4 g/dL (ref 12.0–15.0)
RBC: 4.19 MIL/uL (ref 3.87–5.11)
WBC: 7.6 10*3/uL (ref 4.0–10.5)

## 2012-07-05 LAB — POCT I-STAT TROPONIN I: Troponin i, poc: 0.01 ng/mL (ref 0.00–0.08)

## 2012-07-05 MED ORDER — KETOROLAC TROMETHAMINE 60 MG/2ML IM SOLN
60.0000 mg | Freq: Once | INTRAMUSCULAR | Status: AC
  Administered 2012-07-05: 60 mg via INTRAMUSCULAR
  Filled 2012-07-05: qty 2

## 2012-07-05 MED ORDER — OXYCODONE-ACETAMINOPHEN 5-325 MG PO TABS
2.0000 | ORAL_TABLET | Freq: Once | ORAL | Status: AC
  Administered 2012-07-05: 2 via ORAL
  Filled 2012-07-05: qty 2

## 2012-07-05 MED ORDER — NAPROXEN 500 MG PO TABS: 500.0000 mg | ORAL_TABLET | Freq: Two times a day (BID) | ORAL | Status: DC

## 2012-07-05 MED ORDER — OXYCODONE-ACETAMINOPHEN 5-325 MG PO TABS: 1.0000 | ORAL_TABLET | ORAL | Status: DC | PRN

## 2012-07-05 NOTE — ED Notes (Signed)
Pt reports that yesterday she started having chest pain and numbness in her left arm. Reports that she took an aspirin which helped. States that the symptoms continued into this morning. Reports pain at this time.

## 2012-07-05 NOTE — ED Notes (Signed)
Pt stated concern regarding lasting effects of the pain medication Toradol. Pt refused medication at first because she wanted something that would last longer. Pharmacy tech notified patient the medication would peak in 6 hours. Pt then accepted injection of Toradol.

## 2012-07-05 NOTE — ED Provider Notes (Signed)
History     CSN: [id number]  Arrival date & time 07/05/12  4098   First MD Initiated Contact with Patient 07/05/12 [phone]      Chief Complaint  Patient presents with  . Chest Pain  . Numbness    (Consider location/radiation/quality/duration/timing/severity/associated sxs/prior treatment) HPI Comments: 32 year old female with a history of diabetes who presents with the complaint of left arm pain, left upper back pain and left chest pain. This has been intermittent since yesterday morning but becoming more progressively worse and intense, more constant and worse with movement of the arm and palpation of the chest. She has no associated coughing, shortness of breath, fevers or swelling in her legs. She recently stopped working at a [employer] where she was working around high temperatures and felt that when she was standing all day long around this machine she would develop swelling in her left side more than her right side, this would go away on the weekends and she has not had any significant swelling since that time. She denies trauma, travel, immobilization, contraceptive use, but she is a daily smoker. She has no prior history of high blood pressure or heart disease.  Patient is a 32 y.o. female presenting with chest pain. The history is provided by the patient.  Chest Pain   Past Medical History  Diagnosis Date  . Hyperlipidemia   . Hypertension   . Diabetes mellitus     A1c > 11. Patient does not check BG or take insulin regularly.  Dx at age 84.  . Tobacco abuse   . Incarceration     Was in jail for selling drugs - out Oct 2010.  [name] Renal disorder     Past Surgical History  Procedure Laterality Date  . Tubal ligation  2009    History reviewed. No pertinent family history.  History  Substance Use Topics  . Smoking status: Current Every Day Smoker -- 0.50 packs/day    Types: Cigarettes  . Smokeless tobacco: Never Used     Comment: Quit 09/21/2010 while  hospitalilzed/restarted again.   . Alcohol Use: 0.5 oz/week    1 drink(s) per week     Comment: Occasional.    OB History   Grav Para Term Preterm Abortions TAB SAB Ect Mult Living                  Review of Systems  Cardiovascular: Positive for chest pain.  All other systems reviewed and are negative.    Allergies  Review of patient's allergies indicates no known allergies.  Home Medications   Current Outpatient Rx  Name  Route  Sig  Dispense  Refill  . ibuprofen (ADVIL,MOTRIN) 800 MG tablet   Oral   Take 800 mg by mouth every 8 (eight) hours as needed. For pain         . insulin aspart (NOVOLOG) 100 UNIT/ML injection   Subcutaneous   Inject 5-6 Units into the skin 3 (three) times daily as needed. For CBG over 180         . insulin glargine (LANTUS) 100 UNIT/ML injection   Subcutaneous   Inject 30 Units into the skin at bedtime.         . metoCLOPramide (REGLAN) 10 MG tablet   Oral   Take 1 tablet (10 mg total) by mouth 3 (three) times daily before meals.   90 tablet   3   . Multiple Vitamin (MULTIVITAMIN WITH MINERALS) TABS   Oral  Take 1 tablet by mouth every other day.          . ondansetron (ZOFRAN-ODT) 8 MG disintegrating tablet   Oral   Take 1 tablet (8 mg total) by mouth every 8 (eight) hours as needed for nausea.   60 tablet   0   . [employer] Lancets Original MISC   Does not apply   1 Stick by Does not apply route 4 (four) times daily -  before meals and at bedtime.   120 each   12   . glucose blood (ACCU-CHEK AVIVA) test strip      Use as instructed   100 each   12   . naproxen (NAPROSYN) 500 MG tablet   Oral   Take 1 tablet (500 mg total) by mouth 2 (two) times daily with a meal.   30 tablet   0   . oxyCODONE-acetaminophen (PERCOCET) 5-325 MG per tablet   Oral   Take 1 tablet by mouth every 4 (four) hours as needed for pain.   20 tablet   0     BP 141/86  Pulse 79  Temp(Src) 99.4 F (37.4 C) (Oral)  Resp 16  SpO2 100%   LMP 06/06/2012  Physical Exam  Nursing note and vitals reviewed. Constitutional: She appears well-developed and well-nourished. No distress.  HENT:  Head: Normocephalic and atraumatic.  Mouth/Throat: Oropharynx is clear and moist. No oropharyngeal exudate.  Eyes: Conjunctivae and EOM are normal. Pupils are equal, round, and reactive to light. Right eye exhibits no discharge. Left eye exhibits no discharge. No scleral icterus.  Neck: Normal range of motion. Neck supple. No JVD present. No thyromegaly present.  Cardiovascular: Normal rate, regular rhythm, normal heart sounds and intact distal pulses.  Exam reveals no gallop and no friction rub.   No murmur heard. Pulmonary/Chest: Effort normal and breath sounds normal. No respiratory distress. She has no wheezes. She has no rales. She exhibits tenderness ( Tenderness to palpation over the left upper chest wall consistent with the patient's pain, small fibrotic mass in the subcutaneous tissue, not erythematous, normal temperature).  Abdominal: Soft. Bowel sounds are normal. She exhibits no distension and no mass. There is no tenderness.  Musculoskeletal: Normal range of motion. She exhibits no edema and no tenderness ( Tender to palpation in the left rhomboid muscles).  Lymphadenopathy:    She has no cervical adenopathy.  Neurological: She is alert. Coordination normal.  Skin: Skin is warm and dry. No rash noted. No erythema.  Psychiatric: She has a normal mood and affect. Her behavior is normal.    ED Course  Procedures (including critical care time)  Labs Reviewed  CBC  BASIC METABOLIC PANEL  POCT I-STAT TROPONIN I   Dg Chest 2 View  07/05/2012   *RADIOLOGY REPORT*  Clinical Data: Left chest pain.  CHEST - 2 VIEW  Comparison: Single view of the chest 12/17/2011.  Findings: Lungs are clear.  Heart size is normal.  No pneumothorax or pleural fluid.  IMPRESSION: Negative chest.   Original Report Authenticated By: Holley Dexter, M.D.      1. Chest pain       MDM  No focal neurologic deficits, no abnormal lung sounds are heart sounds are normal EKG with a normal chest x-ray. I suspect that the patient's pain is coming from musculoskeletal pain, this is reproducible on exam, patient otherwise appears benign and is stable for discharge assuming normal lab work. At this time troponin is normal.  ED ECG  REPORT  I personally interpreted this EKG   Date: 07/05/2012   Rate: 83  Rhythm: normal sinus rhythm  QRS Axis: normal  Intervals: normal  ST/T Wave abnormalities: normal  Conduction Disutrbances:none  Narrative Interpretation:   Old EKG Reviewed: 31 compared with prior rhythm strip tracings, no acute changes  Patient reexamined at 1:30 PM, has improved symptoms after Toradol, again EKG is totally normal after having 2 days of ongoing symptoms, this is nonexertional but is related to palpation of the chest and movement of the arm, palpation of the rhomboid muscles of the left upper back. Vital signs are normal, patient stable for discharge to followup with family doctor. I doubt this is a cardiac event, I doubt this is a pulmonary embolism, the patient is improved and will followup with her family Dr., she is amenable to discharge plan.   Meds given in ED:  Medications  oxyCODONE-acetaminophen (PERCOCET/ROXICET) 5-325 MG per tablet 2 tablet (not administered)  ketorolac (TORADOL) injection 60 mg (60 mg Intramuscular Given 07/05/12 1214)    New Prescriptions   NAPROXEN (NAPROSYN) 500 MG TABLET    Take 1 tablet (500 mg total) by mouth 2 (two) times daily with a meal.   OXYCODONE-ACETAMINOPHEN (PERCOCET) 5-325 MG PER TABLET    Take 1 tablet by mouth every 4 (four) hours as needed for pain.           Vida Roller, MD 07/05/12 [phone]

## 2012-07-17 ENCOUNTER — Other Ambulatory Visit: Payer: Self-pay | Admitting: Family Medicine

## 2012-07-17 ENCOUNTER — Encounter: Payer: Self-pay | Admitting: Family Medicine

## 2012-07-17 ENCOUNTER — Ambulatory Visit (INDEPENDENT_AMBULATORY_CARE_PROVIDER_SITE_OTHER): Payer: Medicaid Other | Admitting: Family Medicine

## 2012-07-17 ENCOUNTER — Other Ambulatory Visit (HOSPITAL_COMMUNITY)
Admission: RE | Admit: 2012-07-17 | Discharge: 2012-07-17 | Disposition: A | Discharge status: Home / Self Care | Payer: Medicaid Other | Source: Ambulatory Visit | Attending: Family Medicine | Admitting: Family Medicine

## 2012-07-17 VITALS — BP 122/84 | HR 78 | Temp 98.5°F | Ht 65.0 in | Wt 151.2 lb

## 2012-07-17 DIAGNOSIS — IMO0002 Reserved for concepts with insufficient information to code with codable children: Secondary | ICD-10-CM

## 2012-07-17 DIAGNOSIS — N63 Unspecified lump in unspecified breast: Secondary | ICD-10-CM

## 2012-07-17 DIAGNOSIS — Z113 Encounter for screening for infections with a predominantly sexual mode of transmission: Secondary | ICD-10-CM | POA: Insufficient documentation

## 2012-07-17 DIAGNOSIS — N760 Acute vaginitis: Secondary | ICD-10-CM

## 2012-07-17 DIAGNOSIS — E1065 Type 1 diabetes mellitus with hyperglycemia: Secondary | ICD-10-CM

## 2012-07-17 LAB — POCT URINALYSIS DIPSTICK
Glucose, UA: 500
Ketones, UA: NEGATIVE
Leukocytes, UA: NEGATIVE
Spec Grav, UA: <=1.005
Urobilinogen, UA: 0.2

## 2012-07-17 LAB — POCT WET PREP (WET MOUNT)

## 2012-07-17 MED ORDER — METRONIDAZOLE 500 MG PO TABS: 500.0000 mg | ORAL_TABLET | Freq: Two times a day (BID) | ORAL | Status: DC

## 2012-07-18 NOTE — Assessment & Plan Note (Signed)
BV today, treat x1 month given recurrences.

## 2012-07-18 NOTE — Progress Notes (Signed)
Patient ID: [name], female   DOB: 1980-12-06, 32 y.o.   MRN: [id number] Subjective: The patient is a 32 y.o. year old female who presents today for vaginal discharge.  1. Patient has noticed for last several days that she has had a malodorous discharge. She has had this on multiple occasions in the past has been diagnosed with bacterial vaginosis. She denies any new sexual partners. She has had both chlamydia and trichomonas in the past. She denies any abdominal pain, nausea, vomiting, or dysuria.  2. GYN referral: The patient would like a GYN referral. She reports that, in her last hospitalization, she was told that she needed to be seen by the GYN. She is somewhat unclear as to the reason for this but feels very strongly that she should see them. Review of systems from a gynecologic standpoint shows that she has had some problems in the past with intra-period bleeding, recurrent bacterial vaginosis, and several episodes of sexually transmitted infections. Careful review of her hospital records is not indicate a particular reason why this referral was mentioned to the patient.  Patient's past medical, social, and family history were reviewed and updated as appropriate. History  Substance Use Topics  . Smoking status: Current Every Day Smoker -- 0.50 packs/day    Types: Cigarettes  . Smokeless tobacco: Never Used     Comment: Quit 09/21/2010 while hospitalilzed/restarted again.   . Alcohol Use: 0.5 oz/week    1 drink(s) per week     Comment: Occasional.   Objective:  Filed Vitals:   07/17/12 1410  BP: 122/84  Pulse: 78  Temp: 98.5 F (36.9 C)   Gen: No acute distress Pelvic: Normal external genitalia, normal vaginal mucosa, mildly malodorous white discharge, no cervical motion tenderness or friability  Assessment/Plan: Per patient request, I will forward this note to her primary care provider in 2 physicians to help to care for in hospital to see if any of them recall the reason  for a GYN referral. I am not opposed to her seeing a GYN, however I do not know what I would be referring her for.  Please also see individual problems in problem list for problem-specific plans.

## 2012-07-19 ENCOUNTER — Telehealth: Payer: Self-pay | Admitting: Family Medicine

## 2012-07-19 ENCOUNTER — Encounter: Payer: Self-pay | Admitting: Family Medicine

## 2012-07-19 NOTE — Telephone Encounter (Signed)
All three of phone numbers listed for patient are not in service.  Request MD write letter and I will mail.  Royden Bulman, Darlyne Russian, CMA

## 2012-07-19 NOTE — Telephone Encounter (Signed)
Please let patient know that gonorrhea and Chlamydia are negative.

## 2012-07-30 ENCOUNTER — Other Ambulatory Visit: Payer: Medicaid Other

## 2012-07-31 ENCOUNTER — Ambulatory Visit: Payer: Medicaid Other | Admitting: Family Medicine

## 2012-08-09 ENCOUNTER — Other Ambulatory Visit: Payer: Medicaid Other

## 2012-08-14 ENCOUNTER — Encounter: Payer: Self-pay | Admitting: Family Medicine

## 2012-08-19 ENCOUNTER — Emergency Department (HOSPITAL_COMMUNITY)
Admission: EM | Admit: 2012-08-19 | Discharge: 2012-08-19 | Disposition: A | Discharge status: Home / Self Care | Payer: Medicaid Other | Attending: Emergency Medicine | Admitting: Emergency Medicine

## 2012-08-19 ENCOUNTER — Encounter (HOSPITAL_COMMUNITY): Payer: Self-pay | Admitting: *Deleted

## 2012-08-19 DIAGNOSIS — IMO0001 Reserved for inherently not codable concepts without codable children: Secondary | ICD-10-CM | POA: Insufficient documentation

## 2012-08-19 DIAGNOSIS — R112 Nausea with vomiting, unspecified: Secondary | ICD-10-CM | POA: Insufficient documentation

## 2012-08-19 DIAGNOSIS — K219 Gastro-esophageal reflux disease without esophagitis: Secondary | ICD-10-CM | POA: Insufficient documentation

## 2012-08-19 DIAGNOSIS — Z8669 Personal history of other diseases of the nervous system and sense organs: Secondary | ICD-10-CM | POA: Insufficient documentation

## 2012-08-19 DIAGNOSIS — Z3202 Encounter for pregnancy test, result negative: Secondary | ICD-10-CM | POA: Insufficient documentation

## 2012-08-19 DIAGNOSIS — Z79899 Other long term (current) drug therapy: Secondary | ICD-10-CM | POA: Insufficient documentation

## 2012-08-19 DIAGNOSIS — M129 Arthropathy, unspecified: Secondary | ICD-10-CM | POA: Insufficient documentation

## 2012-08-19 DIAGNOSIS — E785 Hyperlipidemia, unspecified: Secondary | ICD-10-CM | POA: Insufficient documentation

## 2012-08-19 DIAGNOSIS — Z794 Long term (current) use of insulin: Secondary | ICD-10-CM | POA: Insufficient documentation

## 2012-08-19 DIAGNOSIS — E1029 Type 1 diabetes mellitus with other diabetic kidney complication: Secondary | ICD-10-CM | POA: Insufficient documentation

## 2012-08-19 DIAGNOSIS — R197 Diarrhea, unspecified: Secondary | ICD-10-CM | POA: Insufficient documentation

## 2012-08-19 DIAGNOSIS — J42 Unspecified chronic bronchitis: Secondary | ICD-10-CM | POA: Insufficient documentation

## 2012-08-19 DIAGNOSIS — F17200 Nicotine dependence, unspecified, uncomplicated: Secondary | ICD-10-CM | POA: Insufficient documentation

## 2012-08-19 DIAGNOSIS — N289 Disorder of kidney and ureter, unspecified: Secondary | ICD-10-CM | POA: Insufficient documentation

## 2012-08-19 DIAGNOSIS — I129 Hypertensive chronic kidney disease with stage 1 through stage 4 chronic kidney disease, or unspecified chronic kidney disease: Secondary | ICD-10-CM | POA: Insufficient documentation

## 2012-08-19 DIAGNOSIS — N390 Urinary tract infection, site not specified: Secondary | ICD-10-CM

## 2012-08-19 LAB — COMPREHENSIVE METABOLIC PANEL
ALT: 20 U/L (ref 0–35)
AST: 30 U/L (ref 0–37)
Albumin: 4.5 g/dL (ref 3.5–5.2)
Alkaline Phosphatase: 72 U/L (ref 39–117)
BUN: 13 mg/dL (ref 6–23)
CO2: 23 mEq/L (ref 19–32)
Calcium: 10.1 mg/dL (ref 8.4–10.5)
Chloride: 95 mEq/L — ABNORMAL LOW (ref 96–112)
Creatinine, Ser: 0.64 mg/dL (ref 0.50–1.10)
GFR calc Af Amer: >90 mL/min (ref >90)
GFR calc non Af Amer: >90 mL/min (ref >90)
Glucose, Bld: 262 mg/dL — ABNORMAL HIGH (ref 70–99)
Potassium: 3.9 mEq/L (ref 3.5–5.1)
Sodium: 136 mEq/L (ref 135–145)
Total Bilirubin: 0.8 mg/dL (ref 0.3–1.2)
Total Protein: 8.4 g/dL — ABNORMAL HIGH (ref 6.0–8.3)

## 2012-08-19 LAB — CBC WITH DIFFERENTIAL/PLATELET
Basophils Absolute: 0 10*3/uL (ref 0.0–0.1)
Basophils Relative: 0 % (ref 0–1)
Eosinophils Absolute: 0 10*3/uL (ref 0.0–0.7)
Eosinophils Relative: 0 % (ref 0–5)
HCT: 38.3 % (ref 36.0–46.0)
Hemoglobin: 13.1 g/dL (ref 12.0–15.0)
Lymphocytes Relative: 4 % — ABNORMAL LOW (ref 12–46)
Lymphs Abs: 0.7 10*3/uL (ref 0.7–4.0)
MCH: 30.2 pg (ref 26.0–34.0)
MCHC: 34.2 g/dL (ref 30.0–36.0)
MCV: 88.2 fL (ref 78.0–100.0)
Monocytes Absolute: 0.5 10*3/uL (ref 0.1–1.0)
Monocytes Relative: 3 % (ref 3–12)
Neutro Abs: 15.5 10*3/uL — ABNORMAL HIGH (ref 1.7–7.7)
Neutrophils Relative %: 93 % — ABNORMAL HIGH (ref 43–77)
Platelets: 225 10*3/uL (ref 150–400)
RBC: 4.34 MIL/uL (ref 3.87–5.11)
RDW: 14.9 % (ref 11.5–15.5)
WBC: 16.7 10*3/uL — ABNORMAL HIGH (ref 4.0–10.5)

## 2012-08-19 LAB — URINALYSIS, ROUTINE W REFLEX MICROSCOPIC
Bilirubin Urine: NEGATIVE
Glucose, UA: >1000 mg/dL — AB
Ketones, ur: 40 mg/dL — AB
Nitrite: POSITIVE — AB
Protein, ur: 30 mg/dL — AB
Specific Gravity, Urine: 1.018 (ref 1.005–1.030)
Urobilinogen, UA: 0.2 mg/dL (ref 0.0–1.0)
pH: 7 (ref 5.0–8.0)

## 2012-08-19 LAB — URINE MICROSCOPIC-ADD ON

## 2012-08-19 LAB — POCT PREGNANCY, URINE: Preg Test, Ur: NEGATIVE

## 2012-08-19 MED ORDER — CIPROFLOXACIN IN D5W 400 MG/200ML IV SOLN
400.0000 mg | Freq: Once | INTRAVENOUS | Status: AC
  Administered 2012-08-19: 400 mg via INTRAVENOUS
  Filled 2012-08-19: qty 200

## 2012-08-19 MED ORDER — ONDANSETRON HCL 4 MG/2ML IJ SOLN
INTRAMUSCULAR | Status: AC
  Administered 2012-08-19: 4 mg via INTRAVENOUS
  Filled 2012-08-19: qty 2

## 2012-08-19 MED ORDER — PROMETHAZINE HCL 25 MG PO TABS: 25.0000 mg | ORAL_TABLET | Freq: Four times a day (QID) | ORAL | Status: DC | PRN

## 2012-08-19 MED ORDER — CIPROFLOXACIN HCL 500 MG PO TABS: 500.0000 mg | ORAL_TABLET | Freq: Two times a day (BID) | ORAL | Status: DC

## 2012-08-19 MED ORDER — PROMETHAZINE HCL 25 MG/ML IJ SOLN
12.5000 mg | Freq: Four times a day (QID) | INTRAMUSCULAR | Status: DC | PRN
  Filled 2012-08-19: qty 1

## 2012-08-19 MED ORDER — PROMETHAZINE HCL 25 MG/ML IJ SOLN
25.0000 mg | Freq: Four times a day (QID) | INTRAMUSCULAR | Status: DC | PRN
  Administered 2012-08-19: 25 mg via INTRAVENOUS
  Filled 2012-08-19: qty 1

## 2012-08-19 MED ORDER — SODIUM CHLORIDE 0.9 % IV BOLUS (SEPSIS)
1000.0000 mL | Freq: Once | INTRAVENOUS | Status: AC
  Administered 2012-08-19: 1000 mL via INTRAVENOUS

## 2012-08-19 MED ORDER — ONDANSETRON HCL 4 MG/2ML IJ SOLN: 4.0000 mg | Freq: Once | INTRAMUSCULAR | Status: AC

## 2012-08-19 NOTE — ED Notes (Signed)
Pt sleeping. 

## 2012-08-19 NOTE — ED Notes (Signed)
Pt states still nauseated, PA made aware, ordered new medication.

## 2012-08-19 NOTE — ED Provider Notes (Signed)
History    CSN: [id number] Arrival date & time 08/19/12  0950  First MD Initiated Contact with Patient 08/19/12 [phone]     Chief Complaint  Patient presents with  . Emesis   (Consider location/radiation/quality/duration/timing/severity/associated sxs/prior Treatment) HPI Patient presents to the emergency Department, nausea, vomiting, and diarrhea for the last 2 days.  Patient, states, that 2 days, ago she had alcoholic drinks in excess of her usual amount.  Patient, states it started after that.  Patient denies chest pain, shortness of breath, weakness, numbness, dizziness, headache, blurred vision, dysuria, fever, chills, abdominal pain, or syncope.  Patient, states, that her blood sugars have been running somewhat high, since the vomiting started.  Patient, states she did not take any medications prior to arrival for her symptoms.  Patient, states nothing seems to make her condition, better or worse. Past Medical History  Diagnosis Date  . Hyperlipidemia   . Hypertension   . Diabetes mellitus     A1c > 11. Patient does not check BG or take insulin regularly.  Dx at age 32.  . Tobacco abuse   . Incarceration     Was in jail for selling drugs - out Oct 2010.  [name] Renal disorder    Past Surgical History  Procedure Laterality Date  . Tubal ligation  2009   No family history on file. History  Substance Use Topics  . Smoking status: Current Every Day Smoker -- 0.50 packs/day    Types: Cigarettes  . Smokeless tobacco: Never Used     Comment: Quit 09/21/2010 while hospitalilzed/restarted again.   . Alcohol Use: 0.5 oz/week    1 drink(s) per week     Comment: Occasional.   OB History   Grav Para Term Preterm Abortions TAB SAB Ect Mult Living                 Review of Systems All other systems negative except as documented in the HPI. All pertinent positives and negatives as reviewed in the HPI. Allergies  Review of patient's allergies indicates no known allergies.  Home  Medications   Current Outpatient Rx  Name  Route  Sig  Dispense  Refill  . ibuprofen (ADVIL,MOTRIN) 800 MG tablet   Oral   Take 800 mg by mouth every 8 (eight) hours as needed. For pain         . insulin aspart (NOVOLOG) 100 UNIT/ML injection   Subcutaneous   Inject 5-6 Units into the skin 3 (three) times daily as needed. For CBG over 180         . insulin glargine (LANTUS) 100 UNIT/ML injection   Subcutaneous   Inject 30 Units into the skin at bedtime.         . metoCLOPramide (REGLAN) 10 MG tablet   Oral   Take 1 tablet (10 mg total) by mouth 3 (three) times daily before meals.   90 tablet   3   . [employer] Lancets Original MISC   Does not apply   1 Stick by Does not apply route 4 (four) times daily -  before meals and at bedtime.   120 each   12   . glucose blood (ACCU-CHEK AVIVA) test strip      Use as instructed   100 each   12   . Multiple Vitamin (MULTIVITAMIN WITH MINERALS) TABS   Oral   Take 1 tablet by mouth every other day.          . naproxen (  NAPROSYN) 500 MG tablet   Oral   Take 1 tablet (500 mg total) by mouth 2 (two) times daily with a meal.   30 tablet   0    BP 148/79  Pulse 96  Resp 16  SpO2 98% Physical Exam  Nursing note and vitals reviewed. Constitutional: She is oriented to person, place, and time. She appears well-developed and well-nourished. No distress.  HENT:  Head: Normocephalic and atraumatic.  Mouth/Throat: Oropharynx is clear and moist.  Eyes: Pupils are equal, round, and reactive to light.  Neck: Normal range of motion. Neck supple.  Cardiovascular: Normal rate, regular rhythm and normal heart sounds.   Pulmonary/Chest: Effort normal and breath sounds normal. No respiratory distress.  Abdominal: Soft. Bowel sounds are normal. She exhibits no distension. There is no tenderness. There is no guarding.  Neurological: She is alert and oriented to person, place, and time. She exhibits normal muscle tone. Coordination normal.   Skin: Skin is warm and dry. No rash noted.    ED Course  Procedures (including critical care time) Labs Reviewed  COMPREHENSIVE METABOLIC PANEL - Abnormal; Notable for the following:    Chloride 95 (*)    Glucose, Bld 262 (*)    Total Protein 8.4 (*)    All other components within normal limits  GLUCOSE, CAPILLARY - Abnormal; Notable for the following:    Glucose-Capillary 224 (*)    All other components within normal limits  CBC WITH DIFFERENTIAL  URINALYSIS, ROUTINE W REFLEX MICROSCOPIC  CBC WITH DIFFERENTIAL   Patient is not showing any signs of DKA, at this time.  Patient has tolerated oral fluids, and saltine crackers.  Patient is advised to return here as needed.  I also advised her to followup with her primary care Dr. patient is told to slowly increase her fluid intake.  H.  Initiated IV fluids here in the emergency department.  Also, based on her testing this is most likely that this is gastroenteritis, possibly from the alcohol intake.  MDM  MDM Reviewed: vitals, nursing note and previous chart Reviewed previous: labs Interpretation: labs      Carlyle Dolly, PA-C 08/19/12 1408

## 2012-08-19 NOTE — ED Provider Notes (Signed)
Medical screening examination/treatment/procedure(s) were performed by non-physician practitioner and as supervising physician I was immediately available for consultation/collaboration.   Torell Minder, MD 08/19/12 1507 

## 2012-08-19 NOTE — ED Notes (Signed)
Pt reports vomited blood x 2 days. Reports known diabetic. Had 4-5 drinks of liquor 2 days ago and began to vomit. Pain generalized across abdomen. Also reports a couple episodes of diarrhea. No fever/chills.

## 2012-08-20 ENCOUNTER — Encounter (HOSPITAL_COMMUNITY): Payer: Self-pay | Admitting: Emergency Medicine

## 2012-08-20 ENCOUNTER — Inpatient Hospital Stay (HOSPITAL_COMMUNITY)
Admission: EM | Admit: 2012-08-20 | Discharge: 2012-08-25 | DRG: 690 | Disposition: A | Discharge status: Home / Self Care | Payer: Medicaid Other | Attending: Family Medicine | Admitting: Family Medicine

## 2012-08-20 DIAGNOSIS — E1065 Type 1 diabetes mellitus with hyperglycemia: Secondary | ICD-10-CM

## 2012-08-20 DIAGNOSIS — E876 Hypokalemia: Secondary | ICD-10-CM | POA: Diagnosis present

## 2012-08-20 DIAGNOSIS — A498 Other bacterial infections of unspecified site: Secondary | ICD-10-CM | POA: Diagnosis present

## 2012-08-20 DIAGNOSIS — F17200 Nicotine dependence, unspecified, uncomplicated: Secondary | ICD-10-CM | POA: Diagnosis present

## 2012-08-20 DIAGNOSIS — I10 Essential (primary) hypertension: Secondary | ICD-10-CM | POA: Diagnosis present

## 2012-08-20 DIAGNOSIS — E103599 Type 1 diabetes mellitus with proliferative diabetic retinopathy without macular edema, unspecified eye: Secondary | ICD-10-CM | POA: Diagnosis present

## 2012-08-20 DIAGNOSIS — D649 Anemia, unspecified: Secondary | ICD-10-CM

## 2012-08-20 DIAGNOSIS — K3184 Gastroparesis: Secondary | ICD-10-CM | POA: Diagnosis present

## 2012-08-20 DIAGNOSIS — D62 Acute posthemorrhagic anemia: Secondary | ICD-10-CM | POA: Diagnosis not present

## 2012-08-20 DIAGNOSIS — Z794 Long term (current) use of insulin: Secondary | ICD-10-CM

## 2012-08-20 DIAGNOSIS — IMO0002 Reserved for concepts with insufficient information to code with codable children: Secondary | ICD-10-CM | POA: Diagnosis present

## 2012-08-20 DIAGNOSIS — R112 Nausea with vomiting, unspecified: Secondary | ICD-10-CM | POA: Diagnosis present

## 2012-08-20 DIAGNOSIS — R1115 Cyclical vomiting syndrome unrelated to migraine: Secondary | ICD-10-CM

## 2012-08-20 DIAGNOSIS — R1110 Vomiting, unspecified: Secondary | ICD-10-CM

## 2012-08-20 DIAGNOSIS — N390 Urinary tract infection, site not specified: Principal | ICD-10-CM | POA: Diagnosis present

## 2012-08-20 HISTORY — DX: Unspecified chronic bronchitis: J42

## 2012-08-20 HISTORY — DX: Shortness of breath: R06.02

## 2012-08-20 HISTORY — DX: Unspecified osteoarthritis, unspecified site: M19.90

## 2012-08-20 HISTORY — DX: Gastro-esophageal reflux disease without esophagitis: K21.9

## 2012-08-20 HISTORY — DX: Headache: R51

## 2012-08-20 HISTORY — DX: Type 1 diabetes mellitus without complications: E10.9

## 2012-08-20 LAB — COMPREHENSIVE METABOLIC PANEL
BUN: 21 mg/dL (ref 6–23)
CO2: 29 mEq/L (ref 19–32)
Calcium: 9.8 mg/dL (ref 8.4–10.5)
Creatinine, Ser: 0.88 mg/dL (ref 0.50–1.10)
GFR calc Af Amer: >90 mL/min (ref >90)
GFR calc non Af Amer: 87 mL/min — ABNORMAL LOW (ref >90)
Glucose, Bld: 296 mg/dL — ABNORMAL HIGH (ref 70–99)
Total Protein: 8.4 g/dL — ABNORMAL HIGH (ref 6.0–8.3)

## 2012-08-20 LAB — GLUCOSE, CAPILLARY: Glucose-Capillary: 266 mg/dL — ABNORMAL HIGH (ref 70–99)

## 2012-08-20 LAB — CBC WITH DIFFERENTIAL/PLATELET
Eosinophils Absolute: 0 10*3/uL (ref 0.0–0.7)
Eosinophils Relative: 0 % (ref 0–5)
HCT: 37.5 % (ref 36.0–46.0)
Lymphocytes Relative: 6 % — ABNORMAL LOW (ref 12–46)
Lymphs Abs: 1.1 10*3/uL (ref 0.7–4.0)
MCH: 30.7 pg (ref 26.0–34.0)
MCV: 88.4 fL (ref 78.0–100.0)
Monocytes Absolute: 1.1 10*3/uL — ABNORMAL HIGH (ref 0.1–1.0)
Platelets: 281 10*3/uL (ref 150–400)
RBC: 4.24 MIL/uL (ref 3.87–5.11)
WBC: 18.2 10*3/uL — ABNORMAL HIGH (ref 4.0–10.5)

## 2012-08-20 LAB — POCT I-STAT, CHEM 8
Creatinine, Ser: 1.1 mg/dL (ref 0.50–1.10)
HCT: 42 % (ref 36.0–46.0)
Hemoglobin: 14.3 g/dL (ref 12.0–15.0)
Potassium: 4.3 mEq/L (ref 3.5–5.1)
Sodium: 145 mEq/L (ref 135–145)
TCO2: 27 mmol/L (ref 0–100)

## 2012-08-20 MED ORDER — ONDANSETRON HCL 4 MG/2ML IJ SOLN
INTRAMUSCULAR | Status: AC
  Filled 2012-08-20: qty 2

## 2012-08-20 MED ORDER — SODIUM CHLORIDE 0.9 % IV BOLUS (SEPSIS)
1000.0000 mL | Freq: Once | INTRAVENOUS | Status: AC
  Administered 2012-08-20: 1000 mL via INTRAVENOUS

## 2012-08-20 MED ORDER — ONDANSETRON HCL 4 MG/2ML IJ SOLN
4.0000 mg | Freq: Four times a day (QID) | INTRAMUSCULAR | Status: DC | PRN
  Administered 2012-08-20 – 2012-08-22 (×3): 4 mg via INTRAVENOUS
  Filled 2012-08-20 (×3): qty 2

## 2012-08-20 MED ORDER — DEXTROSE 5 % IV SOLN: 1.0000 g | Freq: Once | INTRAVENOUS | Status: DC

## 2012-08-20 MED ORDER — ONDANSETRON HCL 4 MG/2ML IJ SOLN
4.0000 mg | Freq: Once | INTRAMUSCULAR | Status: AC
  Administered 2012-08-20: 4 mg via INTRAVENOUS

## 2012-08-20 MED ORDER — ONDANSETRON HCL 4 MG/2ML IJ SOLN: 4.0000 mg | Freq: Once | INTRAMUSCULAR | Status: DC

## 2012-08-20 MED ORDER — HYDROMORPHONE HCL PF 1 MG/ML IJ SOLN
1.0000 mg | INTRAMUSCULAR | Status: DC | PRN
  Administered 2012-08-21 – 2012-08-23 (×5): 1 mg via INTRAVENOUS
  Filled 2012-08-20 (×5): qty 1

## 2012-08-20 MED ORDER — HYDRALAZINE HCL 20 MG/ML IJ SOLN
5.0000 mg | INTRAMUSCULAR | Status: DC | PRN
  Administered 2012-08-22: 5 mg via INTRAVENOUS
  Filled 2012-08-20 (×2): qty 1

## 2012-08-20 MED ORDER — HYDROMORPHONE HCL PF 1 MG/ML IJ SOLN
0.5000 mg | Freq: Once | INTRAMUSCULAR | Status: AC
  Administered 2012-08-20: 0.5 mg via INTRAVENOUS
  Filled 2012-08-20: qty 1

## 2012-08-20 MED ORDER — DEXTROSE 5 % IV SOLN
1.0000 g | Freq: Once | INTRAVENOUS | Status: AC
  Administered 2012-08-20: 1 g via INTRAVENOUS
  Filled 2012-08-20: qty 10

## 2012-08-20 MED ORDER — HEPARIN SODIUM (PORCINE) 5000 UNIT/ML IJ SOLN
5000.0000 [IU] | Freq: Three times a day (TID) | INTRAMUSCULAR | Status: DC
  Administered 2012-08-21 – 2012-08-25 (×14): 5000 [IU] via SUBCUTANEOUS
  Filled 2012-08-20 (×19): qty 1

## 2012-08-20 MED ORDER — INSULIN GLARGINE 100 UNIT/ML ~~LOC~~ SOLN
20.0000 [IU] | Freq: Every day | SUBCUTANEOUS | Status: DC
  Administered 2012-08-21 (×2): 20 [IU] via SUBCUTANEOUS
  Filled 2012-08-20 (×3): qty 0.2

## 2012-08-20 MED ORDER — DEXTROSE 5 % IV SOLN
1.0000 g | INTRAVENOUS | Status: DC
  Administered 2012-08-21 – 2012-08-23 (×3): 1 g via INTRAVENOUS
  Filled 2012-08-20 (×4): qty 10

## 2012-08-20 MED ORDER — ONDANSETRON HCL 4 MG PO TABS: 4.0000 mg | ORAL_TABLET | Freq: Four times a day (QID) | ORAL | Status: DC | PRN

## 2012-08-20 MED ORDER — KCL IN DEXTROSE-NACL 10-5-0.45 MEQ/L-%-% IV SOLN
INTRAVENOUS | Status: DC
  Administered 2012-08-21 (×3): via INTRAVENOUS
  Filled 2012-08-20 (×5): qty 1000

## 2012-08-20 MED ORDER — INSULIN ASPART 100 UNIT/ML ~~LOC~~ SOLN
0.0000 [IU] | SUBCUTANEOUS | Status: DC
  Administered 2012-08-21 (×2): 2 [IU] via SUBCUTANEOUS
  Administered 2012-08-21: 3 [IU] via SUBCUTANEOUS
  Administered 2012-08-21: 5 [IU] via SUBCUTANEOUS
  Administered 2012-08-21 – 2012-08-22 (×3): 2 [IU] via SUBCUTANEOUS
  Administered 2012-08-22: 3 [IU] via SUBCUTANEOUS
  Administered 2012-08-22: 5 [IU] via SUBCUTANEOUS
  Administered 2012-08-22 – 2012-08-23 (×2): 3 [IU] via SUBCUTANEOUS
  Administered 2012-08-23 (×3): 2 [IU] via SUBCUTANEOUS
  Administered 2012-08-23: 3 [IU] via SUBCUTANEOUS
  Administered 2012-08-24: 13:00:00 via SUBCUTANEOUS
  Administered 2012-08-24: 3 [IU] via SUBCUTANEOUS
  Administered 2012-08-24: 19:00:00 via SUBCUTANEOUS
  Administered 2012-08-24: 7 [IU] via SUBCUTANEOUS
  Administered 2012-08-25: 2 [IU] via SUBCUTANEOUS

## 2012-08-20 MED ORDER — HYDROMORPHONE HCL PF 1 MG/ML IJ SOLN: 0.5000 mg | Freq: Once | INTRAMUSCULAR | Status: DC

## 2012-08-20 MED ORDER — PROMETHAZINE HCL 25 MG/ML IJ SOLN
25.0000 mg | Freq: Four times a day (QID) | INTRAMUSCULAR | Status: DC | PRN
  Administered 2012-08-21 – 2012-08-23 (×7): 25 mg via INTRAVENOUS
  Filled 2012-08-20 (×7): qty 1

## 2012-08-20 NOTE — ED Notes (Signed)
Pt c/o N/V x 3 days with blood; pt sts seen here for same and discharged

## 2012-08-20 NOTE — H&P (Signed)
Family Medicine Teaching [facility] Admission History and Physical Service Pager: [phone]  Patient name: [name] Medical record number: [id number] Date of birth: 03-28-1980 Age: 32 y.o. Gender: female  Primary Care Provider: Marena Chancy, MD Consultants: None Code Status: Full  Chief Complaint: vomiting  Assessment and Plan: [name] is a 32 y.o. year old female presenting with UTI and intractable vomiting, failing outpatient treatment. PMH is significant for HTN and Type I DM.  UTI - Diagnosed yesterday on UA  with culture showing >100k E.coli. Started on outpatient antibiotics which she was unable to tolerate due to vomiting. WBC still elevated to 18. - Admit to med-surg, attending Dr. Jennette Kettle - Given Rocephin in ED, continue Rocephin IV q24 hours while not able to tolerate PO. Will transition to Keflex po when able - Continue fluids and antiemetics - Dilaudid prn pain - Afebrile at this time, but subjective chills and back pain could be suggestive of pyelonephritis. Monitor for improvement.  Nausea/Vomiting - Has history of gastroparesis, and required hospitalization in Feb of this year for same. N/V could also be due to UTI or hyperglycemia.  - s/p bolus in the ED - Continue IVF at 125 cc/hr (with dextrose only while NPO) - Zofran prn for nausea, can alternate with phenergan if needed - Consider Reglan if continues for possible gastroparesis symptoms - Sips and chips, but advance diet as tolerated. - Patient reports "red" emesis at time but HgB stable. Asked to save emesis if she sees this again  DM - Long standing history since age 86. Last A1C 10.0 last month. On Lantus 30units and mealtime coverage at home. Glucose elevated but no gap on iStat Bmet so not likely DKA. - Monitor CBG q4 hours while NPO - Will give lower dose of Lantus while not eating, 20 units - Sensitive SSI  HTN - BP elevated in ED but vomiting. Also tachycardic.  - I do not see any  home medications, and patient states her BP only goes up when she is sick - Give Hydralazine 5mg  IV q4 prn for SBP >180 or DBP >100. - Transition to PO medications if needed  FEN/GI: NPO until tolerating PO. D51/2NS + 10 KCl at 125 cc/hr (D5 due to type 1 DMl; d/c dextrose when taking po) Prophylaxis: Heparin SQ. Protonix  Disposition: Home pending clinical improvement  History of Present Illness: [name] is a 32 y.o. year old female presenting for second evaluation in last 24 hours for nausea and vomiting. Was seen in the ED yesterday and diagnosed with UTI (culture grew E.coli.) Unable to tolerate outpatient PO Cipro due to vomiting so she returned to the ED to be evaluated. Vomiting started yesterday and has been getting progressively worse since it started. Unable to tolerate anything by mouth. She describes that the emesis is mostly bilious but has had a few episodes of a "red appearance". She endorses abdominal pain, mostly epigastric. It is worse with vomiting, and nothing makes it better. She has had similar episodes in the past, which was diagnosed as gastroparesis. She was on reglan during past hospitalizations but not as an outpatient. Currently, she endorses no fevers but does state she has had some chills. She has been around some sick contacts but none with GI issues, and she denies travel or new food exposures.   ED Course: Vomiting in the ED, and slightly tachycardic. She was given one liter bolus in the ED and rocephin for UTI. Unable to draw labs so  had to get femoral stick for electrolytes. FPTS was called for admission. WBC elevated to 18, electrolytes wnl and normal anion gap.  Review Of Systems: Per HPI. Otherwise 12 point review of systems was performed and was unremarkable.  Patient Active Problem List   Diagnosis Date Noted  . Hypokalemia 04/19/2012  . Anemia 04/19/2012  . Essential hypertension, benign 04/19/2012  . Rash and nonspecific skin eruption 04/19/2012   . Intractable nausea and vomiting 03/11/2012  . Gastroparesis 03/11/2012  . Urinary tract infection 01/18/2012  . Vaginitis 08/07/2011  . TRIGGER FINGER, RIGHT THUMB 05/24/2009  . TOBACCO USER 11/07/2008  . Diabetes mellitus type 1, uncontrolled, insulin dependent 04/02/2006  . HYPERCHOLESTEROLEMIA 03/29/2006   Past Medical History: Past Medical History  Diagnosis Date  . Hyperlipidemia   . Hypertension   . Diabetes mellitus     A1c > 11. Patient does not check BG or take insulin regularly.  Dx at age 10.  . Tobacco abuse   . Incarceration     Was in jail for selling drugs - out Oct 2010.  [name] Renal disorder    Past Surgical History: Past Surgical History  Procedure Laterality Date  . Tubal ligation  2009   Social History: History  Substance Use Topics  . Smoking status: Current Every Day Smoker -- 0.50 packs/day    Types: Cigarettes  . Smokeless tobacco: Never Used     Comment: Quit 09/21/2010 while hospitalilzed/restarted again.   . Alcohol Use: 0.5 oz/week    1 drink(s) per week     Comment: Occasional.   Please also refer to relevant sections of EMR.  Family History: History reviewed. No pertinent family history. Allergies and Medications: No Known Allergies No current facility-administered medications on file prior to encounter.   Current Outpatient Prescriptions on File Prior to Encounter  Medication Sig Dispense Refill  . insulin aspart (NOVOLOG) 100 UNIT/ML injection Inject 5-6 Units into the skin 3 (three) times daily as needed. For CBG over 180      . insulin glargine (LANTUS) 100 UNIT/ML injection Inject 30 Units into the skin at bedtime.      . Multiple Vitamin (MULTIVITAMIN WITH MINERALS) TABS Take 1 tablet by mouth every other day.       . promethazine (PHENERGAN) 25 MG tablet Take 1 tablet (25 mg total) by mouth every 6 (six) hours as needed for nausea.  10 tablet  0  . [employer] Lancets Original MISC 1 Stick by Does not apply route 4 (four) times daily -   before meals and at bedtime.  120 each  12  . glucose blood (ACCU-CHEK AVIVA) test strip Use as instructed  100 each  12    Objective: BP 158/103  Pulse 116  Temp(Src) 98.6 F (37 C) (Oral)  Resp 18  SpO2 99% Exam: General: Sitting up in bed, appears uncomfortable. HEENT: Dry lips and tongue. No posterior pharyngeal erythema or edema. Full extraocular movement, pupils equal and reactive Cardiovascular: Tachycardic, no murmur appreciated Respiratory: Good effort, CTAB Abdomen: +BS, soft. Mild TTP in epigastrium. No guarding or rebound. ? Left CVA tenderness Extremities: Moves all extremities Skin: warm and dry Neuro: grossly intact  Labs and Imaging: CBC BMET   Recent Labs Lab 08/20/12 1828 08/20/12 2008  WBC 18.2*  --   HGB 13.0 14.3  HCT 37.5 42.0  PLT 281  --     Recent Labs Lab 08/20/12 1843 08/20/12 2008  NA 145 145  K 4.4 4.3  CL 101  106  CO2 29  --   BUN 21 21  CREATININE 0.88 1.10  GLUCOSE 296* 309*  CALCIUM 9.8  --      Urinalysis  08/19/2012 14:14  Color, Urine YELLOW  APPearance CLEAR  Specific Gravity, Urine 1.018  pH 7.0  Glucose >1000 (A)  Bilirubin Urine NEGATIVE  Ketones, ur 40 (A)  Protein 30 (A)  Urobilinogen, UA 0.2  Nitrite POSITIVE (A)  Leukocytes, UA MODERATE (A)  Hgb urine dipstick SMALL (A)  WBC, UA TOO NUMEROUS TO COUNT  RBC / HPF 0-2  Squamous Epithelial / LPF FEW (A)  Bacteria, UA MANY (A)   Urine culture >100k E.coli, sensitivities pending  Hilarie Fredrickson, MD 08/20/2012, 9:12 PM PGY-3, [facility] FPTS Intern pager: [phone], text pages welcome

## 2012-08-20 NOTE — ED Provider Notes (Signed)
History    CSN: [id number] Arrival date & time 08/20/12  1756  First MD Initiated Contact with Patient 08/20/12 1824     Chief Complaint  Patient presents with  . Emesis   (Consider location/radiation/quality/duration/timing/severity/associated sxs/prior Treatment) Patient is a 32 y.o. female presenting with vomiting. The history is provided by the patient (the pt complains of continued vomiting). No language interpreter was used.  Emesis Severity:  Moderate Timing:  Constant Quality:  Malodorous material Able to tolerate:  Liquids Progression:  Worsening Chronicity:  Recurrent Recent urination:  Increased Associated symptoms: no abdominal pain, no diarrhea and no headaches    Past Medical History  Diagnosis Date  . Hyperlipidemia   . Hypertension   . Diabetes mellitus     A1c > 11. Patient does not check BG or take insulin regularly.  Dx at age 17.  . Tobacco abuse   . Incarceration     Was in jail for selling drugs - out Oct 2010.  [name] Renal disorder    Past Surgical History  Procedure Laterality Date  . Tubal ligation  2009   History reviewed. No pertinent family history. History  Substance Use Topics  . Smoking status: Current Every Day Smoker -- 0.50 packs/day    Types: Cigarettes  . Smokeless tobacco: Never Used     Comment: Quit 09/21/2010 while hospitalilzed/restarted again.   . Alcohol Use: 0.5 oz/week    1 drink(s) per week     Comment: Occasional.   OB History   Grav Para Term Preterm Abortions TAB SAB Ect Mult Living                 Review of Systems  Constitutional: Negative for appetite change and fatigue.  HENT: Negative for congestion, sinus pressure and ear discharge.   Eyes: Negative for discharge.  Respiratory: Negative for cough.   Cardiovascular: Negative for chest pain.  Gastrointestinal: Positive for nausea and vomiting. Negative for abdominal pain and diarrhea.  Genitourinary: Negative for frequency and hematuria.  Musculoskeletal:  Negative for back pain.  Skin: Negative for rash.  Neurological: Negative for seizures and headaches.  Psychiatric/Behavioral: Negative for hallucinations.    Allergies  Review of patient's allergies indicates no known allergies.  Home Medications   Current Outpatient Rx  Name  Route  Sig  Dispense  Refill  . ciprofloxacin (CIPRO) 500 MG tablet   Oral   Take 500 mg by mouth every 12 (twelve) hours. Take x10 days. Began regimen on 08/19/12.         [name] insulin aspart (NOVOLOG) 100 UNIT/ML injection   Subcutaneous   Inject 5-6 Units into the skin 3 (three) times daily as needed. For CBG over 180         . insulin glargine (LANTUS) 100 UNIT/ML injection   Subcutaneous   Inject 30 Units into the skin at bedtime.         . Multiple Vitamin (MULTIVITAMIN WITH MINERALS) TABS   Oral   Take 1 tablet by mouth every other day.          . promethazine (PHENERGAN) 25 MG tablet   Oral   Take 1 tablet (25 mg total) by mouth every 6 (six) hours as needed for nausea.   10 tablet   0   . [employer]S Lancets Original MISC   Does not apply   1 Stick by Does not apply route 4 (four) times daily -  before meals and at bedtime.   120 each  12   . glucose blood (ACCU-CHEK AVIVA) test strip      Use as instructed   100 each   12    BP 175/100  Pulse 111  Temp(Src) 98.6 F (37 C) (Oral)  Resp 18  SpO2 100% Physical Exam  Constitutional: She is oriented to person, place, and time. She appears well-developed.  HENT:  Head: Normocephalic.  Eyes: Conjunctivae and EOM are normal. No scleral icterus.  Neck: Neck supple. No thyromegaly present.  Cardiovascular: Normal rate and regular rhythm.  Exam reveals no gallop and no friction rub.   No murmur heard. Pulmonary/Chest: No stridor. She has no wheezes. She has no rales. She exhibits no tenderness.  Abdominal: She exhibits distension. There is no tenderness. There is no rebound.  Musculoskeletal: Normal range of motion. She exhibits no  edema.  Lymphadenopathy:    She has no cervical adenopathy.  Neurological: She is oriented to person, place, and time. Coordination normal.  Skin: No rash noted. No erythema.  Psychiatric: She has a normal mood and affect. Her behavior is normal.    ED Course  Procedures (including critical care time) Labs Reviewed  CBC WITH DIFFERENTIAL  COMPREHENSIVE METABOLIC PANEL  HEMOGLOBIN A1C   No results found. 1. UTI (lower urinary tract infection)   2. Vomiting    Procedure..  Femoral stick done to right femoral vein for blood. MDM    Benny Lennert, MD 08/20/12 2003

## 2012-08-21 ENCOUNTER — Encounter (HOSPITAL_COMMUNITY): Payer: Self-pay | Admitting: General Practice

## 2012-08-21 DIAGNOSIS — K3184 Gastroparesis: Secondary | ICD-10-CM

## 2012-08-21 DIAGNOSIS — R1110 Vomiting, unspecified: Secondary | ICD-10-CM

## 2012-08-21 LAB — OCCULT BLOOD GASTRIC / DUODENUM (SPECIMEN CUP): Occult Blood, Gastric: POSITIVE — AB

## 2012-08-21 LAB — GLUCOSE, CAPILLARY
Glucose-Capillary: 276 mg/dL — ABNORMAL HIGH (ref 70–99)
Glucose-Capillary: 209 mg/dL — ABNORMAL HIGH (ref 70–99)

## 2012-08-21 LAB — COMPREHENSIVE METABOLIC PANEL
AST: 19 U/L (ref 0–37)
CO2: 25 mEq/L (ref 19–32)
Chloride: 105 mEq/L (ref 96–112)
Creatinine, Ser: 0.7 mg/dL (ref 0.50–1.10)
GFR calc Af Amer: >90 mL/min (ref >90)
GFR calc non Af Amer: >90 mL/min (ref >90)
Glucose, Bld: 275 mg/dL — ABNORMAL HIGH (ref 70–99)
Total Bilirubin: 0.6 mg/dL (ref 0.3–1.2)

## 2012-08-21 LAB — URINE CULTURE: Colony Count: >=100000

## 2012-08-21 LAB — CBC
MCH: 29.8 pg (ref 26.0–34.0)
MCHC: 33.3 g/dL (ref 30.0–36.0)
Platelets: 307 10*3/uL (ref 150–400)
RBC: 3.09 MIL/uL — ABNORMAL LOW (ref 3.87–5.11)

## 2012-08-21 LAB — TYPE AND SCREEN: ABO/RH(D): B POS

## 2012-08-21 LAB — HEMOGLOBIN AND HEMATOCRIT, BLOOD: HCT: 42.1 % (ref 36.0–46.0)

## 2012-08-21 LAB — HEMOGLOBIN A1C
Hgb A1c MFr Bld: 10.3 % — ABNORMAL HIGH (ref <5.7)
Mean Plasma Glucose: 249 mg/dL — ABNORMAL HIGH (ref <117)

## 2012-08-21 MED ORDER — METOCLOPRAMIDE HCL 5 MG/ML IJ SOLN
10.0000 mg | Freq: Two times a day (BID) | INTRAMUSCULAR | Status: DC
  Administered 2012-08-21 – 2012-08-22 (×3): 10 mg via INTRAVENOUS
  Filled 2012-08-21 (×5): qty 2

## 2012-08-21 NOTE — H&P (Signed)
FMTS Attending Admission Note: Jumanah Hynson MD [phone] pager office [phone] I  have seen and examined this patient, reviewed their chart. I have discussed this patient with the resident. I agree with the resident's findings, assessment and care plan. 

## 2012-08-21 NOTE — Progress Notes (Signed)
UR completed 

## 2012-08-21 NOTE — Progress Notes (Signed)
Family Medicine Teaching Service Daily Progress Note Intern Pager: [phone]  Patient name: [name] Medical record number: [id number] Date of birth: 10-Sep-1980 Age: 32 y.o. Gender: female  Primary Care Provider: Marena Chancy, MD Consultants: None  Code Status: Full  Pt Overview and Major Events to Date:   7/22: Admitted for intractable vomiting with UTI. Rocephin started 7/23:   Assessment and Plan:  ISRAA CABAN is a 32 y.o. year old female presenting with UTI and intractable vomiting, failing outpatient treatment. PMH is significant for HTN and Type I DM.   UTI - Urine Cx 7/21 >100k E.coli. Started on outpatient antibiotics which she was unable to tolerate due to vomiting. WBC still elevated to 18.  Rocephin started 7/22 - Continue fluids and antiemetics  - Dilaudid prn pain  - Afebrile at this time no CVA tenderness, but subjective chills and leukocytosis could be suggestive of pyelonephritis. Monitor for improvement.   Nausea/Vomiting - Has history of gastroparesis, and required hospitalization in Feb of this year for same. N/V could also be due to UTI or hyperglycemia.  - s/p bolus in the ED  - Continue IVF at 125 cc/hr (with dextrose only while NPO)  - Zofran prn for nausea, can alternate with phenergan if needed  - Consider Reglan if continues for possible gastroparesis symptoms  - Sips and chips, but advance diet as tolerated.  - Patient reports "red" emesis at time but HgB stable. Asked to save emesis if she sees this again  [ ]  Emptying study as outpatient  ? Acute blood loss anemia Hgb 14.3 -> 9.2 overnight in setting of "red" emesis w/o history of PUD, gastritis.  - Type & screen, repeat H/H, will consult GI for EGD if Hgb continues downward trend.  - Repeat H/H: Hgb 14.2.  Previous CBC likely laboratory error.  No transfusion/EGD indicated.   T1DM Hb A1c: 10.3. On Lantus 30units and mealtime coverage at home.  - D5 while NPO -> high sugars - Monitor  CBG q4 hours while NPO  - Will give lower dose of Lantus while not eating, 20 units  - Sensitive SSI   HTN  - No home meds - Give Hydralazine 5mg  IV q4 prn for SBP >180 or DBP >100.  - Transition to PO medications if needed   FEN/GI: NPO until tolerating PO. D51/2NS + 10 KCl at 125 cc/hr (D5 due to type 1 DM; d/c dextrose when taking po)  Prophylaxis: Heparin SQ. Protonix  Disposition: To home pending clinical improvement  Subjective: Pt reports continued emesis/retching that is green. No fever, dysuria. Does feel like she is improving.    Objective: Temp:  [98.2 F (36.8 C)-98.6 F (37 C)] 98.6 F (37 C) (07/23 1015) Pulse Rate:  [99-118] 100 (07/23 1015) Resp:  [17-19] 19 (07/23 1015) BP: (151-175)/(90-107) 153/90 mmHg (07/23 1015) SpO2:  [95 %-100 %] 100 % (07/23 1015) Weight:  [135 lb 8 oz (61.462 kg)] 135 lb 8 oz (61.462 kg) (07/22 2125) Physical Exam: General: 32 yo female sitting up in bed appears in mild distress Cardiovascular: RRR faint SEM  Respiratory: Nonlabored, CTAB Abdomen: +BS, Soft, mild TTP, no guarding or rebound. No CVA tenderness Extremities: Moves all extremities spontaneously.   Laboratory:  Recent Labs Lab 08/19/12 1140 08/20/12 1828 08/20/12 2008 08/21/12 0520 08/21/12 0945  WBC 16.7* 18.2*  --  17.1*  --   HGB 13.1 13.0 14.3 9.2* 14.2  HCT 38.3 37.5 42.0 27.6* 42.1  PLT 225 281  --  307  --     Recent Labs Lab 08/19/12 1006 08/20/12 1843 08/20/12 2008 08/21/12 0520  NA 136 145 145 146*  K 3.9 4.4 4.3 4.6  CL 95* 101 106 105  CO2 23 29  --  25  BUN 13 21 21 17   CREATININE 0.64 0.88 1.10 0.70  CALCIUM 10.1 9.8  --  9.7  PROT 8.4* 8.4*  --  7.8  BILITOT 0.8 0.8  --  0.6  ALKPHOS 72 73  --  76  ALT 20 17  --  16  AST 30 20  --  19  GLUCOSE 262* 296* 309* 275*   Hb A1c: 10.3 Beta-hCG: Neg  Imaging/Diagnostic Tests:   Hazeline Junker, MD 08/21/2012, 1:50 PM PGY-1, [facility] FPTS Intern pager: [phone], text  pages welcome

## 2012-08-21 NOTE — Progress Notes (Signed)
Patient refused to have her admission completed stating "I will not answer any more questions, I am already admitted."

## 2012-08-21 NOTE — Progress Notes (Signed)
Discussed in rounds.  Seen today by attending, Dr. Jennette Kettle.  Agree with Dr. Jarvis Newcomer.

## 2012-08-22 LAB — GLUCOSE, CAPILLARY
Glucose-Capillary: 197 mg/dL — ABNORMAL HIGH (ref 70–99)
Glucose-Capillary: 220 mg/dL — ABNORMAL HIGH (ref 70–99)
Glucose-Capillary: 256 mg/dL — ABNORMAL HIGH (ref 70–99)
Glucose-Capillary: 236 mg/dL — ABNORMAL HIGH (ref 70–99)

## 2012-08-22 LAB — COMPREHENSIVE METABOLIC PANEL
ALT: 14 U/L (ref 0–35)
AST: 16 U/L (ref 0–37)
CO2: 29 mEq/L (ref 19–32)
Calcium: 9.2 mg/dL (ref 8.4–10.5)
Chloride: 103 mEq/L (ref 96–112)
GFR calc non Af Amer: >90 mL/min (ref >90)
Sodium: 142 mEq/L (ref 135–145)
Total Bilirubin: 0.7 mg/dL (ref 0.3–1.2)

## 2012-08-22 LAB — CBC
Platelets: 238 10*3/uL (ref 150–400)
RBC: 4.33 MIL/uL (ref 3.87–5.11)
WBC: 8.5 10*3/uL (ref 4.0–10.5)

## 2012-08-22 MED ORDER — METOCLOPRAMIDE HCL 10 MG PO TABS
10.0000 mg | ORAL_TABLET | Freq: Three times a day (TID) | ORAL | Status: DC
  Administered 2012-08-22 (×2): 10 mg via ORAL
  Filled 2012-08-22 (×7): qty 1

## 2012-08-22 MED ORDER — METOCLOPRAMIDE HCL 5 MG/ML IJ SOLN
10.0000 mg | Freq: Four times a day (QID) | INTRAMUSCULAR | Status: DC | PRN
  Administered 2012-08-22 – 2012-08-23 (×2): 10 mg via INTRAVENOUS
  Filled 2012-08-22: qty 2

## 2012-08-22 MED ORDER — BOOST / RESOURCE BREEZE PO LIQD
1.0000 | Freq: Every day | ORAL | Status: DC
  Administered 2012-08-23 – 2012-08-24 (×2): 1 via ORAL

## 2012-08-22 MED ORDER — DEXTROSE-NACL 5-0.45 % IV SOLN
INTRAVENOUS | Status: DC
  Administered 2012-08-22 – 2012-08-23 (×2): via INTRAVENOUS
  Filled 2012-08-22: qty 1000

## 2012-08-22 MED ORDER — INSULIN GLARGINE 100 UNIT/ML ~~LOC~~ SOLN
25.0000 [IU] | Freq: Every day | SUBCUTANEOUS | Status: DC
  Administered 2012-08-22 – 2012-08-24 (×3): 25 [IU] via SUBCUTANEOUS
  Filled 2012-08-22 (×4): qty 0.25

## 2012-08-22 MED ORDER — LISINOPRIL 10 MG PO TABS
10.0000 mg | ORAL_TABLET | Freq: Every day | ORAL | Status: DC
  Administered 2012-08-22 – 2012-08-25 (×4): 10 mg via ORAL
  Filled 2012-08-22 (×5): qty 1

## 2012-08-22 NOTE — Progress Notes (Signed)
INITIAL NUTRITION ASSESSMENT  DOCUMENTATION CODES Per approved criteria  -Not Applicable   INTERVENTION:  Resource Breeze daily (250 kcals, 9 gm protein per 8 fl oz carton) RD to follow for nutrition care plan  NUTRITION DIAGNOSIS: Inadequate oral intake related to poor appetite, acute illness as evidenced by patient report  Goal: Oral intake with meals & supplements to meet >/= 90% of estimated nutrition needs  Monitor:  Oral intake with meals & supplements to meet >/= 90% of estimated nutrition needs  Reason for Assessment: Malnutrition Screening Tool Report  32 y.o. female  Admitting Dx: Urinary tract infection  ASSESSMENT: Patient with PMH significant for HTN and Type I DM; presented with UTI and intractable vomiting.  Patient reports a poor appetite; she states she was not eating well 3 days PTA; patient does not know if she's lost weight; per weight readings, her weight seems to fluctuate; would benefit from supplements ---> she is willing to trying Resource Breeze supplement ---> RD to order.  Height: Ht Readings from Last 1 Encounters:  08/20/12 5\' 7"  (1.702 m)    Weight: Wt Readings from Last 1 Encounters:  08/22/12 135 lb 12.3 oz (61.584 kg)    Ideal Body Weight: 135%  % Ideal Body Weight: 100%  Wt Readings from Last 10 Encounters:  08/22/12 135 lb 12.3 oz (61.584 kg)  07/17/12 151 lb 3.2 oz (68.584 kg)  06/03/12 152 lb 4.8 oz (69.083 kg)  04/19/12 152 lb (68.947 kg)  03/15/12 145 lb 4.5 oz (65.9 kg)  03/11/12 144 lb 6.4 oz (65.499 kg)  01/16/12 152 lb (68.947 kg)  12/19/11 138 lb 11.2 oz (62.914 kg)  10/18/11 152 lb (68.947 kg)  08/07/11 151 lb 11.2 oz (68.811 kg)    Usual Body Weight: 151 lb  % Usual Body Weight: 87%  BMI:  Body mass index is 21.26 kg/(m^2).  Estimated Nutritional Needs: Kcal: 1500-1700 Protein: 70-80 gm Fluid: 1.5-1.7 L  Skin: Intact  Diet Order: Clear Liquid  EDUCATION NEEDS: -No education needs identified at  this time   Intake/Output Summary (Last 24 hours) at 08/22/12 1520 Last data filed at 08/22/12 1300  Gross per 24 hour  Intake   2221 ml  Output      0 ml  Net   2221 ml    Labs:   Recent Labs Lab 08/20/12 1843 08/20/12 2008 08/21/12 0520 08/22/12 0545  NA 145 145 146* 142  K 4.4 4.3 4.6 3.7  CL 101 106 105 103  CO2 29  --  25 29  BUN 21 21 17 9   CREATININE 0.88 1.10 0.70 0.68  CALCIUM 9.8  --  9.7 9.2  GLUCOSE 296* 309* 275* 277*    CBG (last 3)   Recent Labs  08/22/12 0408 08/22/12 0735 08/22/12 1146  GLUCAP 197* 220* 256*    Scheduled Meds: . cefTRIAXone (ROCEPHIN)  IV  1 g Intravenous Q24H  . heparin  5,000 Units Subcutaneous Q8H  . insulin aspart  0-9 Units Subcutaneous Q4H  . insulin glargine  25 Units Subcutaneous QHS  . lisinopril  10 mg Oral Daily  . metoCLOPramide  10 mg Oral TID AC & HS    Continuous Infusions: . dextrose 5 % and 0.45% NaCl 50 mL/hr at 08/22/12 1320    Past Medical History  Diagnosis Date  . Hyperlipidemia   . Hypertension   . Tobacco abuse   . Incarceration     Was in jail for selling drugs - out Oct 2010.  [name]  Renal disorder   . Type I diabetes mellitus     A1c > 11. Patient does not check BG or take insulin regularly.  Dx at age 69.  . Chronic bronchitis     "get it q yr" (08/21/2012)  . Exertional shortness of breath   . GERD (gastroesophageal reflux disease)   . ZOXWRUEA(540.9)     "weekly" (08/21/2012)  . Arthritis     "legs and hands" (08/21/2012)    Past Surgical History  Procedure Laterality Date  . Tubal ligation  2009    Maureen Chatters, [place], LDN Pager #: [phone] After-Hours Pager #: [phone]

## 2012-08-22 NOTE — Progress Notes (Signed)
Seen and examined.  Agree with Dr. Caleb Popp.  Hyperglycemia improved.  UTI being treated.  Nausea continues.  I believe she likely has some chronic/recurrent nausea problem such as diabetic gastroparesis or perhaps even cyclic vomiting syndrome.  Slowly advance diet.

## 2012-08-22 NOTE — Progress Notes (Signed)
Family Medicine Teaching Service Daily Progress Note Intern Pager: [phone]  Patient name: [name] Medical record number: [id number] Date of birth: Sep 23, 1980 Age: 32 y.o. Gender: female  Primary Care Provider: Marena Chancy, MD Consultants: None  Code Status: Full  Pt Overview and Major Events to Date:   7/22: Admitted for intractable vomiting with UTI. Rocephin started 7/24: Clear liquid diet  Assessment and Plan:  [name] is a 32 y.o. year old female presenting with UTI and intractable vomiting, failing outpatient treatment. PMH is significant for HTN and Type I DM.   UTI - Urine Cx 7/21 >100k E.coli. Started on outpatient antibiotics which she was unable to tolerate due to vomiting. WBC still elevated to 18.  Rocephin started 7/22 - Continue fluids and antiemetics  - Dilaudid prn pain  - Afebrile at this time no CVA tenderness [ ]  Monitor for improvement.   Nausea/Vomiting - Has history of gastroparesis, and required hospitalization in Feb of this year for same. N/V could also be due to UTI or hyperglycemia.  - s/p bolus in the ED  - Continue IVF at 125 cc/hr (with dextrose only while NPO)  - Zofran prn for nausea, can alternate with phenergan if needed  - (7/24) Increased Reglan to TID - advanced to clear liquid and will advance diet as tolerated; will d/c d5 if patient eats PO.  - Patient reports "red" emesis at time but HgB stable. Asked to save emesis if she sees this again  [ ]  Emptying study as outpatient  ? Acute blood loss anemia Hgb 14.3 -> 9.2 overnight in setting of "red" emesis w/o history of PUD, gastritis.  - Type & screen, repeat H/H, will consult GI for EGD if Hgb continues downward trend.  - Repeat H/H: Hgb 14.2.  Previous CBC likely laboratory error.  No transfusion/EGD indicated.   T1DM Hb A1c: 10.3. On Lantus 30units and mealtime coverage at home. Last CBG: 220, 13 units aspart last 24 hours - D5 while NPO -> high sugars - Increased  Lantus to 25 units - Sensitive SSI   HTN  - No home meds - Give Hydralazine 5mg  IV q4 prn for SBP >180 or DBP >100.  - start lisinopril 5mg   FEN/GI: NPO until tolerating PO. D51/2NS + 10 KCl at 125 cc/hr (D5 due to type 1 DM; d/c dextrose when taking po)  Prophylaxis: Heparin SQ. Protonix  Disposition: To home pending clinical improvement  Subjective: Pt reports one episode of emesis that was clear. Will try to take food PO today. No fever, dysuria. Continues to feel like she is improving.  Objective: Temp:  [98.6 F (37 C)-99.4 F (37.4 C)] 98.8 F (37.1 C) (07/24 0500) Pulse Rate:  [101-116] 101 (07/24 0500) Resp:  [17-20] 19 (07/24 0500) BP: (149-179)/(92-111) 172/104 mmHg (07/24 0500) SpO2:  [99 %-100 %] 100 % (07/24 0500) Weight:  [135 lb 12.3 oz (61.584 kg)] 135 lb 12.3 oz (61.584 kg) (07/24 0500)  Physical Exam: General: 32 yo female sitting up in bed appears in mild distress Cardiovascular: RRR faint SEM  Respiratory: Nonlabored, CTAB Abdomen: +BS, Soft, mild TTP, no guarding or rebound. No CVA tenderness Extremities: Moves all extremities spontaneously.   Laboratory:  Recent Labs Lab 08/20/12 1828  08/21/12 0520 08/21/12 0945 08/22/12 0545  WBC 18.2*  --  17.1*  --  8.5  HGB 13.0  < > 9.2* 14.2 12.7  HCT 37.5  < > 27.6* 42.1 39.9  PLT 281  --  307  --  238  < > = values in this interval not displayed.  Recent Labs Lab 08/20/12 1843 08/20/12 2008 08/21/12 0520 08/22/12 0545  NA 145 145 146* 142  K 4.4 4.3 4.6 3.7  CL 101 106 105 103  CO2 29  --  25 29  BUN 21 21 17 9   CREATININE 0.88 1.10 0.70 0.68  CALCIUM 9.8  --  9.7 9.2  PROT 8.4*  --  7.8 7.5  BILITOT 0.8  --  0.6 0.7  ALKPHOS 73  --  76 68  ALT 17  --  16 14  AST 20  --  19 16  GLUCOSE 296* 309* 275* 277*   Hb A1c: 10.3 Beta-hCG: Neg  Jacquelin Hawking, MD 08/22/2012, 1:00 PM PGY-1, [facility] FPTS Intern pager: [phone], text pages welcome

## 2012-08-23 LAB — COMPREHENSIVE METABOLIC PANEL
Albumin: 3.8 g/dL (ref 3.5–5.2)
BUN: 8 mg/dL (ref 6–23)
Creatinine, Ser: 0.77 mg/dL (ref 0.50–1.10)
GFR calc Af Amer: >90 mL/min (ref >90)
Glucose, Bld: 163 mg/dL — ABNORMAL HIGH (ref 70–99)
Total Protein: 7.9 g/dL (ref 6.0–8.3)

## 2012-08-23 LAB — GLUCOSE, CAPILLARY
Glucose-Capillary: 142 mg/dL — ABNORMAL HIGH (ref 70–99)
Glucose-Capillary: 198 mg/dL — ABNORMAL HIGH (ref 70–99)
Glucose-Capillary: 206 mg/dL — ABNORMAL HIGH (ref 70–99)
Glucose-Capillary: 232 mg/dL — ABNORMAL HIGH (ref 70–99)

## 2012-08-23 LAB — CBC
HCT: 42.7 % (ref 36.0–46.0)
Hemoglobin: 13.9 g/dL (ref 12.0–15.0)
MCH: 29.4 pg (ref 26.0–34.0)
MCHC: 32.6 g/dL (ref 30.0–36.0)
MCV: 90.3 fL (ref 78.0–100.0)
RDW: 14.7 % (ref 11.5–15.5)

## 2012-08-23 MED ORDER — PANTOPRAZOLE SODIUM 40 MG PO TBEC
40.0000 mg | DELAYED_RELEASE_TABLET | Freq: Every day | ORAL | Status: DC
  Administered 2012-08-23 – 2012-08-25 (×3): 40 mg via ORAL
  Filled 2012-08-23 (×4): qty 1

## 2012-08-23 MED ORDER — KCL IN DEXTROSE-NACL 10-5-0.45 MEQ/L-%-% IV SOLN
INTRAVENOUS | Status: DC
  Administered 2012-08-23 (×2): via INTRAVENOUS
  Administered 2012-08-24: 125 mL/h via INTRAVENOUS
  Filled 2012-08-23 (×6): qty 1000

## 2012-08-23 MED ORDER — CALCIUM CARBONATE ANTACID 500 MG PO CHEW
1.0000 | CHEWABLE_TABLET | Freq: Three times a day (TID) | ORAL | Status: DC | PRN
  Administered 2012-08-23: 200 mg via ORAL
  Filled 2012-08-23: qty 1

## 2012-08-23 MED ORDER — METOCLOPRAMIDE HCL 5 MG/ML IJ SOLN
10.0000 mg | Freq: Four times a day (QID) | INTRAMUSCULAR | Status: DC
  Administered 2012-08-23 – 2012-08-25 (×7): 10 mg via INTRAVENOUS
  Filled 2012-08-23 (×16): qty 2

## 2012-08-23 NOTE — Progress Notes (Signed)
Family Medicine PCP Note:   Greatly appreciate the excellent care provided by the FPTS team in regards to my patient in clinic, [name].   She tells me this evening that she is feeling a little better and drinking some clear liquids, although still having regular episodes of emesis throughout the day. I witnessed one while talking with her: small volume, brownish, non bloody emesis.  Gastric emptying study was cancelled because patient drank something prior to study. Patient was told they would be able to do it on Monday.  - getting the gastric emptying study in the hospital would be ideal, as outpatient follow up is sometimes challenging.   Patient reports vaginal bleeding that started Wednesday, 2 weeks after her regular period. She states that this happens when she gets certain medicines, like metronidazole. Otherwise, she reports regular periods. Currently, she reports needing to change pads on a regular basis throughout the day.  - I reviewed her CBC and she is not anemic.  - I reviewed her medications and do not see any meds that would cause metrorrhagia. If anything, reglan would cause amenorrhea.   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   Again, thank you for taking care of this complex patient.   Marena Chancy, PGY-3 Family Medicine Resident

## 2012-08-23 NOTE — Progress Notes (Signed)
Family Medicine Teaching Service Daily Progress Note Intern Pager: [phone]  Patient name: [name] Medical record number: [id number] Date of birth: 1980/11/05 Age: 32 y.o. Gender: female  Primary Care Provider: Marena Chancy, MD Consultants: None  Code Status: Full  Pt Overview and Major Events to Date:   7/22: Admitted for intractable vomiting with UTI. Rocephin started 7/24: Clear liquid diet is not tolerated  Assessment and Plan:  [name] is a 32 y.o. year old female presenting with UTI and intractable vomiting, failing outpatient treatment. PMH is significant for HTN and Type I DM.   UTI - Urine Cx 7/21 >100k E.coli. Started on outpatient antibiotics which she was unable to tolerate due to vomiting. WBC 8.2 Rocephin started 7/22 - Continue fluids and antiemetics  - Dilaudid prn pain  - Afebrile, no leukocytosis at this time no CVA tenderness [ ]  Monitor for improvement.   Nausea/Vomiting - Has history of gastroparesis, and required hospitalization in Feb of this year for same. ? Cyclic vomiting syndrome. N/V could also be due to UTI or hyperglycemia.  - Continue IVF at 125 cc/hr (with dextrose only while NPO)  - Zofran prn for nausea, can alternate with phenergan if needed  - (7/25) Change reglan to IV - advanced to clear liquid and will advance diet as tolerated; will d/c d5 if patient eats PO.  - Patient reports "red" emesis at time but HgB stable. Asked to save emesis if she sees this again  [ ]  Emptying study as outpatient  T1DM Hb A1c: 10.3. On Lantus 30units and mealtime coverage at home. Last CBG: 220, 13 units aspart last 24 hours - D5 while NPO -> high sugars - Increased Lantus to 25 units - Sensitive SSI   HTN  - No home meds - Give Hydralazine 5mg  IV q4 prn for SBP >180 or DBP >100.  - start lisinopril 5mg  in diabetic pt  FEN/GI: NPO until tolerating PO. D51/2NS + 10 KCl at 125 cc/hr (D5 due to type 1 DM; d/c dextrose when taking po)   Prophylaxis: Heparin SQ. Protonix  Disposition: To home pending clinical improvement  Subjective: Pt reports one episode of emesis that was clear. Will try to take food PO today. No fever, dysuria. Continues to feel like she is improving.  Objective: Temp:  [98.8 F (37.1 C)-99.2 F (37.3 C)] 99.2 F (37.3 C) (07/25 0430) Pulse Rate:  [96-129] 112 (07/25 0430) Resp:  [19-20] 19 (07/25 0430) BP: (140-169)/(83-104) 148/83 mmHg (07/25 0430) SpO2:  [98 %-100 %] 100 % (07/25 0430) Weight:  [136 lb 3.9 oz (61.8 kg)] 136 lb 3.9 oz (61.8 kg) (07/25 0500)  Physical Exam: General: 32 yo female sitting up in bed appears in mild distress Cardiovascular: RRR faint SEM  Respiratory: Nonlabored, CTAB Abdomen: +BS, Soft, mild TTP, no guarding or rebound. No CVA tenderness Extremities: Moves all extremities spontaneously.   Laboratory:  Recent Labs Lab 08/21/12 0520 08/21/12 0945 08/22/12 0545 08/23/12 0440  WBC 17.1*  --  8.5 8.2  HGB 9.2* 14.2 12.7 13.9  HCT 27.6* 42.1 39.9 42.7  PLT 307  --  238 211    Recent Labs Lab 08/21/12 0520 08/22/12 0545 08/23/12 0440  NA 146* 142 140  K 4.6 3.7 3.1*  CL 105 103 101  CO2 25 29 28   BUN 17 9 8   CREATININE 0.70 0.68 0.77  CALCIUM 9.7 9.2 9.7  PROT 7.8 7.5 7.9  BILITOT 0.6 0.7 0.8  ALKPHOS 76 68 67  ALT 16 14 12   AST 19 16 16   GLUCOSE 275* 277* 163*   Hb A1c: 10.3 Beta-hCG: Neg  Hazeline Junker, MD 08/23/2012, 8:48 AM PGY-1, [facility] Family Medicine FPTS Intern pager: [phone], text pages welcome

## 2012-08-23 NOTE — Progress Notes (Signed)
Seen and examined.  Discussed with Dr. Jarvis Newcomer.  Agree with his management and documentation.  We will try to confirm the suspected diagnosis of diabetic gastroparesis with gastric emptying study.

## 2012-08-24 LAB — GLUCOSE, CAPILLARY
Glucose-Capillary: 236 mg/dL — ABNORMAL HIGH (ref 70–99)
Glucose-Capillary: 235 mg/dL — ABNORMAL HIGH (ref 70–99)

## 2012-08-24 LAB — COMPREHENSIVE METABOLIC PANEL
ALT: 10 U/L (ref 0–35)
AST: 14 U/L (ref 0–37)
Calcium: 8.9 mg/dL (ref 8.4–10.5)
Creatinine, Ser: 0.79 mg/dL (ref 0.50–1.10)
GFR calc Af Amer: >90 mL/min (ref >90)
Glucose, Bld: 99 mg/dL (ref 70–99)
Sodium: 142 mEq/L (ref 135–145)
Total Protein: 6.3 g/dL (ref 6.0–8.3)

## 2012-08-24 LAB — CBC
MCH: 30 pg (ref 26.0–34.0)
MCHC: 33.8 g/dL (ref 30.0–36.0)
Platelets: 223 10*3/uL (ref 150–400)

## 2012-08-24 MED ORDER — GUAIFENESIN ER 600 MG PO TB12
600.0000 mg | ORAL_TABLET | Freq: Once | ORAL | Status: AC
  Administered 2012-08-24: 600 mg via ORAL

## 2012-08-24 MED ORDER — POTASSIUM CHLORIDE CRYS ER 20 MEQ PO TBCR
40.0000 meq | EXTENDED_RELEASE_TABLET | Freq: Three times a day (TID) | ORAL | Status: DC
  Filled 2012-08-24: qty 2

## 2012-08-24 MED ORDER — MAGNESIUM SULFATE 40 MG/ML IJ SOLN
2.0000 g | Freq: Once | INTRAMUSCULAR | Status: AC
  Administered 2012-08-24: 2 g via INTRAVENOUS
  Filled 2012-08-24 (×2): qty 50

## 2012-08-24 MED ORDER — POTASSIUM CHLORIDE CRYS ER 20 MEQ PO TBCR: 40.0000 meq | EXTENDED_RELEASE_TABLET | Freq: Two times a day (BID) | ORAL | Status: DC

## 2012-08-24 MED ORDER — POTASSIUM CHLORIDE 10 MEQ/100ML IV SOLN
10.0000 meq | INTRAVENOUS | Status: AC
  Administered 2012-08-24: 10 meq via INTRAVENOUS
  Filled 2012-08-24: qty 100

## 2012-08-24 MED ORDER — POTASSIUM CHLORIDE CRYS ER 20 MEQ PO TBCR
40.0000 meq | EXTENDED_RELEASE_TABLET | Freq: Two times a day (BID) | ORAL | Status: DC
  Administered 2012-08-24: 40 meq via ORAL
  Filled 2012-08-24 (×2): qty 2

## 2012-08-24 MED ORDER — SODIUM CHLORIDE 0.9 % IV SOLN
INTRAVENOUS | Status: DC
  Administered 2012-08-24 (×2): via INTRAVENOUS

## 2012-08-24 MED ORDER — GUAIFENESIN ER 600 MG PO TB12
600.0000 mg | ORAL_TABLET | Freq: Two times a day (BID) | ORAL | Status: DC
  Administered 2012-08-24 – 2012-08-25 (×2): 600 mg via ORAL
  Filled 2012-08-24 (×5): qty 1

## 2012-08-24 NOTE — Progress Notes (Signed)
Seen and examined.  Discussed with Dr. Paulina Fusi.  Agree with his management and documentation.

## 2012-08-24 NOTE — Treatment Plan (Signed)
Lab notified this RN that pt's K was 2.7. Dr. Napoleon Form notified at 6:50.  No verbal orders given.

## 2012-08-24 NOTE — Progress Notes (Signed)
Family Medicine Teaching Service Daily Progress Note Intern Pager: [phone]  Patient name: [name] Medical record number: [id number] Date of birth: 08-25-1980 Age: 32 y.o. Gender: female  Primary Care Provider: Marena Chancy, MD Consultants: None  Code Status: Full  Pt Overview and Major Events to Date:   7/22: Admitted for intractable vomiting with UTI. Rocephin started 7/24: Clear liquid diet is not tolerated  Assessment and Plan:  [name] is a 32 y.o. year old female presenting with UTI and intractable vomiting, failing outpatient treatment. PMH is significant for HTN and Type I DM.   UTI - Urine Cx 7/21 >100k E.coli. Started on outpatient antibiotics which she was unable to tolerate due to vomiting. WBC 8.2 Rocephin started 7/22-completed 7/25 - Continue fluids and antiemetics  - Dilaudid prn pain  - Afebrile, no leukocytosis  [ ]  Monitor for improvement.   Nausea/Vomiting - Has history of gastroparesis, and required hospitalization in Feb of this year for same. ? Cyclic vomiting syndrome. N/V could also be due to UTI or hyperglycemia.  - Continue IVF at 125 cc/hr (with dextrose only while NPO)  - Zofran prn for nausea, can alternate with phenergan if needed  - (7/25) Change reglan to IV - advanced to clear liquid and will advance diet as tolerated; will d/c d5 if patient eats PO.  - Patient reports "red" emesis at time but HgB stable. Asked to save emesis if she sees this again  [ ]  Gastric emptying study to be done Monday   T1DM Hb A1c: 10.3. On Lantus 30units and mealtime coverage at home. Last CBG: 115, 13 units aspart last 24 hours - D5 while NPO -> high sugars - Increased Lantus to 25 units - Sensitive SSI   HTN  - No home meds - Give Hydralazine 5mg  IV q4 prn for SBP >180 or DBP >100.  - Lisinopril 10 mg qd for renal protection in DM  #Hypokalemia  -K 2.7 on today's BMET - Replete IV x 6 Kcl runs  [ ]  check Mg  FEN/GI: NPO until  tolerating PO. D51/2NS + 10 KCl at 125 cc/hr (D5 due to type 1 DM; d/c dextrose when taking po)  Prophylaxis: Heparin SQ. Protonix  Disposition: To home pending clinical improvement  Subjective: Pt continues to feel nauseated with few episodes of vomitus.  Wants to try advancing diet slowly  Objective: Temp:  [98.9 F (37.2 C)-99.2 F (37.3 C)] 99.2 F (37.3 C) (07/26 0528) Pulse Rate:  [103-113] 103 (07/26 0528) Resp:  [18] 18 (07/26 0528) BP: (126-154)/(84-98) 126/84 mmHg (07/26 0528) SpO2:  [98 %-100 %] 100 % (07/26 0528)  Physical Exam: General: 32 yo female sitting up in bed appears in mild distress Cardiovascular: RRR faint SEM  Respiratory: Nonlabored, CTAB Abdomen: +BS, Soft, mild TTP, no guarding or rebound. No CVA tenderness Extremities: Moves all extremities spontaneously.   Laboratory:  Recent Labs Lab 08/22/12 0545 08/23/12 0440 08/24/12 0507  WBC 8.5 8.2 7.2  HGB 12.7 13.9 12.5  HCT 39.9 42.7 37.0  PLT 238 211 223    Recent Labs Lab 08/22/12 0545 08/23/12 0440 08/24/12 0507  NA 142 140 142  K 3.7 3.1* 2.7*  CL 103 101 103  CO2 29 28 31   BUN 9 8 8   CREATININE 0.68 0.77 0.79  CALCIUM 9.2 9.7 8.9  PROT 7.5 7.9 6.3  BILITOT 0.7 0.8 0.6  ALKPHOS 68 67 49  ALT 14 12 10   AST 16 16 14   GLUCOSE 277*  163* 99   Hb A1c: 10.3 Beta-hCG: Neg  Briscoe Deutscher, DO 08/24/2012, 9:28 AM PGY-2, [facility] FPTS Intern pager: [phone], text pages welcome

## 2012-08-25 DIAGNOSIS — D649 Anemia, unspecified: Secondary | ICD-10-CM

## 2012-08-25 LAB — GLUCOSE, CAPILLARY
Glucose-Capillary: 174 mg/dL — ABNORMAL HIGH (ref 70–99)
Glucose-Capillary: 93 mg/dL (ref 70–99)

## 2012-08-25 LAB — COMPREHENSIVE METABOLIC PANEL
ALT: 11 U/L (ref 0–35)
AST: 16 U/L (ref 0–37)
Albumin: 3.1 g/dL — ABNORMAL LOW (ref 3.5–5.2)
Alkaline Phosphatase: 44 U/L (ref 39–117)
CO2: 28 mEq/L (ref 19–32)
Chloride: 103 mEq/L (ref 96–112)
Creatinine, Ser: 0.78 mg/dL (ref 0.50–1.10)
GFR calc non Af Amer: >90 mL/min (ref >90)
Potassium: 3.1 mEq/L — ABNORMAL LOW (ref 3.5–5.1)
Sodium: 139 mEq/L (ref 135–145)
Total Bilirubin: 0.4 mg/dL (ref 0.3–1.2)

## 2012-08-25 LAB — CBC
MCV: 89.2 fL (ref 78.0–100.0)
Platelets: 196 10*3/uL (ref 150–400)
RBC: 3.6 MIL/uL — ABNORMAL LOW (ref 3.87–5.11)
RDW: 14.3 % (ref 11.5–15.5)
WBC: 9.5 10*3/uL (ref 4.0–10.5)

## 2012-08-25 MED ORDER — DEXTROSE-NACL 5-0.45 % IV SOLN: INTRAVENOUS | Status: DC

## 2012-08-25 MED ORDER — PANTOPRAZOLE SODIUM 40 MG PO TBEC: 40.0000 mg | DELAYED_RELEASE_TABLET | Freq: Every day | ORAL | Status: DC

## 2012-08-25 MED ORDER — POTASSIUM CHLORIDE CRYS ER 20 MEQ PO TBCR
40.0000 meq | EXTENDED_RELEASE_TABLET | Freq: Two times a day (BID) | ORAL | Status: DC
  Administered 2012-08-25: 40 meq via ORAL
  Filled 2012-08-25: qty 2

## 2012-08-25 MED ORDER — METOCLOPRAMIDE HCL 10 MG PO TABS: 10.0000 mg | ORAL_TABLET | Freq: Three times a day (TID) | ORAL | Status: DC

## 2012-08-25 NOTE — Progress Notes (Signed)
Seen and examined.  Feeling much better.  OK to DC with gastric emptying study as an outpatient.

## 2012-08-25 NOTE — Progress Notes (Signed)
Family Medicine Teaching Service Daily Progress Note Intern Pager: [phone]  Patient name: [name] Medical record number: [id number] Date of birth: 04-17-1980 Age: 32 y.o. Gender: female  Primary Care Provider: Marena Chancy, MD Consultants: None  Code Status: Full  Pt Overview and Major Events to Date:   7/22: Admitted for intractable vomiting with UTI. Rocephin started 7/24: Clear liquid diet is not tolerated 7/26: diet advanced, tolerated well   Assessment and Plan:  [name] is a 32 y.o. year old female presenting with UTI and intractable vomiting, failing outpatient treatment. PMH is significant for HTN and Type I DM.   UTI - Urine Cx 7/21 >100k E.coli. Started on outpatient antibiotics which she was unable to tolerate due to vomiting. WBC 8.2 Rocephin started 7/22-completed 7/25 - Continue fluids and antiemetics  - Dilaudid prn pain  - Afebrile, no leukocytosis    Nausea/Vomiting - Has history of gastroparesis, and required hospitalization in Feb of this year for same. ? Cyclic vomiting syndrome. N/V could also be due to UTI or hyperglycemia.  - Zofran prn for nausea, can alternate with phenergan if needed  - Continue to monitor how pt does on advancing diet   - Patient reports "red" emesis at time but HgB stable. Asked to save emesis if she sees this again  [ ]  Gastric emptying study to be done Monday. NPO at midnight w/ D5 1/2 NS @ 100 cc/hr  T1DM Hb A1c: 10.3. On Lantus 30units and mealtime coverage at home. Last CBG: 104, 4units aspart last 24 hours - Lantus to 25 units - Sensitive SSI   HTN  - No home meds - Give Hydralazine 5mg  IV q4 prn for SBP >180 or DBP >100.  - Lisinopril 10 mg qd for renal protection in DM  #Hypokalemia  -Mg repleted yesterday. - K+ today after 2 IV runs and 2, 40 meq PO Kdur is 3.1. - Will replete x 2 today as well  FEN/GI: Advance diet as tolerated. NPO at midnight for Gastric emptying study  Prophylaxis: Heparin  SQ. Protonix  Disposition: To home pending clinical improvement  Subjective: Pt feeling well this AM, currently eating breakfast w/o nausea.   Objective: Temp:  [98.1 F (36.7 C)-98.8 F (37.1 C)] 98.3 F (36.8 C) (07/27 0628) Pulse Rate:  [96-99] 99 (07/27 0628) Resp:  [18-20] 18 (07/27 0628) BP: (115-124)/(67-81) 115/71 mmHg (07/27 0628) SpO2:  [99 %-100 %] 99 % (07/27 5284)  Physical Exam: General: 32 yo female NAD Cardiovascular: RRR  Respiratory: Nonlabored, CTAB Abdomen: +BS, Soft, no TTP, no guarding or rebound. No CVA tenderness Extremities: Moves all extremities spontaneously.   Laboratory:  Recent Labs Lab 08/22/12 0545 08/23/12 0440 08/24/12 0507  WBC 8.5 8.2 7.2  HGB 12.7 13.9 12.5  HCT 39.9 42.7 37.0  PLT 238 211 223    Recent Labs Lab 08/22/12 0545 08/23/12 0440 08/24/12 0507  NA 142 140 142  K 3.7 3.1* 2.7*  CL 103 101 103  CO2 29 28 31   BUN 9 8 8   CREATININE 0.68 0.77 0.79  CALCIUM 9.2 9.7 8.9  PROT 7.5 7.9 6.3  BILITOT 0.7 0.8 0.6  ALKPHOS 68 67 49  ALT 14 12 10   AST 16 16 14   GLUCOSE 277* 163* 99   Hb A1c: 10.3 Beta-hCG: Neg  Briscoe Deutscher, DO 08/25/2012, 6:42 AM PGY-2, [facility] FPTS Intern pager: [phone], text pages welcome

## 2012-08-26 NOTE — Discharge Summary (Signed)
Family Medicine Teaching [facility] Discharge Summary  Patient name: [name] Medical record number: [id number] Date of birth: 1980/04/17 Age: 32 y.o. 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 year old female presenting with UTI and intractable vomiting, failing outpatient treatment. PMH is significant for HTN and Type I DM.  Problem-oriented summary:  E. coli UTI:  Pt was started on outpatient antibiotics which she was unable to tolerate due to vomiting, so was begun in rocephin IV on 7/22 continued through 7/25. She remained afebrile with no leukocytosis.   T1DM: Pt had Hb A1c of 10.3 on Lantus 30units and mealtime coverage at home.  She remained on IVF with D5 while NPO due to intractable vomiting, and SSI was used inpatient with titration of lantus up to 25 units.   Nausea/Vomiting  Required hospitalization in Feb of this year with gastroparesis as primary Dx, though no occurrence of gastroscintigraphy was found.  The nuclear medicine study was considered during this admission given history of undependable follow-up, but it was improved on reglan and she was discharged in improved condition with directions to call her PCP for appointment, at which time a gastric emptying study could be scheduled. Cyclic vomiting syndrome was also considered, though this would be a diagnosis after excluding gastroparesis.  Pt was discharged with Rx for reglan and protonix.   HTN  No home medications were  reported by patient, so prn hydralazine was ordered and low-dose ACE was started in setting of DM, though continued management with this is left to PCP.    Issues for Follow Up:  T1DM with Hb A1c: 10.3, worsened from 9.8 in Nov 2013.  Assessing appropriateness of ACE inhibitor.   Obtaining gastric emptying study as outpatient.   Significant Procedures: None  Significant Labs and Imaging:  UPreg: NEG Occult blood, gastric: POS  08/19/12 Urine Culture  ESCHERICHIA COLI  AMPICILLIN 4 SENSITIVE S  CEFAZOLIN <=4 SENSITIVE S CEFTRIAXONE <=1 SENSITIVE S CIPROFLOXACIN <=0.25 SENSITIVE S  GENTAMICIN <=1 SENSITIVE S  LEVOFLOXACIN <=0.12 SENSITIVE S  NITROFURANTOIN <=16 SENSITIVE S PIP/TAZO <=4 SENSITIVE S  TOBRAMYCIN <=1 SENSITIVE S  TRIMETH/SULFA <=20 SENSITIVE S    Recent Labs Lab 08/23/12 0440 08/24/12 0507 08/25/12 0615  WBC 8.2 7.2 9.5  HGB 13.9 12.5 10.6*  HCT 42.7 37.0 32.1*  PLT 211 223 196    Recent Labs Lab 08/21/12 0520 08/22/12 0545 08/23/12 0440 08/24/12 0507 08/25/12 0615  NA 146* 142 140 142 139  K 4.6 3.7 3.1* 2.7* 3.1*  CL 105 103 101 103 103  CO2 25 29 28 31 28   GLUCOSE 275* 277* 163* 99 96  BUN 17 9 8 8 6   CREATININE 0.70 0.68 0.77 0.79 0.78  CALCIUM 9.7 9.2 9.7 8.9 8.7  MG  --   --   --  1.8  --   ALKPHOS 76 68 67 49 44  AST 19 16 16 14 16   ALT 16 14 12 10 11   ALBUMIN 3.8 3.7 3.8 3.1* 3.1*   Outstanding Results: None  Discharge Medications:    Medication List  ciprofloxacin 500 MG tablet  Commonly known as:  CIPRO  Take 500 mg by mouth every 12 (twelve) hours. Take x10 days. Began regimen on 08/19/12.     [employer] Lancets Original Misc  1 Stick by Does not apply route 4 (four) times daily -  before meals and at bedtime.     glucose blood test strip  Commonly known as:  ACCU-CHEK AVIVA  Use as instructed     insulin aspart 100 UNIT/ML injection  Commonly known as:  novoLOG  Inject 5-6 Units into the skin 3 (three) times daily as  needed. For CBG over 180     insulin glargine 100 UNIT/ML injection  Commonly known as:  LANTUS  Inject 30 Units into the skin at bedtime.     metoCLOPramide 10 MG tablet  Commonly known as:  REGLAN  Take 1 tablet (10 mg total) by mouth 4 (four) times daily -  before meals and at bedtime.     multivitamin with minerals Tabs  Take 1 tablet by mouth every other day.     pantoprazole 40 MG tablet  Commonly known as:  PROTONIX  Take 1 tablet (40 mg total) by mouth daily.     promethazine 25 MG tablet  Commonly known as:  PHENERGAN  Take 1 tablet (25 mg total) by mouth every 6 (six) hours as needed for nausea.        Discharge Instructions: Please refer to Patient Instructions section of EMR for full details.  Patient was counseled important signs and symptoms that should prompt return to medical care, changes in medications, dietary instructions, activity restrictions, and follow up appointments.   Follow-Up Appointments:     Follow-up Information   Schedule an appointment as soon as possible for a visit with Rachel Chancy, MD. (Please make and keep appointment for hospital follow up)    Contact information:   [address] [place] [address] [phone]       Hazeline Junker, MD 08/26/2012, 9:54 PM PGY-1, [facility]

## 2012-08-26 NOTE — ED Provider Notes (Signed)
An addendum to her previous ED note. The entry about gastroenteritis should have read early pyelonephritis with no DKA rather than gastroenteritis due to alcohol intake. This was a dragon issue.    Carlyle Dolly, PA-C 08/26/12 [phone]

## 2012-08-27 NOTE — ED Provider Notes (Signed)
Medical screening examination/treatment/procedure(s) were performed by non-physician practitioner and as supervising physician I was immediately available for consultation/collaboration.   Galit Urich, MD 08/27/12 1543 

## 2012-08-28 ENCOUNTER — Encounter: Payer: Self-pay | Admitting: Family Medicine

## 2012-08-28 ENCOUNTER — Ambulatory Visit (INDEPENDENT_AMBULATORY_CARE_PROVIDER_SITE_OTHER): Payer: Medicaid Other | Admitting: Family Medicine

## 2012-08-28 VITALS — BP 130/80 | HR 92 | Temp 97.9°F | Ht 67.0 in | Wt 151.0 lb

## 2012-08-28 DIAGNOSIS — IMO0002 Reserved for concepts with insufficient information to code with codable children: Secondary | ICD-10-CM

## 2012-08-28 DIAGNOSIS — I10 Essential (primary) hypertension: Secondary | ICD-10-CM

## 2012-08-28 DIAGNOSIS — E876 Hypokalemia: Secondary | ICD-10-CM

## 2012-08-28 DIAGNOSIS — K3184 Gastroparesis: Secondary | ICD-10-CM

## 2012-08-28 DIAGNOSIS — E1065 Type 1 diabetes mellitus with hyperglycemia: Secondary | ICD-10-CM

## 2012-08-28 DIAGNOSIS — N390 Urinary tract infection, site not specified: Secondary | ICD-10-CM

## 2012-08-28 DIAGNOSIS — K5900 Constipation, unspecified: Secondary | ICD-10-CM

## 2012-08-28 DIAGNOSIS — D649 Anemia, unspecified: Secondary | ICD-10-CM

## 2012-08-28 MED ORDER — LISINOPRIL 10 MG PO TABS: 10.0000 mg | ORAL_TABLET | Freq: Every day | ORAL | Status: DC

## 2012-08-28 MED ORDER — POLYETHYLENE GLYCOL 3350 17 GM/SCOOP PO POWD: 17.0000 g | Freq: Two times a day (BID) | ORAL | Status: DC | PRN

## 2012-08-28 MED ORDER — BISACODYL 10 MG RE SUPP: 10.0000 mg | RECTAL | Status: DC | PRN

## 2012-08-28 NOTE — Progress Notes (Signed)
Patient ID: LENYX BOODY    DOB: [birth year], 32 y.o.   MRN: [id number] --- Subjective:  Shavawn is a 32 y.o.female with h/o DM1, recurrent UTI who presents for hospital follow up.  She was hospitalized on 08/20/12 for intractable nausea and vomiting.   - nausea and vomiting: admitted for this previously. improved, some nausea still but is able to eat and drink. She has been taking reglan 10mg  4 times a day which has helped. No abdominal pain.  - constipation: has not had bowel movement x1week. She feels sensation of bloating and fullness. The constipation makes her feel nauseous at times. She has not tried anything for it.  - UTI: h/o 3 UTI's in the last year associated one time with renal abscess. She was prescribed cipro before being hospitalized and she has resumed taking it since discharge from hospital. She denies any dysuria, hematuria or increaed frequency in urination.  - DM1: lantus 30 units and novolog 5 units with meals. This am before eaing: 155, yesterday 488 for which she took 5 units of novolog. No CBG's lower than 70.  - irregular cycles: has had multiple occasions of extra period within month. This has recently happened. Not on birth control meds. Would like to try birth control pills in the past to see if it could better control it.    ROS: see HPI Past Medical History: reviewed and updated medications and allergies. Social History: Tobacco: 1/3 pack per day  Objective: Filed Vitals:   08/28/12 1435  BP: 130/80  Pulse: 92  Temp: 97.9 F (36.6 C)    Physical Examination:   General appearance - alert, well appearing, and in no distress Chest - clear to auscultation, no wheezes, rales or rhonchi, symmetric air entry Heart - normal rate, regular rhythm, normal S1, S2, no murmurs, rubs, clicks or gallops Abdomen - soft, nontender, nondistended, fullness that could represent stool burden, no organomegally Extremities - no pedal edema

## 2012-08-28 NOTE — Assessment & Plan Note (Signed)
Repeat BMP today. 

## 2012-08-28 NOTE — Assessment & Plan Note (Signed)
Significant constipation with some fullness on exam. Miralax up to twice a day with dulcolax suppository. Return to clinic if not better

## 2012-08-28 NOTE — Assessment & Plan Note (Signed)
Not well controled, but given other more acute issues at this time, will continue lantus 30 and novolog 5units. If continues to be elevated, consider increasing lantus dose.

## 2012-08-28 NOTE — Assessment & Plan Note (Signed)
Intractable nausea and vomiting significantly improved. Thought to be from gastroparesis from uncontrolled DM1. Better on reglan. Will obtain gastric emptying study to prove the diagnosis.

## 2012-08-28 NOTE — Discharge Summary (Signed)
Seen and examined on the day of DC.  Agree with DC management and documentation as outlined by Dr. Grunz.   

## 2012-08-28 NOTE — Assessment & Plan Note (Signed)
Continue cipro. In setting of recurrent UTI's, will strongly consider starting patient on prophylactic dose of antibiotics.

## 2012-08-28 NOTE — Assessment & Plan Note (Signed)
CBC today with iron studies

## 2012-08-28 NOTE — Assessment & Plan Note (Signed)
Start her back on lisinopril 10mg  for renal protection. BMP in 3 weeks.

## 2012-08-28 NOTE — Patient Instructions (Addendum)
For the constipation, start miralax once to twice a day. Also use the suppository.  Start taking the lisinopril for blood pressure and kidney protection.  Continue the antibiotics.  Follow up in 3 weeks.

## 2012-08-29 ENCOUNTER — Telehealth: Payer: Self-pay | Admitting: *Deleted

## 2012-08-29 LAB — CBC
MCH: 29.1 pg (ref 26.0–34.0)
MCHC: 30.9 g/dL (ref 30.0–36.0)
MCV: 94.3 fL (ref 78.0–100.0)
Platelets: 311 10*3/uL (ref 150–400)
RDW: 14.9 % (ref 11.5–15.5)
WBC: 5.7 10*3/uL (ref 4.0–10.5)

## 2012-08-29 LAB — BASIC METABOLIC PANEL
BUN: 6 mg/dL (ref 6–23)
Calcium: 9.3 mg/dL (ref 8.4–10.5)
Creat: 0.87 mg/dL (ref 0.50–1.10)

## 2012-08-29 LAB — IRON AND TIBC
Iron: 49 ug/dL (ref 42–145)
TIBC: 269 ug/dL (ref 250–470)
UIBC: 220 ug/dL (ref 125–400)

## 2012-08-29 NOTE — Telephone Encounter (Signed)
Called pt and informed of appt and instructions. Lorenda Hatchet, Renato Battles

## 2012-08-29 NOTE — Telephone Encounter (Signed)
Left message to return call. Please tell [name] she has an appointment at [facility] for the gastric emptying study on 09/04/12 at 6:45 am. NPO after midnight and she is NOT to take any antiacids after midnight. Make sure she dose NOT take her PROTONIX .Busick, Rodena Medin

## 2012-08-30 ENCOUNTER — Ambulatory Visit (INDEPENDENT_AMBULATORY_CARE_PROVIDER_SITE_OTHER): Payer: Medicaid Other | Admitting: *Deleted

## 2012-08-30 DIAGNOSIS — Z111 Encounter for screening for respiratory tuberculosis: Secondary | ICD-10-CM

## 2012-08-30 NOTE — Progress Notes (Signed)
PPD Placement note [name], 32 y.o. female is here today for placement of PPD test Reason for PPD test: work Pt taken PPD test before: yes Verified in allergy area and with patient that they are not allergic to the products PPD is made of (Phenol or Tween). Yes Is patient taking any oral or IV steroid medication now or have they taken it in the last month? no Has the patient ever received the BCG vaccine?: no Has the patient been in recent contact with anyone known or suspected of having active TB disease?: no   * O: Alert and oriented in NAD. P:  PPD placed on 08/30/2012.  Patient advised to return for reading within 48-72 hours. Wyatt Haste, RN-BSN

## 2012-09-02 ENCOUNTER — Encounter: Payer: Self-pay | Admitting: *Deleted

## 2012-09-02 ENCOUNTER — Ambulatory Visit: Payer: Medicaid Other | Admitting: *Deleted

## 2012-09-02 DIAGNOSIS — Z111 Encounter for screening for respiratory tuberculosis: Secondary | ICD-10-CM

## 2012-09-02 NOTE — Progress Notes (Signed)
PPD Reading Note PPD read and results entered in EpicCare. Result:  0 mm induration. Interpretation:negative If test not read within 48-72 hours of initial placement, patient advised to repeat in other arm 1-3 weeks after this test. Allergic reaction: no  Letter given regarding results Elizabeth Madelyne Millikan, RN-BSN  

## 2012-09-03 ENCOUNTER — Encounter: Payer: Self-pay | Admitting: Family Medicine

## 2012-09-04 ENCOUNTER — Encounter (HOSPITAL_COMMUNITY)
Admission: RE | Admit: 2012-09-04 | Discharge: 2012-09-04 | Disposition: A | Discharge status: Home / Self Care | Payer: Medicaid Other | Source: Ambulatory Visit | Attending: Family Medicine | Admitting: Family Medicine

## 2012-09-04 DIAGNOSIS — K3184 Gastroparesis: Secondary | ICD-10-CM

## 2012-09-04 DIAGNOSIS — R1013 Epigastric pain: Secondary | ICD-10-CM | POA: Insufficient documentation

## 2012-09-04 DIAGNOSIS — R109 Unspecified abdominal pain: Secondary | ICD-10-CM | POA: Insufficient documentation

## 2012-09-04 DIAGNOSIS — R142 Eructation: Secondary | ICD-10-CM | POA: Insufficient documentation

## 2012-09-04 DIAGNOSIS — K3189 Other diseases of stomach and duodenum: Secondary | ICD-10-CM | POA: Insufficient documentation

## 2012-09-04 DIAGNOSIS — R141 Gas pain: Secondary | ICD-10-CM | POA: Insufficient documentation

## 2012-09-04 MED ORDER — TECHNETIUM TC 99M SULFUR COLLOID
2.0000 | Freq: Once | INTRAVENOUS | Status: AC | PRN
  Administered 2012-09-04: 2 via ORAL

## 2012-09-06 ENCOUNTER — Telehealth: Payer: Self-pay | Admitting: Family Medicine

## 2012-09-06 DIAGNOSIS — R112 Nausea with vomiting, unspecified: Secondary | ICD-10-CM

## 2012-09-06 MED ORDER — FLUCONAZOLE 150 MG PO TABS: 150.0000 mg | ORAL_TABLET | Freq: Once | ORAL | Status: DC

## 2012-09-06 NOTE — Telephone Encounter (Signed)
Called patient to let her know that gastric emptying study showed significant gastric retention and possible element of gastric outlet obstruction. Told her that I recommend that she be seen by GI for an EGD. She expressed understanding and agreed to plan.  Additionally, she reports that being on the antibiotics has given her a yeast infection. Will treat with fluconazole 150mg  every 3rd day for 3 doses.   Marena Chancy, PGY-3 Family Medicine Resident

## 2012-09-17 ENCOUNTER — Ambulatory Visit: Payer: Medicaid Other | Admitting: Family Medicine

## 2012-09-24 ENCOUNTER — Ambulatory Visit (INDEPENDENT_AMBULATORY_CARE_PROVIDER_SITE_OTHER): Payer: Medicaid Other | Admitting: Family Medicine

## 2012-09-24 ENCOUNTER — Other Ambulatory Visit (HOSPITAL_COMMUNITY)
Admission: RE | Admit: 2012-09-24 | Discharge: 2012-09-24 | Disposition: A | Discharge status: Home / Self Care | Payer: Medicaid Other | Source: Ambulatory Visit | Attending: Family Medicine | Admitting: Family Medicine

## 2012-09-24 ENCOUNTER — Encounter: Payer: Self-pay | Admitting: Family Medicine

## 2012-09-24 VITALS — BP 118/80 | HR 85 | Temp 98.2°F | Ht 67.0 in | Wt 149.2 lb

## 2012-09-24 DIAGNOSIS — R21 Rash and other nonspecific skin eruption: Secondary | ICD-10-CM

## 2012-09-24 DIAGNOSIS — N390 Urinary tract infection, site not specified: Secondary | ICD-10-CM

## 2012-09-24 DIAGNOSIS — I10 Essential (primary) hypertension: Secondary | ICD-10-CM

## 2012-09-24 DIAGNOSIS — IMO0002 Reserved for concepts with insufficient information to code with codable children: Secondary | ICD-10-CM

## 2012-09-24 DIAGNOSIS — Z113 Encounter for screening for infections with a predominantly sexual mode of transmission: Secondary | ICD-10-CM | POA: Insufficient documentation

## 2012-09-24 DIAGNOSIS — N760 Acute vaginitis: Secondary | ICD-10-CM

## 2012-09-24 DIAGNOSIS — N912 Amenorrhea, unspecified: Secondary | ICD-10-CM

## 2012-09-24 DIAGNOSIS — N898 Other specified noninflammatory disorders of vagina: Secondary | ICD-10-CM

## 2012-09-24 DIAGNOSIS — Z309 Encounter for contraceptive management, unspecified: Secondary | ICD-10-CM

## 2012-09-24 DIAGNOSIS — E1065 Type 1 diabetes mellitus with hyperglycemia: Secondary | ICD-10-CM

## 2012-09-24 LAB — POCT URINALYSIS DIPSTICK
Glucose, UA: 500
Ketones, UA: NEGATIVE
Leukocytes, UA: NEGATIVE
Spec Grav, UA: 1.015

## 2012-09-24 LAB — POCT WET PREP (WET MOUNT): Clue Cells Wet Prep Whiff POC: POSITIVE

## 2012-09-24 MED ORDER — MEDROXYPROGESTERONE ACETATE 150 MG/ML IM SUSP
150.0000 mg | Freq: Once | INTRAMUSCULAR | Status: AC
  Administered 2012-09-24: 150 mg via INTRAMUSCULAR

## 2012-09-24 NOTE — Progress Notes (Signed)
Next depo provera due Nov 11-25,2014.   Jakyiah Briones, Darlyne Russian, CMA

## 2012-09-24 NOTE — Patient Instructions (Addendum)
For the diabetes, check your sugars and follow up in 1 month with a log.  For the vaginal itching, I am sending a medicine for yeast infection.   For the itching of your skin, I am sending a strong steroid cream, if that doesn't work, I will refer you to dermatology.

## 2012-09-25 ENCOUNTER — Encounter: Payer: Self-pay | Admitting: Family Medicine

## 2012-09-25 MED ORDER — INSULIN GLARGINE 100 UNIT/ML ~~LOC~~ SOLN: 30.0000 [IU] | Freq: Every day | SUBCUTANEOUS | Status: DC

## 2012-09-25 MED ORDER — GLUCOSE BLOOD VI STRP: ORAL_STRIP | Status: DC

## 2012-09-25 MED ORDER — CLINDAMYCIN HCL 300 MG PO CAPS: 300.0000 mg | ORAL_CAPSULE | Freq: Three times a day (TID) | ORAL | Status: DC

## 2012-09-25 MED ORDER — CIPROFLOXACIN HCL 250 MG PO TABS: 125.0000 mg | ORAL_TABLET | Freq: Every day | ORAL | Status: DC

## 2012-09-25 MED ORDER — INSULIN ASPART 100 UNIT/ML ~~LOC~~ SOLN: 5.0000 [IU] | Freq: Three times a day (TID) | SUBCUTANEOUS | Status: DC | PRN

## 2012-09-25 MED ORDER — FERROUS SULFATE 325 (65 FE) MG PO TABS: 325.0000 mg | ORAL_TABLET | Freq: Every day | ORAL | Status: DC

## 2012-09-25 MED ORDER — CVS LANCETS ORIGINAL MISC: 1.0000 | Freq: Three times a day (TID) | Status: DC

## 2012-09-25 MED ORDER — TRIAMCINOLONE ACETONIDE 0.5 % EX OINT: TOPICAL_OINTMENT | Freq: Two times a day (BID) | CUTANEOUS | Status: DC

## 2012-09-25 MED ORDER — LISINOPRIL 10 MG PO TABS: 10.0000 mg | ORAL_TABLET | Freq: Every day | ORAL | Status: DC

## 2012-09-26 ENCOUNTER — Telehealth: Payer: Self-pay | Admitting: Family Medicine

## 2012-09-26 NOTE — Telephone Encounter (Signed)
Meds reviewed and covered with pharmacist . Wyatt Haste, RN-BSN

## 2012-09-26 NOTE — Telephone Encounter (Signed)
Maria from the pharmacy called and has some questions about the prescriptions that Dr. Gwenlyn Saran sent in for [facility]. Her number is [phone]. JW

## 2012-09-27 ENCOUNTER — Telehealth: Payer: Self-pay | Admitting: *Deleted

## 2012-09-27 NOTE — Telephone Encounter (Signed)
Discussed with Dr. Gwenlyn Saran.  Returned call to pharmacy.  Flagyl was d/c'd and Clindamycin was eRx'd at recent office visit.  Will increase quantity to #21--take 1 po TID x 7 days.  Patient currently gets Novolog flexpen.  Will continue with flexpen and verified to iInject 5-6 Units into the skin 3 (three) times daily as needed for CBG over 180.  Patient had requested longer needles at office visit.  Pharmacy will dispense 29g, 1/2" needles.  Gaylene Brooks, RN

## 2012-09-27 NOTE — Telephone Encounter (Signed)
Pharmacy calling to check on 2 meds.  Received eRx for Flagyl 500 mg #60--1 tab po BID x 6 refills ands wants to verify that this is correct.  Also, received eRx for Novolog--vial or flexpen?  Will also need directions for insulin.  Patient will also need Rx for pens or needles depending on whether she gets vials/flexpen.  Will route note to Dr. Gwenlyn Saran and call back today.  Gaylene Brooks, RN

## 2012-09-28 DIAGNOSIS — N912 Amenorrhea, unspecified: Secondary | ICD-10-CM | POA: Insufficient documentation

## 2012-09-28 NOTE — Assessment & Plan Note (Signed)
UPT negative. Unclear etiology. Given DM1 and multiple teratogenic meds (including lisinopril) strongly encouraged contraception. Patient opted for depo which she received on day of visit.

## 2012-09-28 NOTE — Assessment & Plan Note (Addendum)
Unclear etiology. Doesn't appear to be fungal or to be scabies. Try triamcinolone ointment. If not better, will refer to derm

## 2012-09-28 NOTE — Assessment & Plan Note (Addendum)
Started patient on continuous therapy given history of recurrent e-coli UTI. cipro daily sent to pharmacy

## 2012-09-28 NOTE — Assessment & Plan Note (Signed)
Patient more concerned about her rash and recurrent BV than about her diabetes at this time. Not checking CBG's. Will refill lantus 30 units and novolog. A1C of 10 on 08/20/12. Repeat A1C in October. Patient may very much benefit from endocrine referral.

## 2012-09-28 NOTE — Assessment & Plan Note (Signed)
Recurrent BV vaginitis. Originally thought from exam that it was a yeast vaginitis and told patient we would treat for that. Upon review of wet prep, it appears to be BV. Tried calling patient to discuss findings, but phone is disconnected. Sent to pharmacy a prescription for clindamycin in an attempt to try a different treatment than flagyl in setting of recurrent BV.  If this doesn't work, would consider either trying combination of flagyl and boric acid followed by continuous metrogel or referral to Gyn.

## 2012-09-28 NOTE — Assessment & Plan Note (Signed)
Refilled lisinopril 10mg daily

## 2012-09-28 NOTE — Progress Notes (Signed)
Patient ID: MAPLE ODANIEL    DOB: December 30, 1980, 32 y.o.   MRN: [id number] --- Subjective:  Jahniyah is a 32 y.o.female with h/o DM1, gastroparesis, recurrent UTI who presents with multiple concerns.  - vaginal discharge: itching, present for a few days. Odor present. No dysuria or polyuria. Has a history of recurrent BV. Finished the last course of metronidazole a couple of weeks ago.  Sexually active. Doesn't wear condoms. 1 partner. LMP: June 2014.   - itching of skin: has been present for several months. Located on back, under breasts, arms. Itches constantly. Associated with dark spots. She had been diagnosed with scabies and had used permethrin which only helped temporarily. She states she has changed beds twice. She is no longer living in the same apartment. No other contacts have this. She did not use hydrocortisone ointment as she did not think it would help. No fevers, no chills.   - Diabetes: has not been checking her CBG's because battery of glucometer is out and she doesn't have the money to replace it. She has been taking lantus 30 units daily and novolog 6-7 units with her night time meal. She reports that she doesn't eat much during the day and therefore doesn't use novolog during this time.   ROS: see HPI Past Medical History: reviewed and updated medications and allergies. Social History: Tobacco: 1/3 pack per day  Objective: Filed Vitals:   09/24/12 1601  BP: 118/80  Pulse: 85  Temp: 98.2 F (36.8 C)    Physical Examination:   General appearance - alert, well appearing, and in no distress Chest - clear to auscultation, no wheezes, rales or rhonchi, symmetric air entry Heart - normal rate, regular rhythm, normal S1, S2, no murmurs Abdomen - soft, nontender, nondistended, no masses or organomegaly Pelvic - normal appearing external genitalia, vagina and cervix. Copious thick white and odorous discharge present. Skin - postinflammatory macules on back, arms and under  breast, non scaly, non erythematous. No burrows or pustules

## 2012-10-19 ENCOUNTER — Other Ambulatory Visit (HOSPITAL_COMMUNITY): Payer: Self-pay | Admitting: Family Medicine

## 2012-11-29 ENCOUNTER — Ambulatory Visit: Payer: Medicaid Other | Admitting: Family Medicine

## 2013-01-03 ENCOUNTER — Ambulatory Visit: Payer: Medicaid Other | Admitting: Family Medicine

## 2013-01-07 ENCOUNTER — Telehealth: Payer: Self-pay | Admitting: Family Medicine

## 2013-01-08 ENCOUNTER — Encounter (HOSPITAL_COMMUNITY): Payer: Self-pay | Admitting: Emergency Medicine

## 2013-01-08 ENCOUNTER — Emergency Department (HOSPITAL_COMMUNITY): Payer: Medicaid Other

## 2013-01-08 ENCOUNTER — Emergency Department (HOSPITAL_COMMUNITY)
Admission: EM | Admit: 2013-01-08 | Discharge: 2013-01-08 | Disposition: A | Discharge status: Home / Self Care | Payer: Medicaid Other | Attending: Emergency Medicine | Admitting: Emergency Medicine

## 2013-01-08 DIAGNOSIS — F17200 Nicotine dependence, unspecified, uncomplicated: Secondary | ICD-10-CM | POA: Insufficient documentation

## 2013-01-08 DIAGNOSIS — S91309A Unspecified open wound, unspecified foot, initial encounter: Secondary | ICD-10-CM | POA: Insufficient documentation

## 2013-01-08 DIAGNOSIS — Z87448 Personal history of other diseases of urinary system: Secondary | ICD-10-CM | POA: Insufficient documentation

## 2013-01-08 DIAGNOSIS — Z8739 Personal history of other diseases of the musculoskeletal system and connective tissue: Secondary | ICD-10-CM | POA: Insufficient documentation

## 2013-01-08 DIAGNOSIS — S6392XA Sprain of unspecified part of left wrist and hand, initial encounter: Secondary | ICD-10-CM

## 2013-01-08 DIAGNOSIS — Y939 Activity, unspecified: Secondary | ICD-10-CM | POA: Insufficient documentation

## 2013-01-08 DIAGNOSIS — Z794 Long term (current) use of insulin: Secondary | ICD-10-CM | POA: Insufficient documentation

## 2013-01-08 DIAGNOSIS — S91311A Laceration without foreign body, right foot, initial encounter: Secondary | ICD-10-CM

## 2013-01-08 DIAGNOSIS — I10 Essential (primary) hypertension: Secondary | ICD-10-CM | POA: Insufficient documentation

## 2013-01-08 DIAGNOSIS — S6390XA Sprain of unspecified part of unspecified wrist and hand, initial encounter: Secondary | ICD-10-CM | POA: Insufficient documentation

## 2013-01-08 DIAGNOSIS — Y92009 Unspecified place in unspecified non-institutional (private) residence as the place of occurrence of the external cause: Secondary | ICD-10-CM | POA: Insufficient documentation

## 2013-01-08 DIAGNOSIS — Z8719 Personal history of other diseases of the digestive system: Secondary | ICD-10-CM | POA: Insufficient documentation

## 2013-01-08 DIAGNOSIS — W19XXXA Unspecified fall, initial encounter: Secondary | ICD-10-CM

## 2013-01-08 DIAGNOSIS — E109 Type 1 diabetes mellitus without complications: Secondary | ICD-10-CM | POA: Insufficient documentation

## 2013-01-08 DIAGNOSIS — Z23 Encounter for immunization: Secondary | ICD-10-CM | POA: Insufficient documentation

## 2013-01-08 DIAGNOSIS — W010XXA Fall on same level from slipping, tripping and stumbling without subsequent striking against object, initial encounter: Secondary | ICD-10-CM | POA: Insufficient documentation

## 2013-01-08 DIAGNOSIS — Z8709 Personal history of other diseases of the respiratory system: Secondary | ICD-10-CM | POA: Insufficient documentation

## 2013-01-08 MED ORDER — HYDROCODONE-ACETAMINOPHEN 5-325 MG PO TABS: 2.0000 | ORAL_TABLET | Freq: Four times a day (QID) | ORAL | Status: DC | PRN

## 2013-01-08 MED ORDER — IBUPROFEN 800 MG PO TABS
800.0000 mg | ORAL_TABLET | Freq: Three times a day (TID) | ORAL | Status: DC
  Administered 2013-01-08: 800 mg via ORAL
  Filled 2013-01-08: qty 1

## 2013-01-08 MED ORDER — HYDROCODONE-ACETAMINOPHEN 5-325 MG PO TABS
2.0000 | ORAL_TABLET | Freq: Once | ORAL | Status: AC
  Administered 2013-01-08: 2 via ORAL
  Filled 2013-01-08: qty 2

## 2013-01-08 MED ORDER — TETANUS-DIPHTH-ACELL PERTUSSIS 5-2.5-18.5 LF-MCG/0.5 IM SUSP
0.5000 mL | Freq: Once | INTRAMUSCULAR | Status: AC
  Administered 2013-01-08: 0.5 mL via INTRAMUSCULAR
  Filled 2013-01-08: qty 0.5

## 2013-01-08 NOTE — ED Notes (Signed)
Pt reports falling on L hand; now having L hand pain and limited ROM in L 4th and 5th digit; also has small lac to bottom of R foot; last tetanus unknown

## 2013-01-08 NOTE — ED Provider Notes (Signed)
CSN: [id number]     Arrival date & time 01/08/13  0216 History   First MD Initiated Contact with Patient 01/08/13 0330     Chief Complaint  Patient presents with  . Hand Pain   (Consider location/radiation/quality/duration/timing/severity/associated sxs/prior Treatment) HPI 32 year old female presents to emergency room from home with complaint of left hand pain.  Patient reports she tripped and fell on her outstretched left hand.  She is complaining of pain to the lateral aspect of her hand.  She reports difficulty moving her pinky and ring finger secondary to pain.  Patient also cut the bottom of her right foot.  Unknown last tetanus.  Bleeding is controlled.  No other injuries or complaints at this time. Past Medical History  Diagnosis Date  . Hyperlipidemia   . Hypertension   . Tobacco abuse   . Incarceration     Was in jail for selling drugs - out Oct 2010.  [name] Renal disorder   . Type I diabetes mellitus     A1c > 11. Patient does not check BG or take insulin regularly.  Dx at age 6.  . Chronic bronchitis     "get it q yr" (08/21/2012)  . Exertional shortness of breath   . GERD (gastroesophageal reflux disease)   . NWGNFAOZ(308.6)     "weekly" (08/21/2012)  . Arthritis     "legs and hands" (08/21/2012)   Past Surgical History  Procedure Laterality Date  . Tubal ligation  2009   History reviewed. No pertinent family history. History  Substance Use Topics  . Smoking status: Current Every Day Smoker -- 0.33 packs/day for 14 years    Types: Cigarettes  . Smokeless tobacco: Never Used  . Alcohol Use: 0.0 oz/week     Comment: 08/21/2012 "might drink 1-2 glasses of liquor once/month"   OB History   Grav Para Term Preterm Abortions TAB SAB Ect Mult Living                 Review of Systems  See History of Present Illness; otherwise all other systems are reviewed and negative Allergies  Review of patient's allergies indicates no known allergies.  Home Medications   Current  Outpatient Rx  Name  Route  Sig  Dispense  Refill  . insulin aspart (NOVOLOG) 100 UNIT/ML injection   Subcutaneous   Inject 5-6 Units into the skin 3 (three) times daily as needed. For CBG over 180   1 vial   10   . insulin glargine (LANTUS) 100 UNIT/ML injection   Subcutaneous   Inject 0.3 mLs (30 Units total) into the skin at bedtime.   10 mL   6   . lisinopril (PRINIVIL,ZESTRIL) 10 MG tablet   Oral   Take 1 tablet (10 mg total) by mouth daily.   90 tablet   3   . Multiple Vitamin (MULTIVITAMIN WITH MINERALS) TABS   Oral   Take 1 tablet by mouth every other day.           BP 132/82  Pulse 94  Temp(Src) 98.2 F (36.8 C) (Oral)  Resp 17  Ht 5\' 7"  (1.702 m)  Wt 155 lb (70.308 kg)  BMI 24.27 kg/m2  SpO2 95% Physical Exam  Nursing note and vitals reviewed. Constitutional: She is oriented to person, place, and time. She appears well-developed and well-nourished. No distress.  HENT:  Head: Normocephalic and atraumatic.  Nose: Nose normal.  Mouth/Throat: Oropharynx is clear and moist.  Musculoskeletal: She exhibits tenderness. She  exhibits no edema.  Patient with you-shaped laceration to her right heel.  Bleeding is controlled.  No foreign objects noted.  Patient has pain to palpation of this and fourth fingers of left hand, and a long metacarpals.  There is no edema, redness, deformity, or crepitus noted.  She has decreased range of motion of the finger secondary to pain.  Neurological: She is alert and oriented to person, place, and time.  Skin: Skin is warm and dry. No rash noted. She is not diaphoretic. No erythema. No pallor.    ED Course  Procedures (including critical care time) Labs Review Labs Reviewed - No data to display Imaging Review Dg Hand Complete Left  01/08/2013   CLINICAL DATA:  Status post fall; left hand injury. Pain at the left fourth and fifth fingers.  EXAM: LEFT HAND - COMPLETE 3+ VIEW  COMPARISON:  Left wrist radiographs performed 03/15/2004   FINDINGS: There is no evidence of fracture or dislocation. The joint spaces are preserved; the soft tissues are unremarkable in appearance. The carpal rows are intact, and demonstrate normal alignment.  IMPRESSION: No evidence of fracture or dislocation.   Electronically Signed   By: Roanna Raider M.D.   On: 01/08/2013 03:27      MDM   1. Fall at home, initial encounter   2. Hand sprain, left, initial encounter   3. Laceration of right heel without complication    32 year old female status post fall.  It appears that she has a sprain to her left hand, and a minor laceration to the bottom of her right foot.  Patient instructed to keep wound clean, watch for signs of infection.  We'll dress the wound here with bacitracin and Band-Aid.  Given pain to left hand, we'll place an ulnar splint for comfort and followup with her primary care Dr. for recheck, orthopedics, when necessary.  We'll update her tetanus    Olivia Mackie, MD 01/08/13 [id number]

## 2013-01-08 NOTE — ED Notes (Signed)
Patient is alert and orientedx4.  Patient was explained discharge instructions and they understood them with no questions.  The patient's friend, Ogechi Kuehnel is coming to take the patient home.

## 2013-01-08 NOTE — ED Notes (Signed)
Ortho tech called and informed of MD's order.

## 2013-01-08 NOTE — ED Notes (Signed)
Ortho at BS

## 2013-01-08 NOTE — ED Notes (Signed)
MD at BS

## 2013-01-08 NOTE — Telephone Encounter (Signed)
Entered in error  Marena Chancy, PGY-3 Family Medicine Resident

## 2013-01-10 ENCOUNTER — Ambulatory Visit: Payer: Medicaid Other | Admitting: Family Medicine

## 2013-01-13 ENCOUNTER — Ambulatory Visit: Payer: Medicaid Other | Admitting: Family Medicine

## 2013-01-14 ENCOUNTER — Encounter: Payer: Self-pay | Admitting: Family Medicine

## 2013-01-14 ENCOUNTER — Ambulatory Visit (INDEPENDENT_AMBULATORY_CARE_PROVIDER_SITE_OTHER): Payer: Medicaid Other | Admitting: Family Medicine

## 2013-01-14 VITALS — BP 100/62 | HR 91 | Ht 67.0 in | Wt 156.0 lb

## 2013-01-14 DIAGNOSIS — M79609 Pain in unspecified limb: Secondary | ICD-10-CM

## 2013-01-14 DIAGNOSIS — M79645 Pain in left finger(s): Secondary | ICD-10-CM

## 2013-01-14 DIAGNOSIS — N760 Acute vaginitis: Secondary | ICD-10-CM

## 2013-01-14 DIAGNOSIS — N898 Other specified noninflammatory disorders of vagina: Secondary | ICD-10-CM

## 2013-01-14 DIAGNOSIS — K3184 Gastroparesis: Secondary | ICD-10-CM

## 2013-01-14 DIAGNOSIS — E1065 Type 1 diabetes mellitus with hyperglycemia: Secondary | ICD-10-CM

## 2013-01-14 DIAGNOSIS — IMO0002 Reserved for concepts with insufficient information to code with codable children: Secondary | ICD-10-CM

## 2013-01-14 DIAGNOSIS — E119 Type 2 diabetes mellitus without complications: Secondary | ICD-10-CM

## 2013-01-14 LAB — POCT WET PREP (WET MOUNT)

## 2013-01-14 MED ORDER — FLUCONAZOLE 150 MG PO TABS: 150.0000 mg | ORAL_TABLET | Freq: Once | ORAL | Status: DC

## 2013-01-14 NOTE — Patient Instructions (Signed)
I am going to recheck an xray. If that is normal, I am referring you the hand center to get rehab on the finger. In the meantime, keep the splint on for comfort. You can apply ice to the area to help with the inflammation.  Make an appointment with me only for the diabetes so that we can focus on it fully.

## 2013-01-15 DIAGNOSIS — M79645 Pain in left finger(s): Secondary | ICD-10-CM | POA: Insufficient documentation

## 2013-01-15 NOTE — Assessment & Plan Note (Signed)
GI referral for evaluation for gastric emptying study.

## 2013-01-15 NOTE — Assessment & Plan Note (Signed)
No evidence of fracture on initial evaluation in the ED. Will repeat xray to completely rule out fracture since she still is having some discomfort.  Refer to OT with hand center to help with regain of function. Keep splint on for comfort.

## 2013-01-15 NOTE — Assessment & Plan Note (Signed)
Wet prep showing yeast: treat with diflucan 150mg  every 72hrs for 3 doses in setting of uncontrolled diabetes

## 2013-01-15 NOTE — Progress Notes (Signed)
Patient ID: [name]    DOB: 07/12/1980, 32 y.o.   MRN: [id number] --- Subjective:  [name] is a 32 y.o.female who presents for follow up on finger pain as well as concern for yeast infection.   - follow up on finger pain: was seen in the ED for injury of the left 4th and 5th finger. Xray did not show fracture. She was fitted with an ulnar splint for comfort. Since then, she continues to have pain and difficulty moving her fingers which creates pain. Swelling has improved. Pain is worst at night. She has been wearing the splint 24/7 except for when she showers. She has been taking the vicodin that was prescribed which helps. Also takes ibuprofen which had same effect at vicodin on pain.   - vaginal discharge and itching: ongoing for 1 month. She is on a daily antibiotic for prevention of UTI for recurrent UTI's. Discharge is white and thick.   - diabetes: has not been monitoring her CBG's on a regular basis due to nto always having time to check due to work or looking for work. She states that she has not had a stable home in the last few months and has been living at different relatives' and family's houses with her children. She thinks she will be able to find something in the near future. This has made managing her DM difficult. She uses lantus 30units at bedtime and novlog 4-5 units at meals when she eats. She has had symptomatic hypoglycemia in the morning time a few times in the month. She reports some numbness ans stiffness in her thighs and knees but not her feet.   ROS: see HPI Past Medical History: reviewed and updated medications and allergies. Social History: Tobacco: current every day smoker  Objective: Filed Vitals:   01/14/13 1618  BP: 100/62  Pulse: 91    Physical Examination:   General appearance - alert, well appearing, and in no distress Left hand: normal range of motion of wrist, 4th and 5th fingers with mild soft tissue swelling, able to fully bend at MCP joints,  limited range of motion of PIP and DIP joints due to pain but able to bend a minimum No tenderness along anatomic snuffbox or along rest of the hand.

## 2013-01-15 NOTE — Assessment & Plan Note (Signed)
Uncontrolled due to non compliance which stems from poor home environment where patient doesn't have a permanent home where to stay.  Strongly stressed that I needed her to make an appointment that is only focused on the diabetes so that we can address it fully.  A1C elevated. Will call patient to touch base about any adjustments.

## 2013-01-17 ENCOUNTER — Other Ambulatory Visit: Payer: Self-pay | Admitting: Family Medicine

## 2013-01-17 DIAGNOSIS — E1065 Type 1 diabetes mellitus with hyperglycemia: Secondary | ICD-10-CM

## 2013-01-17 DIAGNOSIS — IMO0002 Reserved for concepts with insufficient information to code with codable children: Secondary | ICD-10-CM

## 2013-01-17 MED ORDER — CVS LANCETS ORIGINAL MISC: 1.0000 | Freq: Three times a day (TID) | Status: DC

## 2013-01-17 MED ORDER — INSULIN PEN NEEDLE 29G X 12.7MM MISC: 1.0000 | Freq: Three times a day (TID) | Status: DC

## 2013-02-06 ENCOUNTER — Telehealth: Payer: Self-pay | Admitting: *Deleted

## 2013-02-06 ENCOUNTER — Other Ambulatory Visit: Payer: Self-pay | Admitting: Family Medicine

## 2013-02-06 DIAGNOSIS — IMO0002 Reserved for concepts with insufficient information to code with codable children: Secondary | ICD-10-CM

## 2013-02-06 DIAGNOSIS — E1065 Type 1 diabetes mellitus with hyperglycemia: Secondary | ICD-10-CM

## 2013-02-06 NOTE — Telephone Encounter (Signed)
LMOVM for pt to return call.  Please have pt make a fasting lab appt to check cholesterol.  Future orders in. Arvis Miguez Dawn 

## 2013-03-06 ENCOUNTER — Other Ambulatory Visit: Payer: Self-pay | Admitting: Family Medicine

## 2013-03-12 ENCOUNTER — Ambulatory Visit: Payer: Medicaid Other | Admitting: Family Medicine

## 2013-04-15 ENCOUNTER — Other Ambulatory Visit (HOSPITAL_COMMUNITY)
Admission: RE | Admit: 2013-04-15 | Discharge: 2013-04-15 | Disposition: A | Discharge status: Home / Self Care | Payer: Medicaid Other | Source: Ambulatory Visit | Attending: Family Medicine | Admitting: Family Medicine

## 2013-04-15 ENCOUNTER — Ambulatory Visit (INDEPENDENT_AMBULATORY_CARE_PROVIDER_SITE_OTHER): Payer: Medicaid Other | Admitting: Family Medicine

## 2013-04-15 VITALS — BP 145/95 | HR 88 | Temp 98.5°F | Wt 154.0 lb

## 2013-04-15 DIAGNOSIS — Z20828 Contact with and (suspected) exposure to other viral communicable diseases: Secondary | ICD-10-CM

## 2013-04-15 DIAGNOSIS — E1065 Type 1 diabetes mellitus with hyperglycemia: Secondary | ICD-10-CM

## 2013-04-15 DIAGNOSIS — N898 Other specified noninflammatory disorders of vagina: Secondary | ICD-10-CM

## 2013-04-15 DIAGNOSIS — Z309 Encounter for contraceptive management, unspecified: Secondary | ICD-10-CM

## 2013-04-15 DIAGNOSIS — N760 Acute vaginitis: Secondary | ICD-10-CM

## 2013-04-15 DIAGNOSIS — IMO0002 Reserved for concepts with insufficient information to code with codable children: Secondary | ICD-10-CM

## 2013-04-15 DIAGNOSIS — Z113 Encounter for screening for infections with a predominantly sexual mode of transmission: Secondary | ICD-10-CM | POA: Insufficient documentation

## 2013-04-15 DIAGNOSIS — IMO0001 Reserved for inherently not codable concepts without codable children: Secondary | ICD-10-CM

## 2013-04-15 LAB — POCT URINALYSIS DIPSTICK
Bilirubin, UA: NEGATIVE
Glucose, UA: NEGATIVE
Ketones, UA: NEGATIVE
Leukocytes, UA: NEGATIVE
Nitrite, UA: NEGATIVE
Protein, UA: NEGATIVE
RBC UA: NEGATIVE
SPEC GRAV UA: 1.015
UROBILINOGEN UA: 0.2
pH, UA: 7.5

## 2013-04-15 LAB — POCT WET PREP (WET MOUNT): Clue Cells Wet Prep Whiff POC: POSITIVE

## 2013-04-15 LAB — POCT URINE PREGNANCY: Preg Test, Ur: NEGATIVE

## 2013-04-15 MED ORDER — METRONIDAZOLE 500 MG PO TABS: 500.0000 mg | ORAL_TABLET | Freq: Two times a day (BID) | ORAL | Status: DC

## 2013-04-15 MED ORDER — NORGESTIMATE-ETH ESTRADIOL 0.25-35 MG-MCG PO TABS: 1.0000 | ORAL_TABLET | Freq: Every day | ORAL | Status: DC

## 2013-04-15 MED ORDER — FLUCONAZOLE 150 MG PO TABS: 150.0000 mg | ORAL_TABLET | Freq: Once | ORAL | Status: DC

## 2013-04-15 NOTE — Progress Notes (Signed)
   Subjective:    Patient ID: [name], female    DOB: 11/06/1980, 33 y.o.   MRN: [id number]  HPI  33 yo F DM1 presents for SD visit:  1. Vaginal discharge: x two weeks. Brown-white. Having sex. Two partners. Using condoms. No lesions, itching, pelvic pain. Noted a bump on L medial thigh that has been present x 2 months occasionally drains.   Soc hx: smoker  Review of Systems As per HPI     Objective:   Physical Exam BP 145/95  Pulse 88  Temp(Src) 98.5 F (36.9 C) (Oral)  Wt 154 lb (69.854 kg)  LMP 03/19/2013 General appearance: alert, cooperative and no distress Pelvic: cervix normal in appearance, external genitalia normal, no adnexal masses or tenderness, no cervical motion tenderness, positive findings: vaginal discharge:  white, brown, thick and curd-like, rectovaginal septum normal and uterus normal size, shape, and consistency  R medial thigh x 1x1 cm cystic papule, mildly fluctuant, no drainage, no redness.  U preg: negative UA: wnl Wet prep: BV and yeast CG/Chlam/RPR/HIV: negative       Assessment & Plan:

## 2013-04-15 NOTE — Patient Instructions (Signed)
Thank you for coming in today. BV and yeast on wet prep-treating. sprintec for contraception.  Schedule f/u with Dr. Gwenlyn SaranLosq for next week to f/u diabetes and cyst.  Dr. Armen PickupFunches

## 2013-04-16 ENCOUNTER — Telehealth: Payer: Self-pay | Admitting: *Deleted

## 2013-04-16 DIAGNOSIS — IMO0001 Reserved for inherently not codable concepts without codable children: Secondary | ICD-10-CM | POA: Insufficient documentation

## 2013-04-16 LAB — HIV ANTIBODY (ROUTINE TESTING W REFLEX): HIV: NONREACTIVE

## 2013-04-16 LAB — CERVICOVAGINAL ANCILLARY ONLY
Chlamydia: NEGATIVE
Neisseria Gonorrhea: NEGATIVE

## 2013-04-16 LAB — RPR

## 2013-04-16 NOTE — Assessment & Plan Note (Signed)
A: negative U preg. Does not desire pregnancy. Having sex. Smoker. No h/o DVT or PE.  P: Sprintec.  Smoking cessation

## 2013-04-16 NOTE — Telephone Encounter (Signed)
Pt is aware of results. Jazmin Hartsell,CMA  

## 2013-04-16 NOTE — Telephone Encounter (Signed)
Message copied by Henri MedalHARTSELL, JAZMIN M on Wed Apr 16, 2013  4:21 PM ------      Message from: Dessa PhiFUNCHES, JOSALYN      Created: Wed Apr 16, 2013  3:59 PM       Negative HIV, RPR, GC/chlam.      Please inform patient. ------

## 2013-04-16 NOTE — Assessment & Plan Note (Signed)
A: BV and yeast P: treated.

## 2013-05-01 ENCOUNTER — Other Ambulatory Visit: Payer: Self-pay | Admitting: Family Medicine

## 2013-05-08 ENCOUNTER — Other Ambulatory Visit: Payer: Self-pay

## 2013-05-26 ENCOUNTER — Telehealth: Payer: Self-pay | Admitting: *Deleted

## 2013-05-26 NOTE — Telephone Encounter (Signed)
[facility] number for patient; NPI number given.   Clovis Puamika L Kiandra Sanguinetti, RN

## 2013-05-27 IMAGING — CT CT ABD-PELV W/ CM
2 of 4 series · 16 of 46 positions shown, 18 images · IV contrast (APPLIED)
Comparison: CT 12/14/2011, pelvic ultrasound 03/13/2012

CLINICAL DATA: Abdominal pain, nausea, vomiting

CT ABDOMEN AND PELVIS WITH CONTRAST
TECHNIQUE: Multidetector CT imaging of the abdomen and pelvis was
performed following the standard protocol during bolus
administration of intravenous contrast.
Contrast: 80mL OMNIPAQUE IOHEXOL 300 MG/ML  SOLN

[Series 2: abd/pelv with 5.0 b31f st · axial · 0.59mm/px · z∈[-494,-88]mm · 13 of 89 slices shown, 15 images]
[im 4/89  soft-tissue]
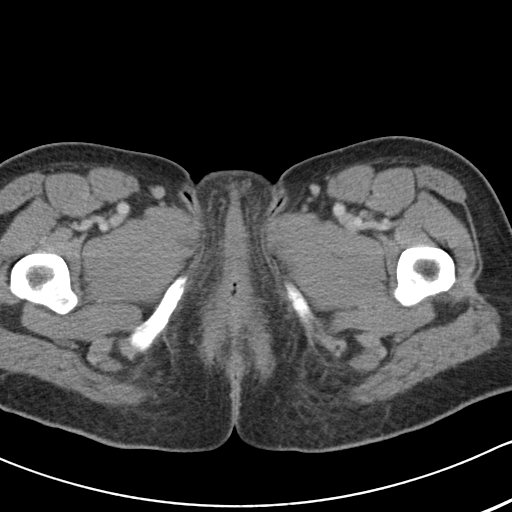
[im 4/89  bone]
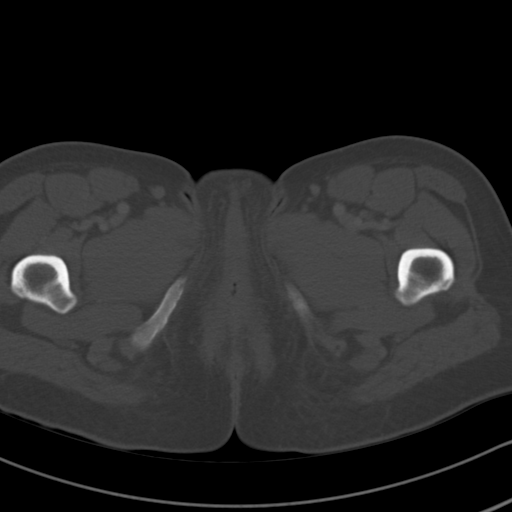
[im 11/89  soft-tissue]
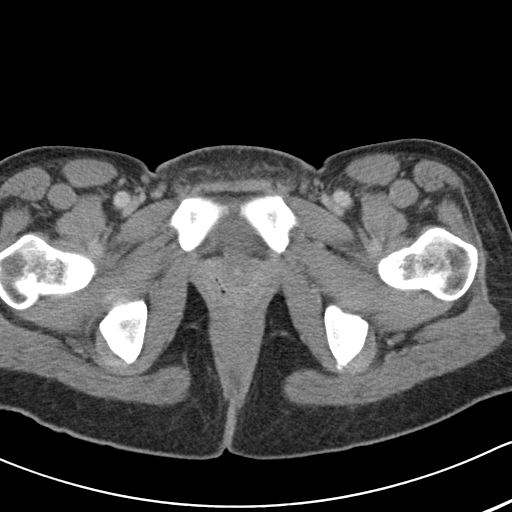
[im 17/89  soft-tissue]
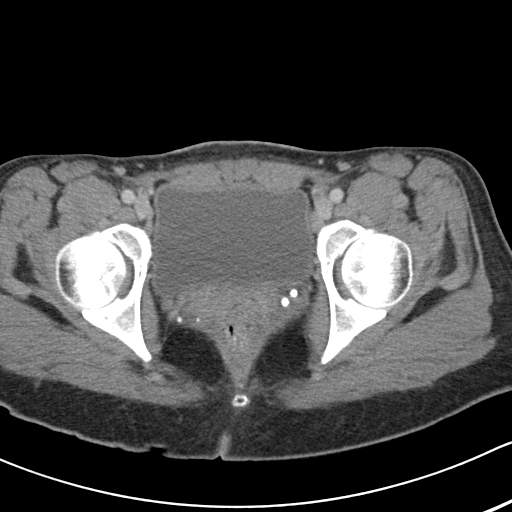
[im 24/89  soft-tissue]
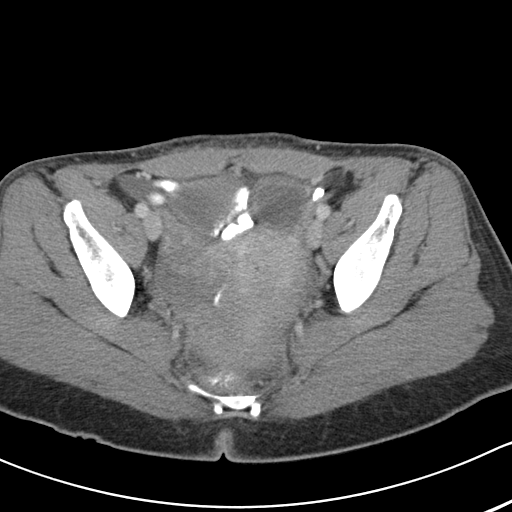
[im 31/89  soft-tissue]
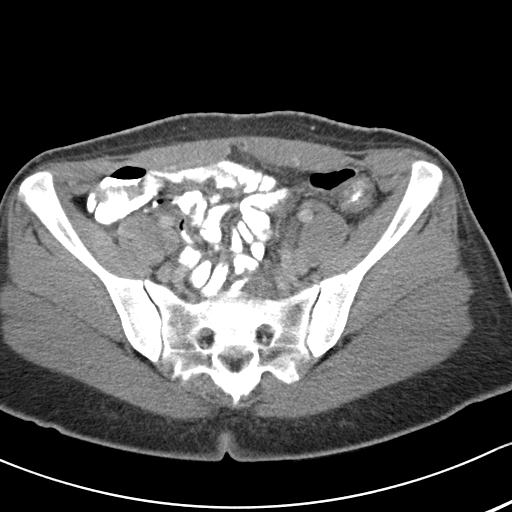
[im 38/89  soft-tissue]
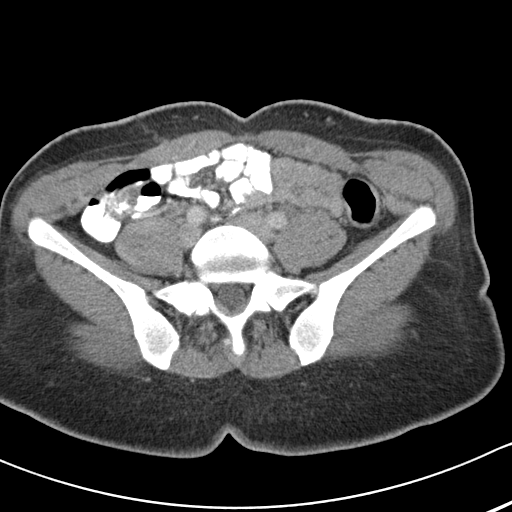
[im 45/89  soft-tissue]
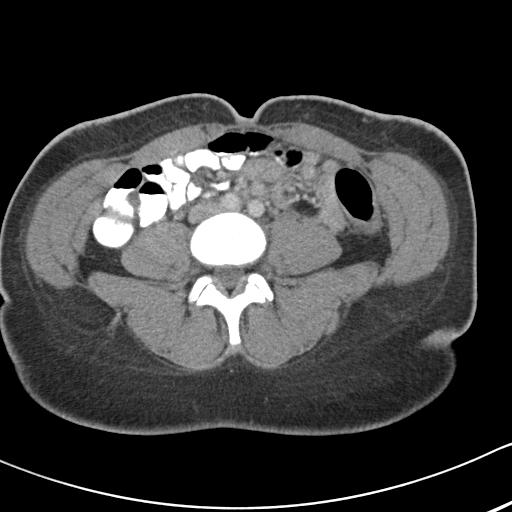
[im 51/89  soft-tissue]
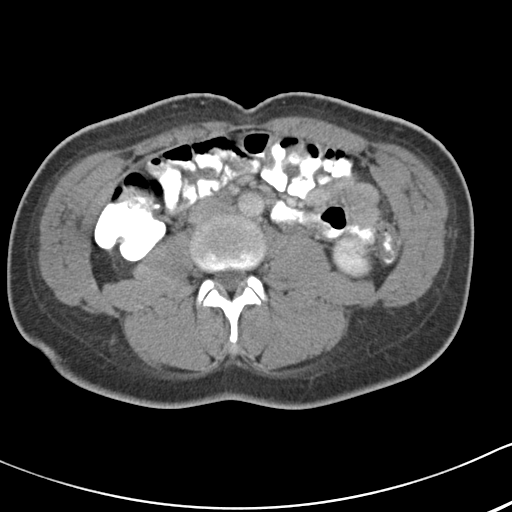
[im 58/89  soft-tissue]
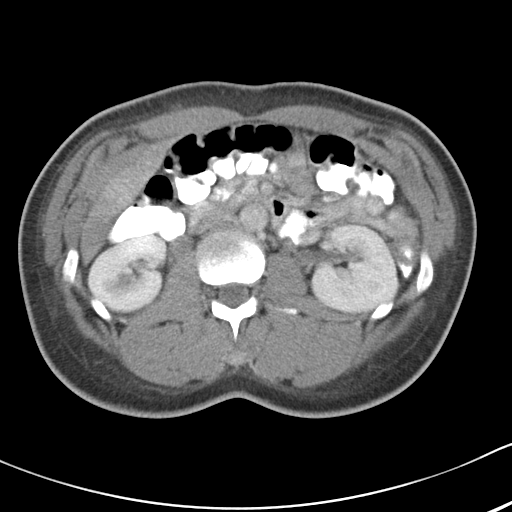
[im 58/89  bone]
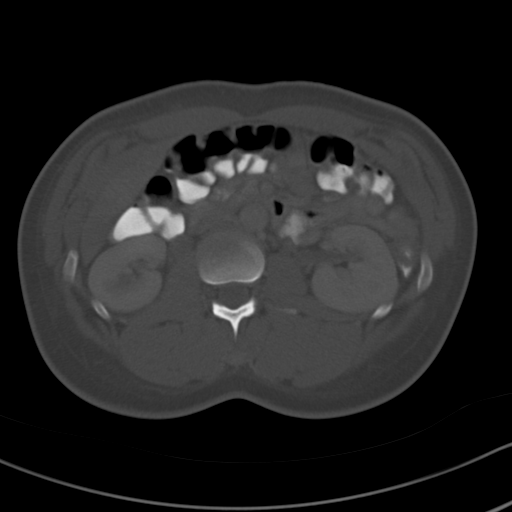
[im 65/89  soft-tissue]
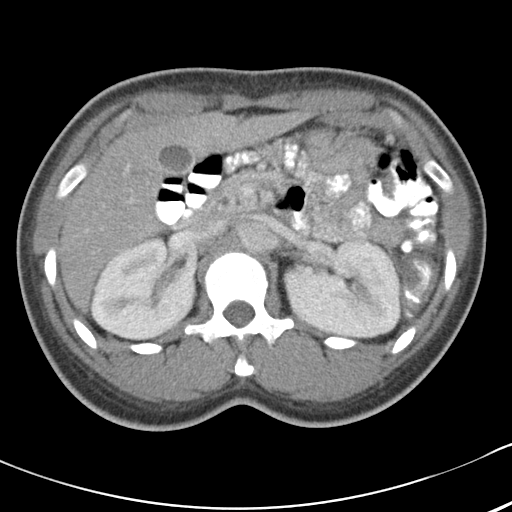
[im 72/89  soft-tissue]
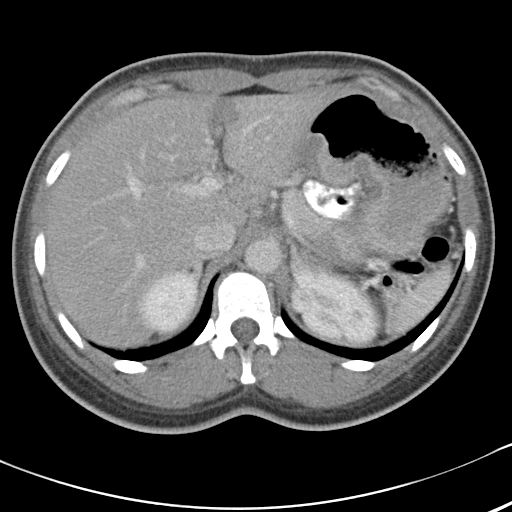
[im 78/89  soft-tissue]
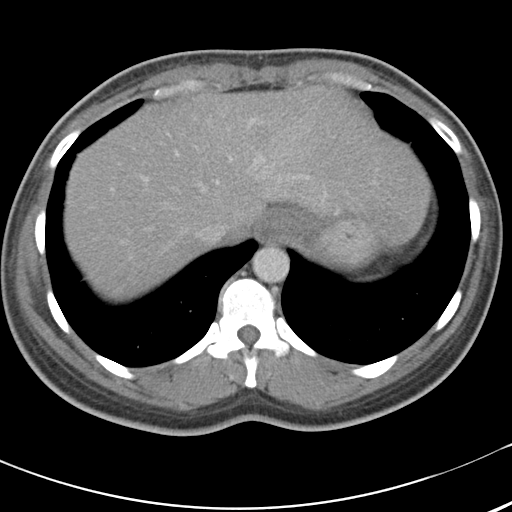
[im 85/89  soft-tissue]
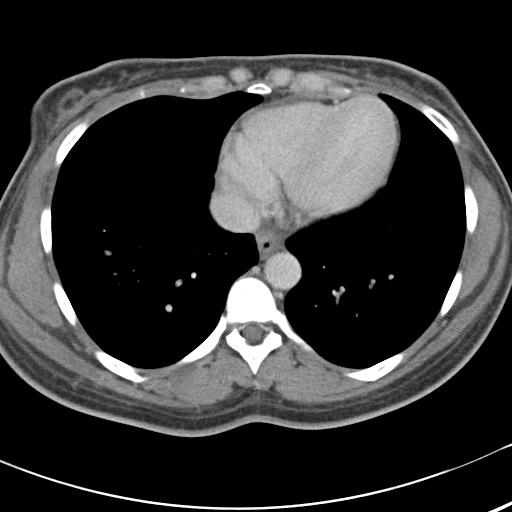

[Series 5: coronals · coronal · 0.68mm/px · 3 of 83 slices shown]
[im 28/83  soft-tissue]
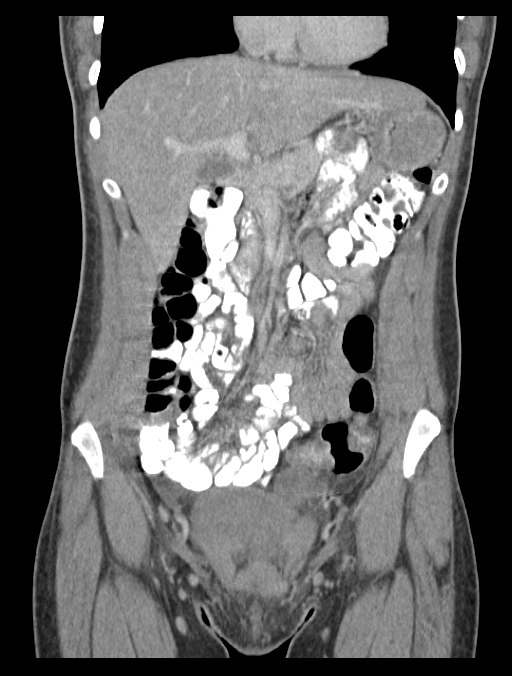
[im 37/83  soft-tissue]
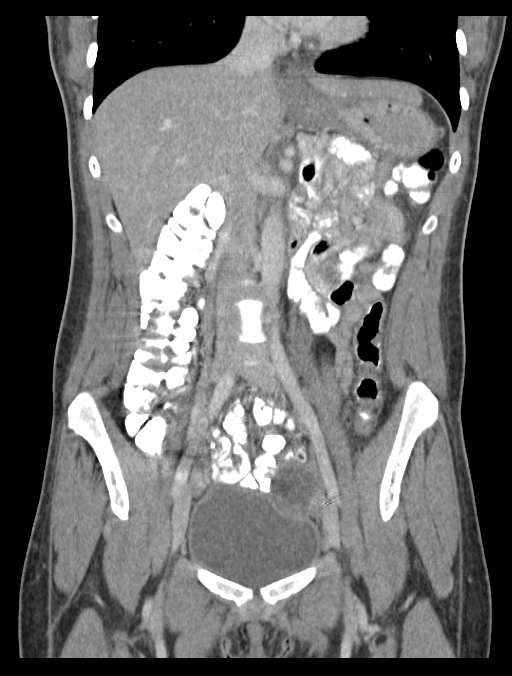
[im 46/83  soft-tissue]
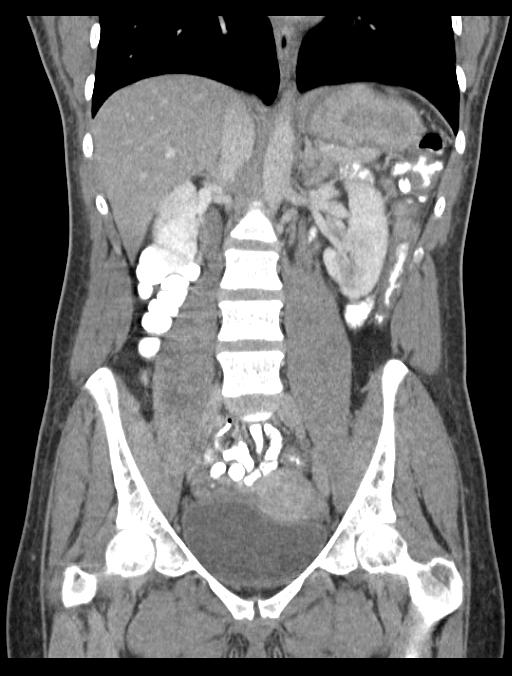

[16 of 46 positions shown; findings below may reference images not displayed]

FINDINGS: Lung bases are clear.  No pericardial fluid.

There is no focal hepatic lesion.  Gallbladder, pancreas, spleen,
adrenal glands are normal.  There is mild cortical scarring in the
lateral left renal cortex (image 22).  This is site of prior renal
abscess.  No hydronephrosis.

The stomach, small bowel, appendix, and cecum are normal.  The
colon and rectosigmoid colon are normal.  Oral contrast transits
the entirety of the bowel to the rectum.

Abdominal aorta is normal caliber.  No retroperitoneal or
periportal lymphadenopathy.  No free fluid the pelvis.  The uterus
and ovaries are normal.  There is a functional ovarian cyst of the
left ovary measuring 3.6 cm.  This is described on comparison
ultrasound.  Bladder is normal.  Review of  bone windows
demonstrates no aggressive osseous lesions.
IMPRESSION: 1..  No acute abdominal or pelvic findings.

2.  Left renal scarring.
3.  Functional ovarian cyst of the left ovary.
4..  No evidence of bowel obstruction.

## 2013-05-29 ENCOUNTER — Other Ambulatory Visit: Payer: Self-pay | Admitting: Family Medicine

## 2013-07-03 ENCOUNTER — Ambulatory Visit: Payer: Medicaid Other | Admitting: Emergency Medicine

## 2013-07-16 ENCOUNTER — Ambulatory Visit: Payer: Medicaid Other | Admitting: Emergency Medicine

## 2013-07-17 ENCOUNTER — Ambulatory Visit (INDEPENDENT_AMBULATORY_CARE_PROVIDER_SITE_OTHER): Payer: Medicaid Other | Admitting: Family Medicine

## 2013-07-17 ENCOUNTER — Encounter: Payer: Self-pay | Admitting: Family Medicine

## 2013-07-17 VITALS — BP 104/59 | HR 85 | Temp 98.6°F | Ht 67.0 in | Wt 142.8 lb

## 2013-07-17 DIAGNOSIS — T148XXA Other injury of unspecified body region, initial encounter: Secondary | ICD-10-CM

## 2013-07-17 DIAGNOSIS — IMO0002 Reserved for concepts with insufficient information to code with codable children: Secondary | ICD-10-CM

## 2013-07-17 DIAGNOSIS — H60399 Other infective otitis externa, unspecified ear: Secondary | ICD-10-CM

## 2013-07-17 DIAGNOSIS — H6093 Unspecified otitis externa, bilateral: Secondary | ICD-10-CM | POA: Insufficient documentation

## 2013-07-17 MED ORDER — CIPROFLOXACIN-DEXAMETHASONE 0.3-0.1 % OT SUSP: 4.0000 [drp] | Freq: Two times a day (BID) | OTIC | Status: AC

## 2013-07-17 MED ORDER — DOXYCYCLINE HYCLATE 100 MG PO TABS: 100.0000 mg | ORAL_TABLET | Freq: Two times a day (BID) | ORAL | Status: DC

## 2013-07-17 MED ORDER — FLUCONAZOLE 150 MG PO TABS: 150.0000 mg | ORAL_TABLET | ORAL | Status: DC

## 2013-07-17 NOTE — Progress Notes (Signed)
   Subjective:    Patient ID: [name], female    DOB: 05/31/1980, 33 y.o.   MRN: [id number] CC: Sore of L, b/l ear drainage.  HPI 33 yo F with IDDM type I, smoker presents for SD visit for the following:  1. Sore on L leg: s/p trip and fall on brick steps one week ago. Resulted in leg abrasions x 2. With tenderness and pain x 2 days. Treating with topical antibiotic ointment. Also hurt L 4th toe. Swelling. Pain improving. Full ROM.   2. B/l ear pain and drainage: x one month. No fever. R >L. No history of recurrent OM.   3.  Vaginal discharge: deferred   Soc Hx: current smoker  Review of Systems As per HPI      Objective:   Physical Exam BP 104/59  Pulse 85  Temp(Src) 98.6 F (37 C) (Oral)  Ht 5\' 7"  (1.702 m)  Wt 142 lb 12.8 oz (64.774 kg)  BMI 22.36 kg/m2 General appearance: alert, cooperative and no distress Ears: no adenopathy, no tenderness, external ear with cerumen and mild clear drainage. TMs nml b/l  Extremities: Abrasions R leg x 2 down to dermis.  1. Superior: 10 mm x 6 mm abrasion (15 mm x 12 mm) area of erythema and tenderness 2. Inferior geographic: 30 mm x 23 mm abrasion (40 mm x 28 mm) area of erythema and tenderness. 2+ DP pulses.     Assessment & Plan:

## 2013-07-17 NOTE — Patient Instructions (Signed)
Ms. [name],  Thank you for coming in today.  1. For ear: otitis externa ciprodex for 7 days  No q tips   2. For skin abrasions from fall  Keep covered when out and continue topical antibiotic ointment. 10 day course of doxycyline followed for diflucan to prevent yeast.  Call and come back for worsening pain, swelling, drainage, spreading of redness.   Dr. Armen PickupFunches

## 2013-07-17 NOTE — Assessment & Plan Note (Signed)
A: For bilateral otitis externa P:  ciprodex for 7 days  No q tips

## 2013-07-17 NOTE — Assessment & Plan Note (Addendum)
A: L leg following fall. Concerned about erythema and pain in the setting of IDDM type 1, smoker, P: Keep covered when out and continue topical antibiotic ointment. 10 day course of doxycyline followed for diflucan to prevent yeast. Call and come back for worsening pain, swelling, drainage, spreading of redness.

## 2013-07-21 ENCOUNTER — Ambulatory Visit (INDEPENDENT_AMBULATORY_CARE_PROVIDER_SITE_OTHER): Payer: Medicaid Other | Admitting: Family Medicine

## 2013-07-21 ENCOUNTER — Encounter: Payer: Self-pay | Admitting: Family Medicine

## 2013-07-21 ENCOUNTER — Other Ambulatory Visit (HOSPITAL_COMMUNITY)
Admission: RE | Admit: 2013-07-21 | Discharge: 2013-07-21 | Disposition: A | Discharge status: Home / Self Care | Payer: Medicaid Other | Source: Ambulatory Visit | Attending: Family Medicine | Admitting: Family Medicine

## 2013-07-21 VITALS — BP 129/88 | HR 77 | Ht 67.0 in | Wt 138.0 lb

## 2013-07-21 DIAGNOSIS — N760 Acute vaginitis: Secondary | ICD-10-CM

## 2013-07-21 DIAGNOSIS — B3731 Acute candidiasis of vulva and vagina: Secondary | ICD-10-CM | POA: Insufficient documentation

## 2013-07-21 DIAGNOSIS — N898 Other specified noninflammatory disorders of vagina: Secondary | ICD-10-CM

## 2013-07-21 DIAGNOSIS — Z113 Encounter for screening for infections with a predominantly sexual mode of transmission: Secondary | ICD-10-CM | POA: Insufficient documentation

## 2013-07-21 DIAGNOSIS — B373 Candidiasis of vulva and vagina: Secondary | ICD-10-CM | POA: Insufficient documentation

## 2013-07-21 LAB — POCT WET PREP (WET MOUNT): CLUE CELLS WET PREP WHIFF POC: POSITIVE

## 2013-07-21 MED ORDER — CIPROFLOXACIN HCL 250 MG PO TABS: ORAL_TABLET | ORAL | Status: DC

## 2013-07-21 NOTE — Progress Notes (Signed)
   Subjective:    Patient ID: [name], female    DOB: 09/09/1980, 33 y.o.   MRN: [id number]  HPI 33 y/o female presents for evaluation of vaginal discharge, has been present for two weeks, no associated fevers/chills/abdominal pain. Recently sexually active with female partner (boyfriend that she recently broke up with), she is concerned for STD exposure, has previous history of chlamydia and trichomonas, LMP 2 weeks ago, did not have period for 2 months prior, menses lasted 4 days which is normal for her.    Review of Systems  Constitutional: Negative for fever, chills and fatigue.  Gastrointestinal: Negative for nausea, vomiting, abdominal pain and diarrhea.       Objective:   Physical Exam Vitals: reviewed GU: chaperone present, normal external female anatomy, brown discharge noted on speculum examination, normal appearing cervix, bimanual exam identified no cervical motion tenderness, no gross mass  HIV and RPR negative in March 2015    Assessment & Plan:  Please see problem specific assessment and plan.

## 2013-07-21 NOTE — Assessment & Plan Note (Signed)
Patient presents for evaluation of vaginal discharge. -Wet prep and GC/Chlam cultures obtatined -Patient will be notified of results -HIV and RPR recently done in March 2015 and negative therefore not repeated

## 2013-07-21 NOTE — Patient Instructions (Signed)
Dr. Fletke will call you with your results. 

## 2013-07-22 ENCOUNTER — Telehealth: Payer: Self-pay | Admitting: Family Medicine

## 2013-07-22 DIAGNOSIS — B9689 Other specified bacterial agents as the cause of diseases classified elsewhere: Secondary | ICD-10-CM | POA: Insufficient documentation

## 2013-07-22 DIAGNOSIS — N760 Acute vaginitis: Secondary | ICD-10-CM | POA: Insufficient documentation

## 2013-07-22 MED ORDER — METRONIDAZOLE 500 MG PO TABS: 500.0000 mg | ORAL_TABLET | Freq: Two times a day (BID) | ORAL | Status: DC

## 2013-07-22 NOTE — Telephone Encounter (Signed)
Pt called and would like her test results from 6/22. jw

## 2013-07-22 NOTE — Assessment & Plan Note (Signed)
Wet prep positive for Bacterial Vaginosis. -start Flagyl 500 mg BID for 7 days

## 2013-07-22 NOTE — Telephone Encounter (Signed)
Discussed wet prep with patient. Will start treatment for BV.

## 2013-07-23 ENCOUNTER — Telehealth: Payer: Self-pay | Admitting: Family Medicine

## 2013-07-23 NOTE — Telephone Encounter (Signed)
Discussed results with patient

## 2013-07-24 ENCOUNTER — Encounter (HOSPITAL_COMMUNITY): Payer: Self-pay | Admitting: Emergency Medicine

## 2013-07-24 ENCOUNTER — Inpatient Hospital Stay (HOSPITAL_COMMUNITY)
Admission: EM | Admit: 2013-07-24 | Discharge: 2013-07-25 | DRG: 392 | Disposition: A | Discharge status: Home / Self Care | Payer: Medicaid Other | Attending: Family Medicine | Admitting: Family Medicine

## 2013-07-24 DIAGNOSIS — E1065 Type 1 diabetes mellitus with hyperglycemia: Secondary | ICD-10-CM

## 2013-07-24 DIAGNOSIS — K3184 Gastroparesis: Secondary | ICD-10-CM | POA: Diagnosis present

## 2013-07-24 DIAGNOSIS — F1210 Cannabis abuse, uncomplicated: Secondary | ICD-10-CM | POA: Diagnosis present

## 2013-07-24 DIAGNOSIS — K219 Gastro-esophageal reflux disease without esophagitis: Secondary | ICD-10-CM | POA: Diagnosis present

## 2013-07-24 DIAGNOSIS — A499 Bacterial infection, unspecified: Secondary | ICD-10-CM

## 2013-07-24 DIAGNOSIS — E7800 Pure hypercholesterolemia, unspecified: Secondary | ICD-10-CM

## 2013-07-24 DIAGNOSIS — R1115 Cyclical vomiting syndrome unrelated to migraine: Secondary | ICD-10-CM | POA: Diagnosis present

## 2013-07-24 DIAGNOSIS — I10 Essential (primary) hypertension: Secondary | ICD-10-CM | POA: Diagnosis present

## 2013-07-24 DIAGNOSIS — E1049 Type 1 diabetes mellitus with other diabetic neurological complication: Secondary | ICD-10-CM | POA: Diagnosis present

## 2013-07-24 DIAGNOSIS — N760 Acute vaginitis: Secondary | ICD-10-CM | POA: Diagnosis present

## 2013-07-24 DIAGNOSIS — B9689 Other specified bacterial agents as the cause of diseases classified elsewhere: Secondary | ICD-10-CM | POA: Diagnosis present

## 2013-07-24 DIAGNOSIS — F17200 Nicotine dependence, unspecified, uncomplicated: Secondary | ICD-10-CM | POA: Diagnosis present

## 2013-07-24 DIAGNOSIS — E860 Dehydration: Secondary | ICD-10-CM | POA: Diagnosis present

## 2013-07-24 DIAGNOSIS — IMO0002 Reserved for concepts with insufficient information to code with codable children: Secondary | ICD-10-CM

## 2013-07-24 DIAGNOSIS — R112 Nausea with vomiting, unspecified: Secondary | ICD-10-CM | POA: Diagnosis present

## 2013-07-24 DIAGNOSIS — Z794 Long term (current) use of insulin: Secondary | ICD-10-CM

## 2013-07-24 DIAGNOSIS — X58XXXA Exposure to other specified factors, initial encounter: Secondary | ICD-10-CM | POA: Diagnosis present

## 2013-07-24 DIAGNOSIS — E103599 Type 1 diabetes mellitus with proliferative diabetic retinopathy without macular edema, unspecified eye: Secondary | ICD-10-CM | POA: Diagnosis present

## 2013-07-24 LAB — CBC WITH DIFFERENTIAL/PLATELET
Basophils Absolute: 0 10*3/uL (ref 0.0–0.1)
Basophils Relative: 0 % (ref 0–1)
EOS ABS: 0.2 10*3/uL (ref 0.0–0.7)
Eosinophils Relative: 1 % (ref 0–5)
HCT: 37 % (ref 36.0–46.0)
Hemoglobin: 12.5 g/dL (ref 12.0–15.0)
Lymphocytes Relative: 11 % — ABNORMAL LOW (ref 12–46)
Lymphs Abs: 1.4 10*3/uL (ref 0.7–4.0)
MCH: 30 pg (ref 26.0–34.0)
MCHC: 33.8 g/dL (ref 30.0–36.0)
MCV: 88.7 fL (ref 78.0–100.0)
Monocytes Absolute: 0.7 10*3/uL (ref 0.1–1.0)
Monocytes Relative: 5 % (ref 3–12)
NEUTROS PCT: 83 % — AB (ref 43–77)
Neutro Abs: 10.4 10*3/uL — ABNORMAL HIGH (ref 1.7–7.7)
PLATELETS: 245 10*3/uL (ref 150–400)
RBC: 4.17 MIL/uL (ref 3.87–5.11)
RDW: 14.2 % (ref 11.5–15.5)
WBC: 12.6 10*3/uL — ABNORMAL HIGH (ref 4.0–10.5)

## 2013-07-24 LAB — COMPREHENSIVE METABOLIC PANEL
ALK PHOS: 61 U/L (ref 39–117)
ALT: 22 U/L (ref 0–35)
AST: 26 U/L (ref 0–37)
Albumin: 4.6 g/dL (ref 3.5–5.2)
BUN: 12 mg/dL (ref 6–23)
CO2: 23 mEq/L (ref 19–32)
Calcium: 9.7 mg/dL (ref 8.4–10.5)
Chloride: 98 mEq/L (ref 96–112)
Creatinine, Ser: 0.72 mg/dL (ref 0.50–1.10)
GFR calc Af Amer: >90 mL/min (ref >90)
GFR calc non Af Amer: >90 mL/min (ref >90)
Glucose, Bld: 261 mg/dL — ABNORMAL HIGH (ref 70–99)
POTASSIUM: 3.7 meq/L (ref 3.7–5.3)
SODIUM: 141 meq/L (ref 137–147)
TOTAL PROTEIN: 8.1 g/dL (ref 6.0–8.3)
Total Bilirubin: 0.5 mg/dL (ref 0.3–1.2)

## 2013-07-24 LAB — URINALYSIS, ROUTINE W REFLEX MICROSCOPIC
Bilirubin Urine: NEGATIVE
Glucose, UA: >1000 mg/dL — AB
HGB URINE DIPSTICK: NEGATIVE
Ketones, ur: 15 mg/dL — AB
LEUKOCYTES UA: NEGATIVE
NITRITE: NEGATIVE
Protein, ur: NEGATIVE mg/dL
SPECIFIC GRAVITY, URINE: 1.026 (ref 1.005–1.030)
UROBILINOGEN UA: 0.2 mg/dL (ref 0.0–1.0)
pH: 6 (ref 5.0–8.0)

## 2013-07-24 LAB — CBG MONITORING, ED: GLUCOSE-CAPILLARY: 249 mg/dL — AB (ref 70–99)

## 2013-07-24 LAB — URINE MICROSCOPIC-ADD ON

## 2013-07-24 LAB — POC URINE PREG, ED: Preg Test, Ur: NEGATIVE

## 2013-07-24 LAB — POC OCCULT BLOOD, ED: Fecal Occult Bld: NEGATIVE

## 2013-07-24 LAB — LIPASE, BLOOD: LIPASE: 17 U/L (ref 11–59)

## 2013-07-24 MED ORDER — METOCLOPRAMIDE HCL 5 MG/ML IJ SOLN
10.0000 mg | Freq: Once | INTRAMUSCULAR | Status: AC
  Administered 2013-07-24: 10 mg via INTRAVENOUS
  Filled 2013-07-24: qty 2

## 2013-07-24 MED ORDER — SODIUM CHLORIDE 0.9 % IV BOLUS (SEPSIS)
1000.0000 mL | Freq: Once | INTRAVENOUS | Status: AC
  Administered 2013-07-24: 1000 mL via INTRAVENOUS

## 2013-07-24 MED ORDER — PANTOPRAZOLE SODIUM 40 MG IV SOLR
40.0000 mg | Freq: Once | INTRAVENOUS | Status: AC
  Administered 2013-07-24: 40 mg via INTRAVENOUS
  Filled 2013-07-24: qty 40

## 2013-07-24 MED ORDER — PROMETHAZINE HCL 25 MG/ML IJ SOLN
12.5000 mg | Freq: Once | INTRAMUSCULAR | Status: AC
  Administered 2013-07-24: 12.5 mg via INTRAVENOUS
  Filled 2013-07-24: qty 1

## 2013-07-24 MED ORDER — DIPHENHYDRAMINE HCL 50 MG/ML IJ SOLN
25.0000 mg | Freq: Once | INTRAMUSCULAR | Status: AC
Start: 2013-07-24 — End: 2013-07-24
  Administered 2013-07-24: 25 mg via INTRAVENOUS
  Filled 2013-07-24: qty 1

## 2013-07-24 MED ORDER — ONDANSETRON HCL 4 MG/2ML IJ SOLN
4.0000 mg | Freq: Once | INTRAMUSCULAR | Status: AC
  Administered 2013-07-24: 4 mg via INTRAVENOUS
  Filled 2013-07-24: qty 2

## 2013-07-24 MED ORDER — MORPHINE SULFATE 4 MG/ML IJ SOLN
4.0000 mg | Freq: Once | INTRAMUSCULAR | Status: AC
  Administered 2013-07-24: 4 mg via INTRAVENOUS
  Filled 2013-07-24: qty 1

## 2013-07-24 MED ORDER — ONDANSETRON 4 MG PO TBDP
8.0000 mg | ORAL_TABLET | Freq: Once | ORAL | Status: AC
  Administered 2013-07-24: 8 mg via ORAL
  Filled 2013-07-24: qty 2

## 2013-07-24 NOTE — ED Provider Notes (Signed)
Complains of multiple episodes of vomiting onset this morning after starting Cipro and Flagyl for urinary tract infection bacterial vaginal infection. No treatment prior to coming here. Also complains of diffuse abdominal pain. Emesis has been blood tinged. No fever. No other associated symptoms.   Doug SouSam Jacubowitz, MD 07/24/13 2023

## 2013-07-24 NOTE — ED Notes (Signed)
IV fluids placed on pump at 56000ml/hr.

## 2013-07-24 NOTE — ED Notes (Signed)
Vomiting started this am , has not been able to keep anything down. Started on 4 antibiotics -- took them this morning and then ate-- has been throwing up ever since.

## 2013-07-24 NOTE — ED Notes (Signed)
Pt vomiting at triage, brownish tinged sputum,

## 2013-07-24 NOTE — ED Notes (Signed)
ED PA at bedside

## 2013-07-24 NOTE — ED Notes (Signed)
The patient is unable to give an urine specimen at this time. The tech has advised the patient to use the call light for assistance to the restroom. The tech has reported to the RN in charge. 

## 2013-07-24 NOTE — ED Notes (Signed)
Pt aware that urine is needed. Unable to urinate at this time.  

## 2013-07-24 NOTE — ED Provider Notes (Signed)
Medical screening examination/treatment/procedure(s) were conducted as a shared visit with non-physician practitioner(s) and myself.  I personally evaluated the patient during the encounter.   EKG Interpretation None       Doug SouSam Jacubowitz, MD 07/24/13 2157

## 2013-07-24 NOTE — ED Provider Notes (Signed)
CSN: [id number]     Arrival date & time 07/24/13  1659 History   First MD Initiated Contact with Patient 07/24/13 1805     Chief Complaint  Patient presents with  . Emesis  . Abdominal Pain     (Consider location/radiation/quality/duration/timing/severity/associated sxs/prior Treatment) HPI  33 year old female with history of type 1 diabetes, renal disorder, GERD, and gastroparesis presents for evaluation of nausea vomiting. Patient was seen by her PCP 3 days ago for evaluations of vaginal discharge. She was diagnosed with bacterial vaginosis and was prescribed metronidazole. Patient also was prescribed Cipro as well by her primary care Dr. She also is taking doxycycline for a wound infection on her left lower leg. She just started the antibiotic 2 days ago. Throughout today she feels nauseous, vomited persistently more than 10 times initially with food content but now with trace of blood. She reports whenever she vomits she can stop and was subsequently vomiting up blood. She denies lightheadedness or dizziness. She denies any significant chest pain shortness of breath cough. She endorses epigastric abdominal pain without back pain. She's unable to keep her medication down and currently feeling very weak and nauseous. No complaints of fever, chills, severe headache, neck stiffness, hives, rash. Denies any recent alcohol use or street drug use.  Past Medical History  Diagnosis Date  . Hyperlipidemia   . Hypertension   . Tobacco abuse   . Incarceration     Was in jail for selling drugs - out Oct 2010.  [name]. Renal disorder   . Type I diabetes mellitus     A1c > 11. Patient does not check BG or take insulin regularly.  Dx at age 89.  . Chronic bronchitis     "get it q yr" (08/21/2012)  . Exertional shortness of breath   . GERD (gastroesophageal reflux disease)   . WUJWJXBJ(478.2Headache(784.0)     "weekly" (08/21/2012)  . Arthritis     "legs and hands" (08/21/2012)   Past Surgical History  Procedure  Laterality Date  . Tubal ligation  2009   No family history on file. History  Substance Use Topics  . Smoking status: Current Every Day Smoker -- 0.33 packs/day for 14 years    Types: Cigarettes  . Smokeless tobacco: Never Used  . Alcohol Use: 0.0 oz/week     Comment: 08/21/2012 "might drink 1-2 glasses of liquor once/month"   OB History   Grav Para Term Preterm Abortions TAB SAB Ect Mult Living                 Review of Systems  All other systems reviewed and are negative.     Allergies  Review of patient's allergies indicates no known allergies.  Home Medications   Prior to Admission medications   Medication Sig Start Date End Date Taking? Authorizing Provider  ACCU-CHEK AVIVA PLUS test strip USE TO TEST BLOOD SUGAR UP TO 4 TIMES A DAY 03/06/13   Lonia SkinnerStephanie E Losq, MD  ACCU-CHEK SOFTCLIX LANCETS lancets USE TO TEST BLOOD SUGAR UP TO 4 TIMES A DAY 03/06/13   Lonia SkinnerStephanie E Losq, MD  ciprofloxacin (CIPRO) 250 MG tablet TAKE 1/2 TABLET BY MOUTH EVERY DAY FOR PREVENTION OF UTI 07/21/13   Uvaldo RisingKyle J Fletke, MD  ciprofloxacin-dexamethasone (CIPRODEX) otic suspension Place 4 drops into both ears 2 (two) times daily. 07/17/13 07/24/13  Lora PaulaJosalyn C Funches, MD  doxycycline (VIBRA-TABS) 100 MG tablet Take 1 tablet (100 mg total) by mouth 2 (two) times daily. 07/17/13   Josalyn  C Funches, MD  HYDROcodone-acetaminophen (NORCO/VICODIN) 5-325 MG per tablet Take 2 tablets by mouth every 6 (six) hours as needed for moderate pain. 01/08/13   Olivia Mackie, MD  insulin aspart (NOVOLOG) 100 UNIT/ML injection Inject 5-6 Units into the skin 3 (three) times daily as needed. For CBG over 180 09/25/12   Lonia Skinner, MD  insulin glargine (LANTUS) 100 UNIT/ML injection Inject 0.3 mLs (30 Units total) into the skin at bedtime. 09/25/12   Lonia Skinner, MD  Insulin Pen Needle (BD ULTRA-FINE PEN NEEDLES) 29G X 12.7MM MISC Inject 1 Device into the skin 4 (four) times daily -  before meals and at bedtime. use as directed  with insulin pens 01/17/13   Lonia Skinner, MD  LANTUS SOLOSTAR 100 UNIT/ML Solostar Pen INJECT 30 UNITS SUBCUTANEOUSLY AT BEDTIME 03/06/13   Lonia Skinner, MD  lisinopril (PRINIVIL,ZESTRIL) 10 MG tablet Take 1 tablet (10 mg total) by mouth daily. 09/25/12   Lonia Skinner, MD  metroNIDAZOLE (FLAGYL) 500 MG tablet Take 1 tablet (500 mg total) by mouth 2 (two) times daily. 07/22/13   Uvaldo Rising, MD  Multiple Vitamin (MULTIVITAMIN WITH MINERALS) TABS Take 1 tablet by mouth every other day.     Historical Provider, MD  norgestimate-ethinyl estradiol (ORTHO-CYCLEN,SPRINTEC,PREVIFEM) 0.25-35 MG-MCG tablet Take 1 tablet by mouth daily. 04/15/13   Josalyn C Funches, MD  pantoprazole (PROTONIX) 40 MG tablet TAKE 1 TABLET BY MOUTH EVERY DAY 03/06/13   Lonia Skinner, MD  polyethylene glycol powder (GLYCOLAX/MIRALAX) powder DISSOLVE 17 GRAMS (1 CAPFUL) INTO 8 OUNCES OF LIQUID AND DRINK TWICE A DAY AS NEEDED FOR CONSTIPATION 03/06/13   Lonia Skinner, MD  triamcinolone ointment (KENALOG) 0.5 % Apply topically 2 (two) times daily. 2 weeks max. Follow up needed for further refills. 05/29/13   Shelva Majestic, MD   BP 126/70  Pulse 79  Temp(Src) 97.6 F (36.4 C) (Oral)  Resp 18  SpO2 98% Physical Exam  Nursing note and vitals reviewed. Constitutional: She is oriented to person, place, and time. She appears well-developed and well-nourished. She appears distressed (patient actively vomiting, appears uncomfortable.).  HENT:  Head: Atraumatic.  Mouth/Throat: Oropharynx is clear and moist.  Eyes: Conjunctivae are normal.  Neck: Neck supple.  Cardiovascular: Normal rate and regular rhythm.   Pulmonary/Chest: Effort normal and breath sounds normal.  Abdominal: Soft. She exhibits no distension. There is tenderness (Epigastric tenderness without guarding or rebound tenderness.). There is no rebound and no guarding.  Neurological: She is alert and oriented to person, place, and time.  Skin: No rash noted.   Left lower leg with skin abrasion noted, well-healing, no evidence of infection.  Psychiatric: She has a normal mood and affect.    ED Course  Procedures (including critical care time)  6:41 PM Patient with history of type 1 diabetes presents for epigastric abdominal pain with nausea vomiting and unable to keep anything down. She is currently taking several different antibiotic for left leg infection that does not appear infected, and newly diagnosed BV. With his symptoms I am concerning for possible DKA. Workup initiated. IV fluid given along with symptomatically treatment.  7:58 PM Patient reports whenever she started to vomit she usually suffer from intractable vomiting requiring hospitalization and admission for management of her symptoms. The last time that she was admitted for this was last year. She does not know why she has these vomiting episodes. Last year she was admitted for similar complaint after starting on  abx.  It was thought that her sxs is related to either gastroparesis vs. Cyclical vomiting syndrome.    Suspect pt may need admission for intractable vomit, if sxs not improving.    8:13 PM i have consulted family medicine resident who will evaluate pt and determine dispo. Care discussed with oncoming provider who will continue to monitor pt and continue sxs treatment.  If no improvement will reconsult [facility] for admission.  Pt is hyperglycemic but no evidence of DKA.    Labs Review Labs Reviewed  CBC WITH DIFFERENTIAL - Abnormal; Notable for the following:    WBC 12.6 (*)    Neutrophils Relative % 83 (*)    Neutro Abs 10.4 (*)    Lymphocytes Relative 11 (*)    All other components within normal limits  COMPREHENSIVE METABOLIC PANEL - Abnormal; Notable for the following:    Glucose, Bld 261 (*)    All other components within normal limits  CBG MONITORING, ED - Abnormal; Notable for the following:    Glucose-Capillary 249 (*)    All other components within normal limits   LIPASE, BLOOD  URINALYSIS, ROUTINE W REFLEX MICROSCOPIC  POC OCCULT BLOOD, ED  POC URINE PREG, ED    Imaging Review No results found.   EKG Interpretation None      MDM   Final diagnoses:  Intractable cyclical vomiting with nausea    BP 136/63  Pulse 88  Temp(Src) 97.6 F (36.4 C) (Oral)  Resp 17  SpO2 100%  I have reviewed nursing notes and vital signs. I reviewed available ER/hospitalization records thought the EMR     Fayrene HelperBowie Vicent Febles, [place]PA-C 07/24/13 2018

## 2013-07-24 NOTE — ED Provider Notes (Signed)
2240 - Patient care assumed from Fayrene HelperBowie Tran, PA-C at shift change. Patient with history of gastroparesis vs cyclic vomiting syndrome. Patient has been monitored in the ED for 5.5 hours and dosed with multiple antiemetics without improvement in vomiting. Labs significant only for leukocytosis of 12.6. Have consulted with family medicine who has agreed to admit the patient for further evaluation and treatment. Family medicine to see and place admit orders.  Filed Vitals:   07/24/13 2115 07/24/13 2130 07/24/13 2200 07/24/13 2215  BP: 152/76 149/83 161/89 151/93  Pulse: 90 87 92 86  Temp:      TempSrc:      Resp: 21 21 23 17   SpO2: 100% 100% 100% 100%     Antony MaduraKelly Humes, PA-C 07/24/13 2242

## 2013-07-24 NOTE — ED Notes (Signed)
Family practice at bedside.

## 2013-07-24 NOTE — H&P (Signed)
Family Medicine Teaching [facility] Admission History and Physical Service Pager: [phone]  Patient name: [name] Medical record number: [id number] Date of birth: 01/14/1981 Age: 33 y.o. Gender: female  Primary Care Provider: Marena ChancyLOSQ, STEPHANIE, MD Consultants: None Code Status: Full  Chief Complaint: Vomiting  Assessment and Plan: [name] is a 33 y.o. female presenting with intractable nausea and vomiting. PMH is significant for cyclic vomiting syndrome, T1DM with gastroparesis, HTN, and tobacco use. No definite trigger noted for current symptoms, though labs, vitals, and general appearance reassuring / not suggestive of life-threatening pathology.  Intractable vomiting: Etiology likely gastroparesis (though no gastroscintigraphy formally diagnosing this in EPIC) and cyclic vomiting syndrome. Possibly triggered by antibiotics, though none of these medications were new to her. Not in DKA. No evidence of significant dehydration, s/p 2L NS boluses.  - NPO except sips and ice chips, will advance diet as tolerated - Reglan 20mg  IV q6h and phenergan 12.5mg  IV q4h prn intractable N/V - ECG to evaluate QT interval - Morphine 4mg  q2h prn for abdominal pain, will quickly wean this as it may contribute to nausea.  - CMP in AM - UDS  T1DM: Historically poor control (last Hb A1c 11.6 in Dec 2014). Ketonuria without acidosis on presentation due to fasting state.  - Will D/C lantus 30u, place on SSI with CBG q4h while NPO - Check lipid panel, last done in 2013. Though not technically in statin benefit group due to age <40, she has hypercholesterolemia on problem list and would likely benefit from statin. - Check Hb A1c  HTN: Normotensive in ED - No evidence of AKI, will continue lisinopril  Bacterial vaginosis: Recently diagnosed, began treatment yesterday.  - Will hold flagyl for now and discuss with team in AM. I feel this can likely be continued but I'd like to minimize  confounding etiological factors.  History of recurrent UTIs and perinephric abscess 2013:  - Urinalysis without evidence of infection - Hold ciprofloxacin while inpatient  Left lower extremity abrasion: without evidence of infection.  - Discontinue doxycycline - Wound care per RN  Contraceptive use: On combined OCP - Hold OCP given its possible contribution to nausea, will need to discuss alternative contraceptive use until able to restart.   Tobacco use: Recently decreased use.  - Defer discussion of cessation assistance to PCP  FEN/GI: D5 1/2NS @ 100cc/hr; NPO with sips and ice chips  Prophylaxis: Lovenox, protonix IV  Disposition: Admit for observation to med-surg by the FMTS under Dr. Leveda AnnaHensel  History of Present Illness: [name] is a 33 y.o. female presenting with intractable nausea and vomiting.   [name] reports having 10-15 episodes of emesis since early this morning with associated nausea and mild diffuse abdominal discomfort. Emesis was cloudy stomach contents evolving into bilious  and most recently streaked with red. It has become worse and has not responded to anti-emetics in the ED. She denies recent illness but began doxycycline for a leg abrasion as well as flagyl for bacterial vaginosis the preceding evening, in addition to daily cipro for UTI prophylaxis. She denies diarrhea, HA, chest pain, dyspnea, urinary symptoms. She endorses seeing occasional spots, which she attributes to diabetic retinopathy.   Takes lantus 30u qPM with fasting blood sugars 60-150s and blood sugars into the 200-300s during the day. She rarely takes SSI during the day, but occasionally takes 3-4 units with breakfast or dinner. Is able to afford medications.   She has history of cyclic vomiting episodes in the  past, most recently admitted for 5 days in July 2014. She can not think of a trigger to the episodes. She continues to smoke marijuana daily, most recently yesterday evening.    Review Of Systems: Per HPI Otherwise 12 point review of systems was performed and was unremarkable.  Patient Active Problem List   Diagnosis Date Noted  . Bacterial vaginosis 07/22/2013  . Vaginal discharge 07/21/2013  . Bilateral otitis externa 07/17/2013  . Skin abrasion 07/17/2013  . Contraception 04/16/2013  . Pain in finger of left hand 01/15/2013  . Unspecified constipation 08/28/2012  . Hypokalemia 04/19/2012  . Anemia 04/19/2012  . Essential hypertension, benign 04/19/2012  . Rash and nonspecific skin eruption 04/19/2012  . Intractable nausea and vomiting 03/11/2012  . Gastroparesis 03/11/2012  . Urinary tract infection 01/18/2012  . TRIGGER FINGER, RIGHT THUMB 05/24/2009  . TOBACCO USER 11/07/2008  . Diabetes mellitus type 1, uncontrolled, insulin dependent 04/02/2006  . HYPERCHOLESTEROLEMIA 03/29/2006   Past Medical History: Past Medical History  Diagnosis Date  . Hyperlipidemia   . Hypertension   . Tobacco abuse   . Incarceration     Was in jail for selling drugs - out Oct 2010.  [name] Renal disorder   . Type I diabetes mellitus     A1c > 11. Patient does not check BG or take insulin regularly.  Dx at age 71.  . Chronic bronchitis     "get it q yr" (08/21/2012)  . Exertional shortness of breath   . GERD (gastroesophageal reflux disease)   . ZOXWRUEA(540.9)     "weekly" (08/21/2012)  . Arthritis     "legs and hands" (08/21/2012)   Past Surgical History: Past Surgical History  Procedure Laterality Date  . Tubal ligation  2009   Social History: History  Substance Use Topics  . Smoking status: Current Every Day Smoker -- 0.33 packs/day for 14 years    Types: Cigarettes  . Smokeless tobacco: Never Used  . Alcohol Use: 0.0 oz/week     Comment: 08/21/2012 "might drink 1-2 glasses of liquor once/month"   Additional social history: Lives in house in [place] with two children, age 39 and 35. Has good family and friend support. Endorses rare alcohol as well as  smoking cigarettes and smoking marijuana daily.  Please also refer to relevant sections of EMR.  Family History: No family history on file. Allergies and Medications: No Known Allergies No current facility-administered medications on file prior to encounter.   Current Outpatient Prescriptions on File Prior to Encounter  Medication Sig Dispense Refill  . ciprofloxacin-dexamethasone (CIPRODEX) otic suspension Place 4 drops into both ears 2 (two) times daily.  7.5 mL  0  . doxycycline (VIBRA-TABS) 100 MG tablet Take 1 tablet (100 mg total) by mouth 2 (two) times daily.  20 tablet  0  . HYDROcodone-acetaminophen (NORCO/VICODIN) 5-325 MG per tablet Take 2 tablets by mouth every 6 (six) hours as needed for moderate pain.  30 tablet  0  . insulin aspart (NOVOLOG) 100 UNIT/ML injection Inject 5-6 Units into the skin 3 (three) times daily as needed. For CBG over 180  1 vial  10  . insulin glargine (LANTUS) 100 UNIT/ML injection Inject 0.3 mLs (30 Units total) into the skin at bedtime.  10 mL  6  . lisinopril (PRINIVIL,ZESTRIL) 10 MG tablet Take 1 tablet (10 mg total) by mouth daily.  90 tablet  3  . Multiple Vitamin (MULTIVITAMIN WITH MINERALS) TABS Take 1 tablet by mouth every other day.       [name]  norgestimate-ethinyl estradiol (ORTHO-CYCLEN,SPRINTEC,PREVIFEM) 0.25-35 MG-MCG tablet Take 1 tablet by mouth daily.  1 Package  11  . pantoprazole (PROTONIX) 40 MG tablet TAKE 1 TABLET BY MOUTH EVERY DAY  30 tablet  3  . triamcinolone ointment (KENALOG) 0.5 % Apply topically 2 (two) times daily. 2 weeks max. Follow up needed for further refills.  15 g  0  . metroNIDAZOLE (FLAGYL) 500 MG tablet Take 1 tablet (500 mg total) by mouth 2 (two) times daily.  14 tablet  0    Objective: BP 151/93  Pulse 86  Temp(Src) 97.6 F (36.4 C) (Oral)  Resp 17  SpO2 100% Exam: General: Non-toxic but uncomfortable 33 yo female shivering in bed in NAD HEENT: MMM, anicteric sclerae, L external ear canal occluded by brown  cerumen, R TM translucent gray without effusion or erythema, nares patent, oropharynx clear without blood. Cardiovascular: Regular rate, active precordium with soft, hemodynamic early SEM. DP pulses 2+ bilaterally. No supraclavicular crepitus.  Respiratory: Non-labored on ambient air, CTAB  Abdomen: Hypoactive BS, soft, mildly diffusely tender worst in peri-umbilical area, non-distended. No suprapubic tenderness. Extremities: Warm and well perfused without edema Skin: 3 superficial abrasions on left lower leg medially and posteriorly without drainage, surrounding erythema or warmth.  Neuro: Alert and oriented with normal speech, non-focal.  Labs and Imaging: CBC BMET   Recent Labs Lab 07/24/13 1918  WBC 12.6*  HGB 12.5  HCT 37.0  PLT 245    Recent Labs Lab 07/24/13 1918  NA 141  K 3.7  CL 98  CO2 23  BUN 12  CREATININE 0.72  GLUCOSE 261*  CALCIUM 9.7     FOBT: negative Preg Test, Ur NEGATIVE      Component Value Date/Time   COLORURINE YELLOW 07/24/2013 2022   APPEARANCEUR CLEAR 07/24/2013 2022   LABSPEC 1.026 07/24/2013 2022   PHURINE 6.0 07/24/2013 2022   GLUCOSEU >1000* 07/24/2013 2022   HGBUR NEGATIVE 07/24/2013 2022   HGBUR negative 02/24/2010 1357   BILIRUBINUR NEGATIVE 07/24/2013 2022   BILIRUBINUR NEG 04/15/2013 1600   KETONESUR 15* 07/24/2013 2022   PROTEINUR NEGATIVE 07/24/2013 2022   PROTEINUR NEG 04/15/2013 1600   UROBILINOGEN 0.2 07/24/2013 2022   UROBILINOGEN 0.2 04/15/2013 1600   NITRITE NEGATIVE 07/24/2013 2022   NITRITE NEG 04/15/2013 1600   LEUKOCYTESUR NEGATIVE 07/24/2013 2022   Hazeline Junkeryan Grunz, MD 07/24/2013, 10:40 PM PGY-1, [facility] FPTS Intern pager: [phone], text pages welcome  FPTS Upper-Level Resident Addendum  I have independently interviewed and examined the patient. I have discussed the above with Dr. Jarvis NewcomerGrunz and agree with his documentation as above. The above reflects his original note with my edits for  correction/additions/clarification in orange. Please see also any attending notes.   Bobbye Mortonhristopher M Street, MD PGY-2, [facility] FPTS Service pager: [phone] (text pages welcome through [facility])

## 2013-07-24 NOTE — ED Notes (Signed)
Pt vomited x1.  

## 2013-07-25 ENCOUNTER — Other Ambulatory Visit: Payer: Self-pay

## 2013-07-25 DIAGNOSIS — E113299 Type 2 diabetes mellitus with mild nonproliferative diabetic retinopathy without macular edema, unspecified eye: Secondary | ICD-10-CM | POA: Insufficient documentation

## 2013-07-25 LAB — COMPREHENSIVE METABOLIC PANEL
ALK PHOS: 50 U/L (ref 39–117)
ALT: 18 U/L (ref 0–35)
AST: 21 U/L (ref 0–37)
Albumin: 3.5 g/dL (ref 3.5–5.2)
BILIRUBIN TOTAL: 0.6 mg/dL (ref 0.3–1.2)
BUN: 11 mg/dL (ref 6–23)
CHLORIDE: 100 meq/L (ref 96–112)
CO2: 24 mEq/L (ref 19–32)
Calcium: 8.7 mg/dL (ref 8.4–10.5)
Creatinine, Ser: 0.73 mg/dL (ref 0.50–1.10)
GFR calc Af Amer: >90 mL/min (ref >90)
GFR calc non Af Amer: >90 mL/min (ref >90)
Glucose, Bld: 266 mg/dL — ABNORMAL HIGH (ref 70–99)
Potassium: 4.2 mEq/L (ref 3.7–5.3)
SODIUM: 139 meq/L (ref 137–147)
Total Protein: 6.6 g/dL (ref 6.0–8.3)

## 2013-07-25 LAB — LIPID PANEL
CHOL/HDL RATIO: 2.7 ratio
Cholesterol: 144 mg/dL (ref 0–200)
HDL: 53 mg/dL (ref >39)
LDL CALC: 86 mg/dL (ref 0–99)
TRIGLYCERIDES: 26 mg/dL (ref <150)
VLDL: 5 mg/dL (ref 0–40)

## 2013-07-25 LAB — GLUCOSE, CAPILLARY
GLUCOSE-CAPILLARY: 258 mg/dL — AB (ref 70–99)
Glucose-Capillary: 206 mg/dL — ABNORMAL HIGH (ref 70–99)
Glucose-Capillary: 173 mg/dL — ABNORMAL HIGH (ref 70–99)
Glucose-Capillary: 198 mg/dL — ABNORMAL HIGH (ref 70–99)

## 2013-07-25 LAB — HEMOGLOBIN A1C
HEMOGLOBIN A1C: 10 % — AB (ref <5.7)
Mean Plasma Glucose: 240 mg/dL — ABNORMAL HIGH (ref <117)

## 2013-07-25 MED ORDER — DEXTROSE-NACL 5-0.45 % IV SOLN
INTRAVENOUS | Status: DC
  Administered 2013-07-25: 02:00:00 via INTRAVENOUS

## 2013-07-25 MED ORDER — METOCLOPRAMIDE HCL 10 MG PO TABS: 10.0000 mg | ORAL_TABLET | Freq: Four times a day (QID) | ORAL | Status: DC | PRN

## 2013-07-25 MED ORDER — GLUCERNA SHAKE PO LIQD
237.0000 mL | Freq: Two times a day (BID) | ORAL | Status: DC
  Administered 2013-07-25: 237 mL via ORAL

## 2013-07-25 MED ORDER — MORPHINE SULFATE 4 MG/ML IJ SOLN: 4.0000 mg | INTRAMUSCULAR | Status: DC | PRN

## 2013-07-25 MED ORDER — PROMETHAZINE HCL 12.5 MG PO TABS: 12.5000 mg | ORAL_TABLET | Freq: Four times a day (QID) | ORAL | Status: DC | PRN

## 2013-07-25 MED ORDER — ONDANSETRON HCL 4 MG PO TABS: 8.0000 mg | ORAL_TABLET | Freq: Three times a day (TID) | ORAL | Status: DC | PRN

## 2013-07-25 MED ORDER — ENOXAPARIN SODIUM 40 MG/0.4ML ~~LOC~~ SOLN
40.0000 mg | Freq: Every day | SUBCUTANEOUS | Status: DC
  Administered 2013-07-25: 40 mg via SUBCUTANEOUS
  Filled 2013-07-25: qty 0.4

## 2013-07-25 MED ORDER — INSULIN ASPART 100 UNIT/ML ~~LOC~~ SOLN
0.0000 [IU] | SUBCUTANEOUS | Status: DC
  Administered 2013-07-25: 5 [IU] via SUBCUTANEOUS
  Administered 2013-07-25 (×2): 2 [IU] via SUBCUTANEOUS
  Administered 2013-07-25: 3 [IU] via SUBCUTANEOUS

## 2013-07-25 MED ORDER — ACETAMINOPHEN 325 MG PO TABS: 650.0000 mg | ORAL_TABLET | Freq: Four times a day (QID) | ORAL | Status: DC | PRN

## 2013-07-25 MED ORDER — LISINOPRIL 10 MG PO TABS
10.0000 mg | ORAL_TABLET | Freq: Every day | ORAL | Status: DC
  Filled 2013-07-25: qty 1

## 2013-07-25 MED ORDER — INSULIN GLARGINE 100 UNIT/ML ~~LOC~~ SOLN
10.0000 [IU] | Freq: Every day | SUBCUTANEOUS | Status: DC
  Filled 2013-07-25: qty 0.1

## 2013-07-25 MED ORDER — ONDANSETRON 4 MG PO TBDP: 4.0000 mg | ORAL_TABLET | Freq: Three times a day (TID) | ORAL | Status: DC | PRN

## 2013-07-25 MED ORDER — PROMETHAZINE HCL 25 MG/ML IJ SOLN: 12.5000 mg | INTRAMUSCULAR | Status: DC | PRN

## 2013-07-25 MED ORDER — DEXTROSE 5 % IV SOLN
20.0000 mg | Freq: Four times a day (QID) | INTRAVENOUS | Status: DC
  Administered 2013-07-25 (×2): 20 mg via INTRAVENOUS
  Filled 2013-07-25 (×4): qty 4

## 2013-07-25 MED ORDER — INSULIN GLARGINE 100 UNIT/ML ~~LOC~~ SOLN
15.0000 [IU] | Freq: Every day | SUBCUTANEOUS | Status: DC
  Administered 2013-07-25: 15 [IU] via SUBCUTANEOUS
  Filled 2013-07-25: qty 0.15

## 2013-07-25 NOTE — Progress Notes (Signed)
INITIAL NUTRITION ASSESSMENT  DOCUMENTATION CODES Per approved criteria  -Not Applicable   INTERVENTION: Provide Glucerna Shakes BID Provided and reviewed "Gastroparesis Nutrition Therapy" handout from the Academy of [employer] RD to continue to monitor  NUTRITION DIAGNOSIS: Inadequate oral intake related to nausea/vomiting as evidenced by 11% weight loss in less than 7 months.   Goal: Pt to meet >/= 90% of their estimated nutrition needs  Monitor:  PO intake, weight rend, labs  Reason for Assessment: Malnutrition Screening Tool  33 y.o. female  Admitting Dx: Intractable nausea and vomiting  ASSESSMENT: 33 y.o. female presenting with intractable nausea and vomiting. PMH is significant for cyclic vomiting syndrome, T1DM with gastroparesis, HTN, and tobacco use.  Pt states she has had nausea/vomiting for the past 2 days but, she is feeling better today and she tolerated 100% of her clear liquid breakfast tray this morning. She states that about 6 months ago she weighed her usual body weight of 155-160 lbs and she has been losing weight gradually. She reports that she usually eats 3 meals daily; she is unsure of the cause of her weight loss but, thinks perhaps it is related to occasional episodes of nausea and gastroparesis.  RD provided and reviewed "Gastroparesis Nutrition Therapy" handout from the Academy of [employer]. Pt states that her blood glucose levels have been under control; she denies any additional nutrition related questions/needs at this time.  Pt interested in receiving nutritional supplements until diet advances and PO intake returns to normal.  Weight history shows pt lost 11% of her body weight in less than 7 months with recent 11 lb weight gain.  Labs reviewed. Elevated glucose. Lipid panel WNL, HgbA1C in process.   Height: Ht Readings from Last 1 Encounters:  07/25/13 5\' 8"  (1.727 m)    Weight: Wt Readings from Last 1 Encounters:   07/25/13 149 lb 0.5 oz (67.6 kg)    Ideal Body Weight: 140 lbs  % Ideal Body Weight: 106%  Wt Readings from Last 10 Encounters:  07/25/13 149 lb 0.5 oz (67.6 kg)  07/21/13 138 lb (62.596 kg)  07/17/13 142 lb 12.8 oz (64.774 kg)  04/15/13 154 lb (69.854 kg)  01/14/13 156 lb (70.761 kg)  01/08/13 155 lb (70.308 kg)  09/24/12 149 lb 3.2 oz (67.677 kg)  08/28/12 151 lb (68.493 kg)  08/23/12 136 lb 3.9 oz (61.8 kg)  07/17/12 151 lb 3.2 oz (68.584 kg)    Usual Body Weight: 155 lbs  % Usual Body Weight: 96%  BMI:  Body mass index is 22.67 kg/(m^2).  Estimated Nutritional Needs: Kcal: 1800-2000 Protein: 70-80 grams Fluid: 1.8-2 L/day  Skin: intact  Diet Order: Carb Control  EDUCATION NEEDS: -Education needs addressed   Intake/Output Summary (Last 24 hours) at 07/25/13 1018 Last data filed at 07/25/13 0947  Gross per 24 hour  Intake   1123 ml  Output    300 ml  Net    823 ml    Last BM: 6/25  Labs:   Recent Labs Lab 07/24/13 1918 07/25/13 0250  NA 141 139  K 3.7 4.2  CL 98 100  CO2 23 24  BUN 12 11  CREATININE 0.72 0.73  CALCIUM 9.7 8.7  GLUCOSE 261* 266*    CBG (last 3)   Recent Labs  07/25/13 0046 07/25/13 0427 07/25/13 0757  GLUCAP 258* 206* 173*    Scheduled Meds: . enoxaparin (LOVENOX) injection  40 mg Subcutaneous Daily  . insulin aspart  0-9 Units  Subcutaneous 6 times per day  . insulin glargine  15 Units Subcutaneous Daily    Continuous Infusions:   Past Medical History  Diagnosis Date  . Hyperlipidemia   . Hypertension   . Tobacco abuse   . Incarceration     Was in jail for selling drugs - out Oct 2010.  [name]. Renal disorder   . Type I diabetes mellitus     A1c > 11. Patient does not check BG or take insulin regularly.  Dx at age 89.  . Chronic bronchitis     "get it q yr" (08/21/2012)  . Exertional shortness of breath   . GERD (gastroesophageal reflux disease)   . ZOXWRUEA(540.9Headache(784.0)     "weekly" (08/21/2012)  . Arthritis      "legs and hands" (08/21/2012)    Past Surgical History  Procedure Laterality Date  . Tubal ligation  2009    Ian Malkineanne Barnett RD, LDN Inpatient Clinical Dietitian Pager: [phone] After Hours Pager: [phone]

## 2013-07-25 NOTE — Discharge Summary (Signed)
Family Medicine Teaching [facility] Discharge Summary  Patient name: [name] Medical record number: [id number] Date of birth: 05-24-1980 Age: 33 y.o. Gender: female Date of Admission: 07/24/2013  Date of Discharge: 07/25/13 Admitting Physician: Sanjuana Letters, MD  Primary Care Provider: Marena Chancy, MD Consultants: None  Indication for Hospitalization: Intractable Vomiting  Discharge Diagnoses/Problem List:  Intractable Vomiting, in setting of cyclical vomiting syndrome - Resolved DM, Type 1, poorly controlled Gastroparesis, in setting of DM HTN Bacterial vaginosis, diagnosed prior to admission H/o recurrent UTIs, with hx perinephric abscess (2013) Lower extremity superficial abrasion Tobacco abuse  Disposition: Home  Discharge Condition: Stable  Discharge Exam: General: well-appearing, NAD  Cardiovascular: RRR, no murmurs  Respiratory: CTAB  Abdomen: soft, NTND, +active BS  Extremities: moves all, non-tender , no edema  Skin: 3 superficial abrasions on left lower leg medially and posteriorly without drainage, surrounding erythema or warmth.  Neuro: awake, alert, non-focal.  Brief Hospital Course:  [name] is a 33 y.o. female who presented with intractable nausea and vomiting. PMH is significant for cyclic vomiting syndrome, T1DM with gastroparesis, HTN, and tobacco use. Recently started on outpatient antibiotic course with Doxycycline (for LLE superficial skin abrasions), Diflucan, and Flagyl (BV), suspected trigger for current episode, consistent with previous cyclical vomiting flares. Presented with 10-15 x emesis (normal, inc bilious, small red streaks), associated with mild abd pain, otherwise afebrile and with unremarkable labs, no evidence of DKA, with noted dehydration. Admitted for IVF rehydration, IV anti-emetics (Reglan, Phenergan, Zofran), and pain control (Morphine).  During hospitalization, patient demonstrated dramatic improvement  within 24 hours, resolved nausea and vomiting in the morning, and able to tolerate clear liquids well, no longer requiring IV anti-emetics or pain medicine. Advanced diet to carb modified, and discharged to home with rx for Zofran and Phenergan.   Issues for Follow Up:   1. Cyclical Vomiting - Suspected Doxycycline as trigger for current episode (discontinued). Continued Flagyl course. Likely marijuana plays a role as trigger. Complicated by DM Gastroparesis.  2. OCP - Possible contribution to cyclical vomiting as well. Need birth control while on ACEi. Recommend LARC  3. Statin - consider starting statin despite good lipid panel, given significant risk factors (AA, DM, HTN, Tobacco) (if consider age 75, ASCVD risk calculator at 8-9% 10 yr risk)  Significant Procedures: none  Significant Labs and Imaging:   Recent Labs Lab 07/24/13 1918  WBC 12.6*  HGB 12.5  HCT 37.0  PLT 245    Recent Labs Lab 07/24/13 1918 07/25/13 0250  NA 141 139  K 3.7 4.2  CL 98 100  CO2 23 24  GLUCOSE 261* 266*  BUN 12 11  CREATININE 0.72 0.73  CALCIUM 9.7 8.7  ALKPHOS 61 50  AST 26 21  ALT 22 18  ALBUMIN 4.6 3.5    Upreg (negative)  HgbA1c (10.0)  Lipid Panel - nml   Results/Tests Pending at Time of Discharge: none  Discharge Medications:    Medication List    STOP taking these medications       ciprofloxacin-dexamethasone otic suspension  Commonly known as:  CIPRODEX     doxycycline 100 MG tablet  Commonly known as:  VIBRA-TABS     triamcinolone ointment 0.5 %  Commonly known as:  KENALOG      TAKE these medications       HYDROcodone-acetaminophen 5-325 MG per tablet  Commonly known as:  NORCO/VICODIN  Take 2 tablets by mouth every 6 (six) hours as needed for  moderate pain.     insulin aspart 100 UNIT/ML injection  Commonly known as:  novoLOG  Inject 5-6 Units into the skin 3 (three) times daily as needed. For CBG over 180     insulin glargine 100 UNIT/ML injection   Commonly known as:  LANTUS  Inject 0.3 mLs (30 Units total) into the skin at bedtime.     lisinopril 10 MG tablet  Commonly known as:  PRINIVIL,ZESTRIL  Take 1 tablet (10 mg total) by mouth daily.     metroNIDAZOLE 500 MG tablet  Commonly known as:  FLAGYL  Take 1 tablet (500 mg total) by mouth 2 (two) times daily.     multivitamin with minerals Tabs tablet  Take 1 tablet by mouth every other day.     norgestimate-ethinyl estradiol 0.25-35 MG-MCG tablet  Commonly known as:  ORTHO-CYCLEN,SPRINTEC,PREVIFEM  Take 1 tablet by mouth daily.     ondansetron 4 MG disintegrating tablet  Commonly known as:  ZOFRAN ODT  Take 1 tablet (4 mg total) by mouth every 8 (eight) hours as needed for nausea or vomiting.     pantoprazole 40 MG tablet  Commonly known as:  PROTONIX  TAKE 1 TABLET BY MOUTH EVERY DAY     polyethylene glycol packet  Commonly known as:  MIRALAX / GLYCOLAX  Take 17 g by mouth daily as needed for mild constipation.     promethazine 12.5 MG tablet  Commonly known as:  PHENERGAN  Take 1 tablet (12.5 mg total) by mouth every 6 (six) hours as needed for nausea or vomiting.        Discharge Instructions: Please refer to Patient Instructions section of EMR for full details.  Patient was counseled important signs and symptoms that should prompt return to medical care, changes in medications, dietary instructions, activity restrictions, and follow up appointments.   Follow-Up Appointments: Follow-up Information   Follow up with Beverely Low, MD On 07/31/2013. (at 3:00pm for hospital follow-up)    Specialty:  [facility] Medicine   Contact information:   [address] [place] [address] [phone]       Saralyn Pilar, DO 07/25/2013, 11:03 PM PGY-1, [facility]

## 2013-07-25 NOTE — Progress Notes (Signed)
Utilization Review Completed.[name] T6/26/2015  

## 2013-07-25 NOTE — Progress Notes (Signed)
Pt discharge instructions given, pt verbalized understanding.  VSS. Denies pain. Pt left floor ambulating accompanied by staff and family. 

## 2013-07-25 NOTE — Progress Notes (Signed)
NURSING PROGRESS NOTE  [facility] Crosbyatrice R Peeters [id number] Admission Data: 07/25/2013 2:27 AM Attending Provider: Sanjuana LettersWilliam Arthur Hensel, MD YNW:GNFAPCP:LOSQ, Judeth CornfieldSTEPHANIE, MD Code Status: Full  Providence Crosbyatrice R Rudman is a 33 y.o. female patient admitted from ED:  -No acute distress noted.  -No complaints of shortness of breath.  -No complaints of chest pain.   Cardiac Monitoring: No order for telemetry monitoring  Blood pressure 142/90, pulse 102, temperature 97.9 F (36.6 C), temperature source Oral, resp. rate 16, height 5\' 8"  (1.727 m), weight 67.6 kg (149 lb 0.5 oz), SpO2 100.00%.   IV Fluids:  IV in place, occlusive dsg intact without redness, IV cath hand left, condition patent and no redness D5W/0.45 NaCl.   Allergies:  Review of patient's allergies indicates no known allergies.  Past Medical History:   has a past medical history of Hyperlipidemia; Hypertension; Tobacco abuse; Incarceration; Renal disorder; Type I diabetes mellitus; Chronic bronchitis; Exertional shortness of breath; GERD (gastroesophageal reflux disease); Headache(784.0); and Arthritis.  Past Surgical History:   has past surgical history that includes Tubal ligation (2009).  Social History:   reports that she has been smoking Cigarettes.  She has a 4.62 pack-year smoking history. She has never used smokeless tobacco. She reports that she drinks alcohol. She reports that she uses illicit drugs (Marijuana).  Skin: healing abrasion on left lower leg due to a fall. Otherwise skin intact.   Patient/Family orientated to room. Information packet given to patient/family. Admission inpatient armband information verified with patient/family to include name and date of birth and placed on patient arm. Side rails up x 2, fall assessment and education completed with patient/family. Patient/family able to verbalize understanding of risk associated with falls and verbalized understanding to call for assistance before getting out of bed. Call light  within reach. Patient/family able to voice and demonstrate understanding of unit orientation instructions.    Will continue to evaluate and treat per MD orders.

## 2013-07-25 NOTE — H&P (Signed)
Seen and examined.  Chart reviewed.  Discussed with Dr. Casper HarrisonStreet.  Agree with his documentation and management.  Briefly, 33 yo female well known to me with Type 1 DM and cyclic vomiting syndrome.  She is vomiting again with the only possible trigger being the multiple antibiotics (cipro, flagyl, doxy) taken for different indications.  DM control is her usual poor, but not horrible.  Dehydration is mild.  She is feeling better this morning and would like to try clears.   Anticipate slowly advancing diet and DC perhaps in the next 24 hours.  I see no reason to pursue a diagnostic work up at this point.

## 2013-07-25 NOTE — Progress Notes (Signed)
Family Medicine Teaching Service Daily Progress Note Intern Pager: [phone]  Patient name: [name] Medical record number: [id number] Date of birth: July 30, 1980 Age: 33 y.o. Gender: female  Primary Care Taralyn Ferraiolo: Marena Chancy, MD Consultants: none Code Status: Full  Pt Overview and Major Events to Date:   Assessment and Plan: [name] is a 33 y.o. female presenting with intractable nausea and vomiting. PMH is significant for cyclic vomiting syndrome, T1DM with gastroparesis, HTN, and tobacco use. No definite trigger noted for current symptoms, though labs, vitals, and general appearance reassuring / not suggestive of life-threatening pathology.  Intractable vomiting - Resolved - Etiology likely gastroparesis (though no gastroscintigraphy formally diagnosing this in EPIC) and cyclic vomiting syndrome. Possibly triggered by antibiotics, though none of these medications were new to her. Not in DKA. No evidence of significant dehydration, s/p 2L NS boluses.  - Currently with stable vitals, overall improved with last vomiting episode last night, no nausea today - Tolerated clear liquid diet well >> advanced to solid carb modified diet today - Transition to PO anti-emetics - Zofran, Reglan - No longer requiring IV morphine for pain  T1DM:  Historically poor control (last Hb A1c 11.6 in Dec 2014). Ketonuria without acidosis on presentation due to fasting state.  - Resume Lantus 15u today, then return to home 30u Lantus start tomorrow after discharge - Lipid panel nml - A1c (pending)  HTN: Normotensive in ED  - No evidence of AKI, will continue lisinopril  Bacterial vaginosis: Recently diagnosed, began treatment yesterday.  - Continue Flagyl on discharge to complete course  History of recurrent UTIs and perinephric abscess 2013:  - Urinalysis without evidence of infection  - Resume Cipro on discharge  Left lower extremity abrasion: without evidence of infection.  -  Discontinue doxycycline - do not resume on discharge - Wound care per RN  Contraceptive use: On combined OCP  - Hold OCP given its possible contribution to nausea, will need to discuss alternative contraceptive use until able to restart.  Tobacco use: Recently decreased use.  - Defer discussion of cessation assistance to PCP  FEN/GI: SLIV (dc IVF) / carb modified diet (advanced from clears)  Prophylaxis: Lovenox, protonix IV  Disposition: Admit for observation to med-surg by the FMTS, mostly resolved vomiting, plan for discharge to home today  Subjective:  Feels better this morning. Denies any nausea. Last vomiting last night small amount. Denies abdominal pain, fever/chills.  Objective: Temp:  [97.6 F (36.4 C)-98.6 F (37 C)] 98.6 F (37 C) (06/26 0948) Pulse Rate:  [73-120] 82 (06/26 0948) Resp:  [14-23] 18 (06/26 0948) BP: (94-161)/(50-104) 94/50 mmHg (06/26 0948) SpO2:  [97 %-100 %] 97 % (06/26 0948) Weight:  [149 lb 0.5 oz (67.6 kg)] 149 lb 0.5 oz (67.6 kg) (06/26 0028) Physical Exam: General: sitting up in bed, pleasant, NAD Cardiovascular: RRR, no murmurs Respiratory: CTAB Abdomen: soft, NTND, +active BS Extremities: moves all, non-tender , no edema Skin: 3 superficial abrasions on left lower leg medially and posteriorly without drainage, surrounding erythema or warmth.  Neuro: Alert and oriented with normal speech, non-focal.   Laboratory:  Recent Labs Lab 07/24/13 1918  WBC 12.6*  HGB 12.5  HCT 37.0  PLT 245    Recent Labs Lab 07/24/13 1918 07/25/13 0250  NA 141 139  K 3.7 4.2  CL 98 100  CO2 23 24  BUN 12 11  CREATININE 0.72 0.73  CALCIUM 9.7 8.7  PROT 8.1 6.6  BILITOT 0.5 0.6  ALKPHOS 61  50  ALT 22 18  AST 26 21  GLUCOSE 261* 266*   Urinalysis    Component Value Date/Time   COLORURINE YELLOW 07/24/2013 2022   APPEARANCEUR CLEAR 07/24/2013 2022   LABSPEC 1.026 07/24/2013 2022   PHURINE 6.0 07/24/2013 2022   GLUCOSEU >1000* 07/24/2013 2022    HGBUR NEGATIVE 07/24/2013 2022   HGBUR negative 02/24/2010 1357   BILIRUBINUR NEGATIVE 07/24/2013 2022   BILIRUBINUR NEG 04/15/2013 1600   KETONESUR 15* 07/24/2013 2022   PROTEINUR NEGATIVE 07/24/2013 2022   PROTEINUR NEG 04/15/2013 1600   UROBILINOGEN 0.2 07/24/2013 2022   UROBILINOGEN 0.2 04/15/2013 1600   NITRITE NEGATIVE 07/24/2013 2022   NITRITE NEG 04/15/2013 1600   LEUKOCYTESUR NEGATIVE 07/24/2013 2022   Upreg (negative) HgbA1c (pending) Lipid Panel - nml UDS  Imaging/Diagnostic Tests:  EKG  Saralyn Pilar, DO 07/25/2013, 12:33 PM PGY-1, [place] [facility] FPTS Intern pager: [phone], text pages welcome

## 2013-07-25 NOTE — Discharge Instructions (Signed)
You were hospitalized with intractable vomiting, we believe that it may have been triggered by the antibiotics (most likely Doxycycline). You responded very well to the anti-nausea medicines and IV fluids. - We recommend discontinuing the Doxycycline at this time, as the skin wound on your leg appears to be healing. If it seems to get worse when you return to the clinic, we can try a different antibiotic. - Sent prescription for Zofran dissolving tablets for your nausea, and short course of Phenergan to use if needed, you may use these intermittently (not at the same time) but every 3-4 hours as needed, if it is acutely worse - For your Diabetes, make sure that you resume your Lantus 30u daily starting tomorrow  Also, please make sure that you continue to take your Birth Control Medication - this is especially important while you are taking the Lisinopril (which can cause birth defects).  - If your vomiting returns and it is not responding to anti-nausea medicines, and you are no longer tolerating liquids, and/or have a fever, abdominal pain, then please call our clinic to be seen sooner, otherwise return to the Emergency Department for immediate medical attention  Cyclic Vomiting Syndrome Cyclic vomiting syndrome is a benign condition in which patients experience bouts or cycles of severe nausea and vomiting that last for hours or even days. The bouts of nausea and vomiting alternate with longer periods of no symptoms and generally good health. Cyclic vomiting syndrome occurs mostly in children, but can affect adults. CAUSES  [facility] has no known cause. Each episode is typically similar to the previous ones. The episodes tend to:   Start at about the same time of day.  Last the same length of time.  Present the same symptoms at the same level of intensity. Cyclic vomiting syndrome can begin at any age in children and adults. Cyclic vomiting syndrome usually starts between the ages of 3 and 7 years.  In adults, episodes tend to occur less often than they do in children, but they last longer. Furthermore, the events or situations that trigger episodes in adults cannot always be pinpointed as easily as they can in children. There are 4 phases of cyclic vomiting syndrome: 1. Prodrome. The prodrome phase signals that an episode of nausea and vomiting is about to begin. This phase can last from just a few minutes to several hours. This phase is often marked by belly (abdominal) pain. Sometimes taking medicine early in the prodrome phase can stop an episode in progress. However, sometimes there is no warning. A person may simply wake up in the middle of the night or early morning and begin vomiting. 2. Episode. The episode phase consists of:  Severe vomiting.  Nausea.  Gagging (retching). 3. Recovery. The recovery phase begins when the nausea and vomiting stop. Healthy color, appetite, and energy return. 4. Symptom-free interval. The symptom-free interval phase is the period between episodes when no symptoms are present. TRIGGERS Episodes can be triggered by an infection or event. Examples of triggers include:  Infections.  Colds, allergies, sinus problems, and the flu.  Eating certain foods such as chocolate or cheese.  Foods with monosodium glutamate (MSG) or preservatives.  Fast foods.  Pre-packaged foods.  Foods with low nutritional value (junk foods).  Overeating.  Eating just before going to bed.  Hot weather.  Dehydration.  Not enough sleep or poor sleep quality.  Physical exhaustion.  Menstruation.  Motion sickness.  Emotional stress (school or home difficulties).  Excitement or stress.  SYMPTOMS  The main symptoms of cyclic vomiting syndrome are:  Severe vomiting.  Nausea.  Gagging (retching). Episodes usually begin at night or the first thing in the morning. Episodes may include vomiting or retching up to 5 or 6 times an hour during the worst of the  episode. Episodes usually last anywhere from 1 to 4 days. Episodes can last for up to 10 days. Other symptoms include:  Paleness.  Exhaustion.  Listlessness.  Abdominal pain.  Loose stools or diarrhea. Sometimes the nausea and vomiting are so severe that a person appears to be almost unconscious. Sensitivity to light, headache, fever, dizziness, may also accompany an episode. In addition, the vomiting may cause drooling and excessive thirst. Drinking water usually leads to more vomiting, though the water can dilute the acid in the vomit, making the episode a little less painful. Continuous vomiting can lead to dehydration, which means that the body has lost excessive water and salts. DIAGNOSIS  Cyclic vomiting syndrome is hard to diagnose because there are no clear tests to identify it. A caregiver must diagnose cyclic vomiting syndrome by looking at symptoms and medical history. A caregiver must exclude more common diseases or disorders that can also cause nausea and vomiting. Also, diagnosis takes time because caregivers need to identify a pattern or cycle to the vomiting. TREATMENT  Cyclic vomiting syndrome cannot be cured. Treatment varies, but people with cyclic vomiting syndrome should get plenty of rest and sleep and take medications that prevent, stop, or lessen the vomiting episodes and other symptoms. People whose episodes are frequent and long-lasting may be treated during the symptom-free intervals in an effort to prevent or ease future episodes. The symptom-free phase is a good time to eliminate anything known to trigger an episode. For example, if episodes are brought on by stress or excitement, this period is the time to find ways to reduce stress and stay calm. If sinus problems or allergies cause episodes, those conditions should be treated. The triggers listed above should be avoided or prevented. Because of the similarities between migraine and cyclic vomiting syndrome, caregivers  treat some people with severe cyclic vomiting syndrome with drugs that are also used for migraine headaches. The drugs are designed to:  Prevent episodes.  Reduce their frequency.  Lessen their severity. HOME CARE INSTRUCTIONS Once a vomiting episode begins, treatment is supportive. It helps to stay in bed and sleep in a dark, quiet room. Severe nausea and vomiting may require hospitalization and intravenous (IV) fluids to prevent dehydration. Relaxing medications (sedatives) may help if the nausea continues. Sometimes, during the prodrome phase, it is possible to stop an episode from happening altogether. Only take over-the-counter or prescription medicines for pain, discomfort or fever as directed by your caregiver. Do not give aspirin to children. During the recovery phase, drinking water and replacing lost electrolytes (salts in the blood) are very important. Electrolytes are salts that the body needs to function well and stay healthy. Symptoms during the recovery phase can vary. Some people find that their appetites return to normal immediately, while others need to begin by drinking clear liquids and then move slowly to solid food. RELATED COMPLICATIONS The severe vomiting that defines cyclic vomiting syndrome is a risk factor for several complications:  Dehydration--Vomiting causes the body to lose water quickly.  Electrolyte imbalance--Vomiting also causes the body to lose the important salts it needs to keep working properly.  Peptic esophagitis--The tube that connects the mouth to the stomach (esophagus) becomes injured from the  stomach acid that comes up with the vomit.  Hematemesis--The esophagus becomes irritated and bleeds, so blood mixes with the vomit.  Mallory-Weiss tear--The lower end of the esophagus may tear open or the stomach may bruise from vomiting or retching.  Tooth decay--The acid in the vomit can hurt the teeth by corroding the tooth enamel. SEEK MEDICAL CARE  IF: You have questions or problems. Document Released: 03/27/2001 Document Revised: 04/10/2011 Document Reviewed: 04/25/2010 [facility] Patient Information 2015 [employer] [place]. This information is not intended to replace advice given to you by your health care provider. Make sure you discuss any questions you have with your health care provider.

## 2013-07-25 NOTE — ED Provider Notes (Signed)
Medical screening examination/treatment/procedure(s) were performed by non-physician practitioner and as supervising physician I was immediately available for consultation/collaboration.   EKG Interpretation None       Nathan R. Pickering, MD 07/25/13 0026 

## 2013-07-25 NOTE — Progress Notes (Signed)
FMTS ATTENDING  NOTE Nicolette BangKehinde Eniola,MD II have discussed this patient with Dr Leveda AnnaHensel the admitting attending and the resident. I agree with their findings, assessment and care plan.

## 2013-07-26 NOTE — Discharge Summary (Signed)
FMTS ATTENDING  NOTE Nicolette BangKehinde Eniola,MD  I have discussed this patient with Dr Leveda AnnaHensel, the admitting attending and the resident. I agree with the resident's findings, assessment and care plan.

## 2013-07-31 ENCOUNTER — Inpatient Hospital Stay: Payer: Medicaid Other | Admitting: Family Medicine

## 2013-08-06 ENCOUNTER — Ambulatory Visit (INDEPENDENT_AMBULATORY_CARE_PROVIDER_SITE_OTHER): Payer: Medicaid Other | Admitting: *Deleted

## 2013-08-06 ENCOUNTER — Telehealth: Payer: Self-pay | Admitting: Family Medicine

## 2013-08-06 DIAGNOSIS — Z111 Encounter for screening for respiratory tuberculosis: Secondary | ICD-10-CM

## 2013-08-06 NOTE — Telephone Encounter (Signed)
Patient is unhappy that her MD was changed. Advised patient that Dr. Gwenlyn SaranLosq had graduated and that she had a new MD. She states that we needed to stop changing her MD all the time or she will find another place to go. Advised patient that our office is a residency and that her doctor will change periodically. Patient state that she would like to be switch to Dr. Leveda AnnaHensel since he will always be here. Advised patient that she would have to fill out the change PCP form before this step can be taken. Patient very irate and ask to speak to supervisor. I called Jeannette's office and she currently in meeting. Patient would like to speak to supervisor. Please call patient at [phone].

## 2013-08-06 NOTE — Telephone Encounter (Signed)
Returned call to patient.  States she wants to switch to Dr. Leveda AnnaHensel since he is always here and doesn't want to keep switching to different doctors every couple of years.  Explained that we are a residency program and our providers are only here for 3 years before they graduate.  Also explained that she will have Dr. Richarda BladeAdamo for 2 years through July 2017 before she is switched to a new provider.  Patient also informed that she can check with other offices and see if they are taking new patients if she doesn't want to stay with the residency program.  Patient states she will stay with Dr. Richarda BladeAdamo and call me back if she has any additional problems or concerns.  Altamese Dilling~Jeannette Richardson, BSN, RN-BC

## 2013-08-08 ENCOUNTER — Encounter: Payer: Self-pay | Admitting: *Deleted

## 2013-08-08 ENCOUNTER — Ambulatory Visit (INDEPENDENT_AMBULATORY_CARE_PROVIDER_SITE_OTHER): Payer: Medicaid Other | Admitting: *Deleted

## 2013-08-08 DIAGNOSIS — Z111 Encounter for screening for respiratory tuberculosis: Secondary | ICD-10-CM

## 2013-08-08 LAB — TB SKIN TEST
INDURATION: 0 mm
TB SKIN TEST: NEGATIVE

## 2013-09-04 ENCOUNTER — Ambulatory Visit (INDEPENDENT_AMBULATORY_CARE_PROVIDER_SITE_OTHER): Payer: Medicaid Other | Admitting: Family Medicine

## 2013-09-04 ENCOUNTER — Ambulatory Visit: Payer: Medicaid Other | Admitting: Family Medicine

## 2013-09-04 ENCOUNTER — Encounter: Payer: Self-pay | Admitting: Family Medicine

## 2013-09-04 ENCOUNTER — Other Ambulatory Visit (HOSPITAL_COMMUNITY)
Admission: RE | Admit: 2013-09-04 | Discharge: 2013-09-04 | Disposition: A | Discharge status: Home / Self Care | Payer: Medicaid Other | Source: Ambulatory Visit | Attending: Family Medicine | Admitting: Family Medicine

## 2013-09-04 VITALS — BP 148/95 | HR 75 | Temp 98.0°F | Wt 145.0 lb

## 2013-09-04 DIAGNOSIS — Z113 Encounter for screening for infections with a predominantly sexual mode of transmission: Secondary | ICD-10-CM | POA: Diagnosis not present

## 2013-09-04 DIAGNOSIS — N898 Other specified noninflammatory disorders of vagina: Secondary | ICD-10-CM

## 2013-09-04 DIAGNOSIS — B86 Scabies: Secondary | ICD-10-CM

## 2013-09-04 LAB — POCT WET PREP (WET MOUNT): Clue Cells Wet Prep Whiff POC: POSITIVE

## 2013-09-04 MED ORDER — METRONIDAZOLE 500 MG PO TABS: 500.0000 mg | ORAL_TABLET | Freq: Two times a day (BID) | ORAL | Status: DC

## 2013-09-04 MED ORDER — PERMETHRIN 5 % EX CREA: 1.0000 "application " | TOPICAL_CREAM | Freq: Once | CUTANEOUS | Status: DC

## 2013-09-04 NOTE — Assessment & Plan Note (Signed)
Will treat with permethrin cream. Prescription printed and given to patient today. AVS on scabies, treatment and prevention within home. She was advised that she is can last up to 4 weeks. Followup in 4 weeks as needed.

## 2013-09-04 NOTE — Progress Notes (Signed)
   Subjective:    Patient ID: Rachel CrosbyPatrice R Turrubiates, female    DOB: 06/17/1980, 33 y.o.   MRN: [id number]  Vaginal Discharge The patient's primary symptoms include a vaginal discharge. Associated symptoms include rash.  Rash   Patient presents to same-day appointment  Vaginal irritation: Patient states about a week ago she started having vaginal irritation in weight discharge. Patient denies recent change in sexual partners or recent sexual contact. Patient admits to recent antibiotic use and history of diabetes. Patient states that the irritation is itchy. She states she had a "breakout "in her left hip. She does not have a history of herpes.  Skin irritation: Patient states that for the last 6 months her and her children have both been effective but itchy bumps on her skin. She states she was tried on clindamycin and a steroid cream, which was not helpful a few months ago. In addition she has moved out of her prior apartment and into a new place. She has concerns now because her older daughter is also starting to itch.   Review of Systems  Genitourinary: Positive for vaginal discharge.  Skin: Positive for rash.   Per history of present illness    Objective:   Physical Exam  Genitourinary:      BP 148/95  Pulse 75  Temp(Src) 98 F (36.7 C) (Oral)  Wt 145 lb (65.772 kg)  LMP 08/14/2013 Gen: Pleasant African American female, no acute distress, nontoxic in appearance, well-developed, well-nourished. Skin: Small raised bumps on her back, between buttocks and under her breasts, no erythema or excoriation. No notable burrowing signs. No lesions between the fingers.  GYN:  External genitalia within normal limits.  Vaginal mucosa pink, moist, normal rugae.  Nonfriable cervix without lesions, moderate white creamy discharge, No bleeding noted on speculum exam.  Bimanual exam revealed normal, nongravid uterus.  No cervical motion tenderness. No adnexal masses bilaterally.  Small, ?vesicular  lesion left lower labia majora.        Assessment & Plan:

## 2013-09-04 NOTE — Patient Instructions (Addendum)
Scabies Scabies are small bugs (mites) that burrow under the skin and cause red bumps and severe itching. These bugs can only be seen with a microscope. Scabies are highly contagious. They can spread easily from person to person by direct contact. They are also spread through sharing clothing or linens that have the scabies mites living in them. It is not unusual for an entire family to become infected through shared towels, clothing, or bedding.  HOME CARE INSTRUCTIONS   Your caregiver may prescribe a cream or lotion to kill the mites. If cream is prescribed, massage the cream into the entire body from the neck to the bottom of both feet. Also massage the cream into the scalp and face if your child is less than 33 year old. Avoid the eyes and mouth. Do not wash your hands after application.  Leave the cream on for 8 to 12 hours. Your child should bathe or shower after the 8 to 12 hour application period. Sometimes it is helpful to apply the cream to your child right before bedtime.  One treatment is usually effective and will eliminate approximately 95% of infestations. For severe cases, your caregiver may decide to repeat the treatment in 1 week. Everyone in your household should be treated with one application of the cream.  New rashes or burrows should not appear within 24 to 48 hours after successful treatment. However, the itching and rash may last for 2 to 4 weeks after successful treatment. Your caregiver may prescribe a medicine to help with the itching or to help the rash go away more quickly.  Scabies can live on clothing or linens for up to 3 days. All of your child's recently used clothing, towels, stuffed toys, and bed linens should be washed in hot water and then dried in a dryer for at least 20 minutes on high heat. Items that cannot be washed should be enclosed in a plastic bag for at least 3 days.  To help relieve itching, bathe your child in a cool bath or apply cool washcloths to the  affected areas.  Your child may return to school after treatment with the prescribed cream. SEEK MEDICAL CARE IF:   The itching persists longer than 4 weeks after treatment.  The rash spreads or becomes infected. Signs of infection include red blisters or yellow-tan crust. Document Released: 01/16/2005 Document Revised: 04/10/2011 Document Reviewed: 05/27/2008 [facility] Patient Information 2015 [place], [place]. This information is not intended to replace advice given to you by your health care provider. Make sure you discuss any questions you have with your health care provider.   Bacterial Vaginosis Bacterial vaginosis is an infection of the vagina. It happens when too many of certain germs (bacteria) grow in the vagina. HOME CARE  Take your medicine as told by your doctor.  Finish your medicine even if you start to feel better.  Do not have sex until you finish your medicine and are better.  Tell your sex partner that you have an infection. They should see their doctor for treatment.  Practice safe sex. Use condoms. Have only one sex partner. GET HELP IF:  You are not getting better after 3 days of treatment.  You have more grey fluid (discharge) coming from your vagina than before.  You have more pain than before.  You have a fever. MAKE SURE YOU:   Understand these instructions.  Will watch your condition.  Will get help right away if you are not doing well or get worse.  Document Released: 10/26/2007 Document Revised: 11/06/2012 Document Reviewed: 08/28/2012 [facility] Patient Information 2015 [place]. This information is not intended to replace advice given to you by your health care provider. Make sure you discuss any questions you have with your health care provider.   You have a bacterial infection, which has not been sexually transmitted disease. You are prescribed Flagyl for you to use for 7 days. In addition I appreciate out a prescription for scabies  cream. Please follow the directions on the label. Please follow the directions we discussed on how to rid your home of current scabies infestation.

## 2013-09-04 NOTE — Assessment & Plan Note (Signed)
HSV 1 and 2 IgM and IgG collected today. Will call patient with results. If positive will treat with antiviral.

## 2013-09-04 NOTE — Assessment & Plan Note (Signed)
Wet prep and GC collected today Wet prep: Patient with multiple clue cells>> Flagyl prescribed Will call patient with results on GC.

## 2013-09-05 ENCOUNTER — Telehealth: Payer: Self-pay | Admitting: Family Medicine

## 2013-09-05 ENCOUNTER — Encounter: Payer: Self-pay | Admitting: Family Medicine

## 2013-09-05 LAB — HSV(HERPES SMPLX)ABS-I+II(IGG+IGM)-BLD
HERPES SIMPLEX VRS I-IGM AB (EIA): 1.8 {index} — AB
HSV 1 Glycoprotein G Ab, IgG: 12.5 IV — ABNORMAL HIGH
HSV 2 Glycoprotein G Ab, IgG: 9.71 IV — ABNORMAL HIGH

## 2013-09-05 MED ORDER — VALACYCLOVIR HCL 1 G PO TABS
1000.0000 mg | ORAL_TABLET | Freq: Two times a day (BID) | ORAL | Status: DC
Start: 2013-09-05 — End: 2015-09-14

## 2013-09-05 NOTE — Telephone Encounter (Signed)
Please call [name] and inform her that her labs were negative for gonorrhea or chlamydia. 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 I have called her in a medication for her to take twice a day for 10 days. She should be informed this  is a sexually transmitted disease and can be passed to sexual partners. This medication will help treat her current symptoms, but she will carry the virus, as herpes is not curable and can reoccur. I will send her information via mail to explain in more detail. In addition she can make an appointment with me or her PCP to discuss in more detail. Including suppressive therapy if she desires. Thanks.

## 2013-09-05 NOTE — Telephone Encounter (Signed)
Pt informed of test results and medication.  Appt scheduled for 10/14/2013 for follow up visit to discuss results in more detail with provider.  Clovis PuMartin, Tamika L, RN

## 2013-09-17 ENCOUNTER — Encounter (HOSPITAL_COMMUNITY): Payer: Self-pay | Admitting: Emergency Medicine

## 2013-09-17 ENCOUNTER — Emergency Department (HOSPITAL_COMMUNITY)
Admission: EM | Admit: 2013-09-17 | Discharge: 2013-09-17 | Disposition: A | Discharge status: Home / Self Care | Payer: Medicaid Other | Attending: Emergency Medicine | Admitting: Emergency Medicine

## 2013-09-17 DIAGNOSIS — I10 Essential (primary) hypertension: Secondary | ICD-10-CM | POA: Diagnosis not present

## 2013-09-17 DIAGNOSIS — Z8739 Personal history of other diseases of the musculoskeletal system and connective tissue: Secondary | ICD-10-CM | POA: Insufficient documentation

## 2013-09-17 DIAGNOSIS — R1013 Epigastric pain: Secondary | ICD-10-CM | POA: Diagnosis not present

## 2013-09-17 DIAGNOSIS — Z3202 Encounter for pregnancy test, result negative: Secondary | ICD-10-CM | POA: Diagnosis not present

## 2013-09-17 DIAGNOSIS — R1084 Generalized abdominal pain: Secondary | ICD-10-CM | POA: Insufficient documentation

## 2013-09-17 DIAGNOSIS — Z8742 Personal history of other diseases of the female genital tract: Secondary | ICD-10-CM | POA: Insufficient documentation

## 2013-09-17 DIAGNOSIS — K219 Gastro-esophageal reflux disease without esophagitis: Secondary | ICD-10-CM | POA: Insufficient documentation

## 2013-09-17 DIAGNOSIS — Z794 Long term (current) use of insulin: Secondary | ICD-10-CM | POA: Diagnosis not present

## 2013-09-17 DIAGNOSIS — Z79899 Other long term (current) drug therapy: Secondary | ICD-10-CM | POA: Diagnosis not present

## 2013-09-17 DIAGNOSIS — F17200 Nicotine dependence, unspecified, uncomplicated: Secondary | ICD-10-CM | POA: Diagnosis not present

## 2013-09-17 DIAGNOSIS — E109 Type 1 diabetes mellitus without complications: Secondary | ICD-10-CM | POA: Insufficient documentation

## 2013-09-17 DIAGNOSIS — R112 Nausea with vomiting, unspecified: Secondary | ICD-10-CM | POA: Insufficient documentation

## 2013-09-17 DIAGNOSIS — Z792 Long term (current) use of antibiotics: Secondary | ICD-10-CM | POA: Diagnosis not present

## 2013-09-17 LAB — CBC WITH DIFFERENTIAL/PLATELET
BASOS PCT: 0 % (ref 0–1)
Basophils Absolute: 0 10*3/uL (ref 0.0–0.1)
EOS ABS: 0 10*3/uL (ref 0.0–0.7)
Eosinophils Relative: 0 % (ref 0–5)
HCT: 37.2 % (ref 36.0–46.0)
HEMOGLOBIN: 12.5 g/dL (ref 12.0–15.0)
LYMPHS ABS: 0.8 10*3/uL (ref 0.7–4.0)
Lymphocytes Relative: 11 % — ABNORMAL LOW (ref 12–46)
MCH: 29.8 pg (ref 26.0–34.0)
MCHC: 33.6 g/dL (ref 30.0–36.0)
MCV: 88.6 fL (ref 78.0–100.0)
MONO ABS: 0.2 10*3/uL (ref 0.1–1.0)
MONOS PCT: 2 % — AB (ref 3–12)
NEUTROS PCT: 87 % — AB (ref 43–77)
Neutro Abs: 6.5 10*3/uL (ref 1.7–7.7)
Platelets: 238 10*3/uL (ref 150–400)
RBC: 4.2 MIL/uL (ref 3.87–5.11)
RDW: 14.8 % (ref 11.5–15.5)
WBC: 7.5 10*3/uL (ref 4.0–10.5)

## 2013-09-17 LAB — COMPREHENSIVE METABOLIC PANEL
ALBUMIN: 5 g/dL (ref 3.5–5.2)
ALK PHOS: 66 U/L (ref 39–117)
ALT: 21 U/L (ref 0–35)
ANION GAP: 21 — AB (ref 5–15)
AST: 32 U/L (ref 0–37)
BUN: 8 mg/dL (ref 6–23)
CO2: 20 mEq/L (ref 19–32)
CREATININE: 0.62 mg/dL (ref 0.50–1.10)
Calcium: 10 mg/dL (ref 8.4–10.5)
Chloride: 100 mEq/L (ref 96–112)
GFR calc non Af Amer: >90 mL/min (ref >90)
Glucose, Bld: 213 mg/dL — ABNORMAL HIGH (ref 70–99)
Potassium: 3.8 mEq/L (ref 3.7–5.3)
Sodium: 141 mEq/L (ref 137–147)
TOTAL PROTEIN: 8.5 g/dL — AB (ref 6.0–8.3)
Total Bilirubin: 0.6 mg/dL (ref 0.3–1.2)

## 2013-09-17 LAB — URINALYSIS, ROUTINE W REFLEX MICROSCOPIC
BILIRUBIN URINE: NEGATIVE
Glucose, UA: >1000 mg/dL — AB
Ketones, ur: 40 mg/dL — AB
Leukocytes, UA: NEGATIVE
Nitrite: NEGATIVE
PROTEIN: NEGATIVE mg/dL
Specific Gravity, Urine: 1.021 (ref 1.005–1.030)
Urobilinogen, UA: 0.2 mg/dL (ref 0.0–1.0)
pH: 7.5 (ref 5.0–8.0)

## 2013-09-17 LAB — URINE MICROSCOPIC-ADD ON

## 2013-09-17 LAB — POC URINE PREG, ED: Preg Test, Ur: NEGATIVE

## 2013-09-17 LAB — LIPASE, BLOOD: Lipase: 13 U/L (ref 11–59)

## 2013-09-17 LAB — CBG MONITORING, ED: Glucose-Capillary: 222 mg/dL — ABNORMAL HIGH (ref 70–99)

## 2013-09-17 MED ORDER — SODIUM CHLORIDE 0.9 % IV BOLUS (SEPSIS)
1000.0000 mL | Freq: Once | INTRAVENOUS | Status: AC
  Administered 2013-09-17: 1000 mL via INTRAVENOUS

## 2013-09-17 MED ORDER — GI COCKTAIL ~~LOC~~
30.0000 mL | Freq: Once | ORAL | Status: AC
  Administered 2013-09-17: 30 mL via ORAL
  Filled 2013-09-17: qty 30

## 2013-09-17 MED ORDER — HYDROCODONE-ACETAMINOPHEN 5-325 MG PO TABS: 2.0000 | ORAL_TABLET | ORAL | Status: DC | PRN

## 2013-09-17 MED ORDER — RANITIDINE HCL 150 MG PO TABS: 150.0000 mg | ORAL_TABLET | Freq: Two times a day (BID) | ORAL | Status: DC

## 2013-09-17 MED ORDER — MORPHINE SULFATE 4 MG/ML IJ SOLN
4.0000 mg | Freq: Once | INTRAMUSCULAR | Status: AC
  Administered 2013-09-17: 4 mg via INTRAVENOUS
  Filled 2013-09-17: qty 1

## 2013-09-17 MED ORDER — ONDANSETRON HCL 4 MG/2ML IJ SOLN
4.0000 mg | Freq: Once | INTRAMUSCULAR | Status: AC
  Administered 2013-09-17: 4 mg via INTRAVENOUS
  Filled 2013-09-17: qty 2

## 2013-09-17 MED ORDER — PROMETHAZINE HCL 25 MG PO TABS: 25.0000 mg | ORAL_TABLET | Freq: Four times a day (QID) | ORAL | Status: DC | PRN

## 2013-09-17 NOTE — ED Notes (Signed)
Pt continues to be monitored by blood pressure, pulse ox, and 5 lead.  

## 2013-09-17 NOTE — ED Notes (Signed)
Pt was able to urinate using bedpan to provide urine specimen

## 2013-09-17 NOTE — ED Notes (Signed)
Pt placed on monitor upon arrival to room. Pt monitored by blood pressure, pulse ox, and 5 lead.  

## 2013-09-17 NOTE — ED Notes (Signed)
Pt presents to department for evaluation of diffuse abdominal pain, nausea, and vomiting. Ongoing x2 days. 9/10 abdominal pain upon arrival to ED. Pt is alert and oriented x4. NAD.

## 2013-09-17 NOTE — ED Notes (Signed)
Pt preg test is negative not postive

## 2013-09-17 NOTE — Discharge Instructions (Signed)
Abdominal Pain, Women °Abdominal (stomach, pelvic, or belly) pain can be caused by many things. It is important to tell your doctor: °· The location of the pain. °· Does it come and go or is it present all the time? °· Are there things that start the pain (eating certain foods, exercise)? °· Are there other symptoms associated with the pain (fever, nausea, vomiting, diarrhea)? °All of this is helpful to know when trying to find the cause of the pain. °CAUSES  °· Stomach: virus or bacteria infection, or ulcer. °· Intestine: appendicitis (inflamed appendix), regional ileitis (Crohn's disease), ulcerative colitis (inflamed colon), irritable bowel syndrome, diverticulitis (inflamed diverticulum of the colon), or cancer of the stomach or intestine. °· Gallbladder disease or stones in the gallbladder. °· Kidney disease, kidney stones, or infection. °· Pancreas infection or cancer. °· Fibromyalgia (pain disorder). °· Diseases of the female organs: °¨ Uterus: fibroid (non-cancerous) tumors or infection. °¨ Fallopian tubes: infection or tubal pregnancy. °¨ Ovary: cysts or tumors. °¨ Pelvic adhesions (scar tissue). °¨ Endometriosis (uterus lining tissue growing in the pelvis and on the pelvic organs). °¨ Pelvic congestion syndrome (female organs filling up with blood just before the menstrual period). °¨ Pain with the menstrual period. °¨ Pain with ovulation (producing an egg). °¨ Pain with an IUD (intrauterine device, birth control) in the uterus. °¨ Cancer of the female organs. °· Functional pain (pain not caused by a disease, may improve without treatment). °· Psychological pain. °· Depression. °DIAGNOSIS  °Your doctor will decide the seriousness of your pain by doing an examination. °· Blood tests. °· X-rays. °· Ultrasound. °· CT scan (computed tomography, special type of X-ray). °· MRI (magnetic resonance imaging). °· Cultures, for infection. °· Barium enema (dye inserted in the large intestine, to better view it with  X-rays). °· Colonoscopy (looking in intestine with a lighted tube). °· Laparoscopy (minor surgery, looking in abdomen with a lighted tube). °· Major abdominal exploratory surgery (looking in abdomen with a large incision). °TREATMENT  °The treatment will depend on the cause of the pain.  °· Many cases can be observed and treated at home. °· Over-the-counter medicines recommended by your caregiver. °· Prescription medicine. °· Antibiotics, for infection. °· Birth control pills, for painful periods or for ovulation pain. °· Hormone treatment, for endometriosis. °· Nerve blocking injections. °· Physical therapy. °· Antidepressants. °· Counseling with a psychologist or psychiatrist. °· Minor or major surgery. °HOME CARE INSTRUCTIONS  °· Do not take laxatives, unless directed by your caregiver. °· Take over-the-counter pain medicine only if ordered by your caregiver. Do not take aspirin because it can cause an upset stomach or bleeding. °· Try a clear liquid diet (broth or water) as ordered by your caregiver. Slowly move to a bland diet, as tolerated, if the pain is related to the stomach or intestine. °· Have a thermometer and take your temperature several times a day, and record it. °· Bed rest and sleep, if it helps the pain. °· Avoid sexual intercourse, if it causes pain. °· Avoid stressful situations. °· Keep your follow-up appointments and tests, as your caregiver orders. °· If the pain does not go away with medicine or surgery, you may try: °¨ Acupuncture. °¨ Relaxation exercises (yoga, meditation). °¨ Group therapy. °¨ Counseling. °SEEK MEDICAL CARE IF:  °· You notice certain foods cause stomach pain. °· Your home care treatment is not helping your pain. °· You need stronger pain medicine. °· You want your IUD removed. °· You feel faint or   lightheaded. °· You develop nausea and vomiting. °· You develop a rash. °· You are having side effects or an allergy to your medicine. °SEEK IMMEDIATE MEDICAL CARE IF:  °· Your  pain does not go away or gets worse. °· You have a fever. °· Your pain is felt only in portions of the abdomen. The right side could possibly be appendicitis. The left lower portion of the abdomen could be colitis or diverticulitis. °· You are passing blood in your stools (bright red or black tarry stools, with or without vomiting). °· You have blood in your urine. °· You develop chills, with or without a fever. °· You pass out. °MAKE SURE YOU:  °· Understand these instructions. °· Will watch your condition. °· Will get help right away if you are not doing well or get worse. °Document Released: 11/13/2006 Document Revised: 06/02/2013 Document Reviewed: 12/03/2008 °[employer]® Patient Information ©2015 [employer], [employer]. This information is not intended to replace advice given to you by your health care provider. Make sure you discuss any questions you have with your health care provider. ° °

## 2013-09-17 NOTE — ED Provider Notes (Signed)
CSN: [id number]     Arrival date & time 09/17/13  1743 History   First MD Initiated Contact with Patient 09/17/13 1851     Chief Complaint  Patient presents with  . Emesis  . Abdominal Pain     (Consider location/radiation/quality/duration/timing/severity/associated sxs/prior Treatment) Patient is a 33 y.o. female presenting with abdominal pain. The history is provided by the patient and a friend.  Abdominal Pain Pain location:  Generalized Pain quality: sharp and stabbing   Pain radiates to:  Does not radiate Pain severity:  Severe Onset quality:  Gradual Duration:  2 days Timing:  Constant Progression:  Worsening Chronicity:  Recurrent Relieved by:  Nothing Worsened by:  Nothing tried Ineffective treatments:  None tried Associated symptoms: nausea and vomiting   Associated symptoms: no chest pain, no chills, no diarrhea, no dysuria, no fever and no shortness of breath    33 yo F with a chief complaint of abdominal pain. Patient states that is below the umbilicus stretching from flank to flank. Patient states cramping comes and goes feels like someone stabbing her. Does associate with nausea and vomiting. Patient denies diarrhea. Patient states her last bowel movement was today just prior to arrival and that improved her pain somewhat. Patient denies any other improving factors patient denies any worsening factors. Patient denies fevers chills. Patient denies bilious emesis.  Past Medical History  Diagnosis Date  . Hyperlipidemia   . Hypertension   . Tobacco abuse   . Incarceration     Was in jail for selling drugs - out Oct 2010.  [name]. Renal disorder   . Type I diabetes mellitus     A1c > 11. Patient does not check BG or take insulin regularly.  Dx at age 89.  . Chronic bronchitis     "get it q yr" (08/21/2012)  . Exertional shortness of breath   . GERD (gastroesophageal reflux disease)   . YQMVHQIO(962.9Headache(784.0)     "weekly" (08/21/2012)  . Arthritis     "legs and hands"  (08/21/2012)   Past Surgical History  Procedure Laterality Date  . Tubal ligation  2009   No family history on file. History  Substance Use Topics  . Smoking status: Current Every Day Smoker -- 0.33 packs/day for 14 years    Types: Cigarettes  . Smokeless tobacco: Never Used  . Alcohol Use: 0.0 oz/week     Comment: 08/21/2012 "might drink 1-2 glasses of liquor once/month"   OB History   Grav Para Term Preterm Abortions TAB SAB Ect Mult Living                 Review of Systems  Constitutional: Negative for fever and chills.  HENT: Negative for congestion and rhinorrhea.   Eyes: Negative for redness and visual disturbance.  Respiratory: Negative for shortness of breath and wheezing.   Cardiovascular: Negative for chest pain and palpitations.  Gastrointestinal: Positive for nausea, vomiting and abdominal pain. Negative for diarrhea.  Genitourinary: Negative for dysuria and urgency.  Musculoskeletal: Negative for arthralgias and myalgias.  Skin: Negative for pallor and wound.  Neurological: Negative for dizziness and headaches.      Allergies  Review of patient's allergies indicates no known allergies.  Home Medications   Prior to Admission medications   Medication Sig Start Date End Date Taking? Authorizing Provider  insulin aspart (NOVOLOG) 100 UNIT/ML injection Inject 5-6 Units into the skin 3 (three) times daily as needed. For CBG over 180 09/25/12  Yes Stephanie E Losq,  MD  insulin glargine (LANTUS) 100 UNIT/ML injection Inject 0.3 mLs (30 Units total) into the skin at bedtime. 09/25/12  Yes Lonia Skinner, MD  lisinopril (PRINIVIL,ZESTRIL) 10 MG tablet Take 1 tablet (10 mg total) by mouth daily. 09/25/12  Yes Lonia Skinner, MD  metroNIDAZOLE (FLAGYL) 500 MG tablet Take 1 tablet (500 mg total) by mouth 2 (two) times daily. 09/04/13  Yes Renee A Kuneff, DO  Multiple Vitamin (MULTIVITAMIN WITH MINERALS) TABS Take 1 tablet by mouth every other day.    Yes Historical  Provider, MD  pantoprazole (PROTONIX) 40 MG tablet Take 40 mg by mouth daily.   Yes Historical Provider, MD  valACYclovir (VALTREX) 1000 MG tablet Take 1 tablet (1,000 mg total) by mouth 2 (two) times daily. 09/05/13  Yes Renee A Kuneff, DO  HYDROcodone-acetaminophen (NORCO/VICODIN) 5-325 MG per tablet Take 2 tablets by mouth every 4 (four) hours as needed for moderate pain or severe pain. 09/17/13   Melene Plan, MD  promethazine (PHENERGAN) 25 MG tablet Take 1 tablet (25 mg total) by mouth every 6 (six) hours as needed for nausea or vomiting. 09/17/13   Melene Plan, MD  ranitidine (ZANTAC) 150 MG tablet Take 1 tablet (150 mg total) by mouth 2 (two) times daily. 09/17/13   Melene Plan, MD   BP 123/82  Pulse 76  Temp(Src) 97.6 F (36.4 C) (Oral)  Resp 18  Ht 5\' 6"  (1.676 m)  Wt 140 lb (63.504 kg)  BMI 22.61 kg/m2  SpO2 96%  LMP 08/14/2013 Physical Exam  Constitutional: She is oriented to person, place, and time. She appears well-developed and well-nourished. No distress.  HENT:  Head: Normocephalic and atraumatic.  Eyes: EOM are normal. Pupils are equal, round, and reactive to light.  Neck: Normal range of motion. Neck supple.  Cardiovascular: Normal rate and regular rhythm.  Exam reveals no gallop and no friction rub.   No murmur heard. Pulmonary/Chest: Effort normal. She has no wheezes. She has no rales.  Abdominal: Soft. She exhibits no distension. There is tenderness (mild epigastric). There is no rebound and no guarding.  Musculoskeletal: She exhibits no edema and no tenderness.  Neurological: She is alert and oriented to person, place, and time.  Skin: Skin is warm and dry. She is not diaphoretic.  Psychiatric: She has a normal mood and affect. Her behavior is normal.    ED Course  Procedures (including critical care time) Labs Review Labs Reviewed  CBC WITH DIFFERENTIAL - Abnormal; Notable for the following:    Neutrophils Relative % 87 (*)    Lymphocytes Relative 11 (*)     Monocytes Relative 2 (*)    All other components within normal limits  COMPREHENSIVE METABOLIC PANEL - Abnormal; Notable for the following:    Glucose, Bld 213 (*)    Total Protein 8.5 (*)    Anion gap 21 (*)    All other components within normal limits  URINALYSIS, ROUTINE W REFLEX MICROSCOPIC - Abnormal; Notable for the following:    Glucose, UA >1000 (*)    Hgb urine dipstick TRACE (*)    Ketones, ur 40 (*)    All other components within normal limits  URINE MICROSCOPIC-ADD ON - Abnormal; Notable for the following:    Squamous Epithelial / LPF FEW (*)    All other components within normal limits  POC URINE PREG, ED - Abnormal; Notable for the following:    Preg Test, Ur POSITIVE (*)    All other components within normal  limits  CBG MONITORING, ED - Abnormal; Notable for the following:    Glucose-Capillary 222 (*)    All other components within normal limits  LIPASE, BLOOD  POC URINE PREG, ED    Imaging Review No results found.   EKG Interpretation None      MDM   Final diagnoses:  Epigastric abdominal pain    33 yo F with a chief complaint of abdominal pain and vomiting. Patient with a benign abdominal exam. We'll give a bolus of fluids Zofran morphine and reassess. Patient had an incorrect entry of a positive pregnancy test. This was confirmed with the tech who did the test.  Patient with benign workup. Patient's GI cocktail improved significantly.  11:11 PM:  I have discussed the diagnosis/risks/treatment options with the patient and believe the pt to be eligible for discharge home to follow-up with PCP. We also discussed returning to the ED immediately if new or worsening sx occur. We discussed the sx which are most concerning (e.g., worsening pain, fever) that necessitate immediate return. Medications administered to the patient during their visit and any new prescriptions provided to the patient are listed below.  Medications given during this visit Medications   sodium chloride 0.9 % bolus 1,000 mL (0 mLs Intravenous Stopped 09/17/13 2239)  morphine 4 MG/ML injection 4 mg (4 mg Intravenous Given 09/17/13 2045)  ondansetron (ZOFRAN) injection 4 mg (4 mg Intravenous Given 09/17/13 2045)  gi cocktail (Maalox,Lidocaine,Donnatal) (30 mLs Oral Given 09/17/13 2132)    New Prescriptions   HYDROCODONE-ACETAMINOPHEN (NORCO/VICODIN) 5-325 MG PER TABLET    Take 2 tablets by mouth every 4 (four) hours as needed for moderate pain or severe pain.   PROMETHAZINE (PHENERGAN) 25 MG TABLET    Take 1 tablet (25 mg total) by mouth every 6 (six) hours as needed for nausea or vomiting.   RANITIDINE (ZANTAC) 150 MG TABLET    Take 1 tablet (150 mg total) by mouth 2 (two) times daily.     Melene Plan, MD 09/17/13 [phone]

## 2013-09-17 NOTE — ED Notes (Signed)
Pt diabetic insulin dependent, actively vomiting, unable to get IV access

## 2013-09-18 LAB — POC URINE PREG, ED: Preg Test, Ur: NEGATIVE

## 2013-09-18 NOTE — ED Provider Notes (Signed)
I saw and evaluated the patient, reviewed the resident's note and I agree with the findings and plan.    Linwood DibblesJon Edoardo Laforte, MD 09/18/13 [phone]

## 2013-10-14 ENCOUNTER — Ambulatory Visit: Payer: Medicaid Other | Admitting: Family Medicine

## 2013-12-30 ENCOUNTER — Ambulatory Visit: Payer: Medicaid Other | Admitting: Family Medicine

## 2014-03-10 ENCOUNTER — Ambulatory Visit: Payer: Medicaid Other | Admitting: Family Medicine

## 2014-04-03 ENCOUNTER — Ambulatory Visit: Payer: Medicaid Other | Admitting: Family Medicine

## 2014-04-17 ENCOUNTER — Telehealth: Payer: Self-pay | Admitting: *Deleted

## 2014-04-17 NOTE — Telephone Encounter (Signed)
Attempted to call pt to schedule appt for diabetes and she says she gets turned down every time she comes for an appt. She stated that she wanted us to stop calling her. Went to check with Britta Mccreedybarbara to see if pt came to apply for assistance and she doesn't show up for those appts. Dinorah Masullo Bruna PotterBlount, CMA

## 2014-05-08 ENCOUNTER — Ambulatory Visit (INDEPENDENT_AMBULATORY_CARE_PROVIDER_SITE_OTHER): Payer: Self-pay | Admitting: Family Medicine

## 2014-05-08 ENCOUNTER — Telehealth: Payer: Self-pay | Admitting: *Deleted

## 2014-05-08 ENCOUNTER — Encounter: Payer: Self-pay | Admitting: Family Medicine

## 2014-05-08 ENCOUNTER — Other Ambulatory Visit (HOSPITAL_COMMUNITY)
Admission: RE | Admit: 2014-05-08 | Discharge: 2014-05-08 | Disposition: A | Discharge status: Home / Self Care | Payer: Medicaid Other | Source: Ambulatory Visit | Attending: Family Medicine | Admitting: Family Medicine

## 2014-05-08 VITALS — BP 155/89 | HR 85 | Temp 98.2°F | Ht 67.0 in | Wt 152.7 lb

## 2014-05-08 DIAGNOSIS — Z01419 Encounter for gynecological examination (general) (routine) without abnormal findings: Secondary | ICD-10-CM | POA: Insufficient documentation

## 2014-05-08 DIAGNOSIS — Z1151 Encounter for screening for human papillomavirus (HPV): Secondary | ICD-10-CM | POA: Insufficient documentation

## 2014-05-08 DIAGNOSIS — Z113 Encounter for screening for infections with a predominantly sexual mode of transmission: Secondary | ICD-10-CM | POA: Insufficient documentation

## 2014-05-08 DIAGNOSIS — N898 Other specified noninflammatory disorders of vagina: Secondary | ICD-10-CM

## 2014-05-08 LAB — POCT WET PREP (WET MOUNT): CLUE CELLS WET PREP WHIFF POC: POSITIVE

## 2014-05-08 LAB — POCT URINE PREGNANCY: PREG TEST UR: NEGATIVE

## 2014-05-08 MED ORDER — PERMETHRIN 5 % EX CREA: 1.0000 "application " | TOPICAL_CREAM | Freq: Once | CUTANEOUS | Status: DC

## 2014-05-08 MED ORDER — METRONIDAZOLE 500 MG PO TABS: 500.0000 mg | ORAL_TABLET | Freq: Two times a day (BID) | ORAL | Status: DC

## 2014-05-08 NOTE — Addendum Note (Signed)
Addended by: Fredirick LatheACOSTA, Bernhardt Riemenschneider on: 05/08/2014 02:28 PM   Modules accepted: Orders

## 2014-05-08 NOTE — Telephone Encounter (Signed)
LM for patient to call back. Jazmin Hartsell,CMA  

## 2014-05-08 NOTE — Telephone Encounter (Signed)
-----   Message from Fredirick LatheKristy Acosta, MD sent at 05/08/2014  2:28 PM EDT ----- Please notify patient that she has BV, I prescribed flagyl and sent to her pharmacy.  I attempted to call already but she did not answer.  Fredirick LatheKristy Acosta

## 2014-05-08 NOTE — Addendum Note (Signed)
Addended by: SwazilandJORDAN, Colette Dicamillo on: 05/08/2014 12:49 PM   Modules accepted: Orders

## 2014-05-08 NOTE — Telephone Encounter (Signed)
Pt called back is aware of results. Jazmin Hartsell,CMA

## 2014-05-08 NOTE — Addendum Note (Signed)
Addended by: Lamonte SakaiZIMMERMAN RUMPLE, APRIL D on: 05/08/2014 12:25 PM   Modules accepted: Orders

## 2014-05-08 NOTE — Progress Notes (Addendum)
Subjective:    Patient ID: Providence Crosby, female    DOB: October 09, 1980, 34 y.o.   MRN: [id number]  HPI  Scabies: thinks has scabies again, treated last month, itches on back and on chest.  Has some lesions on arms.  Had some cream left over and used but did not have enough for entire body.  Vaginal discharge and irregular period: vaginal discharge x few weeks, diagnosed with HSV recently.  No lesions or outbreaks.  Would like STD testing  Past Medical History  Diagnosis Date  . Hyperlipidemia   . Hypertension   . Tobacco abuse   . Incarceration     Was in jail for selling drugs - out Oct 2010.  [name] Renal disorder   . Type I diabetes mellitus     A1c > 11. Patient does not check BG or take insulin regularly.  Dx at age 68.  . Chronic bronchitis     "get it q yr" (08/21/2012)  . Exertional shortness of breath   . GERD (gastroesophageal reflux disease)   . AOZHYQMV(784.6)     "weekly" (08/21/2012)  . Arthritis     "legs and hands" (08/21/2012)     Review of Systems  Genitourinary: Positive for vaginal bleeding, vaginal discharge and menstrual problem.  Skin: Positive for rash and wound.       Objective:   Physical Exam  Constitutional: She appears well-developed and well-nourished. No distress.  HENT:  Mouth/Throat: Mucous membranes are moist. Pharynx is normal.  Eyes: Conjunctivae and EOM are normal.  Neck: No adenopathy.  Cardiovascular: Normal rate and S2 normal.   Pulmonary/Chest: Effort normal.  Abdominal: She exhibits no distension. There is no tenderness.  Genitourinary: Vagina normal. No vaginal discharge found.  Cervix visualized with speculum  Musculoskeletal: Normal range of motion.  Neurological: She is alert. No cranial nerve deficit. Coordination normal.  Skin: Skin is warm. Rash (excoriations, on back has scattered lesions, no tracking) noted. She is not diaphoretic. No pallor.      Assessment & Plan:  35yo female with multiple comorbidities - rash:  will empirically treat for scabies as had scabies last month and self-treated partially with left over cream.  Pt to retreat in 1 week.  rx permethrin cream - vaginal discharge: wet prep, G/C, HIV.  May return for follow up of irregular periods, POC pregnancy test. - last pap 3 years ago => also done today  Advised to f/u with PCP for DM and other comorbidities  Pearly Apachito ROCIO, MD 11:41 AM   Wet prep consistent with BV: rx flagyl, pt notified  Perry Mount, MD 2:26 PM

## 2014-05-09 LAB — HIV ANTIBODY (ROUTINE TESTING W REFLEX): HIV: NONREACTIVE

## 2014-05-11 LAB — CERVICOVAGINAL ANCILLARY ONLY
Chlamydia: NEGATIVE
Neisseria Gonorrhea: NEGATIVE

## 2014-05-12 LAB — CYTOLOGY - PAP

## 2014-07-27 ENCOUNTER — Other Ambulatory Visit: Payer: Self-pay

## 2014-08-05 ENCOUNTER — Ambulatory Visit (INDEPENDENT_AMBULATORY_CARE_PROVIDER_SITE_OTHER): Payer: Self-pay | Admitting: *Deleted

## 2014-08-05 DIAGNOSIS — Z111 Encounter for screening for respiratory tuberculosis: Secondary | ICD-10-CM

## 2014-08-05 NOTE — Progress Notes (Signed)
  PPD placed Left Forearm.  Pt to return 08/07/14 for reading.  Pt tolerated intradermal injection. Clovis PuMartin, Tamika L, RN

## 2014-08-07 ENCOUNTER — Ambulatory Visit (INDEPENDENT_AMBULATORY_CARE_PROVIDER_SITE_OTHER): Payer: Self-pay | Admitting: *Deleted

## 2014-08-07 ENCOUNTER — Encounter: Payer: Self-pay | Admitting: *Deleted

## 2014-08-07 DIAGNOSIS — Z7689 Persons encountering health services in other specified circumstances: Secondary | ICD-10-CM

## 2014-08-07 DIAGNOSIS — Z111 Encounter for screening for respiratory tuberculosis: Secondary | ICD-10-CM

## 2014-08-07 LAB — TB SKIN TEST
Induration: 0 mm
TB SKIN TEST: NEGATIVE

## 2014-08-07 NOTE — Progress Notes (Signed)
   PPD Reading Note PPD read and results entered in EpicCare. Result: 0 mm induration. Interpretation: Negative If test not read within 48-72 hours of initial placement, patient advised to repeat in other arm 1-3 weeks after this test. Allergic reaction: no  Martin, Tamika L, RN  

## 2014-10-23 ENCOUNTER — Ambulatory Visit: Payer: Medicaid Other | Admitting: Family Medicine

## 2014-12-07 ENCOUNTER — Ambulatory Visit: Payer: Medicaid Other | Admitting: Family Medicine

## 2015-01-25 ENCOUNTER — Emergency Department (HOSPITAL_COMMUNITY)
Admission: EM | Admit: 2015-01-25 | Discharge: 2015-01-26 | Disposition: A | Discharge status: Home / Self Care | Payer: Self-pay | Attending: Emergency Medicine | Admitting: Emergency Medicine

## 2015-01-25 ENCOUNTER — Encounter (HOSPITAL_COMMUNITY): Payer: Self-pay | Admitting: *Deleted

## 2015-01-25 ENCOUNTER — Emergency Department (HOSPITAL_COMMUNITY): Payer: Medicaid Other

## 2015-01-25 ENCOUNTER — Emergency Department (HOSPITAL_COMMUNITY): Payer: Self-pay

## 2015-01-25 DIAGNOSIS — Z87448 Personal history of other diseases of urinary system: Secondary | ICD-10-CM | POA: Insufficient documentation

## 2015-01-25 DIAGNOSIS — E1065 Type 1 diabetes mellitus with hyperglycemia: Secondary | ICD-10-CM | POA: Insufficient documentation

## 2015-01-25 DIAGNOSIS — R1030 Lower abdominal pain, unspecified: Secondary | ICD-10-CM

## 2015-01-25 DIAGNOSIS — K219 Gastro-esophageal reflux disease without esophagitis: Secondary | ICD-10-CM | POA: Insufficient documentation

## 2015-01-25 DIAGNOSIS — R112 Nausea with vomiting, unspecified: Secondary | ICD-10-CM | POA: Insufficient documentation

## 2015-01-25 DIAGNOSIS — Z8709 Personal history of other diseases of the respiratory system: Secondary | ICD-10-CM | POA: Insufficient documentation

## 2015-01-25 DIAGNOSIS — F17210 Nicotine dependence, cigarettes, uncomplicated: Secondary | ICD-10-CM | POA: Insufficient documentation

## 2015-01-25 DIAGNOSIS — R1032 Left lower quadrant pain: Secondary | ICD-10-CM | POA: Insufficient documentation

## 2015-01-25 DIAGNOSIS — Z794 Long term (current) use of insulin: Secondary | ICD-10-CM | POA: Insufficient documentation

## 2015-01-25 DIAGNOSIS — M19042 Primary osteoarthritis, left hand: Secondary | ICD-10-CM | POA: Insufficient documentation

## 2015-01-25 DIAGNOSIS — Z79899 Other long term (current) drug therapy: Secondary | ICD-10-CM | POA: Insufficient documentation

## 2015-01-25 DIAGNOSIS — M19041 Primary osteoarthritis, right hand: Secondary | ICD-10-CM | POA: Insufficient documentation

## 2015-01-25 DIAGNOSIS — I10 Essential (primary) hypertension: Secondary | ICD-10-CM | POA: Insufficient documentation

## 2015-01-25 DIAGNOSIS — Z9851 Tubal ligation status: Secondary | ICD-10-CM | POA: Insufficient documentation

## 2015-01-25 DIAGNOSIS — R1012 Left upper quadrant pain: Secondary | ICD-10-CM | POA: Insufficient documentation

## 2015-01-25 LAB — URINALYSIS, ROUTINE W REFLEX MICROSCOPIC
Bilirubin Urine: NEGATIVE
Glucose, UA: >1000 mg/dL — AB
Ketones, ur: 40 mg/dL — AB
LEUKOCYTES UA: NEGATIVE
Nitrite: NEGATIVE
PROTEIN: NEGATIVE mg/dL
Specific Gravity, Urine: 1.036 — ABNORMAL HIGH (ref 1.005–1.030)
pH: 6 (ref 5.0–8.0)

## 2015-01-25 LAB — URINE MICROSCOPIC-ADD ON
BACTERIA UA: NONE SEEN
WBC, UA: NONE SEEN WBC/hpf (ref 0–5)

## 2015-01-25 LAB — CBC
HCT: 40 % (ref 36.0–46.0)
Hemoglobin: 13.3 g/dL (ref 12.0–15.0)
MCH: 29.7 pg (ref 26.0–34.0)
MCHC: 33.3 g/dL (ref 30.0–36.0)
MCV: 89.3 fL (ref 78.0–100.0)
PLATELETS: 316 10*3/uL (ref 150–400)
RBC: 4.48 MIL/uL (ref 3.87–5.11)
RDW: 14.2 % (ref 11.5–15.5)
WBC: 18.2 10*3/uL — AB (ref 4.0–10.5)

## 2015-01-25 LAB — I-STAT BETA HCG BLOOD, ED (MC, WL, AP ONLY)

## 2015-01-25 LAB — COMPREHENSIVE METABOLIC PANEL
ALT: 31 U/L (ref 14–54)
AST: 32 U/L (ref 15–41)
Albumin: 4.8 g/dL (ref 3.5–5.0)
Alkaline Phosphatase: 69 U/L (ref 38–126)
Anion gap: 13 (ref 5–15)
BUN: 17 mg/dL (ref 6–20)
CHLORIDE: 101 mmol/L (ref 101–111)
CO2: 28 mmol/L (ref 22–32)
CREATININE: 0.94 mg/dL (ref 0.44–1.00)
Calcium: 10.3 mg/dL (ref 8.9–10.3)
Glucose, Bld: 334 mg/dL — ABNORMAL HIGH (ref 65–99)
Potassium: 4.3 mmol/L (ref 3.5–5.1)
Sodium: 142 mmol/L (ref 135–145)
Total Bilirubin: 1.1 mg/dL (ref 0.3–1.2)
Total Protein: 8.9 g/dL — ABNORMAL HIGH (ref 6.5–8.1)

## 2015-01-25 LAB — LIPASE, BLOOD: LIPASE: 16 U/L (ref 11–51)

## 2015-01-25 LAB — CBG MONITORING, ED: GLUCOSE-CAPILLARY: 331 mg/dL — AB (ref 65–99)

## 2015-01-25 LAB — I-STAT CG4 LACTIC ACID, ED: LACTIC ACID, VENOUS: 3.41 mmol/L — AB (ref 0.5–2.0)

## 2015-01-25 MED ORDER — IOHEXOL 300 MG/ML  SOLN
25.0000 mL | Freq: Once | INTRAMUSCULAR | Status: AC | PRN
  Administered 2015-01-25: 25 mL via ORAL

## 2015-01-25 MED ORDER — IOHEXOL 300 MG/ML  SOLN
100.0000 mL | Freq: Once | INTRAMUSCULAR | Status: AC | PRN
  Administered 2015-01-25: 100 mL via INTRAVENOUS

## 2015-01-25 MED ORDER — SODIUM CHLORIDE 0.9 % IV BOLUS (SEPSIS)
1000.0000 mL | Freq: Once | INTRAVENOUS | Status: AC
  Administered 2015-01-25: 1000 mL via INTRAVENOUS

## 2015-01-25 MED ORDER — METOCLOPRAMIDE HCL 5 MG/ML IJ SOLN
10.0000 mg | Freq: Once | INTRAMUSCULAR | Status: AC
  Administered 2015-01-25: 10 mg via INTRAVENOUS
  Filled 2015-01-25: qty 2

## 2015-01-25 MED ORDER — MORPHINE SULFATE (PF) 2 MG/ML IV SOLN
2.0000 mg | Freq: Once | INTRAVENOUS | Status: AC
  Administered 2015-01-25: 2 mg via INTRAVENOUS
  Filled 2015-01-25: qty 1

## 2015-01-25 MED ORDER — PROMETHAZINE HCL 25 MG/ML IJ SOLN
25.0000 mg | Freq: Once | INTRAMUSCULAR | Status: AC
  Administered 2015-01-26: 25 mg via INTRAVENOUS
  Filled 2015-01-25: qty 1

## 2015-01-25 MED ORDER — ONDANSETRON HCL 4 MG/2ML IJ SOLN
4.0000 mg | Freq: Once | INTRAMUSCULAR | Status: AC
  Administered 2015-01-25: 4 mg via INTRAVENOUS
  Filled 2015-01-25: qty 2

## 2015-01-25 MED ORDER — MORPHINE SULFATE (PF) 4 MG/ML IV SOLN
4.0000 mg | Freq: Once | INTRAVENOUS | Status: AC
  Administered 2015-01-25: 4 mg via INTRAVENOUS
  Filled 2015-01-25: qty 1

## 2015-01-25 NOTE — ED Provider Notes (Signed)
CSN: [id number]     Arrival date & time 01/25/15  1451 History   First MD Initiated Contact with Patient 01/25/15 1920     Chief Complaint  Patient presents with  . Nausea  . Abdominal Pain  . Emesis  . Diarrhea   HPI   [name] is an 34 y.o. female with history of uncontrolled type 1 DM, HTN,  HLD, GERD who presents to the ED for evaluation of 2 days of abdominal pain with N/V/D. She states she has diffuse abdominal pain. She reports "too many" episodes of NBNB emesis and watery diarrhea for the past two days. Denies fever or chills. Denies dysuria, urinary frequency or urinary urgency. Pt notably has a history of type 1 DM but does not follow regularly with a PCP and has not been on insulin "in a long time" due to finances. She does not have a glucometer at home. She is unsure what her sugars usually run. She denies weakness, numbness, feeling faint/lightheaded. Denies sick contacts or recent travel.   Past Medical History  Diagnosis Date  . Hyperlipidemia   . Hypertension   . Tobacco abuse   . Incarceration     Was in jail for selling drugs - out Oct 2010.  [name] Renal disorder   . Type I diabetes mellitus (HCC)     A1c > 11. Patient does not check BG or take insulin regularly.  Dx at age 55.  . Chronic bronchitis (HCC)     "get it q yr" (08/21/2012)  . Exertional shortness of breath   . GERD (gastroesophageal reflux disease)   . WUJWJXBJ(478.2)     "weekly" (08/21/2012)  . Arthritis     "legs and hands" (08/21/2012)   Past Surgical History  Procedure Laterality Date  . Tubal ligation  2009   No family history on file. Social History  Substance Use Topics  . Smoking status: Current Every Day Smoker -- 0.33 packs/day for 14 years    Types: Cigarettes  . Smokeless tobacco: Never Used  . Alcohol Use: 0.0 oz/week     Comment: 08/21/2012 "might drink 1-2 glasses of liquor once/month"   OB History    No data available     Review of Systems  All other systems reviewed and are  negative.     Allergies  Review of patient's allergies indicates no known allergies.  Home Medications   Prior to Admission medications   Medication Sig Start Date End Date Taking? Authorizing Provider  insulin aspart protamine- aspart (NOVOLOG MIX 70/30) (70-30) 100 UNIT/ML injection Inject 25-28 Units into the skin daily with breakfast.   Yes Historical Provider, MD  HYDROcodone-acetaminophen (NORCO/VICODIN) 5-325 MG per tablet Take 2 tablets by mouth every 4 (four) hours as needed for moderate pain or severe pain. Patient not taking: Reported on 01/25/2015 09/17/13   Melene Plan, DO  insulin aspart (NOVOLOG) 100 UNIT/ML injection Inject 5-6 Units into the skin 3 (three) times daily as needed. For CBG over 180 Patient not taking: Reported on 01/25/2015 09/25/12   Lonia Skinner, MD  insulin glargine (LANTUS) 100 UNIT/ML injection Inject 0.3 mLs (30 Units total) into the skin at bedtime. Patient not taking: Reported on 01/25/2015 09/25/12   Lonia Skinner, MD  lisinopril (PRINIVIL,ZESTRIL) 10 MG tablet Take 1 tablet (10 mg total) by mouth daily. Patient not taking: Reported on 01/25/2015 09/25/12   Lonia Skinner, MD  metroNIDAZOLE (FLAGYL) 500 MG tablet Take 1 tablet (500 mg total) by mouth  2 (two) times daily. Patient not taking: Reported on 01/25/2015 05/08/14   Fredirick Lathe, MD  Multiple Vitamin (MULTIVITAMIN WITH MINERALS) TABS Take 1 tablet by mouth every other day.     Historical Provider, MD  pantoprazole (PROTONIX) 40 MG tablet Take 40 mg by mouth daily.    Historical Provider, MD  permethrin (ACTICIN) 5 % cream Apply 1 application topically once. repeat in 1 week Patient not taking: Reported on 01/25/2015 05/08/14   Fredirick Lathe, MD  promethazine (PHENERGAN) 25 MG tablet Take 1 tablet (25 mg total) by mouth every 6 (six) hours as needed for nausea or vomiting. Patient not taking: Reported on 01/25/2015 09/17/13   Melene Plan, DO  ranitidine (ZANTAC) 150 MG tablet Take 1 tablet (150  mg total) by mouth 2 (two) times daily. Patient not taking: Reported on 01/25/2015 09/17/13   Melene Plan, DO  valACYclovir (VALTREX) 1000 MG tablet Take 1 tablet (1,000 mg total) by mouth 2 (two) times daily. Patient not taking: Reported on 01/25/2015 09/05/13   Renee A Kuneff, DO   BP 135/87 mmHg  Pulse 71  Temp(Src) 98 F (36.7 C) (Oral)  Resp 16  SpO2 98%  LMP 12/26/2014 Physical Exam  Constitutional: She is oriented to person, place, and time.  Appears chronically ill, uncomfortable  HENT:  Right Ear: External ear normal.  Left Ear: External ear normal.  Nose: Nose normal.  Mouth/Throat: Mucous membranes are dry. No oropharyngeal exudate.  Eyes: Conjunctivae and EOM are normal. Pupils are equal, round, and reactive to light.  Neck: Normal range of motion. Neck supple.  Cardiovascular: Normal rate, regular rhythm, normal heart sounds and intact distal pulses.   Pulmonary/Chest: Effort normal and breath sounds normal. No respiratory distress. She has no wheezes. She exhibits no tenderness.  Abdominal: Soft. Bowel sounds are normal. She exhibits no distension. There is tenderness in the left upper quadrant and left lower quadrant. There is no rigidity, no rebound, no guarding and no CVA tenderness.  +Rovsing's  Musculoskeletal: She exhibits no edema.  Neurological: She is alert and oriented to person, place, and time. No cranial nerve deficit.  Skin: Skin is warm and dry.  Psychiatric: She has a normal mood and affect.  Nursing note and vitals reviewed.   ED Course  Procedures (including critical care time) Labs Review Labs Reviewed  COMPREHENSIVE METABOLIC PANEL - Abnormal; Notable for the following:    Glucose, Bld 334 (*)    Total Protein 8.9 (*)    All other components within normal limits  CBC - Abnormal; Notable for the following:    WBC 18.2 (*)    All other components within normal limits  URINALYSIS, ROUTINE W REFLEX MICROSCOPIC (NOT AT [facility]) - Abnormal; Notable for  the following:    Specific Gravity, Urine 1.036 (*)    Glucose, UA >1000 (*)    Hgb urine dipstick SMALL (*)    Ketones, ur 40 (*)    All other components within normal limits  URINE MICROSCOPIC-ADD ON - Abnormal; Notable for the following:    Squamous Epithelial / LPF 0-5 (*)    All other components within normal limits  CBG MONITORING, ED - Abnormal; Notable for the following:    Glucose-Capillary 331 (*)    All other components within normal limits  I-STAT CG4 LACTIC ACID, ED - Abnormal; Notable for the following:    Lactic Acid, Venous 3.41 (*)    All other components within normal limits  LIPASE, BLOOD  I-STAT BETA HCG  BLOOD, ED ([facility], [facility], [facility] ONLY)    Imaging Review Ct Abdomen Pelvis W Contrast  01/25/2015  CLINICAL DATA:  Mid abdominal pain with vomiting for 2 days. History of diabetes, reflux disease, hypertension and hyperlipidemia. EXAM: CT ABDOMEN AND PELVIS WITH CONTRAST TECHNIQUE: Multidetector CT imaging of the abdomen and pelvis was performed using the standard protocol following bolus administration of intravenous contrast. CONTRAST:  OMNIPAQUE IOHEXOL 300 MG/ML SOLN, 25mL OMNIPAQUE IOHEXOL 300 MG/ML SOLN COMPARISON:  CT abdomen and pelvis February 14th 2014 FINDINGS: Mild motion degraded examination. Motion through the pelvis required necessitated imaging. LUNG BASES: Included view of the lung bases are clear. Visualized heart and pericardium are unremarkable. SOLID ORGANS: The liver, spleen, gallbladder, pancreas and adrenal glands are unremarkable. GASTROINTESTINAL TRACT: Small hiatal hernia. The stomach, small and large bowel are normal in course and caliber without inflammatory changes. Normal appendix. KIDNEYS/ URINARY TRACT: Kidneys are orthotopic, demonstrating symmetric enhancement. Multifocal cortical scarring LEFT kidney. No nephrolithiasis, hydronephrosis or solid renal masses. The unopacified ureters are normal in course and caliber. Delayed imaging through the  kidneys demonstrates symmetric prompt contrast excretion within the proximal urinary collecting system. Urinary bladder is well distended and unremarkable. PERITONEUM/RETROPERITONEUM: Aortoiliac vessels are normal in course and caliber. No lymphadenopathy by CT size criteria. Internal reproductive organs are unremarkable. Small amount of free fluid in the pelvis is likely physiologic. Surgical clips in the pelvis. Phleboliths in the pelvis. SOFT TISSUE/OSSEOUS STRUCTURES: Non-suspicious. IMPRESSION: No acute intra-abdominal or pelvic process on this motion degraded examination. Electronically Signed   By: Awilda Metro M.D.   On: 01/25/2015 22:06   I have personally reviewed and evaluated these images and lab results as part of my medical decision-making.   EKG Interpretation None      MDM   Final diagnoses:  Lower abdominal pain  Non-intractable vomiting with nausea, vomiting of unspecified type    No anion gap. Glucose 334 on CMP. White count of 18.2. Lactic acid 3.41. Will obtain CT abd/pelvis to r/o appendicitis, colitis, other acute intraabdominal pathology though I suspect pt has viral gastritis. Her uncontrolled diabetes and dehydration is likely contributing to her lactic acid and white count as she does not currently appear to be in DKA. 1L NS bolus and 4mg  morphine given with improvement in pain. 4mg  Zofran IV given with improvement in nausea.  Pt with unremarkable CT abd/pelvis though she did have some motion on exam. She is complaining of increased abd pain again as well as nausea. Will give 2mg  morphine and Reglan. Discussed with attending MD Pfeiffer. Given negative CT findings if pt is able to tolerate PO we can d/c home with supportive meds and PCP f/u. Pt is historically noncompliant and often lost to follow up so I anticipate that I will give her a prescription for insulin at her most recent known dosage with resource guide to re-establish PCP.  Pt reports she tried ice  chips and vomited again. Will give 25mg  phenergan IV.   Pt sleeping comfortably in room now. Reports improvement in nausea with phenergan. Pt told me that she does not have insulin at home, does not take insulin regularly. She apparently told other staff that she is taking NovoLog 70/30 25-28U QAM. I will give refill rx for NovoLog given pt's h/o poor f/u and poor compliance as she was hyperglycemic in the ED today with evidence of chronically poorly controlled diabetes. Will give referral to Wellness as well as resource guide for re-establishment of primary care. I reiterated  the importance of glycemic control and primary care f/u. Pt verbalized understanding. Will also give rx for phenergan to take as needed. ER return precautions given.    Rachel CoriaSerena Y Dontrae Morini, PA-C 01/26/15 0107  Arby BarretteMarcy Pfeiffer, MD 02/06/15 1034

## 2015-01-25 NOTE — ED Notes (Signed)
Unable to collect labs because patient family member stated patient has been stuck twice and no one got the blood as well as patient would not turn on her back and take the blanket down so I could collect labs.  I made the nurse aware.

## 2015-01-25 NOTE — ED Notes (Signed)
Patient from home stating that she began to have GI symptoms about 2 days ago. Her blood sugar was 477 today and patient states she has taken her insulin today.

## 2015-01-25 NOTE — ED Notes (Signed)
Patient transported to CT 

## 2015-01-25 NOTE — ED Notes (Signed)
Lab draw unsuccessful pt refuses to let me look at other arm rn aware

## 2015-01-25 NOTE — ED Notes (Signed)
Pt to CT

## 2015-01-26 MED ORDER — PROMETHAZINE HCL 25 MG PO TABS: 25.0000 mg | ORAL_TABLET | Freq: Four times a day (QID) | ORAL | Status: DC | PRN

## 2015-01-26 MED ORDER — INSULIN ASPART PROT & ASPART (70-30 MIX) 100 UNIT/ML ~~LOC~~ SUSP: 25.0000 [IU] | Freq: Every day | SUBCUTANEOUS | Status: DC

## 2015-01-26 NOTE — Discharge Instructions (Signed)
You were seen in the emergency room today for evaluation of abdominal pain, nausea, and vomiting. Your CT scan was normal. Your bloodwork showed some abnormalities that are likely due to dehydration as well as your diabetes. We gave you 2L of IV fluids as well as nausea medicine in the emergency room. Please follow-up with your primary care provider within one week. It is imperative that you re-establish primary care for re-evaluation and ongoing management of your diabetes and other health issues. In the meantime I will give you a prescription for NovoLog insulin to take each morning. Please also get a glucometer so you can check your sugars at home to make sure they are not too high or too low. Return to the ER for new or worsening symptoms.

## 2015-01-26 NOTE — ED Notes (Signed)
Pt was able to tolerate fluids PO

## 2015-01-26 NOTE — ED Notes (Signed)
Pt given cab numbers. Pt is trying to find a ride home

## 2015-03-10 ENCOUNTER — Encounter: Payer: Medicaid Other | Admitting: Family Medicine

## 2015-03-18 ENCOUNTER — Encounter: Payer: Medicaid Other | Admitting: Family Medicine

## 2015-03-25 ENCOUNTER — Encounter: Payer: Self-pay | Admitting: Family Medicine

## 2015-03-25 ENCOUNTER — Ambulatory Visit (INDEPENDENT_AMBULATORY_CARE_PROVIDER_SITE_OTHER): Payer: BLUE CROSS/BLUE SHIELD | Admitting: Family Medicine

## 2015-03-25 VITALS — BP 156/98 | HR 89 | Temp 98.3°F | Ht 67.0 in | Wt 162.3 lb

## 2015-03-25 DIAGNOSIS — E103299 Type 1 diabetes mellitus with mild nonproliferative diabetic retinopathy without macular edema, unspecified eye: Secondary | ICD-10-CM | POA: Diagnosis not present

## 2015-03-25 DIAGNOSIS — H6093 Unspecified otitis externa, bilateral: Secondary | ICD-10-CM | POA: Diagnosis not present

## 2015-03-25 DIAGNOSIS — E119 Type 2 diabetes mellitus without complications: Secondary | ICD-10-CM

## 2015-03-25 DIAGNOSIS — E1065 Type 1 diabetes mellitus with hyperglycemia: Secondary | ICD-10-CM

## 2015-03-25 LAB — POCT GLYCOSYLATED HEMOGLOBIN (HGB A1C): HEMOGLOBIN A1C: 11.2

## 2015-03-25 MED ORDER — GLUCOSE BLOOD VI STRP: 1.0000 | ORAL_STRIP | Freq: Three times a day (TID) | Status: DC

## 2015-03-25 MED ORDER — ACCU-CHEK AVIVA DEVI: 1.0000 | Freq: Three times a day (TID) | Status: DC

## 2015-03-25 MED ORDER — ACCU-CHEK SOFTCLIX LANCET DEV MISC: 1.0000 | Freq: Three times a day (TID) | Status: DC

## 2015-03-26 LAB — BASIC METABOLIC PANEL WITH GFR
BUN: 7 mg/dL (ref 7–25)
CALCIUM: 9.2 mg/dL (ref 8.6–10.2)
CHLORIDE: 99 mmol/L (ref 98–110)
CO2: 26 mmol/L (ref 20–31)
CREATININE: 0.92 mg/dL (ref 0.50–1.10)
GFR, Est Non African American: 81 mL/min (ref >=60)
Glucose, Bld: 345 mg/dL — ABNORMAL HIGH (ref 65–99)
Potassium: 5.1 mmol/L (ref 3.5–5.3)
Sodium: 133 mmol/L — ABNORMAL LOW (ref 135–146)

## 2015-03-29 NOTE — Assessment & Plan Note (Signed)
Known diabetic retinopathy, mild in past but has very poorly controlled diabetes and out of care for several years - refer back to optho for updated retinopathy exam

## 2015-03-29 NOTE — Progress Notes (Signed)
Subjective:   [name] is a 35 y.o. female with a history of DM1, otitis externa here for f/u MMP  CHRONIC DIABETES  Disease Monitoring  Blood Sugar Ranges: doesn't check, meter broken  Polyuria: yes, sometimes   Visual problems: yes, getting more blurry recently  Medication Compliance: no, taking 70/30 due to cost, not adjusted as out of care  Medication Side Effects  Hypoglycemia: yes, rarely, manages with snacks   Preventitive Health Care  Eye Exam: due, referred  Foot Exam: due  Diet pattern: poor  Exercise: rare  Pt also reports ongoing ear pain, itching and discharge. She reports ears drain during the night and she pulls out large amounts of white stuff during the day. She has been seen for this and given antibiotic drops that did not help. She is getting frustrated as this has been going on for about 6 months.  Review of Systems:  Per HPI. All other systems reviewed and are negative.   PMH, PSH, Medications, Allergies, and FmHx reviewed and updated in EMR.  Social History: current smoker  Objective:  BP 156/98 mmHg  Pulse 89  Temp(Src) 98.3 F (36.8 C) (Oral)  Ht  (1.702 m)  Wt 162 lb 4.8 oz (73.619 kg)  BMI 25.41 kg/m2  LMP 03/23/2015  Gen:  35 y.o. female in NAD HEENT: NCAT, MMM, EOMI, PERRL, anicteric sclerae, TMs clear, mild flaking of ear canals and waxy build up at distal part of canal CV: RRR, no MRG, no JVD Resp: Non-labored, CTAB, no wheezes noted Abd: Soft, NTND, BS present, no guarding or organomegaly Ext: WWP, no edema MSK: Full ROM, strength intact Neuro: Alert and oriented, speech normal      Chemistry      Component Value Date/Time   NA 133* 03/25/2015 1624   K 5.1 03/25/2015 1624   CL 99 03/25/2015 1624   CO2 26 03/25/2015 1624   BUN 7 03/25/2015 1624   CREATININE 0.92 03/25/2015 1624   CREATININE 0.94 01/25/2015 1920      Component Value Date/Time   CALCIUM 9.2 03/25/2015 1624   ALKPHOS 69 01/25/2015 1920   AST 32  01/25/2015 1920   ALT 31 01/25/2015 1920   BILITOT 1.1 01/25/2015 1920      Lab Results  Component Value Date   WBC 18.2* 01/25/2015   HGB 13.3 01/25/2015   HCT 40.0 01/25/2015   MCV 89.3 01/25/2015   PLT 316 01/25/2015   Lab Results  Component Value Date   TSH 2.330 12/13/2011   Lab Results  Component Value Date   HGBA1C 11.2 03/25/2015   Assessment & Plan:     [name] is a 35 y.o. female here for ear pain and diabetes  Bilateral otitis externa 6 months of ear discharge and itching, treated with antibacterial drops in the past, flaking in canals on exam, normal TMs - refer to ENT for further evaluation  Mild nonproliferative diabetic retinopathy Known diabetic retinopathy, mild in past but has very poorly controlled diabetes and out of care for several years - refer back to optho for updated retinopathy exam  Diabetes mellitus type 1, uncontrolled, insulin dependent (HCC) Managed by diabetes center, very poorly controlled, A1c 11.2, recently on 70/30 due to cost, out of care d/t lack on insurance - rx new meter - wants to continue with DM center so stressed need to make appt asap - refer to optho for eye exam      Beverely Low, MD, MPH  [facility] PGY-3 03/29/2015 10:07 AM

## 2015-03-29 NOTE — Assessment & Plan Note (Signed)
Managed by diabetes center, very poorly controlled, A1c 11.2, recently on 70/30 due to cost, out of care d/t lack on insurance - rx new meter - wants to continue with DM center so stressed need to make appt asap - refer to optho for eye exam

## 2015-03-29 NOTE — Assessment & Plan Note (Signed)
6 months of ear discharge and itching, treated with antibacterial drops in the past, flaking in canals on exam, normal TMs - refer to ENT for further evaluation

## 2015-03-31 ENCOUNTER — Telehealth: Payer: Self-pay | Admitting: *Deleted

## 2015-03-31 NOTE — Telephone Encounter (Signed)
-----   Message from Abram Sander, MD sent at 03/29/2015 10:46 AM EST ----- Please inform patient of normal lab results except for very high glucose. Please remind her to make appt at diabetes center for this. Thanks.

## 2015-03-31 NOTE — Telephone Encounter (Signed)
Left message on voicemail for patient to call back. 

## 2015-04-27 ENCOUNTER — Encounter: Payer: Self-pay | Admitting: Family Medicine

## 2015-04-27 ENCOUNTER — Other Ambulatory Visit (HOSPITAL_COMMUNITY)
Admission: RE | Admit: 2015-04-27 | Discharge: 2015-04-27 | Disposition: A | Discharge status: Home / Self Care | Payer: BLUE CROSS/BLUE SHIELD | Source: Ambulatory Visit | Attending: Family Medicine | Admitting: Family Medicine

## 2015-04-27 ENCOUNTER — Ambulatory Visit (INDEPENDENT_AMBULATORY_CARE_PROVIDER_SITE_OTHER): Payer: BLUE CROSS/BLUE SHIELD | Admitting: Family Medicine

## 2015-04-27 VITALS — BP 144/97 | HR 98 | Temp 98.8°F | Ht 68.0 in | Wt 157.6 lb

## 2015-04-27 DIAGNOSIS — Z202 Contact with and (suspected) exposure to infections with a predominantly sexual mode of transmission: Secondary | ICD-10-CM | POA: Diagnosis not present

## 2015-04-27 DIAGNOSIS — A499 Bacterial infection, unspecified: Secondary | ICD-10-CM | POA: Diagnosis not present

## 2015-04-27 DIAGNOSIS — B9689 Other specified bacterial agents as the cause of diseases classified elsewhere: Secondary | ICD-10-CM

## 2015-04-27 DIAGNOSIS — Z113 Encounter for screening for infections with a predominantly sexual mode of transmission: Secondary | ICD-10-CM | POA: Insufficient documentation

## 2015-04-27 DIAGNOSIS — N926 Irregular menstruation, unspecified: Secondary | ICD-10-CM

## 2015-04-27 DIAGNOSIS — N760 Acute vaginitis: Secondary | ICD-10-CM

## 2015-04-27 LAB — POCT WET PREP (WET MOUNT): CLUE CELLS WET PREP WHIFF POC: POSITIVE

## 2015-04-27 LAB — POCT URINE PREGNANCY: Preg Test, Ur: NEGATIVE

## 2015-04-27 MED ORDER — METRONIDAZOLE 500 MG PO TABS: 500.0000 mg | ORAL_TABLET | Freq: Two times a day (BID) | ORAL | Status: DC

## 2015-04-27 NOTE — Patient Instructions (Signed)
Thank you for coming to see me today. It was a pleasure. Today we talked about:   Vaginal discharge: You will get a call regarding your test results that were done in the office. If negative, you will get a letter in the mail with regard to your STD testing.  If you have any questions or concerns, please do not hesitate to call the office at [phone].  Sincerely,  Jacquelin Hawkingalph Dorion Petillo, MD

## 2015-04-27 NOTE — Progress Notes (Signed)
    Subjective   [name] is a 35 y.o. female that presents for a same day visit  1. Vaginal discharge: Symptoms started one month ago. She reports itchiness with foul odor white/brownish discharge. No bloody discharge. She recently went to the hospital for nausea and vomiting, which has since resolved. No fevers. No abdominal pain. She is sexual active with men only. Three partners in the last year. She uses condoms every time since 6 months ago. She was not using condoms with the previous partner.  ROS Per HPI  Social History  Substance Use Topics  . Smoking status: Current Every Day Smoker -- 0.33 packs/day for 14 years    Types: Cigarettes  . Smokeless tobacco: Never Used  . Alcohol Use: 0.0 oz/week     Comment: 08/21/2012 "might drink 1-2 glasses of liquor once/month"    No Known Allergies  Objective   BP 144/97 mmHg  Pulse 98  Temp(Src) 98.8 F (37.1 C) (Oral)  Ht 5\' 8"  (1.727 m)  Wt 157 lb 9.6 oz (71.487 kg)  BMI 23.97 kg/m2  LMP 03/21/2015  General: Well appearing, no distress Genitourinary: Vagina with strong odor, thick white discharge and no bleeding. Cervix appears normal with no discharge from the os     Assessment and Plan   1. Bacterial vaginosis - POCT Wet Prep [facility] [place]) - Cervicovaginal ancillary only - metroNIDAZOLE (FLAGYL) 500 MG tablet; Take 1 tablet (500 mg total) by mouth 2 (two) times daily.  Dispense: 14 tablet; Refill: 0  2. Possible exposure to STD - HIV antibody - RPR  3. Missed period - POCT urine pregnancy

## 2015-04-28 ENCOUNTER — Encounter: Payer: Self-pay | Admitting: Family Medicine

## 2015-04-28 LAB — CERVICOVAGINAL ANCILLARY ONLY
CHLAMYDIA, DNA PROBE: NEGATIVE
Neisseria Gonorrhea: NEGATIVE

## 2015-04-28 LAB — RPR

## 2015-04-28 LAB — HIV ANTIBODY (ROUTINE TESTING W REFLEX): HIV: NONREACTIVE

## 2015-04-30 LAB — HM DIABETES EYE EXAM

## 2015-05-03 ENCOUNTER — Encounter: Payer: Self-pay | Admitting: Family Medicine

## 2015-05-20 ENCOUNTER — Encounter: Payer: Self-pay | Admitting: Family Medicine

## 2015-05-20 ENCOUNTER — Ambulatory Visit (INDEPENDENT_AMBULATORY_CARE_PROVIDER_SITE_OTHER): Payer: BLUE CROSS/BLUE SHIELD | Admitting: Family Medicine

## 2015-05-20 VITALS — BP 131/69 | HR 89 | Temp 98.2°F | Wt 161.0 lb

## 2015-05-20 DIAGNOSIS — N898 Other specified noninflammatory disorders of vagina: Secondary | ICD-10-CM

## 2015-05-20 DIAGNOSIS — B373 Candidiasis of vulva and vagina: Secondary | ICD-10-CM

## 2015-05-20 DIAGNOSIS — B3731 Acute candidiasis of vulva and vagina: Secondary | ICD-10-CM

## 2015-05-20 LAB — POCT WET PREP (WET MOUNT): Clue Cells Wet Prep Whiff POC: NEGATIVE

## 2015-05-20 MED ORDER — FLUCONAZOLE 150 MG PO TABS
ORAL_TABLET | ORAL | Status: DC
Start: 2015-05-20 — End: 2015-09-14

## 2015-05-20 NOTE — Assessment & Plan Note (Signed)
History consistent with yeast vaginitis, following metronidazole treatment for BV recently. Also with frequent unprotected intercourse without condoms.  Plan: 1. Check Wet prep today (patient self collected sample, deferred repeat pelvic exam) - consistent with +many yeast, negative clue cells or trich 2. Start Diflucan 150mg  PO x 1 tab, repeat dose on Day 3 if persistent symptoms 3. Counseled on safe sex with condoms to reduce vaginitis

## 2015-05-20 NOTE — Progress Notes (Signed)
Subjective:    Patient ID: [name], female    DOB: 23-Nov-1980, 35 y.o.   MRN: [id number]  [name] is a 35 y.o. female presenting on 05/20/2015 for Vaginitis   Patient presents for a same day appointment.   HPI  VAGINAL DISCHARGE / VAGINITIS: - Last seen on 04/27/15 for same symptoms, with vaginal discharge and itching, had wet prep showed BV, treated with Metronidazole with improvement and then seems symptoms returned. - Today presents with similar vaginal discharge and itching, no significant difference in symptoms. Describes discharge as "white" and sometimes thicker. Still sexually active, vaginal intercourse without condoms, same sexual partner since last visit (new within past few months) - Recent STD testing 04/27/15 with negative HIV, RPR, GC / Chlamydia - Denies fevers/chills, rash, vaginal bleeding, nausea, vomiting, abdominal pain   Social History  Substance Use Topics  . Smoking status: Current Every Day Smoker -- 0.33 packs/day for 14 years    Types: Cigarettes  . Smokeless tobacco: Never Used  . Alcohol Use: 0.0 oz/week     Comment: 08/21/2012 "might drink 1-2 glasses of liquor once/month"    Review of Systems Per HPI unless specifically indicated above     Objective:    BP 131/69 mmHg  Pulse 89  Temp(Src) 98.2 F (36.8 C) (Oral)  Wt 161 lb (73.029 kg)  LMP 05/01/2015  Wt Readings from Last 3 Encounters:  05/20/15 161 lb (73.029 kg)  04/27/15 157 lb 9.6 oz (71.487 kg)  03/25/15 162 lb 4.8 oz (73.619 kg)    Physical Exam  Constitutional: She appears well-developed and well-nourished. No distress.  Well-appearing, comfortable  HENT:  Mouth/Throat: Oropharynx is clear and moist.  Genitourinary: Vaginal discharge found.  Deferred repeat pelvic exam (last done 04/27/15)  Neurological: She is alert.  Skin: Skin is warm and dry. No rash noted. She is not diaphoretic.  Nursing note and vitals reviewed.  Results for orders placed or performed  in visit on 05/20/15  POCT Wet Prep [facility])  Result Value Ref Range   Source Wet Prep POC VAG    WBC, Wet Prep HPF POC 1-5    Bacteria Wet Prep HPF POC Moderate (A) None, Few, Too numerous to count   Clue Cells Wet Prep HPF POC None None, Too numerous to count   Clue Cells Wet Prep Whiff POC Negative Whiff    Yeast Wet Prep HPF POC Many    Trichomonas Wet Prep HPF POC NONE       Assessment & Plan:   Problem List Items Addressed This Visit    Yeast vaginitis - Primary    History consistent with yeast vaginitis, following metronidazole treatment for BV recently. Also with frequent unprotected intercourse without condoms.  Plan: 1. Check Wet prep today (patient self collected sample, deferred repeat pelvic exam) - consistent with +many yeast, negative clue cells or trich 2. Start Diflucan 150mg  PO x 1 tab, repeat dose on Day 3 if persistent symptoms 3. Counseled on safe sex with condoms to reduce vaginitis       Relevant Medications   fluconazole (DIFLUCAN) 150 MG tablet      Meds ordered this encounter  Medications  . fluconazole (DIFLUCAN) 150 MG tablet    Sig: Take one tablet by mouth on Day 1. Repeat dose 2nd tablet on Day 3.    Dispense:  2 tablet    Refill:  1      Follow up plan: Return in about 4 weeks (  around 06/17/2015), or if symptoms worsen or fail to improve, for vaginal discharge, recurrent bv.  Saralyn Pilar, DO [facility], PGY-3

## 2015-05-20 NOTE — Patient Instructions (Signed)
Thank you for coming in to clinic today.  1. Your symptoms sound consistent with a Vaginitis - irritation from likely BV or yeast. 2. Tested wet prep today looking for BV, Yeast, and Trichomonas - will call you with results. - If needed, will send in Clindamycin antibiotic 300mg  twice daily for 7 days, finish entire course (caution if you develop severe diarrhea as side-effect, stop medication and call us) - If positive for Yeast - will send Diflucan 150mg  pill, take 1 and then on Day 3 take 2nd pill. - If both medicines sent, finish Clindamcyin FIRST then take Diflucan.  This problem can be recurrent. Some people are more likely to get it than others, and it has to do with normal body chemistry and vaginal environment. One of the biggest risk factors for causing BV is unprotected sexual intercourse (without a condom), because semen can change the chemistry of your vaginal environment, causing the "good bacteria" reduce and the other bacteria to grow and cause your symptoms. Recommend to start using condoms with intercourse to see if this helps reduce your symptoms, do this especially for next 1 week while on treatment. Some other recommendations include taking a daily probiotic and yogurt can help reduce BV as well, this is not proven to work in all patients.  Please schedule a follow-up appointment with Dr Richarda BladeAdamo within 2 to 4 weeks for Vaginal Discharge if persistent or worsening  If you have any other questions or concerns, please feel free to call the clinic to contact me. You may also schedule an earlier appointment if necessary.  However, if your symptoms get significantly worse, please go to the Emergency Department to seek immediate medical attention.  Saralyn PilarAlexander Karamalegos, DO [facility]

## 2015-06-21 ENCOUNTER — Ambulatory Visit: Payer: BLUE CROSS/BLUE SHIELD | Admitting: Family Medicine

## 2015-07-14 ENCOUNTER — Ambulatory Visit (INDEPENDENT_AMBULATORY_CARE_PROVIDER_SITE_OTHER): Payer: BLUE CROSS/BLUE SHIELD | Admitting: Student

## 2015-07-14 ENCOUNTER — Other Ambulatory Visit (HOSPITAL_COMMUNITY)
Admission: RE | Admit: 2015-07-14 | Discharge: 2015-07-14 | Disposition: A | Discharge status: Home / Self Care | Payer: BLUE CROSS/BLUE SHIELD | Source: Ambulatory Visit | Attending: Family Medicine | Admitting: Family Medicine

## 2015-07-14 VITALS — Wt 164.0 lb

## 2015-07-14 DIAGNOSIS — E103299 Type 1 diabetes mellitus with mild nonproliferative diabetic retinopathy without macular edema, unspecified eye: Secondary | ICD-10-CM

## 2015-07-14 DIAGNOSIS — Z114 Encounter for screening for human immunodeficiency virus [HIV]: Secondary | ICD-10-CM

## 2015-07-14 DIAGNOSIS — N898 Other specified noninflammatory disorders of vagina: Secondary | ICD-10-CM

## 2015-07-14 DIAGNOSIS — Z7251 High risk heterosexual behavior: Secondary | ICD-10-CM

## 2015-07-14 DIAGNOSIS — E1065 Type 1 diabetes mellitus with hyperglycemia: Secondary | ICD-10-CM | POA: Diagnosis not present

## 2015-07-14 DIAGNOSIS — Z113 Encounter for screening for infections with a predominantly sexual mode of transmission: Secondary | ICD-10-CM | POA: Insufficient documentation

## 2015-07-14 DIAGNOSIS — Z202 Contact with and (suspected) exposure to infections with a predominantly sexual mode of transmission: Secondary | ICD-10-CM | POA: Diagnosis not present

## 2015-07-14 LAB — POCT URINALYSIS DIPSTICK
BILIRUBIN UA: NEGATIVE
Blood, UA: NEGATIVE
Glucose, UA: 500
KETONES UA: NEGATIVE
LEUKOCYTES UA: NEGATIVE
Nitrite, UA: NEGATIVE
PH UA: 6
Protein, UA: NEGATIVE
Spec Grav, UA: <=1.005
Urobilinogen, UA: 0.2

## 2015-07-14 LAB — POCT WET PREP (WET MOUNT): CLUE CELLS WET PREP WHIFF POC: POSITIVE

## 2015-07-14 NOTE — Patient Instructions (Signed)
Follow up with your PCP as needed You will be called about your results and any needed treatment If you have any questions or concerns, call the office at [phone]

## 2015-07-15 ENCOUNTER — Encounter: Payer: Self-pay | Admitting: Student

## 2015-07-15 DIAGNOSIS — Z7251 High risk heterosexual behavior: Secondary | ICD-10-CM | POA: Insufficient documentation

## 2015-07-15 LAB — CERVICOVAGINAL ANCILLARY ONLY
CHLAMYDIA, DNA PROBE: NEGATIVE
Neisseria Gonorrhea: NEGATIVE

## 2015-07-15 LAB — HIV ANTIBODY (ROUTINE TESTING W REFLEX): HIV: NONREACTIVE

## 2015-07-15 LAB — HEPATITIS C ANTIBODY: HCV Ab: NEGATIVE

## 2015-07-15 LAB — RPR

## 2015-07-15 MED ORDER — METRONIDAZOLE 500 MG PO TABS: 500.0000 mg | ORAL_TABLET | Freq: Two times a day (BID) | ORAL | Status: DC

## 2015-07-15 NOTE — Assessment & Plan Note (Addendum)
Tubal ligation in 2009

## 2015-07-15 NOTE — Assessment & Plan Note (Addendum)
No evidence on PID on exam.  -STD testing performed. Will follow and treat as needed - Wet prep + for BV, will treat with flagyn - UA neg for evidence of UTI

## 2015-07-15 NOTE — Progress Notes (Signed)
   Subjective:    Patient ID: [name], female    DOB: 06/21/1980, 35 y.o.   MRN: [id number]   CC: vaginal irritation  HPI: 35 y/o F presenting for vaginal irritation  Vaginal irritation - had sex with a new partner 2 weeks ago and the condom broke - since then she started to have vaginal irritation, with some itching - denies foul odor, abdominal pain, dysuria, or fever - LMP 6/6 - she would like STD testing  Smoking status reviewed  Review of Systems Per HPI else denies SOB, chest pain, recent illness   Objective:  Wt 164 lb (74.39 kg) Vitals and nursing note reviewed  General: NAD Cardiac/Respiratory:normal work of breathing Abdomen: soft, nontender, nondistended Skin: warm and dry, no rashes noted Neuro: alert and oriented  Pelvic: Normal EGBUS, normal vaginal canal with moderate white discharge, normal cervix with no CMT, normal mobile uterus, normal adnexa with no masses, no adnexal tenderness    Assessment & Plan:    Contraception Tubal ligation in 2009  Unprotected sexual intercourse No evidence on PID on exam.  -STD testing performed. Will follow and treat as needed - Wet prep + for BV, will treat with flagyn - UA neg for evidence of UTI      Alyssa A. Kennon RoundsHaney MD, MS Family Medicine Resident PGY-2 Pager [phone]

## 2015-07-26 ENCOUNTER — Encounter (HOSPITAL_COMMUNITY): Payer: Self-pay | Admitting: Nurse Practitioner

## 2015-07-26 ENCOUNTER — Emergency Department (HOSPITAL_COMMUNITY)
Admission: EM | Admit: 2015-07-26 | Discharge: 2015-07-26 | Disposition: A | Discharge status: Home / Self Care | Payer: BLUE CROSS/BLUE SHIELD | Attending: Emergency Medicine | Admitting: Emergency Medicine

## 2015-07-26 ENCOUNTER — Telehealth: Payer: Self-pay | Admitting: *Deleted

## 2015-07-26 ENCOUNTER — Other Ambulatory Visit: Payer: Self-pay

## 2015-07-26 DIAGNOSIS — Z79899 Other long term (current) drug therapy: Secondary | ICD-10-CM | POA: Insufficient documentation

## 2015-07-26 DIAGNOSIS — I10 Essential (primary) hypertension: Secondary | ICD-10-CM | POA: Insufficient documentation

## 2015-07-26 DIAGNOSIS — K3184 Gastroparesis: Secondary | ICD-10-CM

## 2015-07-26 DIAGNOSIS — R1084 Generalized abdominal pain: Secondary | ICD-10-CM | POA: Diagnosis not present

## 2015-07-26 DIAGNOSIS — R112 Nausea with vomiting, unspecified: Secondary | ICD-10-CM | POA: Diagnosis present

## 2015-07-26 DIAGNOSIS — E109 Type 1 diabetes mellitus without complications: Secondary | ICD-10-CM | POA: Insufficient documentation

## 2015-07-26 DIAGNOSIS — F17210 Nicotine dependence, cigarettes, uncomplicated: Secondary | ICD-10-CM | POA: Insufficient documentation

## 2015-07-26 LAB — COMPREHENSIVE METABOLIC PANEL
ALT: 14 U/L (ref 14–54)
ANION GAP: 13 (ref 5–15)
AST: 24 U/L (ref 15–41)
Albumin: 4.3 g/dL (ref 3.5–5.0)
Alkaline Phosphatase: 48 U/L (ref 38–126)
BILIRUBIN TOTAL: 1 mg/dL (ref 0.3–1.2)
BUN: 7 mg/dL (ref 6–20)
CO2: 23 mmol/L (ref 22–32)
Calcium: 9.7 mg/dL (ref 8.9–10.3)
Chloride: 102 mmol/L (ref 101–111)
Creatinine, Ser: 0.99 mg/dL (ref 0.44–1.00)
GFR calc Af Amer: >60 mL/min (ref >60)
Glucose, Bld: 247 mg/dL — ABNORMAL HIGH (ref 65–99)
POTASSIUM: 3.7 mmol/L (ref 3.5–5.1)
SODIUM: 138 mmol/L (ref 135–145)
TOTAL PROTEIN: 7.8 g/dL (ref 6.5–8.1)

## 2015-07-26 LAB — URINE MICROSCOPIC-ADD ON
RBC / HPF: NONE SEEN RBC/hpf (ref 0–5)
WBC UA: NONE SEEN WBC/hpf (ref 0–5)

## 2015-07-26 LAB — I-STAT BETA HCG BLOOD, ED (MC, WL, AP ONLY)

## 2015-07-26 LAB — CBC
HCT: 40 % (ref 36.0–46.0)
HEMOGLOBIN: 12.9 g/dL (ref 12.0–15.0)
MCH: 28.7 pg (ref 26.0–34.0)
MCHC: 32.3 g/dL (ref 30.0–36.0)
MCV: 89.1 fL (ref 78.0–100.0)
Platelets: 267 10*3/uL (ref 150–400)
RBC: 4.49 MIL/uL (ref 3.87–5.11)
RDW: 15.1 % (ref 11.5–15.5)
WBC: 6.5 10*3/uL (ref 4.0–10.5)

## 2015-07-26 LAB — URINALYSIS, ROUTINE W REFLEX MICROSCOPIC
Bilirubin Urine: NEGATIVE
HGB URINE DIPSTICK: NEGATIVE
Ketones, ur: 40 mg/dL — AB
LEUKOCYTES UA: NEGATIVE
Nitrite: NEGATIVE
Protein, ur: NEGATIVE mg/dL
SPECIFIC GRAVITY, URINE: 1.028 (ref 1.005–1.030)
pH: 6.5 (ref 5.0–8.0)

## 2015-07-26 LAB — CBG MONITORING, ED
GLUCOSE-CAPILLARY: 311 mg/dL — AB (ref 65–99)
Glucose-Capillary: 226 mg/dL — ABNORMAL HIGH (ref 65–99)

## 2015-07-26 LAB — LIPASE, BLOOD: LIPASE: 13 U/L (ref 11–51)

## 2015-07-26 MED ORDER — ONDANSETRON 4 MG PO TBDP: 4.0000 mg | ORAL_TABLET | Freq: Three times a day (TID) | ORAL | Status: DC | PRN

## 2015-07-26 MED ORDER — SODIUM CHLORIDE 0.9 % IV BOLUS (SEPSIS)
1000.0000 mL | Freq: Once | INTRAVENOUS | Status: AC
  Administered 2015-07-26: 1000 mL via INTRAVENOUS

## 2015-07-26 MED ORDER — POTASSIUM CHLORIDE 10 MEQ/100ML IV SOLN
10.0000 meq | Freq: Once | INTRAVENOUS | Status: AC
  Administered 2015-07-26: 10 meq via INTRAVENOUS
  Filled 2015-07-26: qty 100

## 2015-07-26 MED ORDER — HYDROMORPHONE HCL 1 MG/ML IJ SOLN
1.0000 mg | Freq: Once | INTRAMUSCULAR | Status: AC
  Administered 2015-07-26: 1 mg via INTRAVENOUS

## 2015-07-26 MED ORDER — METOCLOPRAMIDE HCL 10 MG PO TABS: 10.0000 mg | ORAL_TABLET | Freq: Three times a day (TID) | ORAL | Status: DC

## 2015-07-26 MED ORDER — HYDROMORPHONE HCL 1 MG/ML IJ SOLN
1.0000 mg | Freq: Once | INTRAMUSCULAR | Status: DC
  Filled 2015-07-26: qty 1

## 2015-07-26 MED ORDER — ONDANSETRON HCL 4 MG/2ML IJ SOLN
4.0000 mg | Freq: Once | INTRAMUSCULAR | Status: AC
  Administered 2015-07-26: 4 mg via INTRAVENOUS
  Filled 2015-07-26: qty 2

## 2015-07-26 MED ORDER — PROMETHAZINE HCL 25 MG/ML IJ SOLN
12.5000 mg | INTRAMUSCULAR | Status: AC
  Administered 2015-07-26 (×2): 12.5 mg via INTRAVENOUS
  Filled 2015-07-26 (×2): qty 1

## 2015-07-26 MED ORDER — KETOROLAC TROMETHAMINE 15 MG/ML IJ SOLN: 15.0000 mg | Freq: Once | INTRAMUSCULAR | Status: DC

## 2015-07-26 MED ORDER — INSULIN ASPART 100 UNIT/ML ~~LOC~~ SOLN
7.0000 [IU] | Freq: Once | SUBCUTANEOUS | Status: AC
  Administered 2015-07-26: 7 [IU] via INTRAVENOUS
  Filled 2015-07-26: qty 1

## 2015-07-26 MED ORDER — INSULIN ASPART 100 UNIT/ML ~~LOC~~ SOLN: 7.0000 [IU] | Freq: Once | SUBCUTANEOUS | Status: DC

## 2015-07-26 MED ORDER — METOCLOPRAMIDE HCL 5 MG/ML IJ SOLN: 10.0000 mg | Freq: Once | INTRAMUSCULAR | Status: DC

## 2015-07-26 NOTE — ED Notes (Addendum)
She c/o 2 day history n/v and abd pain. she took 2 zofran at home with no relief of symptoms. She denies fevers, chills, bowel/bladder changes. She is alert and breathing easily
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
## 2015-07-26 NOTE — ED Notes (Signed)
Pt given ginger ale, passed PO challenge.

## 2015-07-26 NOTE — ED Notes (Signed)
Pt CBG, 226. Nurse was notified.

## 2015-07-26 NOTE — ED Provider Notes (Signed)
CSN: [id number]     Arrival date & time 07/26/15  1112 History   None    Chief Complaint  Patient presents with  . Emesis   HPI Type I diabetic patient presents with concerns of generalized abdominal pain, nausea and vomiting for the last 2 days. She states she's been unable to keep much down secondary to vomiting. She states in 2 of Zofran with no relief of symptoms. She had any fevers or chills and initially did not have any bowel or bladder changes until eating in the triage, and now she is having some diarrhea. She denies any shortness of breath or chest pain but does endorse a cough. She does have frequent bronchitis however and does not state that this is unusual for her. She does not have any significant abdominal surgeries except for a tubal ligation. Patient states that she is vomiting multiple times an hour. The contents are brown but no gross blood and no coffee-ground appearance or green in appearance. She denies any vaginal symptoms but hasn't recently treated with Flagyl. She states that she did drink some alcohol on Friday but did not initiate her Flagyl treatment until Sunday. Review of records reveals that patient has received almost a yearly CT abdomen and pelvis, most of them have been negative except for once in which she had a abscess. Patient states that this morning she checked her sugar and did not take her insulin as it was low. Patient has not had any suspect foods and no one around her has been ill.   Past Medical History  Diagnosis Date  . Hyperlipidemia   . Hypertension   . Tobacco abuse   . Incarceration     Was in jail for selling drugs - out Oct 2010.  [name]. Renal disorder   . Type I diabetes mellitus (HCC)     A1c > 11. Patient does not check BG or take insulin regularly.  Dx at age 89.  . Chronic bronchitis (HCC)     "get it q yr" (08/21/2012)  . Exertional shortness of breath   . GERD (gastroesophageal reflux disease)   . WUJWJXBJ(478.2Headache(784.0)     "weekly" (08/21/2012)   . Arthritis     "legs and hands" (08/21/2012)   Past Surgical History  Procedure Laterality Date  . Tubal ligation  2009   History reviewed. No pertinent family history. Social History  Substance Use Topics  . Smoking status: Current Every Day Smoker -- 0.33 packs/day for 14 years    Types: Cigarettes  . Smokeless tobacco: Never Used  . Alcohol Use: 0.0 oz/week     Comment: 08/21/2012 "might drink 1-2 glasses of liquor once/month"   OB History    No data available     Review of Systems  Constitutional: Negative for fever.  Allergic/Immunologic: Negative for immunocompromised state.  All other systems reviewed and are negative.     Allergies  Levonorgestrel-ethinyl estrad  Home Medications   Prior to Admission medications   Medication Sig Start Date End Date Taking? Authorizing Provider  Blood Glucose Monitoring Suppl (ACCU-CHEK AVIVA) device 1 each by Other route 4 (four) times daily - after meals and at bedtime. Use as instructed 03/25/15   Abram SanderElena M Adamo, MD  fluconazole (DIFLUCAN) 150 MG tablet Take one tablet by mouth on Day 1. Repeat dose 2nd tablet on Day 3. Patient not taking: Reported on 07/26/2015 05/20/15   Smitty CordsAlexander J Karamalegos, DO  glucose blood (ACCU-CHEK AVIVA) test strip 1 each by Other  route 4 (four) times daily - after meals and at bedtime. Use as instructed 03/25/15   Abram SanderElena M Adamo, MD  HYDROcodone-acetaminophen (NORCO/VICODIN) 5-325 MG per tablet Take 2 tablets by mouth every 4 (four) hours as needed for moderate pain or severe pain. Patient not taking: Reported on 01/25/2015 09/17/13   Melene Planan Floyd, DO  insulin aspart (NOVOLOG) 100 UNIT/ML injection Inject 5-6 Units into the skin 3 (three) times daily as needed. For CBG over 180 09/25/12   Lonia SkinnerStephanie E Losq, MD  insulin aspart protamine- aspart (NOVOLOG MIX 70/30) (70-30) 100 UNIT/ML injection Inject 25-28 Units into the skin daily with breakfast.    Historical Provider, MD  insulin aspart protamine- aspart  (NOVOLOG MIX 70/30) (70-30) 100 UNIT/ML injection Inject 0.25 mLs (25 Units total) into the skin daily with breakfast. 01/26/15   Ace GinsSerena Y Sam, PA-C  insulin glargine (LANTUS) 100 UNIT/ML injection Inject 0.3 mLs (30 Units total) into the skin at bedtime. Patient not taking: Reported on 01/25/2015 09/25/12   Lonia SkinnerStephanie E Losq, MD  insulin NPH-regular Human (NOVOLIN 70/30) (70-30) 100 UNIT/ML injection Inject 20-27 Units into the skin 2 (two) times daily. 27 units in the morning before breakfast and 20 units in the evening before supper    Historical Provider, MD  Lancet Devices [facility](ACCU-CHEK SOFTCLIX) lancets 1 each by Other route 4 (four) times daily - after meals and at bedtime. Use as instructed 03/25/15   Abram SanderElena M Adamo, MD  lisinopril (PRINIVIL,ZESTRIL) 10 MG tablet Take 1 tablet (10 mg total) by mouth daily. Patient not taking: Reported on 01/25/2015 09/25/12   Lonia SkinnerStephanie E Losq, MD  metoCLOPramide (REGLAN) 10 MG tablet Take 1 tablet (10 mg total) by mouth 3 (three) times daily before meals. 07/26/15   Sidney AceAlison Charruf Chynna Buerkle, MD  metroNIDAZOLE (FLAGYL) 500 MG tablet Take 1 tablet (500 mg total) by mouth 2 (two) times daily. 07/15/15   Bonney AidAlyssa A Haney, MD  Multiple Vitamin (MULTIVITAMIN WITH MINERALS) TABS Take 1 tablet by mouth every other day.     Historical Provider, MD  ondansetron (ZOFRAN ODT) 4 MG disintegrating tablet Take 1 tablet (4 mg total) by mouth every 8 (eight) hours as needed for nausea or vomiting. 07/26/15   Sidney AceAlison Charruf Izora Benn, MD  pantoprazole (PROTONIX) 40 MG tablet Take 40 mg by mouth daily.    Historical Provider, MD  permethrin (ACTICIN) 5 % cream Apply 1 application topically once. repeat in 1 week Patient not taking: Reported on 01/25/2015 05/08/14   Fredirick LatheKristy Acosta, MD  promethazine (PHENERGAN) 25 MG tablet Take 1 tablet (25 mg total) by mouth every 6 (six) hours as needed for nausea or vomiting. Patient not taking: Reported on 01/25/2015 09/17/13   Melene Planan Floyd, DO  promethazine (PHENERGAN)  25 MG tablet Take 1 tablet (25 mg total) by mouth every 6 (six) hours as needed for nausea or vomiting. 01/26/15   Ace GinsSerena Y Sam, PA-C  ranitidine (ZANTAC) 150 MG tablet Take 1 tablet (150 mg total) by mouth 2 (two) times daily. Patient not taking: Reported on 01/25/2015 09/17/13   Melene Planan Floyd, DO  valACYclovir (VALTREX) 1000 MG tablet Take 1 tablet (1,000 mg total) by mouth 2 (two) times daily. Patient not taking: Reported on 01/25/2015 09/05/13   Renee A Kuneff, DO   BP 188/98 mmHg  Pulse 97  Temp(Src) 97.4 F (36.3 C) (Oral)  Resp 20  SpO2 100% Physical Exam  Constitutional: She is oriented to person, place, and time. She appears well-developed and well-nourished. She appears distressed (  Vomiting multiple times, brown products with no gross blood or coffee-ground appearance).  HENT:  Head: Normocephalic and atraumatic.  Eyes: Conjunctivae are normal. Right eye exhibits no discharge. Left eye exhibits no discharge.  Neck: Normal range of motion. Neck supple.  Cardiovascular: Normal rate and regular rhythm.   Pulmonary/Chest: Effort normal and breath sounds normal. No respiratory distress.  Abdominal: Soft. Bowel sounds are normal. She exhibits no distension and no mass. There is tenderness (mild left upper quadrant tenderness). There is no rebound and no guarding.  Neg cvat Neg murphys sign  Musculoskeletal: She exhibits no edema.  Neurological: She is alert and oriented to person, place, and time.  Skin: Skin is warm. No rash noted.  Psychiatric: She has a normal mood and affect.  Nursing note and vitals reviewed.   ED Course  Procedures (including critical care time) Labs Review Labs Reviewed  COMPREHENSIVE METABOLIC PANEL - Abnormal; Notable for the following:    Glucose, Bld 247 (*)    All other components within normal limits  URINALYSIS, ROUTINE W REFLEX MICROSCOPIC (NOT AT [facility]) - Abnormal; Notable for the following:    APPearance HAZY (*)    Glucose, UA >1000 (*)     Ketones, ur 40 (*)    All other components within normal limits  URINE MICROSCOPIC-ADD ON - Abnormal; Notable for the following:    Squamous Epithelial / LPF TOO NUMEROUS TO COUNT (*)    Bacteria, UA RARE (*)    All other components within normal limits  CBG MONITORING, ED - Abnormal; Notable for the following:    Glucose-Capillary 311 (*)    All other components within normal limits  CBG MONITORING, ED - Abnormal; Notable for the following:    Glucose-Capillary 226 (*)    All other components within normal limits  CBC  LIPASE, BLOOD  I-STAT BETA HCG BLOOD, ED ([facility], [facility], [facility] ONLY)    Imaging Review No results found. I have personally reviewed and evaluated these images and lab results as part of my medical decision-making.   EKG Interpretation None      MDM   Final diagnoses:  Generalized abdominal pain    Doubt ruptured ulcer as abdomen without peritonitis and brown vomitus does not appear consistent with blood; no significant lipase elevation to suggest acute pancreatitis; doubt bowel obstruction as no significant constipation/hx of SBO/Crohns or other high risk condition. Doubt ischemic bowel as pain not out of proportion to exam and no significant past surgical history. Labs reviewed and not suggestive of hepatic/biliary emergency, AKI, or critical anemia. Given risk factors, despite lack of CP/SOB, EKG obtained and without evidence of ACS. POC pregnancy negative, doubt obstetrical complication.   Suspect gastroparesis or GI bug given history of diabetes with nausea vomiting and diarrhea.. Patient otherwise well-appearing after antiemetics, vital signs reassuring, abdominal exam very benign.  Patient reevaluated at approximately 4:30 PM, patient resting comfortably in bed states she feels a little better and is no longer nauseated, he is under control. We'll attempt to treat by mouth challenge.  Patient vomited after by mouth challenge. Will give insulin given elevated glucose  and another dose of Phenergan and Reglan.  6:30 PM. Patient states she feels remarkably better. Has been resting with no nausea for approximately one hour. By mouth challenge and tolerated without difficulty. Patient would like to follow up with gastroenterology. Resources provided. Follow up with PCP and return to the ER is elevated sugars above 250 with tachypnea, diaphoresis, fevers, or overall malaise. Patient  verbalizes understanding and agreement with plan area patient discharged in good condition with reglan TID AC and zofran prn (ODT).   Sidney Ace, MD 07/26/15 1842  Eber Hong, MD 07/27/15 [phone]

## 2015-07-26 NOTE — ED Notes (Signed)
This RN was asked to fluid challenge patient. Pt vomited all over the floor without po challenging.

## 2015-07-26 NOTE — ED Notes (Signed)
Pt requesting to speak to security, states that they helped her get out of her car earlier when arriving and they have her keys. Security has been paged and is at bedside

## 2015-07-26 NOTE — ED Provider Notes (Signed)
The pt is a 35 y/o femaly Type 1 Diabetic who states that she has had 2 days of abd pain and some nausea which worsened today culminating with vomiting some brown material - she was hypoglycemic this AM with sugar at 65 or so - took juice, then vomited that up.  Of note, the pt has hx of gastroparesis.  She has no urinary sx, no cough, no fever and no ha.  She has no changes in vision or balance - the abd pain comes and goes - it is LLQ.  On my exam she has no ttp, soft abd, mild tachycardia, normal mental status - dryish MM.  Labs show ketones in urine but no gap or lowered bicarb, doubt DKA, likely a ketosis from dehdyration  Fluids, Meds,  reeval - doubt need for imaging at this time.  I saw and evaluated the patient, reviewed the resident's note and I agree with the findings and plan.    Final diagnoses:  Generalized abdominal pain      Eber HongBrian Gallery, MD 07/27/15 1041

## 2015-07-26 NOTE — ED Notes (Signed)
Pt vomiting some brown tinged vomit

## 2015-07-26 NOTE — Telephone Encounter (Signed)
Patient requesting refill on reglan. [facility] [address]

## 2015-07-27 MED ORDER — METOCLOPRAMIDE HCL 10 MG PO TABS: 10.0000 mg | ORAL_TABLET | Freq: Three times a day (TID) | ORAL | Status: DC

## 2015-08-30 ENCOUNTER — Encounter (HOSPITAL_COMMUNITY): Payer: Self-pay | Admitting: Emergency Medicine

## 2015-08-30 DIAGNOSIS — I10 Essential (primary) hypertension: Secondary | ICD-10-CM | POA: Insufficient documentation

## 2015-08-30 DIAGNOSIS — E109 Type 1 diabetes mellitus without complications: Secondary | ICD-10-CM | POA: Diagnosis not present

## 2015-08-30 DIAGNOSIS — H6092 Unspecified otitis externa, left ear: Secondary | ICD-10-CM | POA: Insufficient documentation

## 2015-08-30 DIAGNOSIS — J0100 Acute maxillary sinusitis, unspecified: Secondary | ICD-10-CM | POA: Diagnosis not present

## 2015-08-30 DIAGNOSIS — F17210 Nicotine dependence, cigarettes, uncomplicated: Secondary | ICD-10-CM | POA: Insufficient documentation

## 2015-08-30 DIAGNOSIS — H9203 Otalgia, bilateral: Secondary | ICD-10-CM | POA: Diagnosis present

## 2015-08-30 DIAGNOSIS — Z79899 Other long term (current) drug therapy: Secondary | ICD-10-CM | POA: Diagnosis not present
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
## 2015-08-31 ENCOUNTER — Emergency Department (HOSPITAL_COMMUNITY)
Admission: EM | Admit: 2015-08-31 | Discharge: 2015-08-31 | Disposition: A | Discharge status: Home / Self Care | Payer: BLUE CROSS/BLUE SHIELD | Attending: Emergency Medicine | Admitting: Emergency Medicine

## 2015-08-31 DIAGNOSIS — J0100 Acute maxillary sinusitis, unspecified: Secondary | ICD-10-CM

## 2015-08-31 DIAGNOSIS — H6092 Unspecified otitis externa, left ear: Secondary | ICD-10-CM

## 2015-08-31 DIAGNOSIS — H9202 Otalgia, left ear: Secondary | ICD-10-CM

## 2015-08-31 MED ORDER — DOXYCYCLINE HYCLATE 100 MG PO TABS
100.0000 mg | ORAL_TABLET | Freq: Once | ORAL | Status: AC
  Administered 2015-08-31: 100 mg via ORAL
  Filled 2015-08-31: qty 1

## 2015-08-31 MED ORDER — DOXYCYCLINE HYCLATE 100 MG PO CAPS: 100.0000 mg | ORAL_CAPSULE | Freq: Two times a day (BID) | ORAL | 0 refills | Status: DC

## 2015-08-31 MED ORDER — CIPROFLOXACIN-DEXAMETHASONE 0.3-0.1 % OT SUSP
4.0000 [drp] | Freq: Once | OTIC | Status: AC
  Administered 2015-08-31: 4 [drp] via OTIC
  Filled 2015-08-31: qty 7.5

## 2015-08-31 MED ORDER — FLUTICASONE PROPIONATE 50 MCG/ACT NA SUSP: 2.0000 | Freq: Every day | NASAL | 0 refills | Status: DC

## 2015-08-31 NOTE — ED Notes (Addendum)
Unable to locate pt. Pt no longer in room. Pt cell phone still in bed.

## 2015-08-31 NOTE — ED Notes (Signed)
See PA notes for assessment

## 2015-08-31 NOTE — ED Provider Notes (Signed)
[facility] DEPT Provider Note   CSN: [id number] Arrival date & time: 08/30/15  2106  First Provider Contact:  First MD Initiated Contact with Patient 08/31/15 0102        History   Chief Complaint Chief Complaint  Patient presents with  . Otalgia    HPI [name] is a 35 y.o. female.  HPI   Pt is a 35 y/o female presents to the ER with complaint of bilateral ear pain for over a year that has worsened in the past 10-12 days.  She reports nasal congestion, sinus and facial pressure, bilateral ear "popping" with associated pain and decreased hearing.  She had similar ear irritation in the past and states that her PCP reported normal appearing ears, and then she was sent to an ENT, who also said ear drums were normal, but gave her cream for the outside of her ears.  She does not know the name of the medicine/cream.  These visits were roughly a year ago.  She reports using q-tips daily to try and make her ears feel better.  She experiences ear canal itching, stinging, and flaking skin and/or wax that comes out "in chunks."  Her left here is currently much more symptomatic with pain and feeling plugged.  She denies fevers, HA, neck pain, cough, CP, SOB, abdominal pain, N, V, D, rash.    Past Medical History:  Diagnosis Date  . Arthritis    "legs and hands" (08/21/2012)  . Chronic bronchitis (HCC)    "get it q yr" (08/21/2012)  . Exertional shortness of breath   . GERD (gastroesophageal reflux disease)   . BJYNWGNF(621.3)    "weekly" (08/21/2012)  . Hyperlipidemia   . Hypertension   . Incarceration    Was in jail for selling drugs - out Oct 2010.  [name] Renal disorder   . Tobacco abuse   . Type I diabetes mellitus (HCC)    A1c > 11. Patient does not check BG or take insulin regularly.  Dx at age 83.    Patient Active Problem List   Diagnosis Date Noted  . Unprotected sexual intercourse 07/15/2015  . Mild nonproliferative diabetic retinopathy (HCC) 07/25/2013  . Intractable  cyclical vomiting with nausea 07/24/2013  . Yeast vaginitis 07/21/2013  . Bilateral otitis externa 07/17/2013  . Skin abrasion 07/17/2013  . Contraception 04/16/2013  . Unspecified constipation 08/28/2012  . Hypokalemia 04/19/2012  . Anemia 04/19/2012  . Essential hypertension, benign 04/19/2012  . Rash and nonspecific skin eruption 04/19/2012  . Gastroparesis 03/11/2012  . TRIGGER FINGER, RIGHT THUMB 05/24/2009  . TOBACCO USER 11/07/2008  . Diabetes mellitus type 1, uncontrolled, insulin dependent (HCC) 04/02/2006  . HYPERCHOLESTEROLEMIA 03/29/2006    Past Surgical History:  Procedure Laterality Date  . TUBAL LIGATION  2009    OB History    No data available       Home Medications    Prior to Admission medications   Medication Sig Start Date End Date Taking? Authorizing Provider  Blood Glucose Monitoring Suppl (ACCU-CHEK AVIVA) device 1 each by Other route 4 (four) times daily - after meals and at bedtime. Use as instructed 03/25/15   Abram Sander, MD  doxycycline (VIBRAMYCIN) 100 MG capsule Take 1 capsule (100 mg total) by mouth 2 (two) times daily. 08/31/15   Danelle Berry, PA-C  fluconazole (DIFLUCAN) 150 MG tablet Take one tablet by mouth on Day 1. Repeat dose 2nd tablet on Day 3. Patient not taking: Reported on 07/26/2015 05/20/15  Netta Neat Karamalegos, DO  fluticasone (FLONASE) 50 MCG/ACT nasal spray Place 2 sprays into both nostrils daily. 08/31/15   Danelle Berry, PA-C  glucose blood (ACCU-CHEK AVIVA) test strip 1 each by Other route 4 (four) times daily - after meals and at bedtime. Use as instructed 03/25/15   Abram Sander, MD  HYDROcodone-acetaminophen (NORCO/VICODIN) 5-325 MG per tablet Take 2 tablets by mouth every 4 (four) hours as needed for moderate pain or severe pain. Patient not taking: Reported on 01/25/2015 09/17/13   Melene Plan, DO  insulin aspart (NOVOLOG) 100 UNIT/ML injection Inject 5-6 Units into the skin 3 (three) times daily as needed. For CBG over 180  09/25/12   Lonia Skinner, MD  insulin aspart protamine- aspart (NOVOLOG MIX 70/30) (70-30) 100 UNIT/ML injection Inject 25-28 Units into the skin daily with breakfast.    Historical Provider, MD  insulin aspart protamine- aspart (NOVOLOG MIX 70/30) (70-30) 100 UNIT/ML injection Inject 0.25 mLs (25 Units total) into the skin daily with breakfast. 01/26/15   Ace Gins Sam, PA-C  insulin glargine (LANTUS) 100 UNIT/ML injection Inject 0.3 mLs (30 Units total) into the skin at bedtime. Patient not taking: Reported on 01/25/2015 09/25/12   Lonia Skinner, MD  insulin NPH-regular Human (NOVOLIN 70/30) (70-30) 100 UNIT/ML injection Inject 20-27 Units into the skin 2 (two) times daily. 27 units in the morning before breakfast and 20 units in the evening before supper    Historical Provider, MD  Lancet Devices [facility]) lancets 1 each by Other route 4 (four) times daily - after meals and at bedtime. Use as instructed 03/25/15   Abram Sander, MD  lisinopril (PRINIVIL,ZESTRIL) 10 MG tablet Take 1 tablet (10 mg total) by mouth daily. Patient not taking: Reported on 01/25/2015 09/25/12   Lonia Skinner, MD  metoCLOPramide (REGLAN) 10 MG tablet Take 1 tablet (10 mg total) by mouth 3 (three) times daily before meals. 07/26/15   Sidney Ace, MD  metoCLOPramide (REGLAN) 10 MG tablet Take 1 tablet (10 mg total) by mouth 3 (three) times daily before meals. 07/27/15   Abram Sander, MD  metroNIDAZOLE (FLAGYL) 500 MG tablet Take 1 tablet (500 mg total) by mouth 2 (two) times daily. 07/15/15   Bonney Aid, MD  Multiple Vitamin (MULTIVITAMIN WITH MINERALS) TABS Take 1 tablet by mouth every other day.     Historical Provider, MD  ondansetron (ZOFRAN ODT) 4 MG disintegrating tablet Take 1 tablet (4 mg total) by mouth every 8 (eight) hours as needed for nausea or vomiting. 07/26/15   Sidney Ace, MD  pantoprazole (PROTONIX) 40 MG tablet Take 40 mg by mouth daily.    Historical Provider, MD    permethrin (ACTICIN) 5 % cream Apply 1 application topically once. repeat in 1 week Patient not taking: Reported on 01/25/2015 05/08/14   Fredirick Lathe, MD  promethazine (PHENERGAN) 25 MG tablet Take 1 tablet (25 mg total) by mouth every 6 (six) hours as needed for nausea or vomiting. Patient not taking: Reported on 01/25/2015 09/17/13   Melene Plan, DO  promethazine (PHENERGAN) 25 MG tablet Take 1 tablet (25 mg total) by mouth every 6 (six) hours as needed for nausea or vomiting. 01/26/15   Ace Gins Sam, PA-C  ranitidine (ZANTAC) 150 MG tablet Take 1 tablet (150 mg total) by mouth 2 (two) times daily. Patient not taking: Reported on 01/25/2015 09/17/13   Melene Plan, DO  valACYclovir (VALTREX) 1000 MG tablet Take 1 tablet (1,000  mg total) by mouth 2 (two) times daily. Patient not taking: Reported on 01/25/2015 09/05/13   Natalia Leatherwood, DO    Family History No family history on file.  Social History Social History  Substance Use Topics  . Smoking status: Current Every Day Smoker    Packs/day: 0.33    Years: 14.00    Types: Cigarettes  . Smokeless tobacco: Never Used  . Alcohol use 0.0 oz/week     Comment: 08/21/2012 "might drink 1-2 glasses of liquor once/month"     Allergies   Levonorgestrel-ethinyl estrad   Review of Systems Review of Systems  All other systems reviewed and are negative.    Physical Exam Updated Vital Signs BP 131/95 (BP Location: Right Arm)   Pulse 83   Temp 98.7 F (37.1 C) (Oral)   Resp 16   LMP 07/31/2015 (Approximate)   SpO2 99%   Physical Exam  Constitutional: She is oriented to person, place, and time. She appears well-developed and well-nourished. No distress.  HENT:  Head: Normocephalic and atraumatic.  Right Ear: Hearing, tympanic membrane and external ear normal. No mastoid tenderness. No middle ear effusion.  Left Ear: External ear normal. There is drainage, swelling and tenderness. No mastoid tenderness.  No middle ear effusion.  Nose:  Mucosal edema and rhinorrhea present. No nasal deformity or nasal septal hematoma. Right sinus exhibits maxillary sinus tenderness. Left sinus exhibits maxillary sinus tenderness.  Mouth/Throat: Uvula is midline, oropharynx is clear and moist and mucous membranes are normal. No trismus in the jaw. No uvula swelling. No oropharyngeal exudate, posterior oropharyngeal edema or posterior oropharyngeal erythema.  Left external auditory canal coated/covered with white to yellow purulence, cannot visualize wall to assess edema or erythema.  TM partially occluded.  No tragus tenderness, no mastoid tenderness or erythema.  Nasal mucosa edematous and erythematous with thick yellow to green discharge.    Eyes: Conjunctivae, EOM and lids are normal. Pupils are equal, round, and reactive to light. Right eye exhibits no discharge. Left eye exhibits no discharge. No scleral icterus.  Neck: Normal range of motion and full passive range of motion without pain. Neck supple. No JVD present. No tracheal deviation present.  Cardiovascular: Normal rate and regular rhythm.   Pulmonary/Chest: Effort normal and breath sounds normal. No stridor. No respiratory distress.  Musculoskeletal: Normal range of motion. She exhibits no edema.  Lymphadenopathy:    She has no cervical adenopathy.  Neurological: She is alert and oriented to person, place, and time. She exhibits normal muscle tone. Coordination normal.  Skin: Skin is warm and dry. No rash noted. She is not diaphoretic. No erythema. No pallor.  Psychiatric: She has a normal mood and affect. Her behavior is normal. Judgment and thought content normal.  Nursing note and vitals reviewed.    ED Treatments / Results  Labs (all labs ordered are listed, but only abnormal results are displayed) Labs Reviewed - No data to display  EKG  EKG Interpretation None       Radiology No results found.  Procedures Procedures (including critical care time)  Medications  Ordered in ED Medications  ciprofloxacin-dexamethasone (CIPRODEX) 0.3-0.1 % otic suspension 4 drop (4 drops Left Ear Given 08/31/15 0216)  doxycycline (VIBRA-TABS) tablet 100 mg (100 mg Oral Given 08/31/15 0216)     Initial Impression / Assessment and Plan / ED Course  I have reviewed the triage vital signs and the nursing notes.  Pertinent labs & imaging results that were available during my care of  the patient were reviewed by me and considered in my medical decision making (see chart for details).  Clinical Course   Pt with ear pain and sinusitis x 12 days.  Left EAC is abnormal in appearing, with discharge or moist copious ear wax, able to remove a fair amount w/o much pain, bilateral TM's are normal appearing, no otitis media.  Pt appears to have severe sinusitis which is likely causing some Eustacian tube dysfunction, and I suspect pt's frequent, vigorous instrumentation of ear canals are contributory to her acute and chronic ear problems.  Will cover with ciprodex externally, treat sinusitis with doxy and flonase, and send pt to follow up with PCP and/or ENT. Pt otherwise well appearing, safe to discharge home.     Final Clinical Impressions(s) / ED Diagnoses   Final diagnoses:  Acute maxillary sinusitis, recurrence not specified  Otalgia of left ear  Otitis externa, left    New Prescriptions Discharge Medication List as of 08/31/2015  1:36 AM    START taking these medications   Details  fluticasone (FLONASE) 50 MCG/ACT nasal spray Place 2 sprays into both nostrils daily., Starting Tue 08/31/2015, Print      Doxycycline 100 mg PO BID x 10 d for sinusitis   Danelle Berry, PA-C 09/06/15 0449    Layla Maw Ward, DO 09/06/15 0451

## 2015-08-31 NOTE — ED Notes (Signed)
Pt ambulatory back into her room.

## 2015-09-14 ENCOUNTER — Ambulatory Visit (INDEPENDENT_AMBULATORY_CARE_PROVIDER_SITE_OTHER): Payer: BLUE CROSS/BLUE SHIELD | Admitting: Internal Medicine

## 2015-09-14 ENCOUNTER — Encounter: Payer: Self-pay | Admitting: Internal Medicine

## 2015-09-14 VITALS — BP 100/55 | HR 81 | Temp 98.6°F | Ht 68.0 in | Wt 156.0 lb

## 2015-09-14 DIAGNOSIS — H60392 Other infective otitis externa, left ear: Secondary | ICD-10-CM | POA: Diagnosis not present

## 2015-09-14 DIAGNOSIS — Z111 Encounter for screening for respiratory tuberculosis: Secondary | ICD-10-CM | POA: Diagnosis not present

## 2015-09-14 DIAGNOSIS — J309 Allergic rhinitis, unspecified: Secondary | ICD-10-CM

## 2015-09-14 MED ORDER — FLUCONAZOLE 50 MG PO TABS: 100.0000 mg | ORAL_TABLET | Freq: Every day | ORAL | Status: DC

## 2015-09-14 MED ORDER — CETIRIZINE HCL 10 MG PO TABS: 10.0000 mg | ORAL_TABLET | Freq: Every day | ORAL | 1 refills | Status: DC | PRN

## 2015-09-14 MED ORDER — FLUCONAZOLE 100 MG PO TABS: 100.0000 mg | ORAL_TABLET | Freq: Every day | ORAL | 0 refills | Status: AC

## 2015-09-14 NOTE — Progress Notes (Signed)
[facility] Progress Note  Subjective:  [name] is a 34-y/o female with T1DM who presents for follow-up of ear fullness. Also requested PPD for work.   L ear fullness and pain: - has been having symptoms for about 1 month - was evaluated in the ED 08/1015 and thought to have a sinusitis causing Eustachian tube dysfunction with otitis externa and was given doxycycline, flonase and ciprodex drops - says sinuses are no longer bothering her but that her left ear still feels very full and that she continues to be able to remove white chunks of ear discharge when she puts her finger in her ear - is on last day of doxycycline; does not feel that ear drops have improved her symptoms - reports decreased hearing on left - is taking nyquil cold and flu - says she had been seen by ENT in the past for dryness around her ears and had been given cream - moved because of mold in the bathroom of her last house that was not being repaired in a timely fashion - Has been swimming about 3 times this summer ROS: Occasional cough, no fevers or chills  Social: Current smoker (1/4 PPD)  Objective: Blood pressure (!) 100/55, pulse 81, temperature 98.6 F (37 C), temperature source Oral, height 5\' 8"  (1.727 m), weight 156 lb (70.8 kg), last menstrual period 08/31/2015. Constitutional: Well-appearing female, in NAD HENT: R ear canal and TM normal appearing, L ear canal with narrowing of ear canal and thick cheesy white debris, no pain with opening or closing jaw; could not fully visualize L TM after loop debridement; no pain with pulling outer ear but discomfort with instrumentation Lymph: Mild postauricular lymphadenopathy L > R Neurological: AOx3, no focal deficits. Skin: Skin is warm and dry. No rash noted. No erythema.  Psychiatric: Normal mood and affect.  Vitals reviewed  Last hgb A1c 03/25/15: 11.2  Assessment/Plan: Otitis, externa, infective - Thick, off-white ear debris and  discharge in setting of recent antibiotic ear drop use c/w otocomycosis  - Could not visualize TM to r/o perforated ear drum. - Placed urgent referral to ENT for more thorough ear cleaning; would prescribe clotrimazole drops once TM visualized  - Tried to prescribe tinactin otic drops (safe if TM perforated) but not available at local pharmacies - Prescribed diflucan 100 mg daily x 7 days - No fever or severe pain to suggest malignant otitis externa but patient is at increased risk with poorly controlled diabetes; provided strict return precautions  PPD administered. To return in 2-3 days for reading.   Follow-up at earliest convenience to discuss diabetes.  Dani GobbleHillary Fitzgerald, MD [facility], PGY-2

## 2015-09-14 NOTE — Patient Instructions (Signed)
Ms. [name],  I will refer you to ENT for a full clean out of your ear. It does look like a fungal infection. I will call in your ear drop and let you know which pharmacy can prescribe it. It will be tinactin 4 drops 4 times daily for 2 weeks.  Please make an appointment in about 10 days for check up of healing.   Best, Dr. Sampson GoonFitzgerald

## 2015-09-15 ENCOUNTER — Telehealth: Payer: Self-pay | Admitting: *Deleted

## 2015-09-15 DIAGNOSIS — H6242 Otitis externa in other diseases classified elsewhere, left ear: Secondary | ICD-10-CM

## 2015-09-15 DIAGNOSIS — B369 Superficial mycosis, unspecified: Secondary | ICD-10-CM | POA: Insufficient documentation

## 2015-09-15 NOTE — Assessment & Plan Note (Addendum)
-   Thick, off-white ear debris and discharge in setting of recent antibiotic ear drop use c/w otocomycosis  - Could not visualize TM to r/o perforated ear drum. - Placed urgent referral to ENT for more thorough ear cleaning; would prescribe clotrimazole drops once TM visualized  - Tried to prescribe tinactin otic drops (safe if TM perforated) but not available at local pharmacies - Prescribed diflucan 100 mg daily x 7 days - No fever or severe pain to suggest malignant otitis externa but patient is at increased risk with poorly controlled diabetes; provided strict return precautions

## 2015-09-15 NOTE — Telephone Encounter (Signed)
Received call from [facility] stating they received a referral for patient earlier today.  Pt referred to Dr. Jenne P[facility]Bates and [facility]  Does patient need referral at [facility] or [facility].  Please give them a call at [phone].  They will hold on to referral until they hear back from [facility].  Clovis PuMartin, Tamika L, RN

## 2015-09-16 ENCOUNTER — Encounter: Payer: Self-pay | Admitting: *Deleted

## 2015-09-16 ENCOUNTER — Ambulatory Visit (INDEPENDENT_AMBULATORY_CARE_PROVIDER_SITE_OTHER): Payer: BLUE CROSS/BLUE SHIELD | Admitting: *Deleted

## 2015-09-16 DIAGNOSIS — Z7689 Persons encountering health services in other specified circumstances: Secondary | ICD-10-CM

## 2015-09-16 DIAGNOSIS — Z111 Encounter for screening for respiratory tuberculosis: Secondary | ICD-10-CM

## 2015-09-16 LAB — TB SKIN TEST
Induration: 0 mm
TB SKIN TEST: NEGATIVE

## 2015-09-16 NOTE — Telephone Encounter (Signed)
Referral re-faxed to [facility].  Clovis PuMartin, Tamika L, RN

## 2015-09-16 NOTE — Progress Notes (Signed)
   PPD Reading Note PPD read and results entered in EpicCare. Result: 0 mm induration. Interpretation: Negative If test not read within 48-72 hours of initial placement, patient advised to repeat in other arm 1-3 weeks after this test. Allergic reaction: no  Takashi Korol L, RN  

## 2015-09-16 NOTE — Telephone Encounter (Signed)
Referral was to [facility], not [facility]. There must have been a mix up while I was faxing two days worth of referrals. I did have a referral for [facility] but it was for another patient. I will give them a call later this morning to make sure they received the appropriate referral. Could you re-fax referral to for pt to [facility], it is in the fax pile from 09/15/15. Thank you! Lashon Hillier K

## 2015-09-24 ENCOUNTER — Other Ambulatory Visit: Payer: Self-pay | Admitting: *Deleted

## 2015-09-24 NOTE — Telephone Encounter (Signed)
Patient is requesting refill on Valtrex due to current out outbreak.  [name], [name] L, RN

## 2015-09-24 NOTE — Telephone Encounter (Signed)
Patient requesting a refill on Valtrex.  She is currently having an outbreak.  She is requesting refill ASAP.  Clovis PuMartin, Tamika L, RN

## 2015-09-26 MED ORDER — VALACYCLOVIR HCL 1 G PO TABS
1000.0000 mg | ORAL_TABLET | Freq: Two times a day (BID) | ORAL | 0 refills | Status: DC
Start: 2015-09-26 — End: 2016-09-13

## 2015-11-16 ENCOUNTER — Encounter: Payer: Self-pay | Admitting: Family Medicine

## 2015-11-16 ENCOUNTER — Other Ambulatory Visit (HOSPITAL_COMMUNITY)
Admission: RE | Admit: 2015-11-16 | Discharge: 2015-11-16 | Disposition: A | Discharge status: Home / Self Care | Payer: BLUE CROSS/BLUE SHIELD | Source: Ambulatory Visit | Attending: Family Medicine | Admitting: Family Medicine

## 2015-11-16 ENCOUNTER — Ambulatory Visit (INDEPENDENT_AMBULATORY_CARE_PROVIDER_SITE_OTHER): Payer: Self-pay | Admitting: Family Medicine

## 2015-11-16 VITALS — BP 143/90 | HR 82 | Temp 97.9°F | Wt 165.0 lb

## 2015-11-16 DIAGNOSIS — H6240 Otitis externa in other diseases classified elsewhere, unspecified ear: Secondary | ICD-10-CM

## 2015-11-16 DIAGNOSIS — N898 Other specified noninflammatory disorders of vagina: Secondary | ICD-10-CM

## 2015-11-16 DIAGNOSIS — H6242 Otitis externa in other diseases classified elsewhere, left ear: Secondary | ICD-10-CM

## 2015-11-16 DIAGNOSIS — Z113 Encounter for screening for infections with a predominantly sexual mode of transmission: Secondary | ICD-10-CM | POA: Insufficient documentation

## 2015-11-16 DIAGNOSIS — B369 Superficial mycosis, unspecified: Secondary | ICD-10-CM

## 2015-11-16 DIAGNOSIS — Z23 Encounter for immunization: Secondary | ICD-10-CM

## 2015-11-16 LAB — POCT WET PREP (WET MOUNT)
CLUE CELLS WET PREP WHIFF POC: NEGATIVE
Trichomonas Wet Prep HPF POC: ABSENT

## 2015-11-16 MED ORDER — CLOTRIMAZOLE 1 % EX SOLN: 1.0000 "application " | Freq: Two times a day (BID) | CUTANEOUS | 0 refills | Status: AC

## 2015-11-16 MED ORDER — FLUCONAZOLE 150 MG PO TABS: 150.0000 mg | ORAL_TABLET | Freq: Once | ORAL | 0 refills | Status: AC

## 2015-11-16 NOTE — Patient Instructions (Addendum)
It was a pleasure to meet you today. Please see below to review our plan for today's visit.  1. I have called in a prescription for Clotrimazole, an antifungal which you will apply to your left ear twice per day for 14 days. [facility]. 2. You will be notified of your results. 3. Please return in 1 month for re-evaluation of your ear.  4. You need an appointment scheduled for diabetes follow up as this is uncontrolled. 5. You are up to date on your flu shot.  Please call the clinic at [phone] if your symptoms worsen or you have any concerns. It was my pleasure to see you. -- Durward Parcelavid McMullen, DO [facility], PGY-1

## 2015-11-16 NOTE — Assessment & Plan Note (Signed)
Acute. Minimal improvement with irrigation and diflucan. Patient was seeing ENT but failed to f/u due to financial restrictions. Does not appear to be bacterial, restricted to middle ear canal.  - Clotrimazole 1% external solution BID to left ear for 14 days - [facility] - Patient to return in 1 month

## 2015-11-16 NOTE — Progress Notes (Signed)
Subjective:   Patient ID: Rachel CrosbyPatrice R Humphrey    DOB: 08/21/1980, 35 y.o. female   MRN: [id number]  CC: Left Ear Drainage and Vaginal Discharge  HPI: [name] is a 35 y.o. female who presents to clinic today chronic left ear drainage and acute onset vaginal discharge. Problems discussed today are as follows:  1. Left Ear Drainage: onset 08/31/15 was seen in ED with ear drainage with concerns for otitis externa. Originally treated with antibiotics without improvement. [facility] and was treated with diflucan for suspected otomycosis due to inability to get tinactin drops at local pharmacies. Patient was sent to ENT for irrigation and evaluation of ear. Patient says ear was cleared but drainage returned within 1 day and occluded hearing. Does have pain at tragus. Denies fevers, chills, headache, change in vision, jaw claudication, nausea, or change in appetite. Patent was scheduled for ENT f/u last week but cancelled due to inability to pay co-pay.  2. Vaginal Discharge: onset 2 days ago with white discharge without odor. Denies new sexual partners and uses condoms regularly. Denies itching, fevers, abdominal pain, nausea, vomiting, dysuria.   ROS: See HPI for pertinent ROS.  PMFSH: Pertinent past medical, surgical, family, and social history were reviewed and updated as appropriate. Smoking status reviewed.  Medications reviewed. Current Outpatient Prescriptions  Medication Sig Dispense Refill  . Blood Glucose Monitoring Suppl (ACCU-CHEK AVIVA) device 1 each by Other route 4 (four) times daily - after meals and at bedtime. Use as instructed 1 each 0  . cetirizine (ZYRTEC) 10 MG tablet Take 1 tablet (10 mg total) by mouth daily as needed for allergies. 30 tablet 1  . clotrimazole (LOTRIMIN) 1 % external solution Apply 1 application topically 2 (two) times daily. 10 mL 0  . fluconazole (DIFLUCAN) 150 MG tablet Take 1 tablet (150 mg total) by mouth once. Take second pill 3 days after  initial pill if symptoms persist. 2 tablet 0  . fluticasone (FLONASE) 50 MCG/ACT nasal spray Place 2 sprays into both nostrils daily. 16 g 0  . glucose blood (ACCU-CHEK AVIVA) test strip 1 each by Other route 4 (four) times daily - after meals and at bedtime. Use as instructed 100 each 12  . HYDROcodone-acetaminophen (NORCO/VICODIN) 5-325 MG per tablet Take 2 tablets by mouth every 4 (four) hours as needed for moderate pain or severe pain. (Patient not taking: Reported on 01/25/2015) 6 tablet 0  . insulin aspart (NOVOLOG) 100 UNIT/ML injection Inject 5-6 Units into the skin 3 (three) times daily as needed. For CBG over 180 1 vial 10  . insulin aspart protamine- aspart (NOVOLOG MIX 70/30) (70-30) 100 UNIT/ML injection Inject 25-28 Units into the skin daily with breakfast.    . insulin aspart protamine- aspart (NOVOLOG MIX 70/30) (70-30) 100 UNIT/ML injection Inject 0.25 mLs (25 Units total) into the skin daily with breakfast. 10 mL 0  . insulin glargine (LANTUS) 100 UNIT/ML injection Inject 0.3 mLs (30 Units total) into the skin at bedtime. (Patient not taking: Reported on 01/25/2015) 10 mL 6  . insulin NPH-regular Human (NOVOLIN 70/30) (70-30) 100 UNIT/ML injection Inject 20-27 Units into the skin 2 (two) times daily. 27 units in the morning before breakfast and 20 units in the evening before supper    . Lancet Devices (ACCU-CHEK SOFTCLIX) lancets 1 each by Other route 4 (four) times daily - after meals and at bedtime. Use as instructed 1 each 11  . lisinopril (PRINIVIL,ZESTRIL) 10 MG tablet Take 1 tablet (  10 mg total) by mouth daily. (Patient not taking: Reported on 01/25/2015) 90 tablet 3  . metoCLOPramide (REGLAN) 10 MG tablet Take 1 tablet (10 mg total) by mouth 3 (three) times daily before meals. 30 tablet 0  . metoCLOPramide (REGLAN) 10 MG tablet Take 1 tablet (10 mg total) by mouth 3 (three) times daily before meals. 90 tablet 3  . Multiple Vitamin (MULTIVITAMIN WITH MINERALS) TABS Take 1 tablet  by mouth every other day.     . ondansetron (ZOFRAN ODT) 4 MG disintegrating tablet Take 1 tablet (4 mg total) by mouth every 8 (eight) hours as needed for nausea or vomiting. 20 tablet 0  . pantoprazole (PROTONIX) 40 MG tablet Take 40 mg by mouth daily.    . promethazine (PHENERGAN) 25 MG tablet Take 1 tablet (25 mg total) by mouth every 6 (six) hours as needed for nausea or vomiting. 30 tablet 0  . valACYclovir (VALTREX) 1000 MG tablet Take 1 tablet (1,000 mg total) by mouth 2 (two) times daily. 20 tablet 0   No current facility-administered medications for this visit.     Objective:   BP (!) 143/90   Pulse 82   Temp 97.9 F (36.6 C) (Oral)   Wt 165 lb (74.8 kg)   LMP 11/02/2015   BMI 25.09 kg/m  Vitals and nursing note reviewed.  General: well nourished, well developed, in no acute distress with non-toxic appearance HEENT: normocephalic, atraumatic, moist mucous membranes, right patent ear canal with grey TM, left ear canal unable to visualize TM due to occulusion with white discharge bound to canal wall without hemorrhage, left ear sound diminished on exam Neck: supple, non-tender without lymphadenopathy CV: regular rate and rhythm without murmurs, rubs, or gallops Lungs: clear to auscultation bilaterally with normal work of breathing Abdomen: soft, non-tender, non-distended, no masses or organomegaly palpable, normoactive bowel sounds Skin: warm, dry, no rashes or lesions, cap refill < 2 seconds Extremities: warm and well perfused, normal tone GU: no external cysts or lesions, vaginal vault clear with clear mucous, no discharge appreciated, without odor on exam, no adnexal tenderness  Assessment & Plan:   Otomycosis of left ear Acute. Minimal improvement with irrigation and diflucan. Patient was seeing ENT but failed to f/u due to financial restrictions. Does not appear to be bacterial, restricted to middle ear canal.  - Clotrimazole 1% external solution BID to left ear for 14  days - Left ear irrigated at [facility] - Patient to return in 1 month  Orders Placed This Encounter  Procedures  . Flu Vaccine QUAD 36+ mos IM  . POCT Wet Prep [facility])   Meds ordered this encounter  Medications  . clotrimazole (LOTRIMIN) 1 % external solution    Sig: Apply 1 application topically 2 (two) times daily.    Dispense:  10 mL    Refill:  0  . fluconazole (DIFLUCAN) 150 MG tablet    Sig: Take 1 tablet (150 mg total) by mouth once. Take second pill 3 days after initial pill if symptoms persist.    Dispense:  2 tablet    Refill:  0    Durward Parcel, DO [facility], PGY-1 11/16/2015 8:54 PM

## 2015-11-17 LAB — GC/CHLAMYDIA PROBE AMP (~~LOC~~) NOT AT ARMC
Chlamydia: NEGATIVE
NEISSERIA GONORRHEA: NEGATIVE

## 2015-11-19 ENCOUNTER — Encounter: Payer: Self-pay | Admitting: Family Medicine

## 2015-11-24 MED FILL — FLUCONAZOLE 150 MG TABLET: 150 | 3 days supply | Qty: 2 | Fill #0

## 2015-12-27 ENCOUNTER — Other Ambulatory Visit: Payer: Self-pay | Admitting: Family Medicine

## 2015-12-27 DIAGNOSIS — K3184 Gastroparesis: Secondary | ICD-10-CM

## 2015-12-27 NOTE — Telephone Encounter (Signed)
Needs refill reglan.  [facility] outpt pharmacy.  pts insurance is not working now.  Please call pt at [phone] to let her know if it will be refilled

## 2015-12-28 MED ORDER — METOCLOPRAMIDE HCL 10 MG PO TABS
10.0000 mg | ORAL_TABLET | Freq: Three times a day (TID) | ORAL | 5 refills | Status: DC
Start: 2015-12-28 — End: 2017-02-10

## 2015-12-28 MED FILL — METOCLOPRAMIDE 10 MG TABLET: 10 | 30 days supply | Qty: 90 | Fill #0

## 2016-02-04 NOTE — Telephone Encounter (Signed)
This encounter was created in error - please disregard.

## 2016-02-28 ENCOUNTER — Telehealth: Payer: Self-pay | Admitting: Family Medicine

## 2016-03-27 ENCOUNTER — Ambulatory Visit: Payer: Self-pay | Admitting: Internal Medicine

## 2016-04-24 ENCOUNTER — Ambulatory Visit (INDEPENDENT_AMBULATORY_CARE_PROVIDER_SITE_OTHER): Payer: Self-pay | Admitting: Family Medicine

## 2016-04-24 ENCOUNTER — Encounter: Payer: Self-pay | Admitting: Family Medicine

## 2016-04-24 ENCOUNTER — Other Ambulatory Visit (HOSPITAL_COMMUNITY)
Admission: RE | Admit: 2016-04-24 | Discharge: 2016-04-24 | Disposition: A | Discharge status: Home / Self Care | Payer: Medicaid Other | Source: Ambulatory Visit | Attending: Family Medicine | Admitting: Family Medicine

## 2016-04-24 VITALS — BP 118/62 | HR 85 | Temp 98.0°F | Ht 68.0 in | Wt 173.6 lb

## 2016-04-24 DIAGNOSIS — E103299 Type 1 diabetes mellitus with mild nonproliferative diabetic retinopathy without macular edema, unspecified eye: Secondary | ICD-10-CM | POA: Insufficient documentation

## 2016-04-24 DIAGNOSIS — N898 Other specified noninflammatory disorders of vagina: Secondary | ICD-10-CM

## 2016-04-24 DIAGNOSIS — E1065 Type 1 diabetes mellitus with hyperglycemia: Secondary | ICD-10-CM | POA: Insufficient documentation

## 2016-04-24 LAB — POCT GLYCOSYLATED HEMOGLOBIN (HGB A1C): HEMOGLOBIN A1C: 9

## 2016-04-24 LAB — POCT WET PREP (WET MOUNT)
CLUE CELLS WET PREP WHIFF POC: POSITIVE
Trichomonas Wet Prep HPF POC: ABSENT

## 2016-04-24 MED ORDER — METRONIDAZOLE 500 MG PO TABS: 500.0000 mg | ORAL_TABLET | Freq: Two times a day (BID) | ORAL | 0 refills | Status: DC

## 2016-04-24 MED ORDER — FLUCONAZOLE 150 MG PO TABS: 150.0000 mg | ORAL_TABLET | Freq: Once | ORAL | 0 refills | Status: AC

## 2016-04-24 MED FILL — metroNIDAZOLE 500 MG TABS: 500 | 7 days supply | Qty: 14 | Fill #0

## 2016-04-24 NOTE — Addendum Note (Signed)
Addended by: Araceli BoucheUMLEY, [employer] N on: 04/24/2016 06:33 PM   Modules accepted: Orders

## 2016-04-24 NOTE — Progress Notes (Signed)
Subjective:     Patient ID: [name], female   DOB: 05/15/1980, 36 y.o.   MRN: [id number]  HPI [name] is a 36yo female presenting today for vaginal discharge. Has noted discharge for 2 months. Discharge is brown and has a fishy odor. Notes some vaginal itching. Denies vaginal pain. Last sexually active two months ago, used condoms, irritation started afterwards.  Denies fever. Denies dysuria, increased frequency (except for when sugar is very high, none recently), and increased urgency. LMP last week. History of BV and yeast infections, reports it feels like yeast infection.  Review of Systems Per HPI    Objective:   Physical Exam  Constitutional: She appears well-developed and well-nourished. No distress.  Cardiovascular: Normal rate.   Pulmonary/Chest: Effort normal. No respiratory distress.  Genitourinary:  Genitourinary Comments: No discharge noted. No adnexal tenderness. No cervical motion tenderness. No vaginal wall tenderness.   Psychiatric: She has a normal mood and affect. Her behavior is normal.       Assessment and Plan:     1. Vaginal Discharge Will obtain Wet Prep, GC/Chlamydia, RPR, and HIV. Wet prep consistent with BV. Metronidazole sent to pharmacy. Follow up with PCP for diabetes management--A1C obtained today.

## 2016-04-24 NOTE — Patient Instructions (Signed)
Thank you so much for coming to visit today! We obtained a Wet Prep and other tests today. If needed, I will send a medication to the pharmacy and notify you of the results.  Your A1C was 9 today. Please follow up with your PCP for diabetes management.  Dr. Caroleen Hammanumley

## 2016-04-25 LAB — HIV ANTIBODY (ROUTINE TESTING W REFLEX): HIV Screen 4th Generation wRfx: NONREACTIVE

## 2016-04-25 LAB — CERVICOVAGINAL ANCILLARY ONLY
CHLAMYDIA, DNA PROBE: NEGATIVE
NEISSERIA GONORRHEA: NEGATIVE

## 2016-04-25 LAB — RPR: RPR Ser Ql: NONREACTIVE

## 2016-04-25 MED FILL — FLUCONAZOLE 150 MG TABLET: 150 | 3 days supply | Qty: 2 | Fill #0

## 2016-05-02 ENCOUNTER — Telehealth: Payer: Self-pay | Admitting: Family Medicine

## 2016-05-02 NOTE — Telephone Encounter (Signed)
Pt informed of below. Zimmerman Rumple, April D, CMA  

## 2016-05-02 NOTE — Telephone Encounter (Signed)
Please let [name] know that the labs obtained at her last office visit were normal.

## 2016-06-26 ENCOUNTER — Inpatient Hospital Stay (HOSPITAL_COMMUNITY)
Admission: EM | Admit: 2016-06-26 | Discharge: 2016-06-28 | DRG: 639 | Disposition: A | Discharge status: Home / Self Care | Payer: Medicaid Other | Attending: Family Medicine | Admitting: Family Medicine

## 2016-06-26 ENCOUNTER — Encounter (HOSPITAL_COMMUNITY): Payer: Self-pay | Admitting: Internal Medicine

## 2016-06-26 DIAGNOSIS — Z794 Long term (current) use of insulin: Secondary | ICD-10-CM

## 2016-06-26 DIAGNOSIS — E1065 Type 1 diabetes mellitus with hyperglycemia: Secondary | ICD-10-CM

## 2016-06-26 DIAGNOSIS — E7800 Pure hypercholesterolemia, unspecified: Secondary | ICD-10-CM | POA: Diagnosis present

## 2016-06-26 DIAGNOSIS — F17200 Nicotine dependence, unspecified, uncomplicated: Secondary | ICD-10-CM

## 2016-06-26 DIAGNOSIS — E103299 Type 1 diabetes mellitus with mild nonproliferative diabetic retinopathy without macular edema, unspecified eye: Secondary | ICD-10-CM | POA: Diagnosis present

## 2016-06-26 DIAGNOSIS — E1110 Type 2 diabetes mellitus with ketoacidosis without coma: Secondary | ICD-10-CM | POA: Diagnosis present

## 2016-06-26 DIAGNOSIS — I10 Essential (primary) hypertension: Secondary | ICD-10-CM | POA: Diagnosis present

## 2016-06-26 DIAGNOSIS — E103599 Type 1 diabetes mellitus with proliferative diabetic retinopathy without macular edema, unspecified eye: Secondary | ICD-10-CM | POA: Diagnosis present

## 2016-06-26 DIAGNOSIS — J42 Unspecified chronic bronchitis: Secondary | ICD-10-CM | POA: Diagnosis present

## 2016-06-26 DIAGNOSIS — D72829 Elevated white blood cell count, unspecified: Secondary | ICD-10-CM | POA: Diagnosis present

## 2016-06-26 DIAGNOSIS — R112 Nausea with vomiting, unspecified: Secondary | ICD-10-CM

## 2016-06-26 DIAGNOSIS — K219 Gastro-esophageal reflux disease without esophagitis: Secondary | ICD-10-CM | POA: Diagnosis present

## 2016-06-26 DIAGNOSIS — Z888 Allergy status to other drugs, medicaments and biological substances status: Secondary | ICD-10-CM

## 2016-06-26 DIAGNOSIS — E1010 Type 1 diabetes mellitus with ketoacidosis without coma: Principal | ICD-10-CM | POA: Diagnosis present

## 2016-06-26 DIAGNOSIS — F17210 Nicotine dependence, cigarettes, uncomplicated: Secondary | ICD-10-CM | POA: Diagnosis present

## 2016-06-26 DIAGNOSIS — K3184 Gastroparesis: Secondary | ICD-10-CM | POA: Diagnosis present

## 2016-06-26 DIAGNOSIS — E785 Hyperlipidemia, unspecified: Secondary | ICD-10-CM | POA: Diagnosis present

## 2016-06-26 DIAGNOSIS — Z720 Tobacco use: Secondary | ICD-10-CM | POA: Diagnosis present

## 2016-06-26 DIAGNOSIS — R1115 Cyclical vomiting syndrome unrelated to migraine: Secondary | ICD-10-CM | POA: Diagnosis present

## 2016-06-26 LAB — CBC WITH DIFFERENTIAL/PLATELET
Basophils Absolute: 0 10*3/uL (ref 0.0–0.1)
Basophils Relative: 0 %
EOS PCT: 0 %
Eosinophils Absolute: 0.1 10*3/uL (ref 0.0–0.7)
HEMATOCRIT: 37.6 % (ref 36.0–46.0)
HEMOGLOBIN: 12.3 g/dL (ref 12.0–15.0)
LYMPHS ABS: 1.2 10*3/uL (ref 0.7–4.0)
LYMPHS PCT: 8 %
MCH: 29.4 pg (ref 26.0–34.0)
MCHC: 32.7 g/dL (ref 30.0–36.0)
MCV: 90 fL (ref 78.0–100.0)
Monocytes Absolute: 0.7 10*3/uL (ref 0.1–1.0)
Monocytes Relative: 4 %
NEUTROS PCT: 88 %
Neutro Abs: 14.3 10*3/uL — ABNORMAL HIGH (ref 1.7–7.7)
Platelets: 347 10*3/uL (ref 150–400)
RBC: 4.18 MIL/uL (ref 3.87–5.11)
RDW: 15.3 % (ref 11.5–15.5)
WBC: 16.4 10*3/uL — AB (ref 4.0–10.5)

## 2016-06-26 LAB — COMPREHENSIVE METABOLIC PANEL
ALT: 18 U/L (ref 14–54)
AST: 34 U/L (ref 15–41)
Albumin: 5 g/dL (ref 3.5–5.0)
Alkaline Phosphatase: 60 U/L (ref 38–126)
Anion gap: 16 — ABNORMAL HIGH (ref 5–15)
BUN: 13 mg/dL (ref 6–20)
CO2: 23 mmol/L (ref 22–32)
CREATININE: 1.2 mg/dL — AB (ref 0.44–1.00)
Calcium: 9.9 mg/dL (ref 8.9–10.3)
Chloride: 100 mmol/L — ABNORMAL LOW (ref 101–111)
GFR calc non Af Amer: 58 mL/min — ABNORMAL LOW (ref >60)
Glucose, Bld: 448 mg/dL — ABNORMAL HIGH (ref 65–99)
Potassium: 4.2 mmol/L (ref 3.5–5.1)
SODIUM: 139 mmol/L (ref 135–145)
Total Bilirubin: 1 mg/dL (ref 0.3–1.2)
Total Protein: 9 g/dL — ABNORMAL HIGH (ref 6.5–8.1)

## 2016-06-26 LAB — RAPID URINE DRUG SCREEN, HOSP PERFORMED
AMPHETAMINES: NOT DETECTED
BARBITURATES: NOT DETECTED
Benzodiazepines: NOT DETECTED
Cocaine: NOT DETECTED
Opiates: NOT DETECTED
Tetrahydrocannabinol: POSITIVE — AB

## 2016-06-26 LAB — I-STAT CG4 LACTIC ACID, ED: Lactic Acid, Venous: 5.18 mmol/L (ref 0.5–1.9)

## 2016-06-26 LAB — BLOOD GAS, VENOUS
ACID-BASE DEFICIT: 3.3 mmol/L — AB (ref 0.0–2.0)
BICARBONATE: 22.7 mmol/L (ref 20.0–28.0)
Drawn by: 11249
FIO2: 0.21
O2 Saturation: 68.2 %
PATIENT TEMPERATURE: 98.6
PH VEN: 7.304 (ref 7.250–7.430)
PO2 VEN: 39.8 mmHg (ref 32.0–45.0)
pCO2, Ven: 47.1 mmHg (ref 44.0–60.0)

## 2016-06-26 LAB — CBG MONITORING, ED
GLUCOSE-CAPILLARY: 449 mg/dL — AB (ref 65–99)
GLUCOSE-CAPILLARY: 397 mg/dL — AB (ref 65–99)

## 2016-06-26 LAB — URINALYSIS, ROUTINE W REFLEX MICROSCOPIC
BILIRUBIN URINE: NEGATIVE
Glucose, UA: >=500 mg/dL — AB
Hgb urine dipstick: NEGATIVE
KETONES UR: 20 mg/dL — AB
LEUKOCYTES UA: NEGATIVE
NITRITE: NEGATIVE
Protein, ur: NEGATIVE mg/dL
Specific Gravity, Urine: 1.011 (ref 1.005–1.030)
pH: 7 (ref 5.0–8.0)

## 2016-06-26 LAB — I-STAT BETA HCG BLOOD, ED (MC, WL, AP ONLY)

## 2016-06-26 LAB — LIPASE, BLOOD: Lipase: 16 U/L (ref 11–51)

## 2016-06-26 MED ORDER — INSULIN REGULAR BOLUS VIA INFUSION
0.0000 [IU] | Freq: Three times a day (TID) | INTRAVENOUS | Status: DC
  Filled 2016-06-26: qty 10

## 2016-06-26 MED ORDER — SODIUM CHLORIDE 0.9 % IV SOLN
INTRAVENOUS | Status: DC
  Administered 2016-06-27: via INTRAVENOUS

## 2016-06-26 MED ORDER — MORPHINE SULFATE (PF) 2 MG/ML IV SOLN
4.0000 mg | Freq: Once | INTRAVENOUS | Status: AC
  Administered 2016-06-26: 4 mg via INTRAVENOUS
  Filled 2016-06-26: qty 2

## 2016-06-26 MED ORDER — HALOPERIDOL LACTATE 5 MG/ML IJ SOLN
5.0000 mg | Freq: Once | INTRAMUSCULAR | Status: AC
  Administered 2016-06-26: 5 mg via INTRAVENOUS
  Filled 2016-06-26: qty 1

## 2016-06-26 MED ORDER — ONDANSETRON HCL 4 MG/2ML IJ SOLN: 4.0000 mg | Freq: Once | INTRAMUSCULAR | Status: DC

## 2016-06-26 MED ORDER — DEXTROSE-NACL 5-0.45 % IV SOLN
INTRAVENOUS | Status: DC
  Administered 2016-06-27: 04:00:00 via INTRAVENOUS

## 2016-06-26 MED ORDER — SODIUM CHLORIDE 0.9 % IV SOLN
INTRAVENOUS | Status: DC
  Administered 2016-06-26: 3.4 [IU]/h via INTRAVENOUS
  Filled 2016-06-26: qty 1

## 2016-06-26 MED ORDER — ONDANSETRON HCL 4 MG/2ML IJ SOLN
4.0000 mg | Freq: Once | INTRAMUSCULAR | Status: AC
  Administered 2016-06-26: 4 mg via INTRAVENOUS
  Filled 2016-06-26: qty 2

## 2016-06-26 MED ORDER — PANTOPRAZOLE SODIUM 40 MG IV SOLR
40.0000 mg | Freq: Once | INTRAVENOUS | Status: AC
  Administered 2016-06-26: 40 mg via INTRAVENOUS
  Filled 2016-06-26: qty 40

## 2016-06-26 MED ORDER — SODIUM CHLORIDE 0.9 % IV BOLUS (SEPSIS)
1000.0000 mL | Freq: Once | INTRAVENOUS | Status: AC
Start: 2016-06-26 — End: 2016-06-26
  Administered 2016-06-26: 1000 mL via INTRAVENOUS

## 2016-06-26 MED ORDER — SODIUM CHLORIDE 0.9 % IV BOLUS (SEPSIS)
1000.0000 mL | Freq: Once | INTRAVENOUS | Status: AC
  Administered 2016-06-26: 1000 mL via INTRAVENOUS

## 2016-06-26 MED ORDER — DEXTROSE 50 % IV SOLN: 25.0000 mL | INTRAVENOUS | Status: DC | PRN

## 2016-06-26 MED ORDER — INSULIN ASPART PROT & ASPART (70-30 MIX) 100 UNIT/ML ~~LOC~~ SUSP
27.0000 [IU] | Freq: Once | SUBCUTANEOUS | Status: DC
  Filled 2016-06-26: qty 10

## 2016-06-26 NOTE — ED Notes (Signed)
Bed: WA10 Expected date:  Expected time:  Means of arrival:  Comments: EMS DKA 

## 2016-06-26 NOTE — ED Notes (Signed)
Venous bld gas previously collected by resp tech results given to EDP

## 2016-06-26 NOTE — ED Triage Notes (Signed)
Per EMS, pt was driving and pulled over to vomit and called EMS complaining of abdominal pain, n/v, and weakness. Pt stated she has been having these symptoms for two hours. Pt reports that when taking her cbg this morning it was 62. Pt received 500 ml bolus and 4 mg of zofran. Hx of diabetes.

## 2016-06-26 NOTE — ED Notes (Signed)
Blood draw x1 in right AC unsuccessful for repeat lactic.

## 2016-06-26 NOTE — ED Provider Notes (Signed)
[facility]-EMERGENCY DEPT Provider Note   CSN: [id number] Arrival date & time: 06/26/16  1655     History   Chief Complaint Chief Complaint  Patient presents with  . Hyperglycemia  . N/V    HPI [name] is a 36 y.o. female.  HPI Patient reports she felt fine this morning. She reports her blood sugar was 60. She did not take her morning insulin because it was low and she needed to go to the drugstore pick up her insulin anyways. She reports quite quickly symptoms of nausea and abdominal pain came on. She reports she started vomiting and then could not stop. She reports this is happened several times in the past. Last time over a year ago. She denies she's had fever chills or general illness leading up to this. She reports alcohol use occasionally perhaps several times per week. Last use greater than 3 days ago. She smokes approximate 5 cigars per day. She reports marijuana use approximately one time per day. Past Medical History:  Diagnosis Date  . Arthritis    "legs and hands" (08/21/2012)  . Chronic bronchitis (HCC)    "get it q yr" (08/21/2012)  . Exertional shortness of breath   . GERD (gastroesophageal reflux disease)   . WUJWJXBJ(478.2)    "weekly" (08/21/2012)  . Hyperlipidemia   . Hypertension   . Incarceration    Was in jail for selling drugs - out Oct 2010.  [name] Renal disorder   . Tobacco abuse   . Type I diabetes mellitus (HCC)    A1c > 11. Patient does not check BG or take insulin regularly.  Dx at age 73.    Patient Active Problem List   Diagnosis Date Noted  . Vaginal discharge 11/16/2015  . Otomycosis of left ear 09/15/2015  . Mild nonproliferative diabetic retinopathy (HCC) 07/25/2013  . Intractable cyclical vomiting with nausea 07/24/2013  . Contraception 04/16/2013  . Unspecified constipation 08/28/2012  . Anemia 04/19/2012  . Essential hypertension, benign 04/19/2012  . Gastroparesis 03/11/2012  . TRIGGER FINGER, RIGHT THUMB 05/24/2009  . TOBACCO  USER 11/07/2008  . Diabetes mellitus type 1, uncontrolled, insulin dependent (HCC) 04/02/2006  . HYPERCHOLESTEROLEMIA 03/29/2006    Past Surgical History:  Procedure Laterality Date  . TUBAL LIGATION  2009    OB History    No data available       Home Medications    Prior to Admission medications   Medication Sig Start Date End Date Taking? Authorizing Provider  Blood Glucose Monitoring Suppl (ACCU-CHEK AVIVA) device 1 each by Other route 4 (four) times daily - after meals and at bedtime. Use as instructed 03/25/15   Abram Sander, MD  cetirizine (ZYRTEC) 10 MG tablet Take 1 tablet (10 mg total) by mouth daily as needed for allergies. 09/14/15   Casey Burkitt, MD  fluticasone [facility]) 50 MCG/ACT nasal spray Place 2 sprays into both nostrils daily. 08/31/15   Danelle Berry, PA-C  glucose blood (ACCU-CHEK AVIVA) test strip 1 each by Other route 4 (four) times daily - after meals and at bedtime. Use as instructed 03/25/15   Abram Sander, MD  HYDROcodone-acetaminophen (NORCO/VICODIN) 5-325 MG per tablet Take 2 tablets by mouth every 4 (four) hours as needed for moderate pain or severe pain. Patient not taking: Reported on 01/25/2015 09/17/13   Melene Plan, DO  insulin aspart (NOVOLOG) 100 UNIT/ML injection Inject 5-6 Units into the skin 3 (three) times daily as needed. For CBG over 180 09/25/12  Losq, Leafy Kindle, MD  insulin aspart protamine- aspart (NOVOLOG MIX 70/30) (70-30) 100 UNIT/ML injection Inject 25-28 Units into the skin daily with breakfast.    [provider]  insulin aspart protamine- aspart (NOVOLOG MIX 70/30) (70-30) 100 UNIT/ML injection Inject 0.25 mLs (25 Units total) into the skin daily with breakfast. 01/26/15   Sam, Serena Y, PA-C  insulin glargine (LANTUS) 100 UNIT/ML injection Inject 0.3 mLs (30 Units total) into the skin at bedtime. Patient not taking: Reported on 01/25/2015 09/25/12   Lonia Skinner, MD  insulin NPH-regular Human (NOVOLIN 70/30)  (70-30) 100 UNIT/ML injection Inject 20-27 Units into the skin 2 (two) times daily. 27 units in the morning before breakfast and 20 units in the evening before supper    [provider]  Lancet Devices [facility]) lancets 1 each by Other route 4 (four) times daily - after meals and at bedtime. Use as instructed 03/25/15   Abram Sander, MD  lisinopril (PRINIVIL,ZESTRIL) 10 MG tablet Take 1 tablet (10 mg total) by mouth daily. Patient not taking: Reported on 01/25/2015 09/25/12   Lonia Skinner, MD  metoCLOPramide (REGLAN) 10 MG tablet Take 1 tablet (10 mg total) by mouth 3 (three) times daily before meals. 12/28/15   McKeag, Janine Ores, MD  metroNIDAZOLE (FLAGYL) 500 MG tablet Take 1 tablet (500 mg total) by mouth 2 (two) times daily. 04/24/16   Araceli Bouche, DO  Multiple Vitamin (MULTIVITAMIN WITH MINERALS) TABS Take 1 tablet by mouth every other day.     [provider]  ondansetron (ZOFRAN ODT) 4 MG disintegrating tablet Take 1 tablet (4 mg total) by mouth every 8 (eight) hours as needed for nausea or vomiting. 07/26/15   Ruch, Gordy Councilman, MD  pantoprazole (PROTONIX) 40 MG tablet Take 40 mg by mouth daily.    [provider]  promethazine (PHENERGAN) 25 MG tablet Take 1 tablet (25 mg total) by mouth every 6 (six) hours as needed for nausea or vomiting. 01/26/15   Sam, Ace Gins, PA-C  valACYclovir (VALTREX) 1000 MG tablet Take 1 tablet (1,000 mg total) by mouth 2 (two) times daily. 09/26/15   McKeag, Janine Ores, MD    Family History No family history on file.  Social History Social History  Substance Use Topics  . Smoking status: Current Every Day Smoker    Packs/day: 0.33    Years: 14.00    Types: Cigarettes  . Smokeless tobacco: Never Used  . Alcohol use 0.0 oz/week     Comment: 08/21/2012 "might drink 1-2 glasses of liquor once/month"     Allergies   Levonorgestrel-ethinyl estrad   Review of Systems Review of Systems 10 Systems reviewed and are  negative for acute change except as noted in the HPI.  Physical Exam Updated Vital Signs BP (!) 111/57 (BP Location: Right Arm)   Pulse 97   Temp 98.1 F (36.7 C) (Oral)   Resp 16   SpO2 94%   Physical Exam  Constitutional: She is oriented to person, place, and time. She appears well-developed and well-nourished. No distress.  Patient appears mildly ill and uncomfortable. She has emesis bag with brownish coffee grounds appearing emesis.  HENT:  Head: Normocephalic and atraumatic.  Mouth/Throat: Oropharynx is clear and moist.  Eyes: Conjunctivae and EOM are normal. Pupils are equal, round, and reactive to light.  Neck: Neck supple.  Cardiovascular: Normal rate and regular rhythm.   No murmur heard. Pulmonary/Chest: Effort normal and breath sounds normal. No respiratory  distress.  Abdominal: Soft. She exhibits no distension. There is tenderness.  Musculoskeletal: Normal range of motion. She exhibits no edema or tenderness.  No peripheral edema. Skin condition is good. No areas of cellulitis.  Neurological: She is alert and oriented to person, place, and time.  Skin: Skin is warm and dry.  Psychiatric: She has a normal mood and affect.  Nursing note and vitals reviewed.    ED Treatments / Results  Labs (all labs ordered are listed, but only abnormal results are displayed) Labs Reviewed  COMPREHENSIVE METABOLIC PANEL - Abnormal; Notable for the following:       Result Value   Chloride 100 (*)    Glucose, Bld 448 (*)    Creatinine, Ser 1.20 (*)    Total Protein 9.0 (*)    GFR calc non Af Amer 58 (*)    Anion gap 16 (*)    All other components within normal limits  CBC WITH DIFFERENTIAL/PLATELET - Abnormal; Notable for the following:    WBC 16.4 (*)    Neutro Abs 14.3 (*)    All other components within normal limits  URINALYSIS, ROUTINE W REFLEX MICROSCOPIC - Abnormal; Notable for the following:    Color, Urine COLORLESS (*)    Glucose, UA >=500 (*)    Ketones, ur 20 (*)     Bacteria, UA RARE (*)    Squamous Epithelial / LPF 0-5 (*)    All other components within normal limits  BLOOD GAS, VENOUS - Abnormal; Notable for the following:    Acid-base deficit 3.3 (*)    All other components within normal limits  RAPID URINE DRUG SCREEN, HOSP PERFORMED - Abnormal; Notable for the following:    Tetrahydrocannabinol POSITIVE (*)    All other components within normal limits  CBG MONITORING, ED - Abnormal; Notable for the following:    Glucose-Capillary 449 (*)    All other components within normal limits  I-STAT CG4 LACTIC ACID, ED - Abnormal; Notable for the following:    Lactic Acid, Venous 5.18 (*)    All other components within normal limits  CBG MONITORING, ED - Abnormal; Notable for the following:    Glucose-Capillary 397 (*)    All other components within normal limits  LIPASE, BLOOD  I-STAT BETA HCG BLOOD, ED ([facility], [facility], [facility] ONLY)  CBG MONITORING, ED  I-STAT CG4 LACTIC ACID, ED    EKG  EKG Interpretation None       Radiology No results found.  Procedures Procedures (including critical care time) CRITICAL CARE Performed by: Arby Barrette   Total critical care time: 45 minutes  Critical care time was exclusive of separately billable procedures and treating other patients.  Critical care was necessary to treat or prevent imminent or life-threatening deterioration.  Critical care was time spent personally by me on the following activities: development of treatment plan with patient and/or surrogate as well as nursing, discussions with consultants, evaluation of patient's response to treatment, examination of patient, obtaining history from patient or surrogate, ordering and performing treatments and interventions, ordering and review of laboratory studies, ordering and review of radiographic studies, pulse oximetry and re-evaluation of patient's condition. Medications Ordered in ED Medications  dextrose 5 %-0.45 % sodium chloride infusion  (not administered)  insulin regular bolus via infusion 0-10 Units (not administered)  insulin regular (NOVOLIN R,HUMULIN R) 100 Units in sodium chloride 0.9 % 100 mL (1 Units/mL) infusion (not administered)  dextrose 50 % solution 25 mL (not administered)  0.9 %  sodium chloride infusion (not administered)  ondansetron (ZOFRAN) injection 4 mg (not administered)  sodium chloride 0.9 % bolus 1,000 mL (1,000 mLs Intravenous New Bag/Given 06/26/16 1859)  ondansetron (ZOFRAN) injection 4 mg (4 mg Intravenous Given 06/26/16 1901)  sodium chloride 0.9 % bolus 1,000 mL (0 mLs Intravenous Stopped 06/26/16 2134)  pantoprazole (PROTONIX) injection 40 mg (40 mg Intravenous Given 06/26/16 1943)  morphine 2 MG/ML injection 4 mg (4 mg Intravenous Given 06/26/16 1943)  haloperidol lactate (HALDOL) injection 5 mg (5 mg Intravenous Given 06/26/16 2133)     Initial Impression / Assessment and Plan / ED Course  I have reviewed the triage vital signs and the nursing notes.  Pertinent labs & imaging results that were available during my care of the patient were reviewed by me and considered in my medical decision making (see chart for details).     22:55 patient reports now nausea is much improved and vomiting has stopped. She feels better. Recheck 2324 reassessed patient had her sit up at bedside. At this time now vomiting has recurred and patient is retching.  00:15 reviewed with FP resident, Jae DireKate. Accepts for transfer to family medicine with Dr. Payton MccallumJeffrey Walden attending. Final Clinical Impressions(s) / ED Diagnoses   Final diagnoses:  Hyperglycemia due to type 1 diabetes mellitus (HCC)  Intractable vomiting with nausea, unspecified vomiting type   Patient is a type I diabetic. She developed recurrent vomiting early today. She reports compliance with her insulin up until this morning. She denies review systems for any recent illness. Patient does smoke marijuana once daily. Consideration for patient's symptoms  is for DKA versus cannabis hyperemesis. Treatment initiated with hydration and symptom control. Symptoms did seem significantly improved after Zofran, protonix,  fluids and Haldol. She did however rebound and again developed vomiting and dry heaves and at that time glucose stabilizer protocol initiated with plan for admission. New Prescriptions New Prescriptions   No medications on file     Arby BarrettePfeiffer, Nneka Blanda, MD 06/27/16 [phone]

## 2016-06-26 NOTE — ED Notes (Signed)
Pt is very aggressive and not well.   Pt is

## 2016-06-27 DIAGNOSIS — E1110 Type 2 diabetes mellitus with ketoacidosis without coma: Secondary | ICD-10-CM | POA: Diagnosis present

## 2016-06-27 LAB — BASIC METABOLIC PANEL
Anion gap: 10 (ref 5–15)
Anion gap: 8 (ref 5–15)
Anion gap: 7 (ref 5–15)
Anion gap: 9 (ref 5–15)
Anion gap: 4 — ABNORMAL LOW (ref 5–15)
BUN: 11 mg/dL (ref 6–20)
BUN: 13 mg/dL (ref 6–20)
BUN: 11 mg/dL (ref 6–20)
BUN: 12 mg/dL (ref 6–20)
BUN: 12 mg/dL (ref 6–20)
CALCIUM: 8.8 mg/dL — AB (ref 8.9–10.3)
CHLORIDE: 110 mmol/L (ref 101–111)
CHLORIDE: 110 mmol/L (ref 101–111)
CHLORIDE: 107 mmol/L (ref 101–111)
CHLORIDE: 103 mmol/L (ref 101–111)
CO2: 23 mmol/L (ref 22–32)
CO2: 23 mmol/L (ref 22–32)
CO2: 25 mmol/L (ref 22–32)
CO2: 23 mmol/L (ref 22–32)
CO2: 27 mmol/L (ref 22–32)
CREATININE: 1.09 mg/dL — AB (ref 0.44–1.00)
CREATININE: 1.14 mg/dL — AB (ref 0.44–1.00)
CREATININE: 1.1 mg/dL — AB (ref 0.44–1.00)
CREATININE: 1.1 mg/dL — AB (ref 0.44–1.00)
CREATININE: 1.21 mg/dL — AB (ref 0.44–1.00)
Calcium: 9 mg/dL (ref 8.9–10.3)
Calcium: 9 mg/dL (ref 8.9–10.3)
Calcium: 8.8 mg/dL — ABNORMAL LOW (ref 8.9–10.3)
Calcium: 8.8 mg/dL — ABNORMAL LOW (ref 8.9–10.3)
Chloride: 103 mmol/L (ref 101–111)
GFR calc Af Amer: >60 mL/min (ref >60)
GFR calc Af Amer: >60 mL/min (ref >60)
GFR calc Af Amer: >60 mL/min (ref >60)
GFR calc Af Amer: >60 mL/min (ref >60)
GFR calc non Af Amer: >60 mL/min (ref >60)
GFR calc non Af Amer: >60 mL/min (ref >60)
GFR calc non Af Amer: >60 mL/min (ref >60)
GFR calc non Af Amer: >60 mL/min (ref >60)
GFR calc non Af Amer: 57 mL/min — ABNORMAL LOW (ref >60)
GLUCOSE: 178 mg/dL — AB (ref 65–99)
Glucose, Bld: 169 mg/dL — ABNORMAL HIGH (ref 65–99)
Glucose, Bld: 155 mg/dL — ABNORMAL HIGH (ref 65–99)
Glucose, Bld: 140 mg/dL — ABNORMAL HIGH (ref 65–99)
Glucose, Bld: 266 mg/dL — ABNORMAL HIGH (ref 65–99)
Potassium: 4 mmol/L (ref 3.5–5.1)
Potassium: 4 mmol/L (ref 3.5–5.1)
Potassium: 4.1 mmol/L (ref 3.5–5.1)
Potassium: 4.3 mmol/L (ref 3.5–5.1)
Potassium: 3.8 mmol/L (ref 3.5–5.1)
SODIUM: 143 mmol/L (ref 135–145)
SODIUM: 141 mmol/L (ref 135–145)
Sodium: 139 mmol/L (ref 135–145)
Sodium: 135 mmol/L (ref 135–145)
Sodium: 134 mmol/L — ABNORMAL LOW (ref 135–145)

## 2016-06-27 LAB — I-STAT CG4 LACTIC ACID, ED: LACTIC ACID, VENOUS: 3.92 mmol/L — AB (ref 0.5–1.9)

## 2016-06-27 LAB — CBG MONITORING, ED
Glucose-Capillary: 357 mg/dL — ABNORMAL HIGH (ref 65–99)
Glucose-Capillary: 239 mg/dL — ABNORMAL HIGH (ref 65–99)

## 2016-06-27 LAB — LACTIC ACID, PLASMA
Lactic Acid, Venous: 1.5 mmol/L (ref 0.5–1.9)
Lactic Acid, Venous: 1.7 mmol/L (ref 0.5–1.9)

## 2016-06-27 LAB — CBC
HCT: 32.6 % — ABNORMAL LOW (ref 36.0–46.0)
HEMATOCRIT: 31.7 % — AB (ref 36.0–46.0)
HEMOGLOBIN: 10.3 g/dL — AB (ref 12.0–15.0)
Hemoglobin: 10.5 g/dL — ABNORMAL LOW (ref 12.0–15.0)
MCH: 29.1 pg (ref 26.0–34.0)
MCH: 29.2 pg (ref 26.0–34.0)
MCHC: 32.5 g/dL (ref 30.0–36.0)
MCHC: 32.2 g/dL (ref 30.0–36.0)
MCV: 89.5 fL (ref 78.0–100.0)
MCV: 90.6 fL (ref 78.0–100.0)
Platelets: 302 10*3/uL (ref 150–400)
Platelets: 296 10*3/uL (ref 150–400)
RBC: 3.54 MIL/uL — ABNORMAL LOW (ref 3.87–5.11)
RBC: 3.6 MIL/uL — ABNORMAL LOW (ref 3.87–5.11)
RDW: 15.5 % (ref 11.5–15.5)
RDW: 15.6 % — AB (ref 11.5–15.5)
WBC: 19.1 10*3/uL — ABNORMAL HIGH (ref 4.0–10.5)
WBC: 22.6 10*3/uL — ABNORMAL HIGH (ref 4.0–10.5)

## 2016-06-27 LAB — GLUCOSE, CAPILLARY
GLUCOSE-CAPILLARY: 180 mg/dL — AB (ref 65–99)
GLUCOSE-CAPILLARY: 218 mg/dL — AB (ref 65–99)
GLUCOSE-CAPILLARY: 182 mg/dL — AB (ref 65–99)
GLUCOSE-CAPILLARY: 140 mg/dL — AB (ref 65–99)
GLUCOSE-CAPILLARY: 118 mg/dL — AB (ref 65–99)
Glucose-Capillary: 145 mg/dL — ABNORMAL HIGH (ref 65–99)
Glucose-Capillary: 191 mg/dL — ABNORMAL HIGH (ref 65–99)
Glucose-Capillary: 153 mg/dL — ABNORMAL HIGH (ref 65–99)
Glucose-Capillary: 161 mg/dL — ABNORMAL HIGH (ref 65–99)
Glucose-Capillary: 267 mg/dL — ABNORMAL HIGH (ref 65–99)
Glucose-Capillary: 153 mg/dL — ABNORMAL HIGH (ref 65–99)

## 2016-06-27 LAB — MRSA PCR SCREENING: MRSA BY PCR: NEGATIVE

## 2016-06-27 MED ORDER — SODIUM CHLORIDE 0.9 % IV SOLN: INTRAVENOUS | Status: DC

## 2016-06-27 MED ORDER — DEXTROSE-NACL 5-0.45 % IV SOLN
INTRAVENOUS | Status: DC
  Administered 2016-06-27: 100 mL/h via INTRAVENOUS

## 2016-06-27 MED ORDER — POTASSIUM CHLORIDE 10 MEQ/100ML IV SOLN
10.0000 meq | INTRAVENOUS | Status: AC
  Administered 2016-06-27 (×2): 10 meq via INTRAVENOUS
  Filled 2016-06-27 (×2): qty 100

## 2016-06-27 MED ORDER — SODIUM CHLORIDE 0.9 % IV SOLN
INTRAVENOUS | Status: DC
  Filled 2016-06-27: qty 1

## 2016-06-27 MED ORDER — INSULIN ASPART 100 UNIT/ML ~~LOC~~ SOLN
0.0000 [IU] | Freq: Three times a day (TID) | SUBCUTANEOUS | Status: DC
  Administered 2016-06-27 – 2016-06-28 (×2): 3 [IU] via SUBCUTANEOUS
  Administered 2016-06-28: 5 [IU] via SUBCUTANEOUS

## 2016-06-27 MED ORDER — ENOXAPARIN SODIUM 40 MG/0.4ML ~~LOC~~ SOLN
40.0000 mg | SUBCUTANEOUS | Status: DC
  Administered 2016-06-27 – 2016-06-28 (×2): 40 mg via SUBCUTANEOUS
  Filled 2016-06-27 (×2): qty 0.4

## 2016-06-27 MED ORDER — INSULIN GLARGINE 100 UNIT/ML ~~LOC~~ SOLN
10.0000 [IU] | Freq: Every day | SUBCUTANEOUS | Status: DC
  Administered 2016-06-27: 10 [IU] via SUBCUTANEOUS
  Filled 2016-06-27 (×2): qty 0.1

## 2016-06-27 NOTE — ED Notes (Signed)
[facility] called for transport 

## 2016-06-27 NOTE — H&P (Signed)
[facility] Admission History and Physical Service Pager: [phone]  Patient name: [name] Medical record number: [id number] Date of birth: 03/11/1980 Age: 36 y.o. Gender: female  Primary Care Provider: Wende MottMcKeag, Janine OresIan D, MD Consultants: none Code Status: FULL  Chief Complaint: nausea and vomiting  Assessment and Plan: [name] is a 36 y.o. female presenting with 2 days of nausea, emesis today. PMH is significant for T1DM, HLD, tobacco use disorder, cyclic vomiting syndrome, gastroparesis,   Nausea and vomiting, most likely from DKA : patient reports last episode of DKA was several years ago, but nausea and vomiting was her presentation at that time. Unfortunately there was not a complete work up in the ED for this: AG was slightly elevated at 16, bicarb was 23, glucose was 448, UA with >500 glucose and 20 ketones, no abg or beta hydroxybutyric acid level. Additionally on the DDx is gastroparesis, cyclic vomiting (possivbly worse due to marijuana use).  She does not see any MD recently for her T1DM and buys over the counter 70/30. She did not take insulin today because she was out. Her CBG yesterday was 67. No sick contacts, no diarrhea, one episode of vomiting making GI illness as a trigger less likely. Denies URI symptoms.  -admit to stepdown, Dr. Gwendolyn GrantWalden attending -insulin drip  -BMP q4 -2 runs IV K  -DKA protocol: as CBG already less than 250, will start with D5-1/2NS with insulin drip until anion gap is closed.  -zofran PRN   Leukocytosis: patient gives absolutely no infectious symptoms apart from vomiting, which she attributes to her DKA. Some mild leukocytosis can be present with DKA due to stress response. If not improving, would consider asking further about vaginal discharge (frequent for the patient per chart review) and sexual history. She denies vaginal symptoms tonight. Recent negative HIV and RPR.  -recheck CBC  Elevated Cr: baseline  appears to be 0.99. Cr elevated to 1.2 today in the setting of poor PO intake x 2 days and DKA. Likely prerenal. Given 2L NS bolus in ED + DKA fluids, expect improvement with hydration.  -consider FeNa if not improving with hydration  Tobacco use: patient reports smoking 4-5 cigarettes per day and some marijuana.  -cessation counseling  Healthcare coordination: patient only has family planning medicaid. She needs endocrinology for her T1DM. She also likely needs LARC if she is willing for birth control as to avoid regular costs of OCPs.  [facility] consult -defer LARC to PCP  FEN/GI: carb mod diet, zofran PRN Prophylaxis: lovenox  Disposition: admit to stepdown  History of Present Illness:  [name] is a 36 y.o. female presenting with nausea x 2 days, poor PO intake, and vomiting today.  She has a 2 day history of nausea and vomiting. Yesterday she took her CBG and it was 2667. She has not wanted to check her blood sugars today due to feeling so ill. She had abdominal pain earlier but this resolved. She notes pain was diffuse and felt like an ache. This has since resolved. She notes she had to have a big bowel movement today; soft. She states when her blood sugars are high she "has big poops or throws it up" to "get rid of the acid." She denies fevers, chills, headache, chest pain. No cough, rhinorrhea, denies URI symptoms. Developing a pruritic rash on her back that comes and goes without cause. This has been off and on for several weeks.  She becomes frustrated with further questioning  and states she has not had any preceding illness and does not wish to answer additional questions.   She has a history of T1DM. 25 units of 72/30 in AM and 20 units in the PM. She does not have a sliding scale. She no longer goes to the Diabetes center as she cannot afford it; just buys insulin over the counter. Since being in the ED, her nausea resolved. She now just feels "real dry."   In ED, 2L NS bolus given,  as well as Zofran, protonix,  fluids and Haldol. Initial differential was DKA vs cannabis hyperemesis. Vomiting returned and glucose stabilizer was started.   Review Of Systems: Per HPI with the following additions:   Review of Systems  Constitutional: Negative for chills, diaphoresis and fever.  Respiratory: Negative for cough and sputum production.   Cardiovascular: Negative for chest pain and palpitations.  Gastrointestinal: Positive for nausea and vomiting. Negative for abdominal pain, constipation and diarrhea.  Skin: Positive for itching. Negative for rash.    Patient Active Problem List   Diagnosis Date Noted  . DKA (diabetic ketoacidoses) (HCC) 06/27/2016  . DKA, type 1 (HCC) 06/26/2016  . Vaginal discharge 11/16/2015  . Otomycosis of left ear 09/15/2015  . Mild nonproliferative diabetic retinopathy (HCC) 07/25/2013  . Intractable cyclical vomiting with nausea 07/24/2013  . Contraception 04/16/2013  . Unspecified constipation 08/28/2012  . Anemia 04/19/2012  . Essential hypertension, benign 04/19/2012  . Gastroparesis 03/11/2012  . TRIGGER FINGER, RIGHT THUMB 05/24/2009  . TOBACCO USER 11/07/2008  . Diabetes mellitus type 1, uncontrolled, insulin dependent (HCC) 04/02/2006  . HYPERCHOLESTEROLEMIA 03/29/2006    Past Medical History: Past Medical History:  Diagnosis Date  . Arthritis    "legs and hands" (08/21/2012)  . Chronic bronchitis (HCC)    "get it q yr" (08/21/2012)  . Exertional shortness of breath   . GERD (gastroesophageal reflux disease)   . WUJWJXBJ(478.2)    "weekly" (08/21/2012)  . Hyperlipidemia   . Hypertension   . Incarceration    Was in jail for selling drugs - out Oct 2010.  [name] Renal disorder   . Tobacco abuse   . Type I diabetes mellitus (HCC)    A1c > 11. Patient does not check BG or take insulin regularly.  Dx at age 21.    Past Surgical History: Past Surgical History:  Procedure Laterality Date  . TUBAL LIGATION  2009    Social  History: Social History  Substance Use Topics  . Smoking status: Current Every Day Smoker    Packs/day: 0.33    Years: 14.00    Types: Cigarettes  . Smokeless tobacco: Never Used  . Alcohol use 0.0 oz/week     Comment: 08/21/2012 "might drink 1-2 glasses of liquor once/month"   Additional social history: 4-5 cigarettes per day, occasional alcohol last drink last week, marijuana, no IV drugs  Please also refer to relevant sections of EMR.  Family History: History reviewed. No pertinent family history. Patient frustrated and sleepy, declines further questions.   Allergies and Medications: Allergies  Allergen Reactions  . Levonorgestrel-Ethinyl Estrad Cough    Sneezing, headaches.   No current facility-administered medications on file prior to encounter.    Current Outpatient Prescriptions on File Prior to Encounter  Medication Sig Dispense Refill  . insulin aspart protamine- aspart (NOVOLOG MIX 70/30) (70-30) 100 UNIT/ML injection Inject 0.25 mLs (25 Units total) into the skin daily with breakfast. (Patient taking differently: Inject 20-25 Units into the skin 2 (two) times  daily with a meal. 25 units in the AM and 20 in the evening) 10 mL 0  . Blood Glucose Monitoring Suppl (ACCU-CHEK AVIVA) device 1 each by Other route 4 (four) times daily - after meals and at bedtime. Use as instructed 1 each 0  . cetirizine (ZYRTEC) 10 MG tablet Take 1 tablet (10 mg total) by mouth daily as needed for allergies. (Patient not taking: Reported on 06/26/2016) 30 tablet 1  . fluticasone (FLONASE) 50 MCG/ACT nasal spray Place 2 sprays into both nostrils daily. (Patient not taking: Reported on 06/26/2016) 16 g 0  . glucose blood (ACCU-CHEK AVIVA) test strip 1 each by Other route 4 (four) times daily - after meals and at bedtime. Use as instructed 100 each 12  . HYDROcodone-acetaminophen (NORCO/VICODIN) 5-325 MG per tablet Take 2 tablets by mouth every 4 (four) hours as needed for moderate pain or severe  pain. (Patient not taking: Reported on 01/25/2015) 6 tablet 0  . insulin aspart (NOVOLOG) 100 UNIT/ML injection Inject 5-6 Units into the skin 3 (three) times daily as needed. For CBG over 180 (Patient not taking: Reported on 06/26/2016) 1 vial 10  . insulin glargine (LANTUS) 100 UNIT/ML injection Inject 0.3 mLs (30 Units total) into the skin at bedtime. (Patient not taking: Reported on 01/25/2015) 10 mL 6  . Lancet Devices (ACCU-CHEK SOFTCLIX) lancets 1 each by Other route 4 (four) times daily - after meals and at bedtime. Use as instructed 1 each 11  . lisinopril (PRINIVIL,ZESTRIL) 10 MG tablet Take 1 tablet (10 mg total) by mouth daily. (Patient not taking: Reported on 01/25/2015) 90 tablet 3  . metoCLOPramide (REGLAN) 10 MG tablet Take 1 tablet (10 mg total) by mouth 3 (three) times daily before meals. (Patient not taking: Reported on 06/26/2016) 90 tablet 5  . metroNIDAZOLE (FLAGYL) 500 MG tablet Take 1 tablet (500 mg total) by mouth 2 (two) times daily. (Patient not taking: Reported on 06/26/2016) 14 tablet 0  . ondansetron (ZOFRAN ODT) 4 MG disintegrating tablet Take 1 tablet (4 mg total) by mouth every 8 (eight) hours as needed for nausea or vomiting. (Patient not taking: Reported on 06/26/2016) 20 tablet 0  . promethazine (PHENERGAN) 25 MG tablet Take 1 tablet (25 mg total) by mouth every 6 (six) hours as needed for nausea or vomiting. (Patient not taking: Reported on 06/26/2016) 30 tablet 0  . valACYclovir (VALTREX) 1000 MG tablet Take 1 tablet (1,000 mg total) by mouth 2 (two) times daily. (Patient not taking: Reported on 06/26/2016) 20 tablet 0    Objective: BP 134/73 (BP Location: Right Arm)   Pulse 97   Temp 98.7 F (37.1 C) (Oral)   Resp 15   Ht 5\' 8"  (1.727 m)   Wt 162 lb 14.7 oz (73.9 kg)   SpO2 98%   BMI 24.77 kg/m  Exam: General: well appearing tired female lying in bed in NAD. Eyes: EOMI, PEERLA.  ENTM: MMM, no thrush  Neck: supple, no JVD Cardiovascular: RRR, no  murmur Respiratory: CTAB, easy WOB Gastrointestinal: SNTND, +BS MSK: appropriate muscle tone and bulk. No pitting edema Derm: no rashes or wounds on visualized skin specifically there are a few areas of hyperpigmentation on the back, but no other lesions   Neuro: CN II-XII grossly intact Psych: flat affect, mood appropriate  Labs and Imaging: CBC BMET   Recent Labs Lab 06/26/16 1852  WBC 16.4*  HGB 12.3  HCT 37.6  PLT 347    Recent Labs Lab 06/26/16 1852  NA 139  K 4.2  CL 100*  CO2 23  BUN 13  CREATININE 1.20*  GLUCOSE 448*  CALCIUM 9.9     Lipase WNL Lactic acid 5.18 > 3.92 I stat beta Hcg negative  EKG: NSR, HR 95, no significant ST depression or elevation, no significant change from previous. QTc 434  Garth Bigness, MD 06/27/2016, 3:40 AM PGY-1, [facility] Family Medicine FPTS Intern pager: [phone], text pages welcome  Upper Level Addendum:  I have seen and evaluated this patient along with Dr. Chanetta Marshall and reviewed the above note, making necessary revisions in purple.   Joanna Puff, MD [facility] Family Medicine Resident, PGY-3

## 2016-06-27 NOTE — Progress Notes (Signed)
FPTS Interim Progress Note  S: Patient sleeping, easily arousable. States she thinks she could be getting a yeast infection, but otherwise has no idea why her WBCs could be elevated.   O: BP 134/73 (BP Location: Right Arm)   Pulse 97   Temp 98.7 F (37.1 C) (Oral)   Resp 15   Ht 5\' 8"  (1.727 m)   Wt 162 lb 14.7 oz (73.9 kg)   SpO2 98%   BMI 24.77 kg/m   General: well appearing tired female lying in bed in NAD. Cardiovascular: RRR, no murmur Respiratory: CTAB, easy WOB Gastrointestinal: SNTND, +BS  A/P: DKA: resolved. Plan to transition to subq insulin today after patient's discussion with Dr. Raymondo BandKoval about dispo planning and type of insulin we can help her get according to costs (70/30 vs Lantus). AG closed x 2.  -transition to subq insulin  -SSI after transition  N/V: resolved. Unclear if etiology is 2/2 DKA vs cannabinoid hyperemesis.  -zofran PRN  Leukocytosis: unclear etiology. Patient again this morning denies any infectious symptoms. States she might be getting a yeast infection. Of note, she had a very similar presentation of vomiting and leukocytosis in 12/2014 thought to be possibly viral vs cannabinoid hyperemesis. On my research, there are case reports of similar elevated WBCs to 19 in the setting of cannabinoid hyperemesis.  -recommend marijuana cessation  Garth Bignessimberlake, Kathryn, MD 06/27/2016, 6:42 AM PGY-1, [facility] pager [phone]

## 2016-06-27 NOTE — Progress Notes (Signed)
Inpatient Diabetes Program Recommendations  AACE/ADA: New Consensus Statement on Inpatient Glycemic Control (2015)  Target Ranges:  Prepandial:   less than 140 mg/dL      Peak postprandial:   less than 180 mg/dL (1-2 hours)      Critically ill patients:  140 - 180 mg/dL   Lab Results  Component Value Date   GLUCAP 182 (H) 06/27/2016   HGBA1C 9.0 04/24/2016    Review of Glycemic Control:  Results for [name], Rachel R (MRN [id number]) as of 06/27/2016 12:46  Ref. Range 06/27/2016 03:47 06/27/2016 04:46 06/27/2016 05:58 06/27/2016 06:37 06/27/2016 07:56  Glucose-Capillary Latest Ref Range: 65 - 99 mg/dL 409180 (H) 811145 (H) 914191 (H) 218 (H) 182 (H)    Diabetes history: Type 1 diabetes Outpatient Diabetes medications: 70/30 25 units in the AM and 20 units in the PM-patient states she buys insulin over the counter Current orders for Inpatient glycemic control:  Novolog sensitive tid with meals, Lantus 10 units daily  Inpatient Diabetes Program Recommendations:    Spoke with patient regarding DKA episode.  She states that she did not get her insulin in time and therefore went in DKA.  She planned to pick up insulin after work, but got sick prior to going [facility] [employer].  We discussed that she has to have insulin and cannot skip doses.  Patient verbalized understanding.  Daughter at bedside.  She has been buying insulin over the counter and not seeing MD. A1C in March was 9%, which she states is an improvement.    MD please consider d/c of Lantus and restart 70/30 18 units bid.  Thanks,  Rachel MeagerJenny Nieves Barberi, RN, BC-ADM Inpatient Diabetes Coordinator Pager [phone] (8a-5p)

## 2016-06-28 LAB — CBC
HEMATOCRIT: 34.2 % — AB (ref 36.0–46.0)
HEMOGLOBIN: 11.2 g/dL — AB (ref 12.0–15.0)
MCH: 29.4 pg (ref 26.0–34.0)
MCHC: 32.7 g/dL (ref 30.0–36.0)
MCV: 89.8 fL (ref 78.0–100.0)
Platelets: 302 10*3/uL (ref 150–400)
RBC: 3.81 MIL/uL — ABNORMAL LOW (ref 3.87–5.11)
RDW: 15.4 % (ref 11.5–15.5)
WBC: 13.4 10*3/uL — ABNORMAL HIGH (ref 4.0–10.5)

## 2016-06-28 LAB — GLUCOSE, CAPILLARY
Glucose-Capillary: 332 mg/dL — ABNORMAL HIGH (ref 65–99)
Glucose-Capillary: 228 mg/dL — ABNORMAL HIGH (ref 65–99)
Glucose-Capillary: 126 mg/dL — ABNORMAL HIGH (ref 65–99)

## 2016-06-28 LAB — URINALYSIS, ROUTINE W REFLEX MICROSCOPIC
Bilirubin Urine: NEGATIVE
HGB URINE DIPSTICK: NEGATIVE
Ketones, ur: 20 mg/dL — AB
Leukocytes, UA: NEGATIVE
Nitrite: NEGATIVE
PH: 6 (ref 5.0–8.0)
Protein, ur: NEGATIVE mg/dL
SPECIFIC GRAVITY, URINE: 1.017 (ref 1.005–1.030)
WBC, UA: NONE SEEN WBC/hpf (ref 0–5)

## 2016-06-28 MED ORDER — SODIUM CHLORIDE 0.9 % IV BOLUS (SEPSIS)
1000.0000 mL | Freq: Once | INTRAVENOUS | Status: AC
  Administered 2016-06-28: 1000 mL via INTRAVENOUS

## 2016-06-28 MED ORDER — INSULIN GLARGINE 100 UNIT/ML ~~LOC~~ SOLN
18.0000 [IU] | Freq: Every day | SUBCUTANEOUS | Status: DC
  Administered 2016-06-28: 18 [IU] via SUBCUTANEOUS
  Filled 2016-06-28: qty 0.18

## 2016-06-28 MED ORDER — SODIUM CHLORIDE 0.9 % IV SOLN: INTRAVENOUS | Status: DC

## 2016-06-28 MED ORDER — INSULIN GLARGINE 100 UNIT/ML ~~LOC~~ SOLN: 18.0000 [IU] | Freq: Every day | SUBCUTANEOUS | 11 refills | Status: DC

## 2016-06-28 MED ORDER — INSULIN GLARGINE 100 UNIT/ML ~~LOC~~ SOLN
11.0000 [IU] | Freq: Every day | SUBCUTANEOUS | Status: DC
Start: 2016-06-28 — End: 2016-06-28
  Filled 2016-06-28: qty 0.11

## 2016-06-28 MED ORDER — INSULIN ASPART 100 UNIT/ML ~~LOC~~ SOLN: 0.0000 [IU] | Freq: Three times a day (TID) | SUBCUTANEOUS | 11 refills | Status: DC

## 2016-06-28 MED ORDER — INSULIN ASPART 100 UNIT/ML ~~LOC~~ SOLN
0.0000 [IU] | Freq: Three times a day (TID) | SUBCUTANEOUS | Status: DC
  Administered 2016-06-28: 2 [IU] via SUBCUTANEOUS

## 2016-06-28 NOTE — Progress Notes (Signed)
Transitions of Care Pharmacy Note  Plan:  Educated on use of sliding scale insulin dosing for rapid-acting Counseled on use of Lantus Solostar pen Discussed S/Sx hypoglycemia --------------------------------------------- [name] is an 36 y.o. female who presents with a chief complaint of hyperglycemia. In anticipation of discharge, pharmacy has reviewed this patient's prior to admission medication history, as well as [facility] inpatient [facility].  Current medication indications, dosing, frequency, and notable side effects reviewed with patient. patient verbalized understanding of current inpatient medication regimen and is aware that the After Visit Summary when presented, will represent the most accurate medication list at discharge.   Assessment: Understanding of regimen: good Understanding of indications: good Potential of compliance: good Barriers to Obtaining Medications: Yes - pt given samples as noted below with plans to F/U with pharmacist in clinic within the next month for help with patient assistance.  Patient instructed to contact inpatient pharmacy team with further questions or concerns if needed.    Time spent preparing for discharge counseling: 25 Time spent counseling patient: 7625   Thank you for allowing pharmacy to be a part of this patient's care.  Mosetta AnisMichael T Bitonti  Medication Samples have been provided to the patient.  Drug name: Lantus Solostar      Strength: 100 units/ml        Qty: #2 pens   LOT: [id number]  Exp.Date: 03/30/2018  Dosing instructions: Inject 18 units SQ once daily at bedtime  Drug name: Novolog vial       Strength: 100 units/ml             Qty: #1 vial (10 ml)  LOT: [id number]       Exp.Date: 09/2017  Dosing instructions: SSI as noted below CBG < 70: implement hypoglycemia protocol  CBG 70 - 120: 0 units CBG 121 - 150: 2 units CBG 151 - 200: 3 units CBG 201 - 250: 5 units CBG 251 - 300: 8 units CBG 301 - 350: 11  units CBG 351 - 400: 15 units  The patient has been instructed regarding the correct time, dose, and frequency of taking this medication, including desired effects and most common side effects.   Mosetta AnisMichael T Bitonti 4:40 PM 06/28/2016

## 2016-06-28 NOTE — Progress Notes (Signed)
PCP Social Visit Note [facility] Kathee DeltonIan D Jenny Lai, MD,MS, PGY-3  I have seen patient and discussed current hospitalization. I agree with the excellent care provided by the primary [facility] and want to thank them for their continued efforts in caring for my patient.   Mickie HillierIan Jermar Colter, MD,MS, PGY-3 06/28/2016 3:58 PM

## 2016-06-28 NOTE — Discharge Instructions (Addendum)
It has been a pleasure taking care of you! You were admitted due to diabetic ketoacidosis. We have treated you with insulin drips and intravenous fluids. With that your symptoms improved to the point we think it is safe to let you go home and follow up with your primary care doctor. We are discharging you on Lantus (long-acting insulin) and NovoLog (short-acting insulin) and your other home medications that you need to continue taking after you leave the hospital. There could be some changes made to your home medications during this hospitalization. Please, make sure to read the directions before you take them. The names and directions on how to take these medications are found on this discharge paper under medication section.  Insulin instructions:  Inject 18 units of Lantus in the morning:  Check your blood glucose about 15 minutes before meals and inject sliding scale Novolog as follows:  0-15 Units, Subcutaneous, 3 times daily with meals CBG < 70: implement hypoglycemia protocol (see below).  CBG 70 - 120: 0 units CBG 121 - 150: 2 units CBG 151 - 200: 3 units CBG 201 - 250: 5 units CBG 251 - 300: 8 units CBG 301 - 350: 11 units CBG 351 - 400: 15 units Call your doctor if your blood glucose is greater than 400.   Please follow up at your primary care doctor's office. The address, date and time are found on the discharge paper under follow up section.  Once you are discharged, your primary care physician will handle any further medical issues.  Please note that NO REFILLS for any discharge medications will be authorized once you are discharged, as it is imperative that you return to your primary care physician (or establish a relationship with a primary care physician if you do not have one) for your aftercare needs so that they can reassess your need for medications and monitor your lab values.   Hypoglycemia  Hypoglycemia is when the sugar (glucose) level in the blood is too low.  Symptoms of low blood sugar may include:  Feeling:  Hungry.  Worried or nervous (anxious).  Sweaty and clammy.  Confused.  Dizzy.  Sleepy.  Sick to your stomach (nauseous).  Having:  A fast heartbeat.  A headache.  A change in your vision.  Jerky movements that you cannot control (seizure).  Nightmares.  Tingling or no feeling (numbness) around the mouth, lips, or tongue.  Having trouble with:  Talking.  Paying attention (concentrating).  Moving (coordination).  Sleeping.  Shaking.  Passing out (fainting).  Getting upset easily (irritability). Low blood sugar can happen to people who have diabetes and people who do not have diabetes. Low blood sugar can happen quickly, and it can be an emergency. Treating Low Blood Sugar  Low blood sugar is often treated by eating or drinking something sugary right away. If you can think clearly and swallow safely, follow the 15:15 rule:  Take 15 grams of a fast-acting carb (carbohydrate). Some fast-acting carbs are:  1 tube of glucose gel.  3 sugar tablets (glucose pills).  6-8 pieces of hard candy.  4 oz (120 mL) of fruit juice.  4 oz (120 mL) of regular (not diet) soda.  Check your blood sugar 15 minutes after you take the carb.  If your blood sugar is still at or below 70 mg/dL (3.9 mmol/L), take 15 grams of a carb again.  If your blood sugar does not go above 70 mg/dL (3.9 mmol/L) after 3 tries, get help right  away.  After your blood sugar goes back to normal, eat a meal or a snack within 1 hour. Treating Very Low Blood Sugar  If your blood sugar is at or below 54 mg/dL (3 mmol/L), you have very low blood sugar (severe hypoglycemia). This is an emergency. Do not wait to see if the symptoms will go away. Get medical help right away. Call your local emergency services (911 in the [place] Do not drive yourself to the hospital. If you have very low blood sugar and you cannot eat or drink, you may need a glucagon  shot (injection). A family member or friend should learn how to check your blood sugar and how to give you a glucagon shot. Ask your doctor if you need to have a glucagon shot kit at home. Follow these instructions at home: General instructions   Avoid any diets that cause you to not eat enough food. Talk with your doctor before you start any new diet.  Take over-the-counter and prescription medicines only as told by your doctor.  Limit alcohol to no more than 1 drink per day for nonpregnant women and 2 drinks per day for men. One drink equals 12 oz of beer, 5 oz of wine, or 1 oz of hard liquor.  Keep all follow-up visits as told by your doctor. This is important. If You Have Diabetes:    Make sure you know the symptoms of low blood sugar.  Always keep a source of sugar with you, such as:  Sugar.  Sugar tablets.  Glucose gel.  Fruit juice.  Regular soda (not diet soda).  Milk.  Hard candy.  Honey.  Take your medicines as told.  Follow your exercise and meal plan.  Eat on time. Do not skip meals.  Follow your sick day plan when you cannot eat or drink normally. Make this plan ahead of time with your doctor.  Check your blood sugar as often as told by your doctor. Always check before and after exercise.  Share your diabetes care plan with:  Your work or school.  People you live with.  Check your pee (urine) for ketones:  When you are sick.  As told by your doctor.  Carry a card or wear jewelry that says you have diabetes. If You Have Low Blood Sugar From Other Causes:    Check your blood sugar as often as told by your doctor.  Follow instructions from your doctor about what you cannot eat or drink. Contact a doctor if:  You have trouble keeping your blood sugar in your target range.  You have low blood sugar often. Get help right away if:  You still have symptoms after you eat or drink something sugary.  Your blood sugar is at or below 54 mg/dL  (3 mmol/L).  You have jerky movements that you cannot control.  You pass out. These symptoms may be an emergency. Do not wait to see if the symptoms will go away. Get medical help right away. Call your local emergency services (911 in the [place] Do not drive yourself to the hospital. This information is not intended to replace advice given to you by your health care provider. Make sure you discuss any questions you have with your health care provider. Document Released: 04/12/2009 Document Revised: 06/24/2015 Document Reviewed: 02/19/2015 Elsevier Interactive Patient Education  2017 [employer].

## 2016-06-28 NOTE — Progress Notes (Signed)
Family Medicine Teaching Service Daily Progress Note Intern Pager: [phone]  Patient name: [name] Medical record number: [id number] Date of birth: 05/01/1980 Age: 36 y.o. Gender: female  Primary Care Provider: Kathee DeltonMcKeag, Ian D, MD Consultants: none Code Status: FULL  Pt Overview and Major Events to Date:  5/28 admitted with DKA, resolved 5/29 persistent leukocytosis  Assessment and Plan: [name] is a 36 y.o. female presenting with 2 days of nausea, emesis today. PMH is significant for T1DM, HLD, tobacco use disorder, cyclic vomiting syndrome, gastroparesis,   DKA, resolved  T1DM: presented with N/V and AG 16. Resolved after several hours on insulin drip. T1DM - had been taking 70/30 insulin at home via OTC ~25 U am and 20 U pm.  -Lantus 11U daily (not using Levemir as patient will work on getting orange card and we will help with samples)  Leukocytosis: unclear etiology, patient continues to deny any infectious symptoms.  -recheck UA  -recheck CBC this am  Elevated Cr: baseline appears to be 0.99. Cr elevated to 1.2 today in the setting of poor PO intake x 2 days and DKA. Likely prerenal. Given 2L NS bolus in ED + DKA fluids, expect improvement with hydration.  -1L NS bolus -recheck BMP this am  Tobacco use: patient reports smoking 4-5 cigarettes per day and some marijuana.  -cessation counseling  Healthcare coordination: patient only has family planning medicaid. She needs endocrinology for her T1DM. She also likely needs LARC if she is willing for birth control as to avoid regular costs of OCPs.  [facility] consult -defer LARC to PCP  FEN/GI: carb mod diet, zofran PRN Prophylaxis: lovenox  Disposition: possible discharge pending repeat BMP  Subjective:  Patient feels well this am, continues to deny infectious symptoms.   Objective: Temp:  [98.2 F (36.8 C)-99 F (37.2 C)] 99 F (37.2 C) (05/30 0553) Pulse Rate:  [79-95] 79 (05/30 0553) Resp:  [12-20]  18 (05/30 0553) BP: (95-127)/(57-78) 116/70 (05/30 0553) SpO2:  [96 %-100 %] 96 % (05/30 0553) Weight:  [164 lb 14.4 oz (74.8 kg)] 164 lb 14.4 oz (74.8 kg) (05/29 2140) Physical Exam: General: well appearing female resting in bed in NAD. Cardiovascular: RRR, no murmur  Respiratory: CTAB, easy WOB  Abdomen: SNTND, +BS Extremities: no edema, warm  Laboratory:  Recent Labs Lab 06/26/16 1852 06/27/16 0507 06/27/16 1918  WBC 16.4* 19.1* 22.6*  HGB 12.3 10.3* 10.5*  HCT 37.6 31.7* 32.6*  PLT 347 302 296    Recent Labs Lab 06/26/16 1852  06/27/16 1204 06/27/16 1558 06/27/16 1918  NA 139  < > 139 135 134*  K 4.2  < > 4.1 4.3 3.8  CL 100*  < > 107 103 103  CO2 23  < > 25 23 27   BUN 13  < > 11 12 12   CREATININE 1.20*  < > 1.10* 1.10* 1.21*  CALCIUM 9.9  < > 8.8* 8.8* 8.8*  PROT 9.0*  --   --   --   --   BILITOT 1.0  --   --   --   --   ALKPHOS 60  --   --   --   --   ALT 18  --   --   --   --   AST 34  --   --   --   --   GLUCOSE 448*  < > 140* 266* 178*  < > = values in this interval not displayed.  Imaging/Diagnostic Tests:  No results found.  Garth Bigness, MD 06/28/2016, 6:31 AM PGY-1, [facility] Family Medicine FPTS Intern pager: [phone], text pages welcome

## 2016-06-28 NOTE — Discharge Summary (Signed)
Family Medicine Teaching [facility] Discharge Summary  Patient name: [name] Medical record number: [id number] Date of birth: [birth year] Age: 36 y.o. Gender: female Date of Admission: 06/26/2016  Date of Discharge: 06/28/16 Admitting Physician: Tobey Grim, MD  Primary Care Provider: Wende Mott Janine Ores, MD Consultants: none  Indication for Hospitalization: DKA  Discharge Diagnoses/Problem List:  T1DM Leukocytosis Tobacco abuse  Disposition: home  Discharge Condition: improved  Discharge Exam: see progress note from day of discharge  Brief Hospital Course:  Patient transferred from [facility] after nausea and vomiting x 1 day and elevated CBGs. In ED, 2L NS bolus given, as well as Zofran, protonix, fluids and Haldol. Initial differential was DKA vs cannabis hyperemesis. Vomiting returned and glucose stabilizer was started. History of T1DM and had not taken insulin x 2 days. She had been buying this from [employer] over the counter as she was unable to afford MD visits due to only have family planning medicaid. Vomiting resolved on our admission exam, and patient was continued on insulin drip until am of HD#2. Lantus was started as we hope patient will be able to obtain some insurance coverage and then use Lantus/novolog sliding scale. Lantus was increased to 18U. Leukocytosis was present, despite patient denying any infectious etiology. Question whether this could be related to cannabinoid hyperemesis syndrome (case reports of this). UA repeated without signs of infection.   Issues for Follow Up:  1. Patient may need [facility] to manage her insulin while she awaits additional insurance coverage to get back in with endocrinology. She was planning on orange card as below. She was given Lantus and novolog samples to take home.  2. Patient needs birth control - not able to afford OCPs, would likely benefit from LARC.  3. Patient is going to work on getting the orange card - this could help her  afford insulins and endocrinology visits.  4. Please recheck BMP for mildly elevated Cr.  5. Tobacco abuse - please continue cessation counseling, particularly marijuana, as this can be contributing to possible cannabinoid hyperemesis.   Significant Procedures: none  Significant Labs and Imaging:   Recent Labs Lab 06/27/16 0507 06/27/16 1918 06/28/16 1106  WBC 19.1* 22.6* 13.4*  HGB 10.3* 10.5* 11.2*  HCT 31.7* 32.6* 34.2*  PLT 302 296 302    Recent Labs Lab 06/26/16 1852 06/27/16 0507 06/27/16 0851 06/27/16 1204 06/27/16 1558 06/27/16 1918  NA 139 143 141 139 135 134*  K 4.2 4.0 4.0 4.1 4.3 3.8  CL 100* 110 110 107 103 103  CO2 23 23 23 25 23 27   GLUCOSE 448* 169* 155* 140* 266* 178*  BUN 13 11 13 11 12 12   CREATININE 1.20* 1.09* 1.14* 1.10* 1.10* 1.21*  CALCIUM 9.9 9.0 9.0 8.8* 8.8* 8.8*  ALKPHOS 60  --   --   --   --   --   AST 34  --   --   --   --   --   ALT 18  --   --   --   --   --   ALBUMIN 5.0  --   --   --   --   --     Results/Tests Pending at Time of Discharge: none  Discharge Medications:  Allergies as of 06/28/2016      Reactions   Levonorgestrel-ethinyl Estrad Cough   Sneezing, headaches.      Medication List    STOP taking these medications   HYDROcodone-acetaminophen 5-325 MG tablet  Commonly known as:  NORCO/VICODIN   insulin aspart protamine- aspart (70-30) 100 UNIT/ML injection Commonly known as:  NOVOLOG MIX 70/30   metroNIDAZOLE 500 MG tablet Commonly known as:  FLAGYL   ondansetron 4 MG disintegrating tablet Commonly known as:  ZOFRAN ODT   promethazine 25 MG tablet Commonly known as:  PHENERGAN     TAKE these medications   ACCU-CHEK AVIVA device 1 each by Other route 4 (four) times daily - after meals and at bedtime. Use as instructed   accu-chek softclix lancets 1 each by Other route 4 (four) times daily - after meals and at bedtime. Use as instructed   cetirizine 10 MG tablet Commonly known as:  ZYRTEC Take 1  tablet (10 mg total) by mouth daily as needed for allergies.   fluticasone 50 MCG/ACT nasal spray Commonly known as:  FLONASE Place 2 sprays into both nostrils daily.   glucose blood test strip Commonly known as:  ACCU-CHEK AVIVA 1 each by Other route 4 (four) times daily - after meals and at bedtime. Use as instructed   insulin aspart 100 UNIT/ML injection Commonly known as:  novoLOG Inject 0-15 Units into the skin 3 (three) times daily with meals. What changed:  how much to take  when to take this  reasons to take this  additional instructions   insulin glargine 100 UNIT/ML injection Commonly known as:  LANTUS Inject 0.18 mLs (18 Units total) into the skin daily. What changed:  how much to take  when to take this   lisinopril 10 MG tablet Commonly known as:  PRINIVIL,ZESTRIL Take 1 tablet (10 mg total) by mouth daily.   metoCLOPramide 10 MG tablet Commonly known as:  REGLAN Take 1 tablet (10 mg total) by mouth 3 (three) times daily before meals.   valACYclovir 1000 MG tablet Commonly known as:  VALTREX Take 1 tablet (1,000 mg total) by mouth 2 (two) times daily.       Discharge Instructions: Please refer to Patient Instructions section of EMR for full details.  Patient was counseled important signs and symptoms that should prompt return to medical care, changes in medications, dietary instructions, activity restrictions, and follow up appointments.   Follow-Up Appointments: Follow-up Information    McKeag, Janine OresIan D, MD Follow up on 07/03/2016.   Specialty:  Family Medicine Why:  at 4:15 pm for hospital follow up.   Contact information: [address] [place] [address] [phone]           Garth Bignessimberlake, Kathryn, MD 06/29/2016, 7:16 AM PGY-1, [facility]

## 2016-06-29 ENCOUNTER — Other Ambulatory Visit: Payer: Self-pay | Admitting: Family Medicine

## 2016-06-30 MED ORDER — INSULIN GLARGINE 100 UNIT/ML ~~LOC~~ SOLN: 18.0000 [IU] | Freq: Every day | SUBCUTANEOUS | 11 refills | Status: DC

## 2016-06-30 MED ORDER — INSULIN ASPART 100 UNIT/ML ~~LOC~~ SOLN: 0.0000 [IU] | Freq: Three times a day (TID) | SUBCUTANEOUS | 11 refills | Status: DC

## 2016-06-30 NOTE — Progress Notes (Unsigned)
[place] [facility] [facility] via USAAFax. Please call patient and let her know that this was done. She can give us a call if she has any questions. She has samples of both of these insulins. It may take them a day or so to get these prescriptions.

## 2016-07-03 ENCOUNTER — Inpatient Hospital Stay: Payer: Medicaid Other | Admitting: Family Medicine

## 2016-07-03 NOTE — Progress Notes (Unsigned)
Pt was informed of this on 06/30/16. [name], April D, [place]

## 2016-07-04 ENCOUNTER — Inpatient Hospital Stay: Payer: Medicaid Other | Admitting: Family Medicine

## 2016-07-10 ENCOUNTER — Encounter: Payer: Self-pay | Admitting: Family Medicine

## 2016-07-10 ENCOUNTER — Ambulatory Visit (INDEPENDENT_AMBULATORY_CARE_PROVIDER_SITE_OTHER): Payer: Self-pay | Admitting: Family Medicine

## 2016-07-10 VITALS — BP 112/78 | HR 88 | Temp 98.6°F | Wt 166.0 lb

## 2016-07-10 DIAGNOSIS — Z79899 Other long term (current) drug therapy: Secondary | ICD-10-CM

## 2016-07-10 DIAGNOSIS — E103299 Type 1 diabetes mellitus with mild nonproliferative diabetic retinopathy without macular edema, unspecified eye: Secondary | ICD-10-CM

## 2016-07-10 DIAGNOSIS — M25522 Pain in left elbow: Secondary | ICD-10-CM

## 2016-07-10 DIAGNOSIS — E1065 Type 1 diabetes mellitus with hyperglycemia: Secondary | ICD-10-CM

## 2016-07-10 LAB — POCT GLYCOSYLATED HEMOGLOBIN (HGB A1C): Hemoglobin A1C: 7.9

## 2016-07-10 LAB — POCT URINE PREGNANCY: Preg Test, Ur: NEGATIVE

## 2016-07-10 NOTE — Patient Instructions (Signed)
It was a pleasure seeing you today in our clinic. Today we discussed your diabetes. Here is the treatment plan we have discussed and agreed upon together:   - As we discussed today in the clinic out rather you take the insulin 70/30 if you feel more comfortable taking it then to only sporadically taking Lantus. - Continue taking insulin 70/30, 25 units in the morning and 18 units in the afternoon. - I've ordered x-rays of your left elbow. Please have this done within the next week. If these images are negative for any evidence of bone malformation/deformity than I will likely be ordering an ultrasound of that arm as well. I will contact you later this week once I have these results.

## 2016-07-10 NOTE — Assessment & Plan Note (Addendum)
Stable: Patient was not compliant with Lantus and NovoLog. She has gone back to insulin 70/30. I informed her that as long a she continued to take this regularly then I would support her decision. - Insulin 70/30: 25 units in the a.m., 18 units in the p.m.  - I have asked patient to be consistent with this dosage so that an educated decision can be made based on her A1c and fasting glucose levels. - Repeat BMP to assess kidney injury

## 2016-07-10 NOTE — Assessment & Plan Note (Signed)
Patient is complaining of left-sided elbow pain. Physical exam yielded significant reduction in range of motion with supination and pronation. Concern for injury to the radial head. Also, some non-focal swelling was noted. This, with her slightly diminished radial pulse compared to the right side may warrant vascular ultrasound. - X-ray left elbow - If there is no evidence of bony injury/malplacement that could support her current findings on exam then strong consideration for left arm venous and arterial ultrasound assessments. - Will call patient with results and further plan.

## 2016-07-10 NOTE — Progress Notes (Signed)
HPI  CC: Hosp F/U Patient is here for hospital follow-up for DKA. She states that she feels well since discharge. However she was discharged with instructions to take Lantus and NovoLog for glucose control. She states that she tried to be compliant with this regimen but was unable to do so with the short acting. Because of this she has gone back to taking her insulin 70/30 twice a day. She states that she feels better and that her CBGs have been under better control on this regimen. She currently takes 23-25 units in the morning and 18 units at night. Fasting CBGs have been around 140 in the morning.  Left arm pain: Patient is also complaining of left-sided arm pain. She states that this is been going on since before her recent admission. Approximately 2 months. She denies any injuries or trauma. She states that she can no longer lift heavy objects with this arm. The pain restricts her from any and all overhead activities however she is able to reach into her back pocket. Most discomfort is noted with supination and pronation. Pain is localized primarily over the left elbow but its commonly affects the majority of the forearm as well. She also states that this arm "gets colder more than the other arm". She denies any numbness, paresthesias, or weakness. She denies any swelling, erythema, or ecchymosis. No fevers. Patient is right-hand dominant.  Review of Systems See HPI for ROS.   CC, SH/smoking status, and VS noted  Objective: BP 112/78   Pulse 88   Temp 98.6 F (37 C) (Oral)   Wt 166 lb (75.3 kg)   LMP 06/19/2016   BMI 25.24 kg/m  Gen: NAD, alert, cooperative, and pleasant. HEENT: NCAT, EOMI, PERRL, MMM CV: RRR, no murmur Resp: CTAB, no wheezes, non-labored Elbow, left: Slight swelling over the proximal radial head and lateral epicondyles. No erythema or ecchymosis. No evidence of effusion. TTP along the majority of the lateral aspect of the elbow specifically along the radial head  and proximal shaft. Active flexion and extension at the elbow is minimally reduced at its extremes. Active and passive supination and pronation dramatically reduced 2/2 pain. Strength intact throughout. Sensation intact throughout. Peripheral pulses intact however radial pulse slightly diminished compared to right side.   Assessment and plan:  Diabetes mellitus type 1, uncontrolled, insulin dependent (HCC) Stable: Patient was not compliant with Lantus and NovoLog. She has gone back to insulin 70/30. I informed her that as long a she continued to take this regularly then I would support her decision. - Insulin 70/30: 25 units in the a.m., 18 units in the p.m.  - I have asked patient to be consistent with this dosage so that an educated decision can be made based on her A1c and fasting glucose levels. - Repeat BMP to assess kidney injury  Left elbow pain Patient is complaining of left-sided elbow pain. Physical exam yielded significant reduction in range of motion with supination and pronation. Concern for injury to the radial head. Also, some non-focal swelling was noted. This, with her slightly diminished radial pulse compared to the right side may warrant vascular ultrasound. - X-ray left elbow - If there is no evidence of bony injury/malplacement that could support her current findings on exam then strong consideration for left arm venous and arterial ultrasound assessments. - Will call patient with results and further plan.   Orders Placed This Encounter  Procedures  . DG Elbow Complete Left    Standing Status:  Future    Standing Expiration Date:   09/09/2017    Order Specific Question:   Reason for Exam (SYMPTOM  OR DIAGNOSIS REQUIRED)    Answer:   pain/reduced ROM    Order Specific Question:   Is patient pregnant?    Answer:   No    Order Specific Question:   Preferred imaging location?    Answer:   [facility]    Order Specific Question:   Radiology Contrast Protocol  - do NOT remove file path    Answer:   [email or web address]  . Basic metabolic panel  . POCT HgB A1C (CPT 83036)  . POCT urine pregnancy    Kathee Delton, MD,MS,  PGY3 07/10/2016 6:04 PM

## 2016-07-11 LAB — BASIC METABOLIC PANEL
BUN / CREAT RATIO: 5 — AB (ref 9–23)
BUN: 4 mg/dL — AB (ref 6–20)
CHLORIDE: 103 mmol/L (ref 96–106)
CO2: 24 mmol/L (ref 20–29)
Calcium: 9.5 mg/dL (ref 8.7–10.2)
Creatinine, Ser: 0.87 mg/dL (ref 0.57–1.00)
GFR, EST AFRICAN AMERICAN: 100 mL/min/{1.73_m2} (ref >59)
GFR, EST NON AFRICAN AMERICAN: 87 mL/min/{1.73_m2} (ref >59)
Glucose: 54 mg/dL — ABNORMAL LOW (ref 65–99)
POTASSIUM: 4.3 mmol/L (ref 3.5–5.2)
Sodium: 141 mmol/L (ref 134–144)

## 2016-07-24 ENCOUNTER — Other Ambulatory Visit: Payer: Self-pay

## 2016-08-03 ENCOUNTER — Ambulatory Visit
Admission: RE | Admit: 2016-08-03 | Discharge: 2016-08-03 | Disposition: A | Discharge status: Home / Self Care | Payer: Self-pay | Source: Ambulatory Visit | Attending: Family Medicine | Admitting: Family Medicine

## 2016-08-03 DIAGNOSIS — M25522 Pain in left elbow: Secondary | ICD-10-CM

## 2016-08-04 ENCOUNTER — Telehealth: Payer: Self-pay | Admitting: Family Medicine

## 2016-08-04 NOTE — Telephone Encounter (Signed)
Pt called and would like to know what the results were and would like to speak to the doctor about a letter she needs. jw
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
## 2016-08-04 NOTE — Telephone Encounter (Signed)
Please call patient and let her know that her elbow x-ray was normal. If she is still having elbow pain, please have her make an appointment to be seen with me next week to talk about additional plans or imaging. Rachel MuseKate Mellissa Conley, MD

## 2016-08-11 ENCOUNTER — Encounter: Payer: Self-pay | Admitting: Family Medicine

## 2016-08-11 ENCOUNTER — Ambulatory Visit (INDEPENDENT_AMBULATORY_CARE_PROVIDER_SITE_OTHER): Payer: Self-pay | Admitting: Family Medicine

## 2016-08-11 VITALS — BP 118/64 | HR 51 | Temp 98.1°F | Ht 68.0 in | Wt 166.2 lb

## 2016-08-11 DIAGNOSIS — G8929 Other chronic pain: Secondary | ICD-10-CM

## 2016-08-11 DIAGNOSIS — M25512 Pain in left shoulder: Secondary | ICD-10-CM

## 2016-08-11 MED ORDER — METHYLPREDNISOLONE ACETATE 80 MG/ML IJ SUSP
80.0000 mg | Freq: Once | INTRAMUSCULAR | Status: AC
  Administered 2016-08-11: 40 mg via INTRAMUSCULAR

## 2016-08-11 NOTE — Patient Instructions (Signed)
It was a pleasure to see you today! Thank you for choosing [facility] for your primary care. [name] was seen for arm pain and letter.   Our plans for today were:  Please call if you need additional information regarding court and have them specific in writing what they need.   For your arm pain, call if it is not better in 2 weeks, and I will refer you to sports medicine.   You should return to our clinic to see Dr. Chanetta Marshallimberlake in 2 months for diabetes.   Best,  Dr. Chanetta Marshallimberlake
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
## 2016-08-14 DIAGNOSIS — G8929 Other chronic pain: Secondary | ICD-10-CM | POA: Insufficient documentation

## 2016-08-14 DIAGNOSIS — M25512 Pain in left shoulder: Principal | ICD-10-CM

## 2016-08-14 NOTE — Assessment & Plan Note (Signed)
Not improving. Hx of same x 1 year, pain throughout her left arm. She cannot recall an injury. Has tried PO antinflammatories. Recent L elbow XR independently reviewed by myself without abnormality. Will give steroid/lidocaine injection to subacromial bursa today for bursitis vs rotator cuff injury. If pain improves with this, expect bursitis was cause. If no change in pain, will refer to sports medicine for US guided glenohumeral injection.

## 2016-09-11 ENCOUNTER — Telehealth: Payer: Self-pay | Admitting: Family Medicine

## 2016-09-11 NOTE — Telephone Encounter (Signed)
Pt calling to request refill of:  Name of Medication(s):  Valtrex and 70/30 Novolog  Last date of OV:  08-11-16 Pharmacy:  [facility]   Will route refill request to Clinic RN.  Discussed with patient policy to call pharmacy for future refills.  Also, discussed refills may take up to 48 hours to approve or deny.  Markus JarvisEmily C Pittman

## 2016-09-12 NOTE — Telephone Encounter (Signed)
Dr. Chanetta Marshallimberlake, did you try to call this patient? [name], CMA

## 2016-09-12 NOTE — Telephone Encounter (Signed)
Pt is returning a call from us. She said that we called her emergency contact. She doesn't understand why we would do this. Please call her back and never call her emergency number unless it is a real emergency. jw

## 2016-09-13 ENCOUNTER — Other Ambulatory Visit: Payer: Self-pay | Admitting: Family Medicine

## 2016-09-13 MED ORDER — INSULIN NPH ISOPHANE & REGULAR (70-30) 100 UNIT/ML ~~LOC~~ SUSP: 23.0000 [IU] | Freq: Two times a day (BID) | SUBCUTANEOUS | 11 refills | Status: DC

## 2016-09-13 MED ORDER — VALACYCLOVIR HCL 1 G PO TABS: 1000.0000 mg | ORAL_TABLET | Freq: Two times a day (BID) | ORAL | 0 refills | Status: DC

## 2016-09-13 NOTE — Progress Notes (Signed)
Patient states she uses 23U in the am, 15-20U of novolin 70/30 insulin. Regarding her previous call, I did not previously try to call her. Offered an appt to clarify her insulin regimen, she declined. I am sending a new script with the above regimen to the [facility] pharmacy. Patient has enough insulin for tonight and tomorrow, she will call if she has trouble getting the prescription tomorrow.

## 2016-09-15 ENCOUNTER — Telehealth: Payer: Self-pay | Admitting: *Deleted

## 2016-09-15 MED ORDER — VALACYCLOVIR HCL 1 G PO TABS: 1000.0000 mg | ORAL_TABLET | Freq: Two times a day (BID) | ORAL | 0 refills | Status: DC

## 2016-09-15 MED ORDER — INSULIN ASPART 100 UNIT/ML ~~LOC~~ SOLN: 0.0000 [IU] | Freq: Three times a day (TID) | SUBCUTANEOUS | 11 refills | Status: DC

## 2016-09-15 NOTE — Telephone Encounter (Signed)
Pt calls because she is upset she cant pick up her meds from the MAP program.  States she was told she could go to the MAP program back in may by one of the nurses at the hospital.     I attempted to explain to her that she must apply for MAP before she could use it and that is she would stop by here I could get her a list of the info she needed.  Patient is very agitated at this moment and ask that we  "JUST SEND IT TO OUTPATIENT PHARMACY" and hung up! Medications sent as requested.  Inanna Telford, Maryjo Rochester, CMA

## 2016-09-19 ENCOUNTER — Telehealth: Payer: Self-pay | Admitting: *Deleted

## 2016-09-19 NOTE — Telephone Encounter (Signed)
Prior Authorization received from [facility] pharmacy for [employer]. Formulary preferred by Medicaid is Humalog vials. Please change to Humalog vials.  Clovis Pu, RN

## 2016-09-20 ENCOUNTER — Other Ambulatory Visit: Payer: Self-pay | Admitting: Family Medicine

## 2016-09-20 MED ORDER — INSULIN NPH ISOPHANE & REGULAR (70-30) 100 UNIT/ML ~~LOC~~ SUSP: 23.0000 [IU] | Freq: Two times a day (BID) | SUBCUTANEOUS | 11 refills | Status: DC

## 2016-09-20 NOTE — Telephone Encounter (Signed)
Patient told me when I most recently spoke to her that she only uses 70/30 insulin (Novolin). Rather than changing her novolog, I have canceled both her old novolog and lantus prescriptions at the Outpatient pharmacy, and I will send her Novolin 70/30 to the outpatient pharmacy, which she uses BID. They let me know that their cash price is $152, and I am not sure how Medicaid will cover this particular insulin. Please call and let her know that she is welcome to try to fill the novolin to see if it is cheaper than at [facility] (I believe it is ~$30 there over the counter). She is also welcome to be seen in Nurse clinic or with [facility] to get help applying for MAP, whichever is appropriate.

## 2016-09-20 NOTE — Addendum Note (Signed)
Addended by: Shon Hale on: 09/20/2016 11:14 AM   Modules accepted: Orders

## 2016-09-20 NOTE — Telephone Encounter (Signed)
Novolin 70/30 is non-preferred, only preferred medication is Humulin 70/30.  PA will be required for the Novolon 70/30.  If patient has medicaid, she will not qualify for MAP.  Patient will need to contact Annice Pih for financial counseling.  Clovis Pu, RN

## 2016-09-20 NOTE — Progress Notes (Addendum)
Resent humalin to outpatient pharmacy as noted in telephone encounter.

## 2016-09-20 NOTE — Telephone Encounter (Signed)
Thanks, I will change to humalin 70/30 and see if that is cheaper for her at outpatient pharmacy.

## 2016-09-26 ENCOUNTER — Ambulatory Visit (INDEPENDENT_AMBULATORY_CARE_PROVIDER_SITE_OTHER): Payer: Self-pay | Admitting: *Deleted

## 2016-09-26 DIAGNOSIS — Z111 Encounter for screening for respiratory tuberculosis: Secondary | ICD-10-CM

## 2016-09-26 NOTE — Progress Notes (Signed)
PPD placed @ 4:25pm on 09/26/16.  Pt to return Friday 09/28/16 @ 1:30 for the read.  Will need a letter for work. Diannie Willner, Maryjo Rochester, CMA

## 2016-09-26 NOTE — Telephone Encounter (Signed)
Contacted pt and gave her the below information and she stated that she only had Family Planning Medicaid, she said that she went to health dept and they did not have her medicine and that she was pressed for time, she said that she went and picked up her Rx from [facility] because Outpatient pharmacy said it was not any cheaper. Lamonte Sakai, April D, [place]

## 2016-09-29 ENCOUNTER — Ambulatory Visit (INDEPENDENT_AMBULATORY_CARE_PROVIDER_SITE_OTHER): Payer: Self-pay | Admitting: *Deleted

## 2016-09-29 DIAGNOSIS — Z111 Encounter for screening for respiratory tuberculosis: Secondary | ICD-10-CM

## 2016-09-29 LAB — TB SKIN TEST
INDURATION: 0 mm
TB Skin Test: NEGATIVE

## 2016-09-29 NOTE — Progress Notes (Signed)
Pt is here for a PPD read.  Area of placement is not raised and no redness.  Letter given to patient for work. [name], Maryjo RochesterJessica Dawn, CMA

## 2016-10-26 ENCOUNTER — Ambulatory Visit: Payer: Medicaid Other

## 2016-11-08 ENCOUNTER — Other Ambulatory Visit (HOSPITAL_COMMUNITY)
Admission: RE | Admit: 2016-11-08 | Discharge: 2016-11-08 | Disposition: A | Discharge status: Home / Self Care | Payer: Medicaid Other | Source: Ambulatory Visit | Attending: Family Medicine | Admitting: Family Medicine

## 2016-11-08 ENCOUNTER — Ambulatory Visit (INDEPENDENT_AMBULATORY_CARE_PROVIDER_SITE_OTHER): Payer: Self-pay | Admitting: Internal Medicine

## 2016-11-08 ENCOUNTER — Encounter: Payer: Self-pay | Admitting: Internal Medicine

## 2016-11-08 VITALS — BP 118/62 | HR 90 | Temp 98.6°F | Ht 68.0 in | Wt 160.4 lb

## 2016-11-08 DIAGNOSIS — Z113 Encounter for screening for infections with a predominantly sexual mode of transmission: Secondary | ICD-10-CM | POA: Insufficient documentation

## 2016-11-08 DIAGNOSIS — N898 Other specified noninflammatory disorders of vagina: Secondary | ICD-10-CM | POA: Insufficient documentation

## 2016-11-08 DIAGNOSIS — Z23 Encounter for immunization: Secondary | ICD-10-CM

## 2016-11-08 DIAGNOSIS — L659 Nonscarring hair loss, unspecified: Secondary | ICD-10-CM

## 2016-11-08 LAB — POCT WET PREP (WET MOUNT)
Clue Cells Wet Prep Whiff POC: NEGATIVE
TRICHOMONAS WET PREP HPF POC: ABSENT

## 2016-11-08 MED ORDER — FLUCONAZOLE 150 MG PO TABS: ORAL_TABLET | ORAL | 0 refills | Status: DC

## 2016-11-08 MED ORDER — CLINDAMYCIN PHOS-BENZOYL PEROX 1-5 % EX GEL: Freq: Two times a day (BID) | CUTANEOUS | 1 refills | Status: DC

## 2016-11-08 NOTE — Progress Notes (Signed)
[name] Progress Note  Subjective:  [name] is a 36 y.o. female with history of arthritis, headaches, T1DM and HTN who presents for concerns of vaginal discharge and hair loss.   #Vaginal discharge - has noted increased discharge over the last several of days - somewhat uncomfortable during sex - does have a new partner - self-treated with monistat without improvement - hx of tubal ligation ROS: No fevers  #Hair loss - ongoing for months at various spots on her scalp - her hairdresser suggested getting testing - requests sampling for fungus - has not been relaxing her hair recently yet still has hair loss - has some tenderness where hair is gone - has not tried anything beyond avoiding relaxing her hair ROS: No increased fatigue or skin dryness   Allergies  Allergen Reactions  . Levonorgestrel-Ethinyl Estrad Cough    Sneezing, headaches.    Objective: Blood pressure 118/62, pulse 90, temperature 98.6 F (37 C), temperature source Oral, height  (1.727 m), weight 160 lb 6.4 oz (72.8 kg), SpO2 99 %. Body mass index is 24.39 kg/m. Constitutional: Well-appearing female in NAD HENT/skin: about 2 distinct ~1.5 cm patches of hair loss without fluctuance but slight TTP; one on right front of scalp and other left occipital region Abdominal: Soft. +BS, NT, ND GU: Moderate amount of white discharge on speculum exam. No cervical motion tenderness on bimanual exam. Chaperone present.  Vitals reviewed  Assessment/Plan: Vaginal discharge - Wet prep positive for yeast. Ordered diflucan. - Ordered gc/chlamydia testing, HIV and RPR - Recommended condom use - Patient does not douche or use fragranced soaps  Hair loss - Suspect folliculitis given some tenderness at site of hair loss vs 2/2 history of relaxing hair with harsh chemicals - Obtained KOH sample at patient's request but not completed. Low suspicion as no flaking or scale.  - Recommended trying  benzaclin gel BID on spots to treat as folliculitis  Follow-up if symptoms do not improve. Consider [facility] dermatology referral if no improvement on benzaclin.   Dani Gobble, MD [name], PGY-3

## 2016-11-08 NOTE — Patient Instructions (Signed)
Hi Ms. Sledd,  I will call you with some of your results later today. The rest should be available by the end of the week.  I will let you know about the fungal scraping later today, too.  Best, Dr. Sampson Goon

## 2016-11-09 ENCOUNTER — Encounter: Payer: Self-pay | Admitting: Internal Medicine

## 2016-11-09 DIAGNOSIS — L659 Nonscarring hair loss, unspecified: Secondary | ICD-10-CM | POA: Insufficient documentation

## 2016-11-09 DIAGNOSIS — N898 Other specified noninflammatory disorders of vagina: Secondary | ICD-10-CM | POA: Insufficient documentation

## 2016-11-09 LAB — CERVICOVAGINAL ANCILLARY ONLY
CHLAMYDIA, DNA PROBE: NEGATIVE
NEISSERIA GONORRHEA: NEGATIVE

## 2016-11-09 LAB — HIV ANTIBODY (ROUTINE TESTING W REFLEX): HIV SCREEN 4TH GENERATION: NONREACTIVE

## 2016-11-09 LAB — RPR: RPR Ser Ql: NONREACTIVE

## 2016-11-09 NOTE — Assessment & Plan Note (Addendum)
-   Suspect folliculitis given some tenderness at site of hair loss vs 2/2 history of relaxing hair with harsh chemicals - Obtained KOH sample at patient's request but not completed. Low suspicion as no flaking or scale.  - Recommended trying benzaclin gel BID on spots to treat as folliculitis

## 2016-11-09 NOTE — Assessment & Plan Note (Addendum)
-   Wet prep positive for yeast. Ordered diflucan. - Ordered gc/chlamydia testing, HIV and RPR - Recommended condom use - Patient does not douche or use fragranced soaps

## 2017-02-02 ENCOUNTER — Telehealth: Payer: Self-pay | Admitting: *Deleted

## 2017-02-02 MED ORDER — METRONIDAZOLE 500 MG PO TABS: 500.0000 mg | ORAL_TABLET | Freq: Two times a day (BID) | ORAL | 0 refills | Status: DC

## 2017-02-02 MED FILL — metroNIDAZOLE 500 MG TABS: 500 | 7 days supply | Qty: 14 | Fill #0

## 2017-02-02 MED FILL — valACYclovir HCL 1 GM TABS: 1 | 10 days supply | Qty: 20 | Fill #0

## 2017-02-02 MED FILL — FLUCONAZOLE 150 MG TABLET: 150 | 6 days supply | Qty: 2 | Fill #0

## 2017-02-02 NOTE — Telephone Encounter (Signed)
Patient reports fishy odor and discharge consistent with previous episode of BV. She also has had a yeast infection in the past. I tried to call the patient but there was no answer and no option to LVM.  Please call and let patient know: I will send metronidazole course this once for BV because patient knows her previous symptoms. However, if her symptoms persist she will need to come in for an office visit to be evaluated in person for other causes of her symptoms.  Howard PouchLauren Hristopher Missildine, MD PGY-2 [facility]

## 2017-02-02 NOTE — Telephone Encounter (Signed)
Patient left message on nurse line requesting antibiotic for recurrent BV. Called patient, reports 2-3 week hx of whitish-brownish vag discharge and fishy odor. States she no longer has insurance so is unable to make OV. Uses [facility]. Kinnie FeilL. Emersyn Kotarski, RN, BSN

## 2017-02-02 NOTE — Telephone Encounter (Signed)
Pt informed of below. Zimmerman Rumple, Sayvon Arterberry D, CMA  

## 2017-02-02 NOTE — Telephone Encounter (Signed)
Patient reports fishy odor and discharge consistent with previous episode of BV. She also has had a yeast infection in the past. I tried to call the patient but there was no answer and no option to LVM.  I will send metronidazole course this once for BV because patient knows her previous symptoms. However, if her symptoms persist she will need to come in for an office visit to be evaluated in person for other causes of her symptoms.  Howard PouchLauren Conchetta Lamia, MD PGY-2 [facility]

## 2017-02-04 ENCOUNTER — Other Ambulatory Visit: Payer: Self-pay

## 2017-02-04 ENCOUNTER — Emergency Department (HOSPITAL_COMMUNITY): Payer: Medicaid Other

## 2017-02-04 ENCOUNTER — Emergency Department (HOSPITAL_COMMUNITY)
Admission: EM | Admit: 2017-02-04 | Discharge: 2017-02-05 | Disposition: A | Discharge status: Home / Self Care | Payer: Medicaid Other | Attending: Emergency Medicine | Admitting: Emergency Medicine

## 2017-02-04 ENCOUNTER — Encounter (HOSPITAL_COMMUNITY): Payer: Self-pay | Admitting: *Deleted

## 2017-02-04 DIAGNOSIS — R1013 Epigastric pain: Secondary | ICD-10-CM | POA: Insufficient documentation

## 2017-02-04 DIAGNOSIS — K2971 Gastritis, unspecified, with bleeding: Secondary | ICD-10-CM | POA: Insufficient documentation

## 2017-02-04 DIAGNOSIS — D649 Anemia, unspecified: Secondary | ICD-10-CM | POA: Insufficient documentation

## 2017-02-04 DIAGNOSIS — F17210 Nicotine dependence, cigarettes, uncomplicated: Secondary | ICD-10-CM | POA: Insufficient documentation

## 2017-02-04 DIAGNOSIS — I10 Essential (primary) hypertension: Secondary | ICD-10-CM | POA: Insufficient documentation

## 2017-02-04 DIAGNOSIS — Z79899 Other long term (current) drug therapy: Secondary | ICD-10-CM | POA: Insufficient documentation

## 2017-02-04 DIAGNOSIS — E109 Type 1 diabetes mellitus without complications: Secondary | ICD-10-CM | POA: Insufficient documentation

## 2017-02-04 DIAGNOSIS — Z794 Long term (current) use of insulin: Secondary | ICD-10-CM | POA: Insufficient documentation

## 2017-02-04 LAB — URINALYSIS, ROUTINE W REFLEX MICROSCOPIC
BILIRUBIN URINE: NEGATIVE
Ketones, ur: 20 mg/dL — AB
LEUKOCYTES UA: NEGATIVE
Nitrite: NEGATIVE
PH: 6 (ref 5.0–8.0)
Protein, ur: 30 mg/dL — AB
SPECIFIC GRAVITY, URINE: 1.017 (ref 1.005–1.030)

## 2017-02-04 LAB — CBG MONITORING, ED: Glucose-Capillary: 299 mg/dL — ABNORMAL HIGH (ref 65–99)

## 2017-02-04 LAB — COMPREHENSIVE METABOLIC PANEL
ALBUMIN: 4.6 g/dL (ref 3.5–5.0)
ALT: 17 U/L (ref 14–54)
ANION GAP: 13 (ref 5–15)
AST: 23 U/L (ref 15–41)
Alkaline Phosphatase: 57 U/L (ref 38–126)
BILIRUBIN TOTAL: 1 mg/dL (ref 0.3–1.2)
BUN: 15 mg/dL (ref 6–20)
CO2: 25 mmol/L (ref 22–32)
Calcium: 9.3 mg/dL (ref 8.9–10.3)
Chloride: 100 mmol/L — ABNORMAL LOW (ref 101–111)
Creatinine, Ser: 1.3 mg/dL — ABNORMAL HIGH (ref 0.44–1.00)
GFR calc Af Amer: >60 mL/min (ref >60)
GFR calc non Af Amer: 52 mL/min — ABNORMAL LOW (ref >60)
GLUCOSE: 329 mg/dL — AB (ref 65–99)
POTASSIUM: 3.5 mmol/L (ref 3.5–5.1)
Sodium: 138 mmol/L (ref 135–145)
TOTAL PROTEIN: 8.5 g/dL — AB (ref 6.5–8.1)

## 2017-02-04 LAB — CBC WITH DIFFERENTIAL/PLATELET
BASOS PCT: 0 %
Basophils Absolute: 0 10*3/uL (ref 0.0–0.1)
EOS ABS: 0 10*3/uL (ref 0.0–0.7)
EOS PCT: 0 %
HEMATOCRIT: 37.8 % (ref 36.0–46.0)
Hemoglobin: 12.9 g/dL (ref 12.0–15.0)
Lymphocytes Relative: 9 %
Lymphs Abs: 1.2 10*3/uL (ref 0.7–4.0)
MCH: 30.7 pg (ref 26.0–34.0)
MCHC: 34.1 g/dL (ref 30.0–36.0)
MCV: 90 fL (ref 78.0–100.0)
MONO ABS: 1.5 10*3/uL — AB (ref 0.1–1.0)
MONOS PCT: 10 %
NEUTROS ABS: 11.5 10*3/uL — AB (ref 1.7–7.7)
Neutrophils Relative %: 81 %
Platelets: 286 10*3/uL (ref 150–400)
RBC: 4.2 MIL/uL (ref 3.87–5.11)
RDW: 15.1 % (ref 11.5–15.5)
WBC: 14.2 10*3/uL — ABNORMAL HIGH (ref 4.0–10.5)

## 2017-02-04 LAB — PREGNANCY, URINE: Preg Test, Ur: NEGATIVE

## 2017-02-04 LAB — I-STAT BETA HCG BLOOD, ED (MC, WL, AP ONLY): HCG, QUANTITATIVE: 8.6 m[IU]/mL — AB (ref <5)

## 2017-02-04 MED ORDER — SODIUM CHLORIDE 0.9 % IV BOLUS (SEPSIS)
1000.0000 mL | Freq: Once | INTRAVENOUS | Status: AC
  Administered 2017-02-04: 1000 mL via INTRAVENOUS

## 2017-02-04 MED ORDER — IOPAMIDOL (ISOVUE-300) INJECTION 61%
INTRAVENOUS | Status: AC
  Administered 2017-02-04: 100 mL via INTRAVENOUS
  Filled 2017-02-04: qty 100

## 2017-02-04 MED ORDER — MORPHINE SULFATE (PF) 4 MG/ML IV SOLN
4.0000 mg | Freq: Once | INTRAVENOUS | Status: AC
  Administered 2017-02-04: 4 mg via INTRAVENOUS
  Filled 2017-02-04: qty 1

## 2017-02-04 MED ORDER — PROMETHAZINE HCL 25 MG/ML IJ SOLN
25.0000 mg | Freq: Once | INTRAMUSCULAR | Status: AC
  Administered 2017-02-04: 25 mg via INTRAVENOUS
  Filled 2017-02-04: qty 1

## 2017-02-04 MED ORDER — METOCLOPRAMIDE HCL 5 MG/ML IJ SOLN
10.0000 mg | Freq: Once | INTRAMUSCULAR | Status: AC
  Administered 2017-02-04: 10 mg via INTRAVENOUS
  Filled 2017-02-04: qty 2

## 2017-02-04 NOTE — ED Notes (Signed)
No respiratory or acute distress noted resting in bed with eyes closed no reaction to medication noted call light in reach. 

## 2017-02-04 NOTE — ED Notes (Signed)
Bed: [id number] Expected date: 02/04/17 Expected time:  Means of arrival:  Comments: 37 yo emesis, hyperglycemia

## 2017-02-04 NOTE — ED Provider Notes (Signed)
[facility]-EMERGENCY DEPT Provider Note   CSN: [id number] Arrival date & time: 02/04/17  1814     History   Chief Complaint Chief Complaint  Patient presents with  . Hematemesis    HPI [name] is a 37 y.o. female with past medical history of poorly controlled type 1 diabetes, hypertension, GERD, hyperlipidemia, gastroparesis, presented to the ED with 3 days of generalized crampy abdominal pain, nausea, and vomiting.  Patient states symptoms did not begin until after she began taking metronidazole and Valtrex as prescribed by her PCP.  She states she only took 1 dose and has not had any since.  She states she is unsure if her symptoms feel similar to her gastroparesis at this time.  Does note some episodes of dark red blood in her vomit today, that became bright red following that.  She also reports melena with some diarrhea that began today.  Took Zofran at home without relief of nausea.  Does not check her blood sugar regularly, however states she takes her insulin daily, Novolin 70/30.  No history of abdominal surgeries.  The history is provided by the patient.    Past Medical History:  Diagnosis Date  . Arthritis    "legs and hands" (08/21/2012)  . Chronic bronchitis (HCC)    "get it q yr" (08/21/2012)  . Exertional shortness of breath   . GERD (gastroesophageal reflux disease)   . WUJWJXBJ(478.2Headache(784.0)    "weekly" (08/21/2012)  . Hyperlipidemia   . Hypertension   . Incarceration    Was in jail for selling drugs - out Oct 2010.  [name]. Renal disorder   . Tobacco abuse   . Type I diabetes mellitus (HCC)    A1c > 11. Patient does not check BG or take insulin regularly.  Dx at age 89.    Patient Active Problem List   Diagnosis Date Noted  . Vaginal discharge 11/09/2016  . Hair loss 11/09/2016  . Chronic left shoulder pain 08/14/2016  . Left elbow pain 07/10/2016  . DKA (diabetic ketoacidoses) (HCC) 06/27/2016  . DKA, type 1 (HCC) 06/26/2016  .  Otomycosis of left ear 09/15/2015  . Mild nonproliferative diabetic retinopathy (HCC) 07/25/2013  . Intractable cyclical vomiting with nausea 07/24/2013  . Contraception 04/16/2013  . Unspecified constipation 08/28/2012  . Anemia 04/19/2012  . Essential hypertension, benign 04/19/2012  . Gastroparesis 03/11/2012  . TRIGGER FINGER, RIGHT THUMB 05/24/2009  . TOBACCO USER 11/07/2008  . Diabetes mellitus type 1, uncontrolled, insulin dependent (HCC) 04/02/2006  . HYPERCHOLESTEROLEMIA 03/29/2006    Past Surgical History:  Procedure Laterality Date  . TUBAL LIGATION  2009    OB History    No data available       Home Medications    Prior to Admission medications   Medication Sig Start Date End Date Taking? Authorizing Provider  Blood Glucose Monitoring Suppl (ACCU-CHEK AVIVA) device 1 each by Other route 4 (four) times daily - after meals and at bedtime. Use as instructed 03/25/15  Yes Adamo, Jeris PentaElena M, MD  glucose blood (ACCU-CHEK AVIVA) test strip 1 each by Other route 4 (four) times daily - after meals and at bedtime. Use as instructed 03/25/15  Yes Adamo, Jeris PentaElena M, MD  insulin NPH-regular Human (NOVOLIN 70/30) (70-30) 100 UNIT/ML injection Inject 23 Units into the skin 2 (two) times daily with a meal. 23 Units in the am and 15-20 units in pm. 09/20/16  Yes Garth Bignessimberlake, Kathryn, MD  Lancet Devices [facility](ACCU-CHEK SOFTCLIX) lancets 1  each by Other route 4 (four) times daily - after meals and at bedtime. Use as instructed 03/25/15  Yes Adamo, Jeris Penta, MD  metroNIDAZOLE (FLAGYL) 500 MG tablet Take 1 tablet (500 mg total) by mouth 2 (two) times daily. 02/02/17  Yes Howard Pouch, MD  valACYclovir (VALTREX) 1000 MG tablet Take 1 tablet (1,000 mg total) by mouth 2 (two) times daily. 09/15/16  Yes Hensel, Santiago Bumpers, MD  cetirizine (ZYRTEC) 10 MG tablet Take 1 tablet (10 mg total) by mouth daily as needed for allergies. Patient not taking: Reported on 06/26/2016 09/14/15   Casey Burkitt, MD    clindamycin-benzoyl peroxide [facility]) gel Apply topically 2 (two) times daily. Patient not taking: Reported on 02/04/2017 11/08/16   Casey Burkitt, MD  fluconazole (DIFLUCAN) 150 MG tablet Take 1 tablet. If you still are having symptoms, repeat dose in 72 hours. Patient not taking: Reported on 02/04/2017 11/08/16   Casey Burkitt, MD  fluticasone [facility]) 50 MCG/ACT nasal spray Place 2 sprays into both nostrils daily. Patient not taking: Reported on 06/26/2016 08/31/15   Danelle Berry, PA-C  lisinopril (PRINIVIL,ZESTRIL) 10 MG tablet Take 1 tablet (10 mg total) by mouth daily. Patient not taking: Reported on 01/25/2015 09/25/12   Lonia Skinner, MD  metoCLOPramide (REGLAN) 10 MG tablet Take 1 tablet (10 mg total) by mouth 3 (three) times daily before meals. Patient not taking: Reported on 06/26/2016 12/28/15   McKeag, Janine Ores, MD    Family History No family history on file.  Social History Social History   Tobacco Use  . Smoking status: Current Every Day Smoker    Packs/day: 0.33    Years: 14.00    Pack years: 4.62    Types: Cigarettes  . Smokeless tobacco: Never Used  Substance Use Topics  . Alcohol use: Yes    Alcohol/week: 0.0 oz    Comment: 08/21/2012 "might drink 1-2 glasses of liquor once/month"  . Drug use: Yes    Types: Marijuana     Allergies   Levonorgestrel-ethinyl estrad   Review of Systems Review of Systems  Constitutional: Positive for appetite change. Negative for fever.  Gastrointestinal: Positive for abdominal pain, blood in stool, diarrhea, nausea and vomiting.  Genitourinary: Negative for dysuria and frequency.  Allergic/Immunologic: Positive for immunocompromised state.  All other systems reviewed and are negative.    Physical Exam Updated Vital Signs BP (!) 131/91 (BP Location: Left Arm)   Pulse 75   Temp 98.2 F (36.8 C) (Oral)   Resp 16   LMP 01/13/2017   SpO2 96%   Physical Exam  Constitutional: She appears  well-developed and well-nourished.  Patient appears uncomfortable.  HENT:  Head: Normocephalic and atraumatic.  Mouth/Throat: Oropharynx is clear and moist.  Eyes: Conjunctivae are normal.  Cardiovascular: Normal rate, regular rhythm, normal heart sounds and intact distal pulses.  Pulmonary/Chest: Effort normal and breath sounds normal.  Abdominal: Soft. Bowel sounds are normal. She exhibits no distension and no mass. There is tenderness (Epigastric). There is no rebound and no guarding.  Neurological: She is alert.  Skin: Skin is warm.  Psychiatric: She has a normal mood and affect. Her behavior is normal.  Nursing note and vitals reviewed.    ED Treatments / Results  Labs (all labs ordered are listed, but only abnormal results are displayed) Labs Reviewed  CBC WITH DIFFERENTIAL/PLATELET - Abnormal; Notable for the following components:      Result Value   WBC 14.2 (*)    Neutro  Abs 11.5 (*)    Monocytes Absolute 1.5 (*)    All other components within normal limits  COMPREHENSIVE METABOLIC PANEL - Abnormal; Notable for the following components:   Chloride 100 (*)    Glucose, Bld 329 (*)    Creatinine, Ser 1.30 (*)    Total Protein 8.5 (*)    GFR calc non Af Amer 52 (*)    All other components within normal limits  URINALYSIS, ROUTINE W REFLEX MICROSCOPIC - Abnormal; Notable for the following components:   Glucose, UA >=500 (*)    Hgb urine dipstick SMALL (*)    Ketones, ur 20 (*)    Protein, ur 30 (*)    Bacteria, UA RARE (*)    Squamous Epithelial / LPF 0-5 (*)    All other components within normal limits  CBG MONITORING, ED - Abnormal; Notable for the following components:   Glucose-Capillary 299 (*)    All other components within normal limits  I-STAT BETA HCG BLOOD, ED ([facility], [facility], [facility] ONLY) - Abnormal; Notable for the following components:   I-stat hCG, quantitative 8.6 (*)    All other components within normal limits  PREGNANCY, URINE  POC OCCULT BLOOD, ED     EKG  EKG Interpretation None       Radiology No results found.  Procedures Procedures (including critical care time)  Medications Ordered in ED Medications  sodium chloride 0.9 % bolus 1,000 mL (not administered)  promethazine (PHENERGAN) injection 25 mg (not administered)  sodium chloride 0.9 % bolus 1,000 mL (0 mLs Intravenous Stopped 02/04/17 2155)  metoCLOPramide (REGLAN) injection 10 mg (10 mg Intravenous Given 02/04/17 2016)  morphine 4 MG/ML injection 4 mg (4 mg Intravenous Given 02/04/17 2016)  iopamidol (ISOVUE-300) 61 % injection (100 mLs Intravenous Contrast Given 02/04/17 2308)     Initial Impression / Assessment and Plan / ED Course  I have reviewed the triage vital signs and the nursing notes.  Pertinent labs & imaging results that were available during my care of the patient were reviewed by me and considered in my medical decision making (see chart for details).     Patient is a type I diabetic with history of gastroparesis, presenting to the ED with 3 days of nausea, vomiting, diarrhea, and acute onset of hematemesis with melena today.  Patient also with diffuse abdominal cramping.  On exam, no guarding or rebound, however with epigastric tenderness. Afebrile, VSS. Labs obtained; no evidence of DKA.  White count slightly elevated 14.2.  AKI with creatinine 1.3.  Glucose 299 initially.  IV fluids given, pain treated with morphine, and nausea treated with Reglan with significant improvement.  CT abdomen/pelvis pending. Nausea returned following CT scan, phenergan given. Care assumed by Eyvonne Mechanic, PA-C, pending CT results, repeat CBG and re-evaluation. If scan neg, plan to PO challenge, and discharge with PPI and GI referral. Pt discussed with Dr. Silverio Lay, who agrees with care plan.  Final Clinical Impressions(s) / ED Diagnoses   Final diagnoses:  None    ED Discharge Orders    None       Robinson, [place] N, PA-C 02/04/17 2359    Charlynne Pander,  MD 02/05/17 1452

## 2017-02-04 NOTE — ED Triage Notes (Signed)
Pt BIB EMS and coming from home.  Pt presents to the ED after vomiting x 3 days.  Pt reported to EMS that she has been vomiting dark red blood and having dark stools.  EMS started an IV and gave 8mg  IV zofran without any relief.  EMS stated they saw the red emesis during transport.  Pt a/o x 4 and ambulatory.

## 2017-02-04 NOTE — ED Notes (Signed)
No respiratory or acute distress noted resting in bed with eyes closed call light in reach no reaction to medication noted. 

## 2017-02-04 NOTE — ED Provider Notes (Signed)
Patient care signed out to me at the time of shift change pending CT evaluation.  Please see previous providers note for full H&P.  In short patient with nausea vomiting.  Patient reports small amount of blood in her vomit.  She notes generalized abdominal pain which has now resolved; soft nontender abdomen.  She notes one bowel movement yesterday that was dark.  No significant GI bleed.  CT scan without acute life-threatening etiology, question mild colitis.  Patient is well-appearing in no acute distress.  She is tolerating p.o. and is afebrile.  Patient will be started on Prilosec, encouraged follow-up with her primary care this week for reevaluation.  Patient is a diabetic with slight elevation in glucose, no signs of DKA, hyperosmolar state.  Encouraged to continue using insulin.  Patient given strict return precautions, she verbalized understanding and agreement to today's plan had no further questions or concerns.  Vitals:   02/04/17 2222 02/05/17 0015  BP: (!) 131/91 (!) 169/93  Pulse: 75 94  Resp: 16 15  Temp: 98.2 F (36.8 C)   SpO2: 96% 100%     Eyvonne MechanicHedges, Myeshia Fojtik, PA-C 02/05/17 0106

## 2017-02-05 LAB — CBG MONITORING, ED: GLUCOSE-CAPILLARY: 238 mg/dL — AB (ref 65–99)

## 2017-02-05 MED ORDER — ONDANSETRON HCL 4 MG PO TABS: 4.0000 mg | ORAL_TABLET | Freq: Four times a day (QID) | ORAL | 0 refills | Status: DC

## 2017-02-05 MED ORDER — OMEPRAZOLE 20 MG PO CPDR: 20.0000 mg | DELAYED_RELEASE_CAPSULE | Freq: Every day | ORAL | 0 refills | Status: DC

## 2017-02-05 NOTE — Discharge Instructions (Signed)
Please read attached information. If you experience any new or worsening signs or symptoms please return to the emergency room for evaluation. Please follow-up with your primary care provider or specialist as discussed. Please use medication prescribed only as directed and discontinue taking if you have any concerning signs or symptoms.   °

## 2017-02-05 NOTE — ED Notes (Signed)
Pt did not sign discharge instruction walked to restroom with steady gait and went back to room refused to leave after being asked several times, had physician assistant talk to pt, had to get security involved after talking with Ilene RN who states ok to get security to remove pt from room to lobby. Alert and oriented x 3 no respiratory or acute distress noted steady gait noted.

## 2017-02-05 NOTE — ED Notes (Signed)
No respiratory or acute distress noted alert and oriented x 3 no reaction to medication noted call light in reach. 

## 2017-02-06 ENCOUNTER — Encounter (HOSPITAL_COMMUNITY): Payer: Self-pay | Admitting: Emergency Medicine

## 2017-02-06 ENCOUNTER — Emergency Department (HOSPITAL_COMMUNITY)
Admission: EM | Admit: 2017-02-06 | Discharge: 2017-02-06 | Disposition: A | Discharge status: Home / Self Care | Payer: Medicaid Other | Attending: Emergency Medicine | Admitting: Emergency Medicine

## 2017-02-06 ENCOUNTER — Other Ambulatory Visit: Payer: Self-pay

## 2017-02-06 DIAGNOSIS — Z5321 Procedure and treatment not carried out due to patient leaving prior to being seen by health care provider: Secondary | ICD-10-CM | POA: Insufficient documentation

## 2017-02-06 LAB — COMPREHENSIVE METABOLIC PANEL
ALBUMIN: 4.6 g/dL (ref 3.5–5.0)
ALK PHOS: 59 U/L (ref 38–126)
ALT: 20 U/L (ref 14–54)
ALT: 17 U/L (ref 14–54)
ANION GAP: 14 (ref 5–15)
ANION GAP: 13 (ref 5–15)
AST: 28 U/L (ref 15–41)
AST: 22 U/L (ref 15–41)
Albumin: 4.6 g/dL (ref 3.5–5.0)
Alkaline Phosphatase: 66 U/L (ref 38–126)
BUN: 17 mg/dL (ref 6–20)
BUN: 21 mg/dL — ABNORMAL HIGH (ref 6–20)
CALCIUM: 9.9 mg/dL (ref 8.9–10.3)
CALCIUM: 9.8 mg/dL (ref 8.9–10.3)
CHLORIDE: 101 mmol/L (ref 101–111)
CO2: 28 mmol/L (ref 22–32)
CO2: 28 mmol/L (ref 22–32)
Chloride: 103 mmol/L (ref 101–111)
Creatinine, Ser: 1.02 mg/dL — ABNORMAL HIGH (ref 0.44–1.00)
Creatinine, Ser: 1.07 mg/dL — ABNORMAL HIGH (ref 0.44–1.00)
GFR calc Af Amer: >60 mL/min (ref >60)
GFR calc non Af Amer: >60 mL/min (ref >60)
GFR calc non Af Amer: >60 mL/min (ref >60)
GLUCOSE: 245 mg/dL — AB (ref 65–99)
Glucose, Bld: 230 mg/dL — ABNORMAL HIGH (ref 65–99)
POTASSIUM: 4 mmol/L (ref 3.5–5.1)
Potassium: 4.1 mmol/L (ref 3.5–5.1)
SODIUM: 143 mmol/L (ref 135–145)
SODIUM: 144 mmol/L (ref 135–145)
Total Bilirubin: 1.5 mg/dL — ABNORMAL HIGH (ref 0.3–1.2)
Total Bilirubin: 1.4 mg/dL — ABNORMAL HIGH (ref 0.3–1.2)
Total Protein: 9.4 g/dL — ABNORMAL HIGH (ref 6.5–8.1)
Total Protein: 8.6 g/dL — ABNORMAL HIGH (ref 6.5–8.1)

## 2017-02-06 LAB — I-STAT BETA HCG BLOOD, ED (MC, WL, AP ONLY)
HCG, QUANTITATIVE: 18.6 m[IU]/mL — AB (ref <5)
I-stat hCG, quantitative: 5.8 m[IU]/mL — ABNORMAL HIGH (ref <5)

## 2017-02-06 LAB — CBC
HEMATOCRIT: 40.8 % (ref 36.0–46.0)
HEMATOCRIT: 41.2 % (ref 36.0–46.0)
HEMOGLOBIN: 13.6 g/dL (ref 12.0–15.0)
HEMOGLOBIN: 13.6 g/dL (ref 12.0–15.0)
MCH: 30.4 pg (ref 26.0–34.0)
MCH: 30.3 pg (ref 26.0–34.0)
MCHC: 33.3 g/dL (ref 30.0–36.0)
MCHC: 33 g/dL (ref 30.0–36.0)
MCV: 91.3 fL (ref 78.0–100.0)
MCV: 91.8 fL (ref 78.0–100.0)
Platelets: 349 10*3/uL (ref 150–400)
Platelets: 287 10*3/uL (ref 150–400)
RBC: 4.47 MIL/uL (ref 3.87–5.11)
RBC: 4.49 MIL/uL (ref 3.87–5.11)
RDW: 15.4 % (ref 11.5–15.5)
RDW: 15.3 % (ref 11.5–15.5)
WBC: 15.2 10*3/uL — ABNORMAL HIGH (ref 4.0–10.5)
WBC: 13.2 10*3/uL — ABNORMAL HIGH (ref 4.0–10.5)

## 2017-02-06 LAB — LIPASE, BLOOD
LIPASE: 21 U/L (ref 11–51)
Lipase: 20 U/L (ref 11–51)

## 2017-02-06 MED ORDER — ONDANSETRON 4 MG PO TBDP
4.0000 mg | ORAL_TABLET | Freq: Once | ORAL | Status: AC
  Administered 2017-02-06: 4 mg via ORAL
  Filled 2017-02-06: qty 1

## 2017-02-06 NOTE — ED Notes (Addendum)
Pt asked to speak with staff regarding wait time. Pt was instructed that patients are taken back based on acuity and wait time. Pt was in no acute distress.

## 2017-02-06 NOTE — ED Triage Notes (Signed)
Pt. Stated, [name] Atlasve been throwing up since Friday night. Im just throwing up white stuff

## 2017-02-06 NOTE — ED Triage Notes (Signed)
Pt reports she was seen in the ED for same yesterday d/t gastroparesis.  Not better.  She reports abd pain.

## 2017-02-06 NOTE — ED Notes (Signed)
Pt. Stated, to the phlebotomist, quiet asking me questions , you should know everything.

## 2017-02-07 ENCOUNTER — Emergency Department (HOSPITAL_COMMUNITY)
Admission: EM | Admit: 2017-02-07 | Discharge: 2017-02-07 | Discharge status: Against Medical Advice | Payer: Medicaid Other | Source: Home / Self Care

## 2017-02-08 ENCOUNTER — Encounter: Payer: Self-pay | Admitting: Family Medicine

## 2017-02-08 ENCOUNTER — Ambulatory Visit (INDEPENDENT_AMBULATORY_CARE_PROVIDER_SITE_OTHER): Payer: Self-pay | Admitting: Family Medicine

## 2017-02-08 ENCOUNTER — Other Ambulatory Visit: Payer: Self-pay

## 2017-02-08 ENCOUNTER — Inpatient Hospital Stay (HOSPITAL_COMMUNITY)
Admission: AD | Admit: 2017-02-08 | Discharge: 2017-02-10 | DRG: 074 | Disposition: A | Discharge status: Home / Self Care | Payer: Medicaid Other | Source: Ambulatory Visit | Attending: Family Medicine | Admitting: Family Medicine

## 2017-02-08 VITALS — BP 122/80 | HR 102 | Temp 97.5°F | Ht 68.0 in | Wt 141.2 lb

## 2017-02-08 DIAGNOSIS — E1021 Type 1 diabetes mellitus with diabetic nephropathy: Secondary | ICD-10-CM | POA: Diagnosis present

## 2017-02-08 DIAGNOSIS — E860 Dehydration: Secondary | ICD-10-CM | POA: Diagnosis present

## 2017-02-08 DIAGNOSIS — N179 Acute kidney failure, unspecified: Secondary | ICD-10-CM | POA: Diagnosis present

## 2017-02-08 DIAGNOSIS — Z794 Long term (current) use of insulin: Secondary | ICD-10-CM | POA: Diagnosis not present

## 2017-02-08 DIAGNOSIS — E785 Hyperlipidemia, unspecified: Secondary | ICD-10-CM

## 2017-02-08 DIAGNOSIS — F17200 Nicotine dependence, unspecified, uncomplicated: Secondary | ICD-10-CM

## 2017-02-08 DIAGNOSIS — K3184 Gastroparesis: Secondary | ICD-10-CM | POA: Diagnosis present

## 2017-02-08 DIAGNOSIS — Z79899 Other long term (current) drug therapy: Secondary | ICD-10-CM

## 2017-02-08 DIAGNOSIS — R000 Tachycardia, unspecified: Secondary | ICD-10-CM

## 2017-02-08 DIAGNOSIS — I10 Essential (primary) hypertension: Secondary | ICD-10-CM | POA: Diagnosis present

## 2017-02-08 DIAGNOSIS — E782 Mixed hyperlipidemia: Secondary | ICD-10-CM

## 2017-02-08 DIAGNOSIS — Z888 Allergy status to other drugs, medicaments and biological substances status: Secondary | ICD-10-CM

## 2017-02-08 DIAGNOSIS — R109 Unspecified abdominal pain: Secondary | ICD-10-CM

## 2017-02-08 DIAGNOSIS — F1210 Cannabis abuse, uncomplicated: Secondary | ICD-10-CM | POA: Diagnosis present

## 2017-02-08 DIAGNOSIS — R112 Nausea with vomiting, unspecified: Secondary | ICD-10-CM

## 2017-02-08 DIAGNOSIS — E1043 Type 1 diabetes mellitus with diabetic autonomic (poly)neuropathy: Principal | ICD-10-CM | POA: Diagnosis present

## 2017-02-08 DIAGNOSIS — F17210 Nicotine dependence, cigarettes, uncomplicated: Secondary | ICD-10-CM | POA: Diagnosis present

## 2017-02-08 DIAGNOSIS — E103599 Type 1 diabetes mellitus with proliferative diabetic retinopathy without macular edema, unspecified eye: Secondary | ICD-10-CM

## 2017-02-08 DIAGNOSIS — K219 Gastro-esophageal reflux disease without esophagitis: Secondary | ICD-10-CM | POA: Diagnosis present

## 2017-02-08 LAB — COMPREHENSIVE METABOLIC PANEL
ALK PHOS: 56 U/L (ref 38–126)
ALT: 29 U/L (ref 14–54)
ANION GAP: 16 — AB (ref 5–15)
AST: 39 U/L (ref 15–41)
Albumin: 4.1 g/dL (ref 3.5–5.0)
BUN: 20 mg/dL (ref 6–20)
CALCIUM: 9.3 mg/dL (ref 8.9–10.3)
CO2: 28 mmol/L (ref 22–32)
CREATININE: 1.19 mg/dL — AB (ref 0.44–1.00)
Chloride: 91 mmol/L — ABNORMAL LOW (ref 101–111)
GFR calc non Af Amer: 58 mL/min — ABNORMAL LOW (ref >60)
Glucose, Bld: 367 mg/dL — ABNORMAL HIGH (ref 65–99)
Potassium: 4.9 mmol/L (ref 3.5–5.1)
Sodium: 135 mmol/L (ref 135–145)
Total Bilirubin: 1.8 mg/dL — ABNORMAL HIGH (ref 0.3–1.2)
Total Protein: 7.8 g/dL (ref 6.5–8.1)

## 2017-02-08 LAB — HEMOGLOBIN A1C
HEMOGLOBIN A1C: 7.6 % — AB (ref 4.8–5.6)
MEAN PLASMA GLUCOSE: 171.42 mg/dL

## 2017-02-08 LAB — BASIC METABOLIC PANEL
Anion gap: 13 (ref 5–15)
BUN: 20 mg/dL (ref 6–20)
CHLORIDE: 92 mmol/L — AB (ref 101–111)
CO2: 29 mmol/L (ref 22–32)
Calcium: 9.3 mg/dL (ref 8.9–10.3)
Creatinine, Ser: 1.29 mg/dL — ABNORMAL HIGH (ref 0.44–1.00)
GFR calc Af Amer: >60 mL/min (ref >60)
GFR, EST NON AFRICAN AMERICAN: 53 mL/min — AB (ref >60)
Glucose, Bld: 291 mg/dL — ABNORMAL HIGH (ref 65–99)
POTASSIUM: 3.2 mmol/L — AB (ref 3.5–5.1)
SODIUM: 134 mmol/L — AB (ref 135–145)

## 2017-02-08 LAB — CBC
HCT: 47.5 % — ABNORMAL HIGH (ref 36.0–46.0)
HEMOGLOBIN: 15.5 g/dL — AB (ref 12.0–15.0)
MCH: 30 pg (ref 26.0–34.0)
MCHC: 32.6 g/dL (ref 30.0–36.0)
MCV: 91.9 fL (ref 78.0–100.0)
Platelets: 233 10*3/uL (ref 150–400)
RBC: 5.17 MIL/uL — AB (ref 3.87–5.11)
RDW: 14.6 % (ref 11.5–15.5)
WBC: 8.3 10*3/uL (ref 4.0–10.5)

## 2017-02-08 LAB — GLUCOSE, CAPILLARY: GLUCOSE-CAPILLARY: 290 mg/dL — AB (ref 65–99)

## 2017-02-08 LAB — SALICYLATE LEVEL: Salicylate Lvl: <7 mg/dL (ref 2.8–30.0)

## 2017-02-08 LAB — LIPASE, BLOOD: Lipase: 22 U/L (ref 11–51)

## 2017-02-08 LAB — HCG, QUANTITATIVE, PREGNANCY

## 2017-02-08 LAB — ETHANOL: Alcohol, Ethyl (B): <10 mg/dL (ref <10)

## 2017-02-08 MED ORDER — SODIUM CHLORIDE 0.9 % IV BOLUS (SEPSIS): 1000.0000 mL | Freq: Once | INTRAVENOUS | Status: DC

## 2017-02-08 MED ORDER — PANTOPRAZOLE SODIUM 40 MG IV SOLR
40.0000 mg | Freq: Two times a day (BID) | INTRAVENOUS | Status: DC
  Administered 2017-02-08 – 2017-02-10 (×4): 40 mg via INTRAVENOUS
  Filled 2017-02-08 (×4): qty 40

## 2017-02-08 MED ORDER — ACETAMINOPHEN 325 MG PO TABS
650.0000 mg | ORAL_TABLET | Freq: Four times a day (QID) | ORAL | Status: DC | PRN
  Administered 2017-02-08: 650 mg via ORAL
  Filled 2017-02-08: qty 2

## 2017-02-08 MED ORDER — SODIUM CHLORIDE 0.9 % IV SOLN
INTRAVENOUS | Status: DC
  Administered 2017-02-08: 21:00:00 via INTRAVENOUS
  Administered 2017-02-09: 1000 mL via INTRAVENOUS
  Administered 2017-02-09 (×2): via INTRAVENOUS

## 2017-02-08 MED ORDER — POLYETHYLENE GLYCOL 3350 17 G PO PACK
17.0000 g | PACK | Freq: Every day | ORAL | Status: DC | PRN
  Administered 2017-02-09: 17 g via ORAL
  Filled 2017-02-08: qty 1

## 2017-02-08 MED ORDER — METOCLOPRAMIDE HCL 5 MG/ML IJ SOLN
10.0000 mg | Freq: Four times a day (QID) | INTRAMUSCULAR | Status: DC
  Administered 2017-02-08 – 2017-02-10 (×7): 10 mg via INTRAVENOUS
  Filled 2017-02-08 (×7): qty 2

## 2017-02-08 MED ORDER — ENOXAPARIN SODIUM 40 MG/0.4ML ~~LOC~~ SOLN
40.0000 mg | SUBCUTANEOUS | Status: DC
  Administered 2017-02-08 – 2017-02-09 (×2): 40 mg via SUBCUTANEOUS
  Filled 2017-02-08 (×2): qty 0.4

## 2017-02-08 MED ORDER — INSULIN ASPART 100 UNIT/ML ~~LOC~~ SOLN
0.0000 [IU] | SUBCUTANEOUS | Status: DC
  Administered 2017-02-08: 5 [IU] via SUBCUTANEOUS
  Administered 2017-02-09: 7 [IU] via SUBCUTANEOUS
  Administered 2017-02-09: 5 [IU] via SUBCUTANEOUS
  Administered 2017-02-09: 2 [IU] via SUBCUTANEOUS
  Administered 2017-02-09: 5 [IU] via SUBCUTANEOUS
  Administered 2017-02-09 – 2017-02-10 (×3): 2 [IU] via SUBCUTANEOUS
  Administered 2017-02-10: 5 [IU] via SUBCUTANEOUS
  Administered 2017-02-10: 2 [IU] via SUBCUTANEOUS

## 2017-02-08 MED ORDER — GI COCKTAIL ~~LOC~~
30.0000 mL | Freq: Three times a day (TID) | ORAL | Status: DC | PRN
  Administered 2017-02-08: 30 mL via ORAL
  Filled 2017-02-08: qty 30

## 2017-02-08 MED ORDER — ACETAMINOPHEN 650 MG RE SUPP: 650.0000 mg | Freq: Four times a day (QID) | RECTAL | Status: DC | PRN

## 2017-02-08 MED ORDER — POTASSIUM CHLORIDE 10 MEQ/100ML IV SOLN
10.0000 meq | INTRAVENOUS | Status: AC
  Administered 2017-02-09 (×4): 10 meq via INTRAVENOUS
  Filled 2017-02-08 (×4): qty 100

## 2017-02-08 NOTE — Plan of Care (Signed)
Progressing

## 2017-02-08 NOTE — H&P (Signed)
[facility] History and Physical Service Pager: [phone]  Patient name: [name] Medical record number: [id number] Date of birth: 01/03/1981 Age: 37 y.o. Gender: female  Primary Care Provider: Garth Bignessimberlake, Kathryn, MD Consultants: none Code Status: full  Chief Complaint: vomiting  Assessment and Plan: Rachel Crosbyatrice R Romas is a 37 y.o. female presenting with intractable nausea and vomiting. PMH is significant for T1DM, gastroparesis, marijuana and tobacco abuse.  Intractable nausea and vomiting: Duration for one week, has not been able to keep food for fluids down. Diffusely tender on exam.  Patient with two recent ED visits for this complaint.  CT abdomen/pelvis on 02/04/17 suggestive of possible mild colitis. I-stat beta HCG 18.6 on 02/06/17, but urine pregnancy test negative on 02/04/17, so significance of I-stat result unknown.  Patient reports being on her period currently.  Lipase normal on 02/06/17.  CMP significant for chloride 91, glucose 367, creatinine 1.19, anion gap 16.  Bicarbonate is not low, but picture she could have a mixed metabolic acidosis due to DKA and alkalosis due to vomiting.  DDx includes gastroparesis exacerbation, viral gastroenteritis, DKA, pregnancy, marijuana use. - admit to med surg, attending Dr. Deirdre Priesthambliss - 1L NS bolus, then IV NS @ 100 ml/hr - urine pregnancy test, consider serum beta HCG - UDS, salicylate level, ethanol level  - UA  - CBC, CMP - UDS - lipase - consider KUB - IV protonix 40 mg BID - IV reglan 10 mg Q6H - GI cocktail - miralax daily PRN - consider repeat imaging if abdominal pain acutely worsens  Type 1 diabetes: Patient taking 70/30 BID.  Has not checked blood sugars during this period of illness because she felt too sick, but has taken her insulin at the usual dose.  Denies any symptoms of hypoglycemia.  Has a slightly elevated anion gap but is hypochloremic from vomiting as well as  dehydrated -will rehydrate with IVF - SSI Q4H, sensitive scale - Q4H CBGs - Hgb A1c - recheck BMP at 10 pm  AKI: Patient's creatinine is 1.19, baseline appears to be ~0.8-0.9.  Likely prerenal given patient's dehydration, although there could be an element of diabetic nephropathy since creatinine has been elevated various times in the past.   - IV hydration listed above - am BMP  Marijuana abuse: Patient says that she hasn't smoked marijuana since two weeks ago, and usually smokes 1-2 blunts per week.  Marijuana could exacerbate or cause her nausea as well. - UDS - consider capsaicin topically to stomach for cyclic vomiting syndrome   Tobacco abuse: Patient smokes less than one pack per day and does not think she will need a nicotine patch. -encourage cessation  FEN/GI: keep NPO, IVMF Prophylaxis: lovenox  Disposition: admit to inpatient for further management and work up   History of Present Illness:  Rachel Crosbyatrice R Foots is a 37 y.o. female presenting with intractable nausea and vomiting.  Patient has been throwing up since Friday. Denies drinking alcohol or doing drugs. Has felt hot and cold. Went to hospital several times.  Has not been measuring your sugars.  She was given zofran w/ no improvement in nausea. She has been unable to eat or drink anything except she did tolerate some green tea last night. She can tolerate ice chips. Throwing up bile.  She endorses some blood in her vomit. She endorses stomach chest and throat pain. Denies diarrhea. Denies urinary problems. Last BM was several days ago- has not been eating. This has happened before  but not as bad, saying "it's been a while."  Patient cannot identify any alleviating or exacerbating factors.    Review Of Systems: Per HPI with the following additions:   Review of Systems  Constitutional: Positive for chills, malaise/fatigue and weight loss. Negative for fever.  HENT: Positive for sore throat. Negative for congestion.    Eyes: Negative for blurred vision.  Respiratory: Negative for cough and shortness of breath.   Gastrointestinal: Positive for abdominal pain, constipation, nausea and vomiting. Negative for blood in stool, diarrhea and melena.  Genitourinary: Negative for dysuria.  Musculoskeletal: Positive for myalgias.  Skin: Negative for rash.  Neurological: Positive for weakness.    Patient Active Problem List   Diagnosis Date Noted  . Intractable nausea and vomiting 02/08/2017  . Vaginal discharge 11/09/2016  . Hair loss 11/09/2016  . Chronic left shoulder pain 08/14/2016  . Left elbow pain 07/10/2016  . DKA (diabetic ketoacidoses) (HCC) 06/27/2016  . DKA, type 1 (HCC) 06/26/2016  . Otomycosis of left ear 09/15/2015  . Mild nonproliferative diabetic retinopathy (HCC) 07/25/2013  . Intractable cyclical vomiting with nausea 07/24/2013  . Contraception 04/16/2013  . Unspecified constipation 08/28/2012  . Anemia 04/19/2012  . Essential hypertension, benign 04/19/2012  . Gastroparesis 03/11/2012  . TRIGGER FINGER, RIGHT THUMB 05/24/2009  . TOBACCO USER 11/07/2008  . Diabetes mellitus type 1, uncontrolled, insulin dependent (HCC) 04/02/2006  . HYPERCHOLESTEROLEMIA 03/29/2006    Past Medical History: Past Medical History:  Diagnosis Date  . Arthritis    "legs and hands" (08/21/2012)  . Chronic bronchitis (HCC)    "get it q yr" (08/21/2012)  . Exertional shortness of breath   . GERD (gastroesophageal reflux disease)   . ZOXWRUEA(540.9)    "weekly" (08/21/2012)  . Hyperlipidemia   . Hypertension   . Incarceration    Was in jail for selling drugs - out Oct 2010.  [name] Renal disorder   . Tobacco abuse   . Type I diabetes mellitus (HCC)    A1c > 11. Patient does not check BG or take insulin regularly.  Dx at age 69.    Past Surgical History: Past Surgical History:  Procedure Laterality Date  . TUBAL LIGATION  2009    Social History: Social History   Tobacco Use  . Smoking status:  Current Every Day Smoker    Packs/day: 0.33    Years: 14.00    Pack years: 4.62    Types: Cigarettes  . Smokeless tobacco: Never Used  Substance Use Topics  . Alcohol use: Yes    Alcohol/week: 0.0 oz    Comment: 08/21/2012 "might drink 1-2 glasses of liquor once/month"  . Drug use: Yes    Types: Marijuana   Additional social history: mom and her kids, smokes cigarettes,  Hx alcohol abuse last drink 1 month ago, smokes marijuana "joint or two a week", no other drug use Please also refer to relevant sections of EMR.  Family History: No family history on file. DM runs in the family  Allergies and Medications: Allergies  Allergen Reactions  . Levonorgestrel-Ethinyl Estrad Cough    Sneezing, headaches.   No current facility-administered medications on file prior to encounter.    Current Outpatient Medications on File Prior to Encounter  Medication Sig Dispense Refill  . Blood Glucose Monitoring Suppl (ACCU-CHEK AVIVA) device 1 each by Other route 4 (four) times daily - after meals and at bedtime. Use as instructed 1 each 0  . cetirizine (ZYRTEC) 10 MG tablet Take 1 tablet (10  mg total) by mouth daily as needed for allergies. (Patient not taking: Reported on 06/26/2016) 30 tablet 1  . clindamycin-benzoyl peroxide (BENZACLIN) gel Apply topically 2 (two) times daily. (Patient not taking: Reported on 02/04/2017) 25 g 1  . fluconazole (DIFLUCAN) 150 MG tablet Take 1 tablet. If you still are having symptoms, repeat dose in 72 hours. (Patient not taking: Reported on 02/04/2017) 2 tablet 0  . fluticasone (FLONASE) 50 MCG/ACT nasal spray Place 2 sprays into both nostrils daily. (Patient not taking: Reported on 06/26/2016) 16 g 0  . glucose blood (ACCU-CHEK AVIVA) test strip 1 each by Other route 4 (four) times daily - after meals and at bedtime. Use as instructed 100 each 12  . insulin NPH-regular Human (NOVOLIN 70/30) (70-30) 100 UNIT/ML injection Inject 23 Units into the skin 2 (two) times daily  with a meal. 23 Units in the am and 15-20 units in pm. 10 mL 11  . Lancet Devices (ACCU-CHEK SOFTCLIX) lancets 1 each by Other route 4 (four) times daily - after meals and at bedtime. Use as instructed 1 each 11  . lisinopril (PRINIVIL,ZESTRIL) 10 MG tablet Take 1 tablet (10 mg total) by mouth daily. (Patient not taking: Reported on 01/25/2015) 90 tablet 3  . metoCLOPramide (REGLAN) 10 MG tablet Take 1 tablet (10 mg total) by mouth 3 (three) times daily before meals. (Patient not taking: Reported on 06/26/2016) 90 tablet 5  . metroNIDAZOLE (FLAGYL) 500 MG tablet Take 1 tablet (500 mg total) by mouth 2 (two) times daily. 14 tablet 0  . omeprazole (PRILOSEC) 20 MG capsule Take 1 capsule (20 mg total) by mouth daily. 30 capsule 0  . omeprazole (PRILOSEC) 20 MG capsule Take 1 capsule (20 mg total) by mouth daily. 30 capsule 0  . ondansetron (ZOFRAN) 4 MG tablet Take 1 tablet (4 mg total) by mouth every 6 (six) hours. 12 tablet 0  . valACYclovir (VALTREX) 1000 MG tablet Take 1 tablet (1,000 mg total) by mouth 2 (two) times daily. 20 tablet 0    Objective: LMP 01/13/2017  Physical Exam  Constitutional: She is oriented to person, place, and time. She appears well-developed and well-nourished. She appears distressed.  Speaking in quiet voice, looks very uncomfortable  HENT:  Head: Normocephalic and atraumatic.  Right Ear: External ear normal.  Left Ear: External ear normal.  Nose: Nose normal.  Eyes: EOM are normal.  Neck: Normal range of motion. Neck supple.  Cardiovascular: Normal rate, regular rhythm and normal heart sounds.  Pulmonary/Chest: Effort normal and breath sounds normal. No respiratory distress. She has no wheezes.  Abdominal: Soft. She exhibits no distension. There is tenderness.  Hypoactive bowel sounds; diffusely tender  Musculoskeletal: Normal range of motion. She exhibits no edema or deformity.  Neurological: She is alert and oriented to person, place, and time.  Skin: Skin is  dry. She is not diaphoretic.  Cool skin  Psychiatric: She has a normal mood and affect.    Labs and Imaging: CBC BMET  Recent Labs  Lab 02/06/17 2027  WBC 13.2*  HGB 13.6  HCT 41.2  PLT 287   Recent Labs  Lab 02/06/17 2027  NA 144  K 4.0  CL 103  CO2 28  BUN 21*  CREATININE 1.07*  GLUCOSE 245*  CALCIUM 9.8       Lennox Solders, MD 02/08/2017, 9:14 PM PGY-1, [facility] Family Medicine FPTS Intern pager: [phone], text pages welcome   FPTS Upper-Level Resident Addendum  I have independently interviewed  and examined the patient. I have discussed the above with the original author and agree with their documentation. My edits for correction/addition/clarification are in pink.Please see also any attending notes.   Dolores Patty, DO PGY-2,  Family Medicine FPTS Service pager: [phone] (text pages welcome through [facility])

## 2017-02-08 NOTE — Progress Notes (Signed)
   CC: vomiting  HPI Started vomiting 1/4. [facility] and given zofran. No one else sick. Sugars around 200s. Occasional blood tinged vomit. Patient has been back to ED twice since 1/6, but left due to wait times prior to being assessed. Zofran without significant improvement. No diarrhea, one BM since onset of illness. Hx of gastroparesis, out of reglan for quite some time.   Of note, occasional marijauana use (~2 times per week). None over the last 2 weeks. No other drugs.   ROS: denies CP, SOB, endorses abdominal pain, denies dysuria.    CC, SH/smoking status, and VS noted  Objective: BP 122/80   Pulse (!) 102   Temp (!) 97.5 F (36.4 C) (Oral)   Ht 5\' 8"  (1.727 m)   Wt 141 lb 3.2 oz (64 kg)   LMP 01/13/2017   SpO2 99%   BMI 21.47 kg/m  Gen: ill appearing, minimally talkative, lying on exam table HEENT: NCAT, EOMI CV: RRR, no murmur Resp: CTAB, no wheezes, non-labored Abd: soft, diffusely tender, BS present, no guarding or organomegaly Ext: No edema, warm. Cap refill 3 secs.  Neuro: Alert and oriented, Speech clear, No gross deficits  Assessment and plan:  Intractable vomiting: due to tachycardia, inability to keep PO down, and sluggish cap refill, sent for direct admission. Low concern for surgical abdominal process. Possible etiologies include THC cyclical vomiting syndrome, gastroparesis, DKA (less likely given recent normal sugars and lack of respiratory symptoms), or GI illness. Would check electrolytes and give copious IV hydration. Low concern for SBO as patient with bowel sounds and one normal BM. May defer imaging as recently had CT. Additionally, 20 lb weight loss since last seen this fall. Will need to monitor return of weight after resolution of acute illness.   Loni MuseKate Dennise Raabe, MD, PGY2 02/12/2017 9:47 AM

## 2017-02-08 NOTE — Progress Notes (Signed)
Pt arrived to the unit without any notice. Nurse was informed by charge nurse she was a direct admit and a patient of family medicine. Nurse tried reaching Dr. Deirdre Priesthambliss to see pt and put in some orders for pt, but no avail. Will call attending MD again.

## 2017-02-08 NOTE — Patient Instructions (Signed)
Sent for direct admission.

## 2017-02-09 DIAGNOSIS — E103599 Type 1 diabetes mellitus with proliferative diabetic retinopathy without macular edema, unspecified eye: Secondary | ICD-10-CM

## 2017-02-09 LAB — URINALYSIS, ROUTINE W REFLEX MICROSCOPIC

## 2017-02-09 LAB — RAPID URINE DRUG SCREEN, HOSP PERFORMED
Amphetamines: NOT DETECTED
Barbiturates: POSITIVE — AB
Benzodiazepines: NOT DETECTED
Cocaine: NOT DETECTED
Opiates: NOT DETECTED
Tetrahydrocannabinol: POSITIVE — AB

## 2017-02-09 LAB — PREGNANCY, URINE: Preg Test, Ur: NEGATIVE

## 2017-02-09 LAB — GLUCOSE, CAPILLARY
GLUCOSE-CAPILLARY: 165 mg/dL — AB (ref 65–99)
GLUCOSE-CAPILLARY: 291 mg/dL — AB (ref 65–99)
GLUCOSE-CAPILLARY: 288 mg/dL — AB (ref 65–99)
Glucose-Capillary: 183 mg/dL — ABNORMAL HIGH (ref 65–99)
Glucose-Capillary: 194 mg/dL — ABNORMAL HIGH (ref 65–99)

## 2017-02-09 LAB — BASIC METABOLIC PANEL
Anion gap: 11 (ref 5–15)
BUN: 18 mg/dL (ref 6–20)
CO2: 29 mmol/L (ref 22–32)
CREATININE: 1.1 mg/dL — AB (ref 0.44–1.00)
Calcium: 8.9 mg/dL (ref 8.9–10.3)
Chloride: 96 mmol/L — ABNORMAL LOW (ref 101–111)
GFR calc Af Amer: >60 mL/min (ref >60)
GLUCOSE: 206 mg/dL — AB (ref 65–99)
POTASSIUM: 3.7 mmol/L (ref 3.5–5.1)
Sodium: 136 mmol/L (ref 135–145)

## 2017-02-09 LAB — CBC
HEMATOCRIT: 41.4 % (ref 36.0–46.0)
Hemoglobin: 13.3 g/dL (ref 12.0–15.0)
MCH: 29.4 pg (ref 26.0–34.0)
MCHC: 32.1 g/dL (ref 30.0–36.0)
MCV: 91.4 fL (ref 78.0–100.0)
Platelets: 249 10*3/uL (ref 150–400)
RBC: 4.53 MIL/uL (ref 3.87–5.11)
RDW: 14.5 % (ref 11.5–15.5)
WBC: 8 10*3/uL (ref 4.0–10.5)

## 2017-02-09 MED ORDER — INSULIN GLARGINE 100 UNIT/ML ~~LOC~~ SOLN
5.0000 [IU] | Freq: Every day | SUBCUTANEOUS | Status: DC
Start: 2017-02-09 — End: 2017-02-10
  Administered 2017-02-09 – 2017-02-10 (×2): 5 [IU] via SUBCUTANEOUS
  Filled 2017-02-09 (×2): qty 0.05

## 2017-02-09 MED ORDER — BLISTEX MEDICATED EX OINT
TOPICAL_OINTMENT | CUTANEOUS | Status: DC | PRN
  Administered 2017-02-09: 1 via TOPICAL
  Filled 2017-02-09: qty 6.3

## 2017-02-09 MED ORDER — LIP MEDEX EX OINT: TOPICAL_OINTMENT | CUTANEOUS | Status: DC | PRN

## 2017-02-09 NOTE — Progress Notes (Signed)
Family Medicine Teaching Service Daily Progress Note Intern Pager: [phone]  Patient name: [name] Medical record number: [id number] Date of birth: 25-Aug-1980 Age: 37 y.o. Gender: female  Primary Care Provider: Garth Bigness, MD Consultants: none Code Status: full  Pt Overview and Major Events to Date:  1/11 admitted to FPTS  Assessment and Plan: [name] is a 37 y.o. female presenting with intractable nausea and vomiting. PMH is significant for T1DM, gastroparesis, marijuana and tobacco abuse.  Intractable nausea and vomiting Abdominal pain Improved greatly. Keeping fluids down and has not vomited since admission. Asking to advance diet to hearty soups. At this point DDX appears to be cannabinoid induced vomiting vs gastroparesis. Given that she improved greatly on the reglan appears to be more gastroparesis related especially in setting of decreased bowel movements. Abdominal tenderness resolving. Likely musculoskeletal in etiology. Patient is very interested in quitting marijuana and I agree this will be a great move. Will take it slow as want to reduce possibility for readmission. Hopefully dc 1/12 or 1/13. - vital signs per floor routine - advance diet as tolerated - Continue reglan IV 10mg  Q6 hours - GI cocktail and protonix as needed for indigestion  Type 1 diabetes: Patient taking 70/30 BID.  Has not checked blood sugars during this period of illness because she felt too sick, but has taken her insulin at the usual dose. Will ask for case management to assist with medication cost. On sSSI. Will start lantus 5 and go up slowly as she reintroduces food. - continue NS at 157mL/hr, likely stop wean once tolerating PO - SSI Q4H, sensitive scale - Lantus 5U - Q4H CBGs - Hgb A1c - bmp daily  AKI: Patient's creatinine is 1.10 from 1.29, baseline appears to be ~0.8-0.9.  Likely prerenal given patient's dehydration, although there could be an element of diabetic  nephropathy since creatinine has been elevated various times in the past.   - IV hydration listed above - am BMP  Marijuana abuse: Patient says that she hasn't smoked marijuana since two weeks ago, and usually smokes 1-2 blunts per week.  Marijuana could exacerbate or cause her nausea as well. - patient is now interested in quitting, applauded this move  Tobacco abuse: Patient smokes less than one pack per day and does not think she will need a nicotine patch. -encourage cessation  FEN/GI: regular diet PPx: lovenox  Disposition: home  Subjective:  Doing better this morning. No acute distress. No vomiting since admission. Keeping down some liquids. Wants to try heavier soups.  Objective: Temp:  [97.5 F (36.4 C)-98.5 F (36.9 C)] 98.4 F (36.9 C) (01/11 0427) Pulse Rate:  [94-102] 94 (01/11 0427) Resp:  [18] 18 (01/11 0427) BP: (117-134)/(70-88) 117/70 (01/11 0427) SpO2:  [99 %] 99 % (01/11 0427) Weight:  [141 lb 3.2 oz (64 kg)] 141 lb 3.2 oz (64 kg) (01/10 1628) Physical Exam: General: no acute distress, alert, oriented, resting comfortably, sitting up in bed Cardiovascular: rrr, palpable peripheral pulses Respiratory: lungs clear to ausculation bilaterally, no acute distress Abdomen: some tenderness in mid-epigastric area, external in musculoskeletal distribtion Extremities: 5/5 strength all muscle groups BUE/BLE Neuro: CN 2-12 intact, no focal neuro deficits  Laboratory: Recent Labs  Lab 02/06/17 2027 02/08/17 1843 02/09/17 0248  WBC 13.2* 8.3 8.0  HGB 13.6 15.5* 13.3  HCT 41.2 47.5* 41.4  PLT 287 233 249   Recent Labs  Lab 02/06/17 1642 02/06/17 2027 02/08/17 1843 02/08/17 2157 02/09/17 0248  NA 143 144  135 134* 136  K 4.1 4.0 4.9 3.2* 3.7  CL 101 103 91* 92* 96*  CO2 28 28 28 29 29   BUN 17 21* 20 20 18   CREATININE 1.02* 1.07* 1.19* 1.29* 1.10*  CALCIUM 9.9 9.8 9.3 9.3 8.9  PROT 9.4* 8.6* 7.8  --   --   BILITOT 1.5* 1.4* 1.8*  --   --   ALKPHOS 66  59 56  --   --   ALT 20 17 29   --   --   AST 28 22 39  --   --   GLUCOSE 230* 245* 367* 291* 206*    Imaging/Diagnostic Tests: CLINICAL DATA:  GI bleed.  Vomiting for 3 days.  EXAM: CT ABDOMEN AND PELVIS WITH CONTRAST  TECHNIQUE: Multidetector CT imaging of the abdomen and pelvis was performed using the standard protocol following bolus administration of intravenous contrast.  CONTRAST:  100 cc ISOVUE-300 IOPAMIDOL (ISOVUE-300) INJECTION 61%  COMPARISON:  CT 01/25/2015  FINDINGS: Lower chest: The lung bases are clear. There is mild distal esophageal wall thickening.  Hepatobiliary: No focal liver abnormality is seen. No gallstones, gallbladder wall thickening, or biliary dilatation.  Pancreas: No ductal dilatation or inflammation.  Spleen: Normal in size without focal abnormality.  Adrenals/Urinary Tract: Adrenal glands are unremarkable. Kidneys are normal, without renal calculi, focal lesion, or hydronephrosis. Bladder is unremarkable.  Stomach/Bowel: Lack of enteric contrast and paucity of intra-abdominal fat limits assessment. Distal esophageal wall thickening. The stomach is nondistended. No small bowel dilatation or inflammation. Appendix tentatively identified and normal. Majority of the colon is nondistended, possible mild diffuse colonic wall thickening versus nondistention.  Vascular/Lymphatic: No significant vascular findings are present. No enlarged abdominal or pelvic lymph nodes.  Reproductive: Uterus is unremarkable. Probable corpus luteal cyst in the left ovary. No adnexal mass. Left tubal ligation clips. Small amount of free fluid in the pelvis, likely physiologic.  Other: Small amount of pelvic free fluid. No upper abdominal ascites. No free air or evidence bowel perforation. No intra-abdominal abscess.  Musculoskeletal: There are no acute or suspicious osseous abnormalities.  IMPRESSION: 1. Distal esophageal wall thickening  suggesting reflux or esophagitis. 2. Possible diffuse colonic wall thickening in this patient with GI bleed versus nondistention. Mild colitis is considered.  Myrene Buddy, MD 02/09/2017, 11:49 AM PGY-1, [facility] FPTS Intern pager: [phone], text pages welcome

## 2017-02-09 NOTE — Progress Notes (Signed)
Inpatient Diabetes Program Recommendations  AACE/ADA: New Consensus Statement on Inpatient Glycemic Control (2015)  Target Ranges:  Prepandial:   less than 140 mg/dL      Peak postprandial:   less than 180 mg/dL (1-2 hours)      Critically ill patients:  140 - 180 mg/dL   Lab Results  Component Value Date   GLUCAP 194 (H) 02/09/2017   HGBA1C 7.6 (H) 02/08/2017    Review of Glycemic Control Results for [name], [name] (MRN [id number]) as of 02/09/2017 09:26  Ref. Range 02/08/2017 20:37 02/09/2017 00:36 02/09/2017 04:25 02/09/2017 07:59  Glucose-Capillary Latest Ref Range: 65 - 99 mg/dL 782290 (H) 956183 (H) 213165 (H) 194 (H)   Diabetes history: DM1 Outpatient Diabetes medications: 70/30 Novolin insulin mix 24-25 units am + 12-15 units pm Current orders for Inpatient glycemic control: Novolog sensitive correction q 4 hrs  Inpatient Diabetes Program Recommendations:   -Lantus 20 units daily (80% basal) -Novolog 2 units tid when eating 50% meals -Adjust correction to tid with meals and check 2 am CBG  Patient saw endocrinologist Dr. Randie Heinzain in the past and last phone contact was 05/13/15. Spoke with patient and patient states she has no insurance and unable to afford to go to appts currently. [facility] and has adjusted since seen by Dr. Randie Heinzain due to having hypoglycemia. Case manager consult to check on possibility of Medicaid. Patient has reapplied.  Thank you, Billy FischerJudy E. Gloyd Happ, RN, MSN, CDE  Diabetes Coordinator Inpatient Glycemic Control Team Team Pager [phone] (8am-5pm) 02/09/2017 9:44 AM

## 2017-02-10 DIAGNOSIS — E782 Mixed hyperlipidemia: Secondary | ICD-10-CM

## 2017-02-10 DIAGNOSIS — R109 Unspecified abdominal pain: Secondary | ICD-10-CM

## 2017-02-10 DIAGNOSIS — R112 Nausea with vomiting, unspecified: Secondary | ICD-10-CM

## 2017-02-10 DIAGNOSIS — F1210 Cannabis abuse, uncomplicated: Secondary | ICD-10-CM

## 2017-02-10 LAB — BASIC METABOLIC PANEL
Anion gap: 8 (ref 5–15)
BUN: 6 mg/dL (ref 6–20)
CO2: 27 mmol/L (ref 22–32)
Calcium: 8.3 mg/dL — ABNORMAL LOW (ref 8.9–10.3)
Chloride: 99 mmol/L — ABNORMAL LOW (ref 101–111)
Creatinine, Ser: 0.93 mg/dL (ref 0.44–1.00)
GFR calc Af Amer: >60 mL/min (ref >60)
Glucose, Bld: 214 mg/dL — ABNORMAL HIGH (ref 65–99)
POTASSIUM: 3.4 mmol/L — AB (ref 3.5–5.1)
SODIUM: 134 mmol/L — AB (ref 135–145)

## 2017-02-10 MED ORDER — METOCLOPRAMIDE HCL 10 MG PO TABS: 10.0000 mg | ORAL_TABLET | Freq: Four times a day (QID) | ORAL | 0 refills | Status: DC | PRN

## 2017-02-10 MED ORDER — POLYETHYLENE GLYCOL 3350 17 G PO PACK: 17.0000 g | PACK | Freq: Every day | ORAL | 0 refills | Status: DC | PRN

## 2017-02-10 MED ORDER — PANTOPRAZOLE SODIUM 40 MG PO TBEC: 40.0000 mg | DELAYED_RELEASE_TABLET | Freq: Every day | ORAL | 0 refills | Status: DC

## 2017-02-10 NOTE — Care Management Note (Signed)
Case Management Note  Patient Details  Name: [name] MRN: [id number] Date of Birth: 09/11/1980  Subjective/Objective:      Pt presented for intractable N/V due to gastroparesis.  [facility]  Pt states has [facility] but medications are still too expensive and unable to afford insulin at times.  Pt given 3 new prescriptions with pantoprazole $21, Miralax $27, and metoclopramide $4 at [facility].  Pt states she will not be able to purchase these prescriptions.     Pt started Medicaid application with financial counselor, but no information available on status of application.       Action/Plan: MATCH letter given and explained.  Pt advised about changing PCP to local clinics and using [facility].  Since clinics are not open today to make appointments, pt given information to follow up if she chooses to.  Pt also given information to call Financial Counseling to follow up on Medicaid application.    Expected Discharge Date:  02/10/17               Expected Discharge Plan:  Home/Self Care  In-House Referral:  Financial Counselor  Discharge planning Services  CM Consult  Post Acute Care Choice:  NA Choice offered to:  NA  DME Arranged:  N/A DME Agency:  NA  HH Arranged:  NA HH Agency:  NA  Status of Service:  Completed, signed off  If discussed at Long Length of Stay Meetings, dates discussed:    Additional Comments:  Verdene LennertGoldean, Jadan Rouillard K, RN 02/10/2017, 12:51 PM

## 2017-02-10 NOTE — Discharge Summary (Signed)
Family Medicine Teaching [facility] Discharge Summary  Patient name: [name] Medical record number: [id number] Date of birth: [birth year] Age: 37 y.o. Gender: female Date of Admission: 02/08/2017  Date of Discharge: 02/10/2017 Admitting Physician: Moses Manners, MD  Primary Care Provider: Garth Bigness, MD Consultants: none  Indication for Hospitalization: intractable nausea and vomiting  Discharge Diagnoses/Problem List:  Intractable n/v Abdominal pain Type I diabetes AKI Marijuana abuse Tobacco abuse  Disposition: home  Discharge Condition: stable  Discharge Exam: General: female resting comfortably in bed, NAD with non-toxic appearance Neck: supple, non-tender without lymphadenopathy Cardiovascular: regular rate and rhythm without murmurs, rubs, or gallops Lungs: clear to auscultation bilaterally with normal work of breathing Abdomen: soft, non-tender, non-distended, normoactive bowel sounds Skin: warm, dry, no rashes or lesions, cap refill < 2 seconds Extremities: warm and well perfused, normal tone, no edema  Brief Hospital Course:  37 year old who presented on 02/08/2017 with a 2 week history of intractable n/v. Had a reported 20lbs weight loss over this time period. She was given iv fluid hydration and IV reglan. The reglan greatly helped her symptoms and by the morning of 1/11 she was tolerating Clear liquids. She was advanced to regular diet in the afternoon of 1/11. Although patient had THC in UDS it was felt that her symptoms were due to a gastroparesis exacerbation. Patient did endorse desire to stop smoking marijuana and cigarettes and was interested in resources to help quit. Patient continued to do well in the morning and afternoon of 1/12/ She was discharged with a prescription for prn reglan. She has a follow up scheduled on 1/17 at [facility].   Issues for Follow Up:  1. Lisinopril held at discharge due to AKI. Would recommend repeat Cr. Patient  reportedly not taking prior to admission. Discuss with patient. Would benefit from Ace/Arb for renal protection in setting of DM. 2. F/U toleration of food, and n/v status 3. Discharged with Reglan prn for diabetic gastroparesis exacerbation, follow up toleration. Monitor for EPS side-effects of chronic reglan use 4. Patient interested in smoking cessation  Significant Procedures:  Significant Labs and Imaging:  Recent Labs  Lab 02/06/17 2027 02/08/17 1843 02/09/17 0248  WBC 13.2* 8.3 8.0  HGB 13.6 15.5* 13.3  HCT 41.2 47.5* 41.4  PLT 287 233 249   Recent Labs  Lab 02/06/17 1642 02/06/17 2027 02/08/17 1843 02/08/17 2157 02/09/17 0248 02/10/17 0540  NA 143 144 135 134* 136 134*  K 4.1 4.0 4.9 3.2* 3.7 3.4*  CL 101 103 91* 92* 96* 99*  CO2 28 28 28 29 29 27   GLUCOSE 230* 245* 367* 291* 206* 214*  BUN 17 21* 20 20 18 6   CREATININE 1.02* 1.07* 1.19* 1.29* 1.10* 0.93  CALCIUM 9.9 9.8 9.3 9.3 8.9 8.3*  ALKPHOS 66 59 56  --   --   --   AST 28 22 39  --   --   --   ALT 20 17 29   --   --   --   ALBUMIN 4.6 4.6 4.1  --   --   --    Results/Tests Pending at Time of Discharge:   Discharge Medications:  Allergies as of 02/10/2017      Reactions   Levonorgestrel-ethinyl Estrad Cough   Sneezing, headaches.      Medication List    STOP taking these medications   cetirizine 10 MG tablet Commonly known as:  ZYRTEC   clindamycin-benzoyl peroxide gel Commonly known as:  BENZACLIN   fluconazole 150 MG tablet Commonly known as:  DIFLUCAN   fluticasone 50 MCG/ACT nasal spray Commonly known as:  FLONASE   lisinopril 10 MG tablet Commonly known as:  PRINIVIL,ZESTRIL   omeprazole 20 MG capsule Commonly known as:  PRILOSEC   valACYclovir 1000 MG tablet Commonly known as:  VALTREX     TAKE these medications   insulin NPH-regular Human (70-30) 100 UNIT/ML injection Commonly known as:  NOVOLIN 70/30 Inject 23 Units into the skin 2 (two) times daily with a meal. 23 Units  in the am and 15-20 units in pm. What changed:  additional instructions   metoCLOPramide 10 MG tablet Commonly known as:  REGLAN Take 1 tablet (10 mg total) by mouth every 6 (six) hours as needed for nausea. What changed:    when to take this  reasons to take this   metroNIDAZOLE 500 MG tablet Commonly known as:  FLAGYL Take 1 tablet (500 mg total) by mouth 2 (two) times daily.   ondansetron 4 MG tablet Commonly known as:  ZOFRAN Take 1 tablet (4 mg total) by mouth every 6 (six) hours.   pantoprazole 40 MG tablet Commonly known as:  PROTONIX Take 1 tablet (40 mg total) by mouth daily.   polyethylene glycol packet Commonly known as:  MIRALAX / GLYCOLAX Take 17 g by mouth daily as needed for mild constipation.       Discharge Instructions: Please refer to Patient Instructions section of EMR for full details.  Patient was counseled important signs and symptoms that should prompt return to medical care, changes in medications, dietary instructions, activity restrictions, and follow up appointments.   Follow-Up Appointments: Follow-up Information    [facility]. Go on 02/15/2017.   Specialty:  Family Medicine Why:  At 3:45 for hospital follow up with Dr. Jamse Belfast information: [address] [id number] mc [place] [address] [phone]       [facility] Follow up.   Why:  Please call to try to make a new patient appointment.  When you are established as a patient here, you may use the pharmacy located at this office for medications $4-$10 each. They have open clinic hours on Thursday Mornings starting at 8am.  Contact information: [address] [place] [address] [phone]       [facility] Follow up.   Why:  Please call to make an appointment here to be able to use the [facility] pharmacy.  Contact  information: [address] [place] [address] [id number] mc [place] [address] [phone]       [facility]. Call.   Why:  You are eligible for primary care at this clinic because your zipcode is [phone].  You may use [facility] pharmacy if you establish care here.  Contact information: [address] [place] [address] [phone]          Myrene Buddy, MD 02/12/2017, 2:47 AM PGY-1, [facility]

## 2017-02-10 NOTE — Progress Notes (Signed)
Providence CrosbyPatrice R Germond to be D/C'd Home per MD order.  Discussed with the patient and all questions fully answered.  VSS, Skin clean, dry and intact without evidence of skin break down, no evidence of skin tears noted. IV catheter discontinued intact. Site without signs and symptoms of complications. Dressing and pressure applied.  An After Visit Summary was printed and given to the patient.   D/c education completed with patient/family including follow up instructions, medication list, d/c activities limitations if indicated, with other d/c instructions as indicated by MD - patient able to verbalize understanding, all questions fully answered.   Patient instructed to return to ED, call 911, or call MD for any changes in condition.   Patient is awaiting ride.  Casper HarrisonSamantha K Jameison Haji 02/10/2017 1:39 PM

## 2017-02-10 NOTE — Progress Notes (Signed)
Family Medicine Teaching Service Daily Progress Note Intern Pager: [phone]  Patient name: [name] Medical record number: [id number] Date of birth: 08/28/1980 Age: 37 y.o. Gender: female  Primary Care Provider: Garth Bignessimberlake, Kathryn, MD Consultants: none Code Status: full  Pt Overview and Major Events to Date:  1/11 admitted to FPTS  Assessment and Plan: [name] is a 37 y.o. female presenting with intractable nausea and vomiting. PMH is significant for T1DM, gastroparesis, marijuana and tobacco abuse.  Intractable nausea and vomiting Abdominal pain Resolved. Thought to be gastroparesis exacerbation. Tolerating full diet as of today. Remains asymptomatic. Taking Reglan which seems to control symptoms well. - vital signs per floor routine - regualr diet - Continue reglan IV 10mg  Q6 hours - GI cocktail and protonix as needed for indigestion  Type 1 diabetes: Patient taking 70/30 BID.  Has not checked blood sugars during this period of illness because she felt too sick, but has taken her insulin at the usual dose. Will ask for case management to assist with medication cost. On sSSI. Will start lantus 5 and go up slowly as she reintroduces food. - continue NS at 1400mL/hr, likely stop wean once tolerating PO - SSI Q4H, sensitive scale - Lantus 5U - Q4H CBGs - Hgb A1c - bmp daily  AKI: Patient's creatinine is 1.10 from 1.29, baseline appears to be ~0.8-0.9.  Likely prerenal given patient's dehydration, although there could be an element of diabetic nephropathy since creatinine has been elevated various times in the past.   - IV hydration listed above - am BMP  Marijuana abuse: Patient says that she hasn't smoked marijuana since two weeks ago, and usually smokes 1-2 blunts per week.  Marijuana could exacerbate or cause her nausea as well. - patient is now interested in quitting, applauded this move  Tobacco abuse: Patient smokes less than one pack per day and does  not think she will need a nicotine patch. -encourage cessation  FEN/GI: regular diet PPx: lovenox  Disposition: home today  Subjective:  Doing well this morning. Tolerating full diet. Without nausea or abdominal pain.  Objective: Temp:  [98 F (36.7 C)-98.7 F (37.1 C)] 98.3 F (36.8 C) (01/12 0501) Pulse Rate:  [81-87] 81 (01/12 0501) Resp:  [16-18] 18 (01/12 0501) BP: (111-131)/(67-74) 111/68 (01/12 0501) SpO2:  [100 %] 100 % (01/12 0501) Physical Exam: General: female resting comfortably in bed, NAD with non-toxic appearance Neck: supple, non-tender without lymphadenopathy Cardiovascular: regular rate and rhythm without murmurs, rubs, or gallops Lungs: clear to auscultation bilaterally with normal work of breathing Abdomen: soft, non-tender, non-distended, normoactive bowel sounds Skin: warm, dry, no rashes or lesions, cap refill < 2 seconds Extremities: warm and well perfused, normal tone, no edema  Laboratory: Recent Labs  Lab 02/06/17 2027 02/08/17 1843 02/09/17 0248  WBC 13.2* 8.3 8.0  HGB 13.6 15.5* 13.3  HCT 41.2 47.5* 41.4  PLT 287 233 249   Recent Labs  Lab 02/06/17 1642 02/06/17 2027 02/08/17 1843 02/08/17 2157 02/09/17 0248 02/10/17 0540  NA 143 144 135 134* 136 134*  K 4.1 4.0 4.9 3.2* 3.7 3.4*  CL 101 103 91* 92* 96* 99*  CO2 28 28 28 29 29 27   BUN 17 21* 20 20 18 6   CREATININE 1.02* 1.07* 1.19* 1.29* 1.10* 0.93  CALCIUM 9.9 9.8 9.3 9.3 8.9 8.3*  PROT 9.4* 8.6* 7.8  --   --   --   BILITOT 1.5* 1.4* 1.8*  --   --   --  ALKPHOS 66 59 56  --   --   --   ALT 20 17 29   --   --   --   AST 28 22 39  --   --   --   GLUCOSE 230* 245* 367* 291* 206* 214*    Imaging/Diagnostic Tests: CLINICAL DATA:  GI bleed.  Vomiting for 3 days.  EXAM: CT ABDOMEN AND PELVIS WITH CONTRAST  TECHNIQUE: Multidetector CT imaging of the abdomen and pelvis was performed using the standard protocol following bolus administration of intravenous  contrast.  CONTRAST:  100 cc ISOVUE-300 IOPAMIDOL (ISOVUE-300) INJECTION 61%  COMPARISON:  CT 01/25/2015  FINDINGS: Lower chest: The lung bases are clear. There is mild distal esophageal wall thickening.  Hepatobiliary: No focal liver abnormality is seen. No gallstones, gallbladder wall thickening, or biliary dilatation.  Pancreas: No ductal dilatation or inflammation.  Spleen: Normal in size without focal abnormality.  Adrenals/Urinary Tract: Adrenal glands are unremarkable. Kidneys are normal, without renal calculi, focal lesion, or hydronephrosis. Bladder is unremarkable.  Stomach/Bowel: Lack of enteric contrast and paucity of intra-abdominal fat limits assessment. Distal esophageal wall thickening. The stomach is nondistended. No small bowel dilatation or inflammation. Appendix tentatively identified and normal. Majority of the colon is nondistended, possible mild diffuse colonic wall thickening versus nondistention.  Vascular/Lymphatic: No significant vascular findings are present. No enlarged abdominal or pelvic lymph nodes.  Reproductive: Uterus is unremarkable. Probable corpus luteal cyst in the left ovary. No adnexal mass. Left tubal ligation clips. Small amount of free fluid in the pelvis, likely physiologic.  Other: Small amount of pelvic free fluid. No upper abdominal ascites. No free air or evidence bowel perforation. No intra-abdominal abscess.  Musculoskeletal: There are no acute or suspicious osseous abnormalities.  IMPRESSION: 1. Distal esophageal wall thickening suggesting reflux or esophagitis. 2. Possible diffuse colonic wall thickening in this patient with GI bleed versus nondistention. Mild colitis is considered.    Wendee Beavers, DO 02/10/2017, 8:46 AM PGY-2, [facility] FPTS Intern pager: [phone], text pages welcome

## 2017-02-10 NOTE — Discharge Instructions (Signed)
You were admitted for nausea and vomiting. We have discharged you with a acid blocking medication.   Please call the main hospital number [phone] and ask for financial counseling to follow up on Medicaid application.

## 2017-02-12 LAB — GLUCOSE, CAPILLARY
GLUCOSE-CAPILLARY: 258 mg/dL — AB (ref 65–99)
GLUCOSE-CAPILLARY: 152 mg/dL — AB (ref 65–99)
Glucose-Capillary: 304 mg/dL — ABNORMAL HIGH (ref 65–99)
Glucose-Capillary: 162 mg/dL — ABNORMAL HIGH (ref 65–99)
Glucose-Capillary: 284 mg/dL — ABNORMAL HIGH (ref 65–99)

## 2017-02-15 ENCOUNTER — Ambulatory Visit: Payer: Medicaid Other | Admitting: Internal Medicine

## 2017-03-01 ENCOUNTER — Other Ambulatory Visit: Payer: Self-pay | Admitting: Family Medicine

## 2017-03-01 NOTE — Telephone Encounter (Signed)
Pt has UTI.  There are no appts available today and she has no insurance. She [facility]. Please let pt know if this can be done.

## 2017-03-01 NOTE — Telephone Encounter (Signed)
Lm on unidentified VM asking ot to call back.  See message below, she will need an appt. Fleeger, Maryjo RochesterJessica Dawn, CMA

## 2017-03-01 NOTE — Telephone Encounter (Signed)
Patient needs to come in and be seen if she is concerned about a UTI. Please call her and offer same day vs overflow vs appt tomorrow.

## 2017-03-07 ENCOUNTER — Ambulatory Visit: Payer: Self-pay | Admitting: Family Medicine

## 2017-03-12 ENCOUNTER — Encounter: Payer: Self-pay | Admitting: Student

## 2017-03-12 ENCOUNTER — Other Ambulatory Visit: Payer: Self-pay

## 2017-03-12 ENCOUNTER — Ambulatory Visit (INDEPENDENT_AMBULATORY_CARE_PROVIDER_SITE_OTHER): Payer: Self-pay | Admitting: Student

## 2017-03-12 VITALS — BP 120/74 | HR 91 | Temp 98.0°F | Ht 68.0 in | Wt 157.4 lb

## 2017-03-12 DIAGNOSIS — Z3202 Encounter for pregnancy test, result negative: Secondary | ICD-10-CM

## 2017-03-12 DIAGNOSIS — R81 Glycosuria: Secondary | ICD-10-CM

## 2017-03-12 DIAGNOSIS — N898 Other specified noninflammatory disorders of vagina: Secondary | ICD-10-CM

## 2017-03-12 DIAGNOSIS — R350 Frequency of micturition: Secondary | ICD-10-CM

## 2017-03-12 DIAGNOSIS — N3000 Acute cystitis without hematuria: Secondary | ICD-10-CM | POA: Insufficient documentation

## 2017-03-12 LAB — POCT WET PREP (WET MOUNT)
Clue Cells Wet Prep Whiff POC: NEGATIVE
TRICHOMONAS WET PREP HPF POC: ABSENT

## 2017-03-12 LAB — POCT URINALYSIS DIP (MANUAL ENTRY)
BILIRUBIN UA: NEGATIVE
BILIRUBIN UA: NEGATIVE mg/dL
Glucose, UA: 500 mg/dL — AB
Nitrite, UA: POSITIVE — AB
PROTEIN UA: NEGATIVE mg/dL
SPEC GRAV UA: 1.015 (ref 1.010–1.025)
Urobilinogen, UA: 0.2 E.U./dL
pH, UA: 7 (ref 5.0–8.0)

## 2017-03-12 LAB — POCT UA - MICROSCOPIC ONLY

## 2017-03-12 LAB — POCT URINE PREGNANCY: Preg Test, Ur: NEGATIVE

## 2017-03-12 MED ORDER — FLUCONAZOLE 150 MG PO TABS
150.0000 mg | ORAL_TABLET | Freq: Every day | ORAL | 1 refills | Status: DC
Start: 2017-03-12 — End: 2017-05-29

## 2017-03-12 MED ORDER — CEPHALEXIN 500 MG PO CAPS: 500.0000 mg | ORAL_CAPSULE | Freq: Four times a day (QID) | ORAL | 0 refills | Status: DC

## 2017-03-12 MED FILL — FLUCONAZOLE 150 MG TABLET: 150 | 4 days supply | Qty: 2 | Fill #0

## 2017-03-12 MED FILL — CEPHALEXIN 500 MG CAPSULE: 500 | 5 days supply | Qty: 20 | Fill #0

## 2017-03-12 NOTE — Patient Instructions (Signed)
It was great seeing you today! We have addressed the following issues today  Urinary tract infection: You urine is concerning for urinary tract infection.  We sent a prescription for Keflex to your pharmacy.  Please pick up this prescription and start taking.  Please take this medication until you complete the whole course.  Vaginal discharge: We sent a prescription for Diflucan to your pharmacy.  Please follow the direction of this.  If we did any lab work today, and the results require attention, either me or my nurse will get in touch with you. If everything is normal, you will get a letter in mail and a message via . If you don't hear from us in two weeks, please give us a call. Otherwise, we look forward to seeing you again at your next visit. If you have any questions or concerns before then, please call the clinic at [phone].  Please bring all your medications to every doctors visit  Sign up for My Chart to have easy access to your labs results, and communication with your Primary care physician.    Please check-out at the front desk before leaving the clinic.    Take Care,   Dr. Alanda SlimGonfa
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
## 2017-03-15 LAB — URINE CULTURE

## 2017-04-05 ENCOUNTER — Telehealth: Payer: Self-pay | Admitting: Family Medicine

## 2017-04-05 NOTE — Telephone Encounter (Signed)
Sending to prescribing provider and PCP. Rachel SpillersSharon T Danielly Humphrey, CMA

## 2017-04-05 NOTE — Telephone Encounter (Signed)
Contacted pt and she gave her the below information.  PCP doesn't have anything until 04/13/17 which is a same day appointment.  Scheduled pt on same day with Dr. Jonathon JordanGambino on 04/06/17 (per Dr. Alanda SlimGonfa) to be re evaluated. Lamonte SakaiZimmerman Rumple, Atina Feeley D, [place]

## 2017-04-05 NOTE — Telephone Encounter (Signed)
Pt calling wanting another medication called in for her uti. She was in a couple of weeks ago and she said the uti hasn't gone away yet. Her pharmacy is the Outpatient pharmacy.

## 2017-04-05 NOTE — Telephone Encounter (Signed)
Please advise her to follow up with PCP. I saw her a month ago and recommended follow up with PCP at that time. She was upset for being scheduled with me at that time. Thanks! Alwyn Renaye

## 2017-04-06 ENCOUNTER — Ambulatory Visit: Payer: Medicaid Other | Admitting: Family Medicine

## 2017-04-06 NOTE — Telephone Encounter (Signed)
Thanks white team, agree she needs to be seen. She has poorly controlled diabetes which can lead to frequent UTIs/yeast infections.

## 2017-05-29 ENCOUNTER — Other Ambulatory Visit (HOSPITAL_COMMUNITY)
Admission: RE | Admit: 2017-05-29 | Discharge: 2017-05-29 | Disposition: A | Discharge status: Home / Self Care | Payer: Medicaid Other | Source: Ambulatory Visit | Attending: Family Medicine | Admitting: Family Medicine

## 2017-05-29 ENCOUNTER — Ambulatory Visit (INDEPENDENT_AMBULATORY_CARE_PROVIDER_SITE_OTHER): Payer: Self-pay | Admitting: Internal Medicine

## 2017-05-29 ENCOUNTER — Encounter: Payer: Self-pay | Admitting: Internal Medicine

## 2017-05-29 VITALS — BP 115/80 | HR 77 | Temp 98.6°F | Wt 158.4 lb

## 2017-05-29 DIAGNOSIS — B369 Superficial mycosis, unspecified: Secondary | ICD-10-CM

## 2017-05-29 DIAGNOSIS — E103599 Type 1 diabetes mellitus with proliferative diabetic retinopathy without macular edema, unspecified eye: Secondary | ICD-10-CM

## 2017-05-29 DIAGNOSIS — N898 Other specified noninflammatory disorders of vagina: Secondary | ICD-10-CM

## 2017-05-29 DIAGNOSIS — H6243 Otitis externa in other diseases classified elsewhere, bilateral: Secondary | ICD-10-CM

## 2017-05-29 DIAGNOSIS — R300 Dysuria: Secondary | ICD-10-CM

## 2017-05-29 LAB — POCT UA - MICROALBUMIN
Albumin/Creatinine Ratio, Urine, POC: <30
Creatinine, POC: 50 mg/dL
MICROALBUMIN (UR) POC: 10 mg/L

## 2017-05-29 LAB — POCT WET PREP (WET MOUNT)
Clue Cells Wet Prep Whiff POC: POSITIVE
Trichomonas Wet Prep HPF POC: ABSENT

## 2017-05-29 LAB — POCT URINALYSIS DIP (MANUAL ENTRY)
BILIRUBIN UA: NEGATIVE
BILIRUBIN UA: NEGATIVE mg/dL
GLUCOSE UA: NEGATIVE mg/dL
Leukocytes, UA: NEGATIVE
Nitrite, UA: NEGATIVE
Protein Ur, POC: NEGATIVE mg/dL
RBC UA: NEGATIVE
Spec Grav, UA: 1.01 (ref 1.010–1.025)
Urobilinogen, UA: 0.2 E.U./dL
pH, UA: 7 (ref 5.0–8.0)

## 2017-05-29 LAB — POCT GLYCOSYLATED HEMOGLOBIN (HGB A1C): HEMOGLOBIN A1C: 8

## 2017-05-29 MED ORDER — CLOTRIMAZOLE 1 % EX SOLN: CUTANEOUS | 0 refills | Status: DC

## 2017-05-29 MED ORDER — FLUCONAZOLE 150 MG PO TABS: 150.0000 mg | ORAL_TABLET | Freq: Every day | ORAL | 0 refills | Status: DC

## 2017-05-29 MED ORDER — CLINDAMYCIN HCL 300 MG PO CAPS: 300.0000 mg | ORAL_CAPSULE | Freq: Two times a day (BID) | ORAL | 0 refills | Status: DC

## 2017-05-29 NOTE — Patient Instructions (Signed)
Ms. Diez,  Please use clotrimazole 1% drops for external ear infection. Use 2-3 drops of solution, two or three times daily. Continue to use the drops for at least 14 days after your symptoms have gone.  I will call with your other results.  Best, Dr. Sampson Goon

## 2017-05-30 ENCOUNTER — Encounter: Payer: Self-pay | Admitting: Internal Medicine

## 2017-05-30 DIAGNOSIS — H6243 Otitis externa in other diseases classified elsewhere, bilateral: Secondary | ICD-10-CM

## 2017-05-30 DIAGNOSIS — B369 Superficial mycosis, unspecified: Secondary | ICD-10-CM | POA: Insufficient documentation

## 2017-05-30 NOTE — Assessment & Plan Note (Addendum)
-   Reordered clotrimazole 1% ointment with instructions to continue for another 2 weeks AFTER symptoms resolve. May need re-referral to ENT for thorough cleaning and alternative treatment suggestions if no improvement as at higher risk of necrotizing external otitis with history of diabetes. No jaw pain or lymphadenopathy today.

## 2017-05-30 NOTE — Progress Notes (Signed)
[name] Progress Note  Subjective:  [name] is a 37 y.o. female with history of T1DM with gastroparesis who presents for increased vaginal discharge and lower abdominal discomfort for over a month. Stomach cramping worse over the last week with occurrence of her menstrual cycle. She was treated for BV during hospitalization for DKA in January 2018 and many times in years prior. She was treated for a UTI with keflex in February and initially had improvement in symptoms but feels they "did not totally go away." She has some increased lower abdominal pressure. She has had stomach cramping but no vomiting. She takes reglan prn for nausea, which helps. She has had 1 new sexual partner recently. She is having regular periods; history of tubal ligation. ROS: No dysuria, no increased urinary frequency  Patient also has concern about ear infection. She has been told she has fungal infection of her ears in the past and used "athlete's foot drops" over a year ago to treat this. She continues to have drainage from her ears. She thinks she gets water in her ears when she washes her hair. Has seen ENT before. Occasionally feels some swelling behind her ears but none now. ROS: No fevers, no decreased hearing  She wonders how her diabetes affects her susceptibility for infections. She has been taking her insulin regularly but does not have a glucometer, as she says she does not have insurance and cannot afford supplies.   Allergies  Allergen Reactions  . Levonorgestrel-Ethinyl Estrad Cough    Sneezing, headaches.    Social History   Tobacco Use  . Smoking status: Current Every Day Smoker    Packs/day: 0.33    Years: 14.00    Pack years: 4.62    Types: Cigarettes  . Smokeless tobacco: Never Used  Substance Use Topics  . Alcohol use: Yes    Alcohol/week: 0.0 oz    Comment: 08/21/2012 "might drink 1-2 glasses of liquor once/month"    Objective: Blood pressure 115/80, pulse  77, temperature 98.6 F (37 C), temperature source Oral, weight 158 lb 6.4 oz (71.8 kg), last menstrual period 05/24/2017, SpO2 100 %. Body mass index is 24.08 kg/m. Patient's last menstrual period was 05/24/2017. Constitutional: Well appearing female in NAD HENT: Gray, fluffy debris in bilateral ear canals (R > L). TMs normal bilaterally. No lymphadenopathy or TTP behind ears.  Cardiovascular: RRR, S1, S2, no m/r/g.  Pulmonary/Chest: Effort normal and breath sounds normal.  Abdominal: Soft. +BS, NT Musculoskeletal: No CVA TTP.  GU: Chaperone present. Moderate amount of white discharge. Normal appearing cervix. No cervical motion tenderness on bimanual exam.  Neurological: AOx3, no focal deficits. Psychiatric: Normal mood and affect.  Vitals reviewed  Assessment/Plan: Vaginal discharge - Wet prep positive for BV. No cervical motion tenderness to suggest PID. UA negative for infection and without ketones or glucose.  - Patient requests to try treatment other than metronidazole, as continues to have recurrences. Ordered clindamycin 300 mg BID x 7 days. Also ordered fluconazole in case she develops a yeast infection.  - Risk factors of diabetes and tobacco abuse likely contributing.   Otomycosis of both ears - Reordered clotrimazole 1% ointment with instructions to continue for another 2 weeks AFTER symptoms resolve. May need re-referral to ENT for thorough cleaning and alternative treatment suggestions if no improvement as at higher risk of necrotizing external otitis with history of diabetes. No jaw pain or lymphadenopathy today.    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 Will ask pharmacy/SW if [facility] has any supplies we could offer patient. - Ordered A1c.  Follow-up at earliest convenience to discuss diabetes.   Dani Gobble, MD [name], PGY-3

## 2017-05-30 NOTE — Assessment & Plan Note (Signed)
-   Diabetes likely contributing to recurrent ear and BV infections and patient without a way of checking CBGs. Will ask pharmacy/SW if [facility] has any supplies we could offer patient. - Ordered A1c.

## 2017-05-30 NOTE — Assessment & Plan Note (Signed)
-   Wet prep positive for BV. No cervical motion tenderness to suggest PID. UA negative for infection and without ketones or glucose.  - Patient requests to try treatment other than metronidazole, as continues to have recurrences. Ordered clindamycin 300 mg BID x 7 days. Also ordered fluconazole in case she develops a yeast infection.  - Risk factors of diabetes and tobacco abuse likely contributing.

## 2017-05-31 LAB — CERVICOVAGINAL ANCILLARY ONLY
Chlamydia: NEGATIVE
Neisseria Gonorrhea: NEGATIVE

## 2017-06-04 ENCOUNTER — Encounter: Payer: Self-pay | Admitting: Internal Medicine

## 2017-06-07 ENCOUNTER — Telehealth: Payer: Self-pay | Admitting: Pharmacist

## 2017-06-07 NOTE — Telephone Encounter (Signed)
Called ot follow up question on blood glucose strip access.   She states she has not checked her blood sugars routinely for several years.  Despite this lack of strips she has reasonably good A1C control (A1c of ~ 8 for last three checks.   Discussed using [facility] meter and strips for supply.   Asked to reschedule in Rx clinic in the future with CBGs.  Also asked to reapply for Medicaid at office.   Patient interested in HIP pain evaluation. Asked to come in if pain continiues.

## 2017-06-07 NOTE — Telephone Encounter (Signed)
-----   Message from Casey Burkitt, MD sent at 05/30/2017 10:45 AM EDT ----- Regarding: glucometer and supplies? Hello,  I saw this patient yesterday. She has type 1 diabetes and takes insulin and does not have a glucometer. She says she cannot afford it due to not having insurance. Do we have any free meters and strips I could offer her?  Thank you, [facility]

## 2017-09-19 MED FILL — CLINDAMYCIN HCL 300 MG CAP: 300 | 7 days supply | Qty: 14 | Fill #0

## 2017-09-19 MED FILL — FLUCONAZOLE 150 MG TABS: 150 | 3 days supply | Qty: 2 | Fill #0

## 2017-10-30 ENCOUNTER — Other Ambulatory Visit: Payer: Self-pay

## 2017-10-30 ENCOUNTER — Ambulatory Visit (INDEPENDENT_AMBULATORY_CARE_PROVIDER_SITE_OTHER): Payer: Self-pay | Admitting: Family Medicine

## 2017-10-30 VITALS — BP 138/72 | HR 91 | Temp 98.5°F | Ht 68.0 in | Wt 161.4 lb

## 2017-10-30 DIAGNOSIS — N3000 Acute cystitis without hematuria: Secondary | ICD-10-CM

## 2017-10-30 DIAGNOSIS — R3989 Other symptoms and signs involving the genitourinary system: Secondary | ICD-10-CM

## 2017-10-30 DIAGNOSIS — Z23 Encounter for immunization: Secondary | ICD-10-CM

## 2017-10-30 DIAGNOSIS — Z111 Encounter for screening for respiratory tuberculosis: Secondary | ICD-10-CM

## 2017-10-30 LAB — POCT URINALYSIS DIP (MANUAL ENTRY)
BILIRUBIN UA: NEGATIVE
BILIRUBIN UA: NEGATIVE mg/dL
Glucose, UA: NEGATIVE mg/dL
Leukocytes, UA: NEGATIVE
Nitrite, UA: NEGATIVE
PH UA: 7 (ref 5.0–8.0)
Protein Ur, POC: NEGATIVE mg/dL
RBC UA: NEGATIVE
SPEC GRAV UA: 1.01 (ref 1.010–1.025)
Urobilinogen, UA: 0.2 E.U./dL

## 2017-10-30 MED ORDER — CEPHALEXIN 500 MG PO CAPS: 500.0000 mg | ORAL_CAPSULE | Freq: Four times a day (QID) | ORAL | 0 refills | Status: AC

## 2017-10-30 MED FILL — CEPHALEXIN 500 MG CAPSULE: 500 | 5 days supply | Qty: 20 | Fill #0

## 2017-10-30 NOTE — Patient Instructions (Addendum)
It was nice meeting you today Ms. [name]!  We are going to go ahead and treat you for a urinary tract infection since you have symptoms consistent with this and you've had these in the past.  Please take Keflex four times per day for five days, and return to clinic in one week if your symptoms have not resolved.  If you have any questions or concerns, please feel free to call the clinic.   Be well,  Dr. Frances Furbish

## 2017-10-30 NOTE — Progress Notes (Signed)
Subjective:    [name] - 37 y.o. female MRN [id number]  Date of birth: Jun 08, 1980  HPI  [name] is here for concerns for a UTI. - started vomiting on Monday, September 23 - began having crampy stomach pain and back pain on September 25 - went to the hospital in [place] but was not treated for a UTI or kidney infection even though she told them that's what it was, so she "got mad and left" - has tried ibuprofen for symptom relief, which has been minimally helpful - would have chills, but has not measured a fever - no diarrhea and no dysuria, but does not a foul smell to her urine, which has been intermittent - endorses frequency and urgency; these symptoms have also been intermittent - had increase in frequency of bowel movements last week, but they were not loose or watery - vomiting, stomach cramps are how she usually presents with a UTI - no changes in vaginal discharge, but did have some vulvar itching before this episode began, which resolved with monistat cream      Health Maintenance:  - patient requests flu shot today.  Patient also requests ppd placement for work Health Maintenance Due  Topic Date Due  . FOOT EXAM  12/04/1990  . OPHTHALMOLOGY EXAM  04/29/2016  . INFLUENZA VACCINE  08/30/2017    -  reports that she has been smoking cigarettes. She has a 4.62 pack-year smoking history. She has never used smokeless tobacco. - Review of Systems: Per HPI. - Past Medical History: Patient Active Problem List   Diagnosis Date Noted  . Otomycosis of both ears 05/30/2017  . Acute cystitis without hematuria 03/12/2017  . Abdominal pain   . Intractable nausea and vomiting 02/08/2017  . Marijuana abuse   . Mixed hyperlipidemia   . Vaginal discharge 11/09/2016  . Hair loss 11/09/2016  . Chronic left shoulder pain 08/14/2016  . Left elbow pain 07/10/2016  . DKA (diabetic ketoacidoses) (HCC) 06/27/2016  . DKA, type 1 (HCC) 06/26/2016  . Otomycosis of left  ear 09/15/2015  . Mild nonproliferative diabetic retinopathy (HCC) 07/25/2013  . Intractable cyclical vomiting with nausea 07/24/2013  . Contraception 04/16/2013  . Unspecified constipation 08/28/2012  . Anemia 04/19/2012  . Essential hypertension, benign 04/19/2012  . Gastroparesis 03/11/2012  . TRIGGER FINGER, RIGHT THUMB 05/24/2009  . TOBACCO USER 11/07/2008  . 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 HYPERCHOLESTEROLEMIA 03/29/2006   - Medications: reviewed and updated   Objective:   Physical Exam BP 138/72   Pulse 91   Temp 98.5 F (36.9 C) (Oral)   Ht 5\' 8"  (1.727 m)   Wt 161 lb 6.4 oz (73.2 kg)   SpO2 100%   BMI 24.54 kg/m  Gen: NAD, alert, cooperative with exam, well-appearing CV: RRR, good S1/S2, no murmur Resp: CTABL, no wheezes, non-labored Abd: SNTND, BS present, no guarding or organomegaly, no CVA tenderness, no pelvic tenderness     Assessment & Plan:   Acute cystitis without hematuria Since patient has had a positive culture for E. coli in the recent past and has a diagnosis of type 1 diabetes as well as urinary symptoms today, we will empirically treat for a UTI.  I am not concerned about pyelonephritis at this time given her lack of fever and other constitutional symptoms today as well as her lack of CVA tenderness.  I will prescribe Keflex 500 mg 4 times daily for 5 days.  Patient  given return precautions.    Lezlie Octave, M.D. 10/30/2017, 12:14 PM PGY-2, [facility]

## 2017-10-30 NOTE — Assessment & Plan Note (Signed)
Since patient has had a positive culture for E. coli in the recent past and has a diagnosis of type 1 diabetes as well as urinary symptoms today, we will empirically treat for a UTI.  I am not concerned about pyelonephritis at this time given her lack of fever and other constitutional symptoms today as well as her lack of CVA tenderness.  I will prescribe Keflex 500 mg 4 times daily for 5 days.  Patient given return precautions.

## 2017-11-01 ENCOUNTER — Ambulatory Visit: Payer: Medicaid Other

## 2017-11-01 ENCOUNTER — Ambulatory Visit (INDEPENDENT_AMBULATORY_CARE_PROVIDER_SITE_OTHER): Payer: Medicaid Other

## 2017-11-01 ENCOUNTER — Telehealth: Payer: Self-pay

## 2017-11-01 DIAGNOSIS — Z111 Encounter for screening for respiratory tuberculosis: Secondary | ICD-10-CM

## 2017-11-01 LAB — TB SKIN TEST
INDURATION: 0 mm
TB Skin Test: NEGATIVE

## 2017-11-01 NOTE — Progress Notes (Signed)
   Patient here today to have PPD site read.   PPD read and results entered in Epic. Result: 0 mm induration. Interpretation: Negative Letters given for employer.  Matalyn Nawaz, RN ([facility] RN) 

## 2018-01-03 MED FILL — CLOTRIMAZOLE 1 % SOLN: 1 | 4 days supply | Qty: 10 | Fill #0

## 2018-01-07 ENCOUNTER — Ambulatory Visit: Payer: Medicaid Other | Admitting: Family Medicine

## 2018-01-07 NOTE — Telephone Encounter (Signed)
Error

## 2018-01-17 ENCOUNTER — Ambulatory Visit: Payer: Medicaid Other | Admitting: Family Medicine

## 2018-02-04 ENCOUNTER — Ambulatory Visit: Payer: Medicaid Other | Admitting: Family Medicine

## 2018-02-13 ENCOUNTER — Telehealth: Payer: Self-pay | Admitting: *Deleted

## 2018-02-13 NOTE — Telephone Encounter (Signed)
Pt is requesting a refill on valtrex and flagyl.   States that she does not have money for an appt and was told to call whenever she was having symptoms (discharge and fishy smell) and we could call her in some medication.  Will forward to MD. [name], [name], CMA

## 2018-02-13 NOTE — Telephone Encounter (Signed)
I did not tell her that we would call in medication for vaginal discharge. I would like for her to be seen both for this and her diabetes, which we are overdue to check on. Please offer her next available appt with me or pool.

## 2018-02-18 ENCOUNTER — Telehealth: Payer: Self-pay | Admitting: Family Medicine

## 2018-02-18 NOTE — Telephone Encounter (Signed)
LVM for pt to call office back to inform her of below and to assist her in getting an appointment scheduled. Zimmerman Rumple, April D, CMA  

## 2018-02-18 NOTE — Telephone Encounter (Signed)
Called patient to invite her to [facility] clinic. Initial cell phone number - female answered and stated this was no longer her phone number. Called family - father Danelle Earthly, generic VM. Called employer contact info - [employer] - no answer and  VM full. Will try to [employer].

## 2018-02-20 NOTE — Telephone Encounter (Signed)
Called number listed for pt and a man answered the phone and stated that if we don't quit calling this number he was going to"come f [place] up", he said this is not [name] number and hung up the phone. The number I dialed was [phone].  Spoke with Annice Pih and will remove number from pt contact information. If pt calls please get a number that pt can be reached at.  Lamonte Sakai, April D, [place]

## 2018-02-26 ENCOUNTER — Other Ambulatory Visit: Payer: Self-pay | Admitting: Student in an Organized Health Care Education/Training Program

## 2018-02-26 ENCOUNTER — Other Ambulatory Visit: Payer: Self-pay | Admitting: Family Medicine

## 2018-02-26 MED FILL — metroNIDAZOLE 500 MG TABS: 500 | 7 days supply | Qty: 14 | Fill #0

## 2018-02-26 MED FILL — valACYclovir HCL 1 GM TABS: 1 | 10 days supply | Qty: 20 | Fill #0

## 2018-02-26 NOTE — Telephone Encounter (Signed)
I have discussed with Dr. Chanetta Marshall who has contacted patient.  We have mutual agreement on the plan.

## 2018-02-26 NOTE — Telephone Encounter (Signed)
Pt calls back.  She is upset but appropriate.  She states that she does not have insurance and Cant afford to pay for a visit and for her meds out of pocket.  Also states that " I know whats wrong with me, these 2 meds are not something new.  Just because my doctors change does not mean that my conditions do"  She would like Dr. Leveda Anna to give her a call because he "knows her".  Will forward to both Dr. Leveda Anna and Dr. Chanetta Marshall. FYI: the numbers in chart are now correct. Tashawna Thom, Maryjo Rochester, CMA

## 2018-02-26 NOTE — Telephone Encounter (Signed)
Called patient back and she is having vaginal discharge and bumps. 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 No fever. Discharge over 1 month. No new partners. One female partner. She denies severe pain or abdominal pain.  While ideally she would come in regularly for diabetes and also to be assessed for vaginitis, we will agreed to call in both Valtrex and Flagyl now.  In exchange, she will call us or be seen if her vaginal symptoms worsen or she feels badly.  And also she will call our office to make an appointment with me in the next 6 weeks.  I also suggested that she should asked to speak to [facility] about getting back on the orange card in order to help her come in to be seen at a reduced rate. Discussed this with Dr. Leveda Anna, she had asked to speak to him but I told her I would let him know what we discussed.

## 2018-03-07 MED FILL — metroNIDAZOLE 500 MG TABS: 500 | 7 days supply | Qty: 14 | Fill #0

## 2018-03-07 MED FILL — valACYclovir HCL 1 GM TABS: 1 | 10 days supply | Qty: 20 | Fill #0

## 2018-06-27 ENCOUNTER — Ambulatory Visit (INDEPENDENT_AMBULATORY_CARE_PROVIDER_SITE_OTHER): Payer: Medicaid Other | Admitting: Family Medicine

## 2018-06-27 ENCOUNTER — Encounter: Payer: Self-pay | Admitting: Family Medicine

## 2018-06-27 ENCOUNTER — Other Ambulatory Visit (HOSPITAL_COMMUNITY)
Admission: RE | Admit: 2018-06-27 | Discharge: 2018-06-27 | Disposition: A | Discharge status: Home / Self Care | Payer: Medicaid Other | Source: Ambulatory Visit | Attending: Family Medicine | Admitting: Family Medicine

## 2018-06-27 ENCOUNTER — Other Ambulatory Visit: Payer: Self-pay

## 2018-06-27 VITALS — BP 118/78 | HR 79

## 2018-06-27 DIAGNOSIS — E103599 Type 1 diabetes mellitus with proliferative diabetic retinopathy without macular edema, unspecified eye: Secondary | ICD-10-CM | POA: Diagnosis not present

## 2018-06-27 DIAGNOSIS — N898 Other specified noninflammatory disorders of vagina: Secondary | ICD-10-CM

## 2018-06-27 DIAGNOSIS — N926 Irregular menstruation, unspecified: Secondary | ICD-10-CM | POA: Diagnosis not present

## 2018-06-27 DIAGNOSIS — Z113 Encounter for screening for infections with a predominantly sexual mode of transmission: Secondary | ICD-10-CM

## 2018-06-27 LAB — POCT WET PREP (WET MOUNT)
Clue Cells Wet Prep Whiff POC: NEGATIVE
Trichomonas Wet Prep HPF POC: ABSENT

## 2018-06-27 MED ORDER — FLUCONAZOLE 150 MG PO TABS: 150.0000 mg | ORAL_TABLET | Freq: Every day | ORAL | 0 refills | Status: DC

## 2018-06-27 MED FILL — FLUCONAZOLE 150 MG TABS: 150 | 2 days supply | Qty: 2 | Fill #0

## 2018-06-27 NOTE — Progress Notes (Signed)
CC: f/u T1DM, ear fullness, vaginal discharge   HPI  9.3 in February 2020 in [place]. She is currently using 70/30 about 24U in the am and 13-15U in evening. Occasional low at night, this is why she is doing some 13s at night. CBGs in the am 80-200.   She has vaginal discharge and itching. She went swimming 1.5 weeks ago. No new partners. Men, one partner. Does want STI testing.   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 Been present x 1 year. Occasional dropping, but not regularly. No consistent weakness.   Her ear feels full of wax, no pain. Some decreased hearing. Both feel blocked up. She has been pulling out clumps with a bobby pin. [facility]    She is back and forth from [place]. Saw a PCP There as well.   Tried monistat 3 day the last few days.   LMP May 1-5, no concern for pregnancy   ROS: Denies CP, SOB, abdominal pain, dysuria, changes in BMs.   CC, SH/smoking status, and VS noted  Objective: BP 118/78   Pulse 79   LMP 04/28/2018 (Exact Date)   SpO2 99%  Gen: NAD, alert, cooperative, and pleasant. 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 She has gotten some care recently in [place] as she sometimes lives there.  I counseled her that she will likely need to have one or the other as her PCP [facility].  She states she has been self titrating her 7030.  I tried to discharge her from [facility] with long-acting and short acting but she felt like this caused her sugars to be more abnormal.  She has known retinopathy, for which she sees the optometrist who recommended injections.   She also states today that she has burning of bilateral hands and feet.  Normal exam and monofilament intact.  I suspect this is likely neuropathy due to longstanding poorly controlled diabetes.  Will start low-dose gabapentin nightly.  She can titrate up at home if this is of some benefit but not enough.  Recheck in 3 months.  Vaginal discharge Patient concerned with yeast, she did use over-the-counter yeast products this week which may cause her wet prep to be negative.  She states she is having persistent itching so we will treat with Diflucan.  Ear fullness: Ear exam completely normal with clear TMs and clear canals.  She does has a history of otomycosis but no signs of such on exam today.  Counseled her not to use foreign bodies to remove wax from her own ears. Orders Placed This Encounter  Procedures  . HIV Antibody (routine testing w rflx)  . RPR  . CBC  . Basic metabolic panel  . Microalbumin/Creatinine Ratio, Urine  . Hemoglobin A1c  . POCT Wet Prep [employer](Wet [place])  . POCT urine pregnancy    Meds ordered this encounter  Medications  . fluconazole (DIFLUCAN) 150 MG tablet    Sig: Take 1 tablet (150 mg total) by mouth daily. May repeat in 72 hours if no improvement    Dispense:  2 tablet    Refill:  0  Seen on 5/28, chart closed late on 6/1 as lab was pending and not put in correctly.   Loni Muse, MD, PGY3 06/30/2018 1:40 PM

## 2018-06-27 NOTE — Patient Instructions (Addendum)
For your burning pain, you have something called peripheral neuropathy. We can work on the symptoms with the gabapentin medication. You can take this 300mg  at night, and if after 2 weeks it is helping some but not as much as you would like you can take 600mg  every night. Call me if you have questions.   We will call you with STD and blood test results.   I would go with the plan your eye doctor recommended.   Your ears look fine.   Use the yeast pill if you still have itching the next couple of days.

## 2018-06-28 LAB — CBC
Hematocrit: 38.4 % (ref 34.0–46.6)
Hemoglobin: 12.5 g/dL (ref 11.1–15.9)
MCH: 30.1 pg (ref 26.6–33.0)
MCHC: 32.6 g/dL (ref 31.5–35.7)
MCV: 93 fL (ref 79–97)
Platelets: 300 10*3/uL (ref 150–450)
RBC: 4.15 x10E6/uL (ref 3.77–5.28)
RDW: 14.9 % (ref 11.7–15.4)
WBC: 5.9 10*3/uL (ref 3.4–10.8)

## 2018-06-28 LAB — BASIC METABOLIC PANEL
BUN/Creatinine Ratio: 9 (ref 9–23)
BUN: 10 mg/dL (ref 6–20)
CO2: 20 mmol/L (ref 20–29)
Calcium: 9.6 mg/dL (ref 8.7–10.2)
Chloride: 100 mmol/L (ref 96–106)
Creatinine, Ser: 1.07 mg/dL — ABNORMAL HIGH (ref 0.57–1.00)
GFR calc Af Amer: 77 mL/min/{1.73_m2} (ref >59)
GFR calc non Af Amer: 66 mL/min/{1.73_m2} (ref >59)
Glucose: 95 mg/dL (ref 65–99)
Potassium: 4.2 mmol/L (ref 3.5–5.2)
Sodium: 135 mmol/L (ref 134–144)

## 2018-06-28 LAB — MICROALBUMIN / CREATININE URINE RATIO
Creatinine, Urine: 86 mg/dL
Microalb/Creat Ratio: <3 mg/g creat (ref 0–29)
Microalbumin, Urine: <3 ug/mL

## 2018-06-28 LAB — CERVICOVAGINAL ANCILLARY ONLY
Chlamydia: NEGATIVE
Neisseria Gonorrhea: NEGATIVE

## 2018-06-28 LAB — HIV ANTIBODY (ROUTINE TESTING W REFLEX): HIV Screen 4th Generation wRfx: NONREACTIVE

## 2018-06-28 LAB — RPR: RPR Ser Ql: NONREACTIVE

## 2018-06-30 NOTE — Assessment & Plan Note (Signed)
Patient concerned with yeast, she did use over-the-counter yeast products this week which may cause her wet prep to be negative.  She states she is having persistent itching so we will treat with Diflucan.

## 2018-06-30 NOTE — Assessment & Plan Note (Signed)
Sending serum a1c today. She has gotten some care recently in [place] as she sometimes lives there.  I counseled her that she will likely need to have one or the other as her PCP due to [facility] rules.  She states she has been self titrating her 7030.  I tried to discharge her from [facility] with long-acting and short acting but she felt like this caused her sugars to be more abnormal.  She has known retinopathy, for which she sees the optometrist who recommended injections.  She also states today that she has burning of bilateral hands and feet.  Normal exam and monofilament intact.  I suspect this is likely neuropathy due to longstanding poorly controlled diabetes.  Will start low-dose gabapentin nightly.  She can titrate up at home if this is of some benefit but not enough.  Recheck in 3 months.

## 2018-07-01 LAB — POCT URINE PREGNANCY: Preg Test, Ur: NEGATIVE

## 2018-07-02 ENCOUNTER — Telehealth: Payer: Self-pay | Admitting: Family Medicine

## 2018-07-02 DIAGNOSIS — M25511 Pain in right shoulder: Secondary | ICD-10-CM

## 2018-07-02 LAB — HEMOGLOBIN A1C
Est. average glucose Bld gHb Est-mCnc: 206 mg/dL
Hgb A1c MFr Bld: 8.8 % — ABNORMAL HIGH (ref 4.8–5.6)

## 2018-07-02 LAB — SPECIMEN STATUS REPORT

## 2018-07-02 NOTE — Telephone Encounter (Signed)
Called patient to relay recent lab results: no answer, left generic vm.   hgba1c elevated to 8.8. keep using her 70/30 and call me with home CBG checks. If she is willing, I think it would be helpful to do a virtual visit with Dr. Valentina Lucks to discuss CBGs, she has met him before. Please schedule this is she is willing. If not, maybe virtual visit with me in 2 weeks.   kidney function stable.  Blood counts normal.  HIV/RPR negative.  GC negative.

## 2018-07-02 NOTE — Telephone Encounter (Signed)
Patient called back, I relayed these labs. She says she was referred to the endocrinologist in [place] and says she thinks she was going to be seen end of June. She says she will call back and recheck this appt now. I also forgot to send sports med referral for shoulder pain, will do that now.

## 2018-07-09 ENCOUNTER — Ambulatory Visit: Payer: Medicaid Other | Admitting: Family Medicine

## 2018-08-21 ENCOUNTER — Encounter: Payer: Self-pay | Admitting: Family Medicine

## 2019-01-10 ENCOUNTER — Other Ambulatory Visit: Payer: Self-pay

## 2019-01-10 ENCOUNTER — Other Ambulatory Visit (HOSPITAL_COMMUNITY)
Admission: RE | Admit: 2019-01-10 | Discharge: 2019-01-10 | Disposition: A | Discharge status: Home / Self Care | Payer: Medicaid Other | Source: Ambulatory Visit | Attending: Family Medicine | Admitting: Family Medicine

## 2019-01-10 ENCOUNTER — Ambulatory Visit (INDEPENDENT_AMBULATORY_CARE_PROVIDER_SITE_OTHER): Payer: Medicaid Other | Admitting: Family Medicine

## 2019-01-10 VITALS — BP 96/58 | HR 86 | Temp 99.0°F | Wt 150.0 lb

## 2019-01-10 DIAGNOSIS — Z23 Encounter for immunization: Secondary | ICD-10-CM | POA: Diagnosis not present

## 2019-01-10 DIAGNOSIS — Z3202 Encounter for pregnancy test, result negative: Secondary | ICD-10-CM | POA: Diagnosis not present

## 2019-01-10 DIAGNOSIS — N898 Other specified noninflammatory disorders of vagina: Secondary | ICD-10-CM | POA: Diagnosis not present

## 2019-01-10 DIAGNOSIS — Z720 Tobacco use: Secondary | ICD-10-CM | POA: Diagnosis not present

## 2019-01-10 DIAGNOSIS — Z30013 Encounter for initial prescription of injectable contraceptive: Secondary | ICD-10-CM

## 2019-01-10 DIAGNOSIS — E103599 Type 1 diabetes mellitus with proliferative diabetic retinopathy without macular edema, unspecified eye: Secondary | ICD-10-CM

## 2019-01-10 DIAGNOSIS — Z111 Encounter for screening for respiratory tuberculosis: Secondary | ICD-10-CM

## 2019-01-10 LAB — POCT URINE PREGNANCY: Preg Test, Ur: NEGATIVE

## 2019-01-10 LAB — POCT WET PREP (WET MOUNT)
Clue Cells Wet Prep Whiff POC: NEGATIVE
Trichomonas Wet Prep HPF POC: ABSENT

## 2019-01-10 MED ORDER — FLUCONAZOLE 150 MG PO TABS: 150.0000 mg | ORAL_TABLET | Freq: Every day | ORAL | 0 refills | Status: DC

## 2019-01-10 MED ORDER — MEDROXYPROGESTERONE ACETATE 150 MG/ML IM SUSP
150.0000 mg | Freq: Once | INTRAMUSCULAR | Status: AC
  Administered 2019-01-10: 150 mg via INTRAMUSCULAR

## 2019-01-10 MED FILL — FLUCONAZOLE 150 MG TABLET: 150 | 3 days supply | Qty: 2 | Fill #0

## 2019-01-10 NOTE — Assessment & Plan Note (Signed)
Has not followed up for this since 05/2018.  States she feels she has been well managed on her 7030, however last A1c 8.8.  Highly encouraged her to follow-up as soon as possible to discuss her diabetes and ensure appropriate regimen.

## 2019-01-10 NOTE — Progress Notes (Addendum)
Subjective:    Patient ID: [name], female    DOB: [birth year], 38 y.o.   MRN: [id number]   CC: Vaginal discharge  HPI: [name] is a 38 year old female with type 1 diabetes with retinopathy, tobacco and marijuana use presenting discuss the following:  Vaginal discharge  contraception: Present for about the last 2 weeks, white and thick.  Endorses vaginal itchiness and irritation.  Tried over-the-counter vaginal yeast treatment the first week it occurred for 3 days, made some improvement but then came right back.  Denies any concurrent dysuria, abdominal pain, fever.  She is sexually active approximately 3 weeks ago, had a normal menstrual cycle after that point.  LMP 11/20.  Did not use barrier protection.  Would like to have STD screen today.  Had a tubal ligation in 2012, however was also informed to continue using contraception due to the high risk associated if she were to become pregnant.  Has used Depo several times in the past and has worked well for her, discussed alternative options including IUD and Nexplanon, she is not interested in these.  She is a half pack smoker daily.   Smoking status reviewed-smokes about 1 pack every 2-3 days.  Is somewhat interested in quitting, does not want to use any replacement therapy to do so.  Review of Systems Per HPI    Objective:  BP (!) 96/58   Pulse 86   Temp 99 F (37.2 C) (Oral)   Wt 150 lb (68 kg)   LMP 12/20/2018   SpO2 95%   BMI 22.81 kg/m  Vitals and nursing note reviewed  General: NAD, pleasant Respiratory: Unlabored breathing Abdomen: soft GY:IRSWNI exam: normal external genitalia, vulva, vagina, cervix, uterus and adnexa, VULVA: normal appearing vulva with no masses, tenderness or lesions, VAGINA: vaginal discharge - white and curd-like, CERVIX: normal appearing cervix without discharge or lesions, cervical motion tenderness absent, WET MOUNT done - results: many bacteria without clue cells present, exam  chaperoned by CMA. Skin: warm and dry, no rashes noted Neuro: alert and oriented, no focal deficits Psych: normal affect  Assessment & Plan:   Vaginal discharge White, curd-like discharge with vulvar pruritus consistent with yeast infection.  Suspect her wet prep is negative due to recent over-the-counter product use, however also performed STD screen including GC/chlamydia, HIV, RPR per patient request.  Will go ahead and treat with Diflucan and have her follow-up if symptoms not improving.   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  States she feels she has been well managed on her 7030, however last A1c 8.8.  Highly encouraged her to follow-up as soon as possible to discuss her diabetes and ensure appropriate regimen.  Contraception History of tubal ligation in 2012, however previously told to continue contraception due to her high risk if she were to become pregnant.  She has had Depo numerous times in the past with success, would like to start this again today after discussion about alternative methods as well.  Upreg negative in the office.  Given Depo injection, tolerated well.  Encouraged her to use condoms when she is sexually active to help prevent pregnancy and STD transmission.  Tobacco abuse ~1/2 ppd day.  In contemplative stage for quitting.  Not interested in any NRT.  Let her know that we are able to help in alternative ways other than medications if needed, she will reach out when she is ready to quit.   Additionally  received TB skin test today for her work, will follow up on Monday.    Follow-up if symptoms not improving, otherwise follow-up for diabetes.   Leticia Penna DO Family Medicine Resident PGY-2

## 2019-01-10 NOTE — Assessment & Plan Note (Addendum)
White, curd-like discharge with vulvar pruritus consistent with yeast infection.  Suspect her wet prep is negative due to recent over-the-counter product use, however also performed STD screen including GC/chlamydia, HIV, RPR per patient request.  Will go ahead and treat with Diflucan and have her follow-up if symptoms not improving.

## 2019-01-10 NOTE — Progress Notes (Signed)
Patient presents for depo injection and TB skin test. Depo given LUOQ, site unremarkable. Patient to return for next injection, 02/26-03/12, reminder card given.  TB skin test applied to left forearm, wheel present. Patient to return on Monday to have site read. Reminder card given.

## 2019-01-10 NOTE — Assessment & Plan Note (Addendum)
History of tubal ligation in 2012, however previously told to continue contraception due to her high risk if she were to become pregnant.  She has had Depo numerous times in the past with success, would like to start this again today after discussion about alternative methods as well.  Upreg negative in the office.  Given Depo injection, tolerated well.  Encouraged her to use condoms when she is sexually active to help prevent pregnancy and STD transmission.

## 2019-01-10 NOTE — Assessment & Plan Note (Signed)
~  1/2 ppd day.  In contemplative stage for quitting.  Not interested in any NRT.  Let her know that we are able to help in alternative ways other than medications if needed, she will reach out when she is ready to quit.

## 2019-01-10 NOTE — Patient Instructions (Signed)
It was wonderful seeing you today.  We have given you Depo for contraception including you already having her tubes tied.  I highly encourage you to use condoms when you are sexually active to avoid any sexually transmitted diseases.  Please let me know if your symptoms are not improved with the treatment and I will send into your pharmacy.  I encourage you to follow-up at your next convenience to discuss her diabetes and make sure that this is still looking well.

## 2019-01-11 LAB — HIV ANTIBODY (ROUTINE TESTING W REFLEX): HIV Screen 4th Generation wRfx: NONREACTIVE

## 2019-01-11 LAB — RPR: RPR Ser Ql: NONREACTIVE

## 2019-01-13 ENCOUNTER — Other Ambulatory Visit: Payer: Self-pay

## 2019-01-13 ENCOUNTER — Ambulatory Visit: Payer: Medicaid Other

## 2019-01-13 ENCOUNTER — Ambulatory Visit: Payer: Medicaid Other | Admitting: Family Medicine

## 2019-01-13 DIAGNOSIS — Z111 Encounter for screening for respiratory tuberculosis: Secondary | ICD-10-CM

## 2019-01-13 LAB — TB SKIN TEST
Induration: 0 mm
TB Skin Test: NEGATIVE

## 2019-01-13 NOTE — Progress Notes (Signed)
PPD Reading Note  PPD read and results entered in EpicCare.  Result: 0 mm induration.  Interpretation: Negative  Allergic reaction: no

## 2019-01-14 LAB — CERVICOVAGINAL ANCILLARY ONLY
Chlamydia: NEGATIVE
Comment: NEGATIVE
Comment: NORMAL
Neisseria Gonorrhea: NEGATIVE

## 2019-02-03 MED FILL — FLUCONAZOLE 150 MG TABLET: 150 | 2 days supply | Qty: 2 | Fill #0

## 2019-04-17 ENCOUNTER — Ambulatory Visit: Payer: Medicaid Other | Admitting: Family Medicine

## 2019-07-21 ENCOUNTER — Telehealth: Payer: Self-pay

## 2019-07-21 NOTE — Telephone Encounter (Signed)
Patient calls nurse line requesting refill on insulin syringes. Unable to locate on medication list. Called and spoke with pharmacist. Jovita Gamma verbal order for refill of insulin syringes with 3 refills.   FYI to PCP  Veronda Prude, RN

## 2019-07-22 ENCOUNTER — Other Ambulatory Visit (HOSPITAL_COMMUNITY)
Admission: RE | Admit: 2019-07-22 | Discharge: 2019-07-22 | Disposition: A | Discharge status: Home / Self Care | Payer: Medicaid Other | Source: Ambulatory Visit | Attending: Family Medicine | Admitting: Family Medicine

## 2019-07-22 ENCOUNTER — Other Ambulatory Visit: Payer: Self-pay

## 2019-07-22 ENCOUNTER — Ambulatory Visit (INDEPENDENT_AMBULATORY_CARE_PROVIDER_SITE_OTHER): Payer: Medicaid Other | Admitting: Family Medicine

## 2019-07-22 ENCOUNTER — Encounter: Payer: Self-pay | Admitting: Family Medicine

## 2019-07-22 VITALS — BP 118/64 | HR 72 | Ht 68.0 in | Wt 166.6 lb

## 2019-07-22 DIAGNOSIS — N898 Other specified noninflammatory disorders of vagina: Secondary | ICD-10-CM

## 2019-07-22 DIAGNOSIS — E103599 Type 1 diabetes mellitus with proliferative diabetic retinopathy without macular edema, unspecified eye: Secondary | ICD-10-CM

## 2019-07-22 LAB — POCT WET PREP (WET MOUNT)
Clue Cells Wet Prep Whiff POC: POSITIVE
Trichomonas Wet Prep HPF POC: ABSENT

## 2019-07-22 NOTE — Progress Notes (Signed)
° ° °  SUBJECTIVE:   CHIEF COMPLAINT / HPI:   Vaginal Discharge and Odor Patient states she has been having abdominal cramping and vaginal discharge with odor for 3 weeks. She denies fever, chills, but has had nausea and one episode of vomiting since her symptoms began. She states the cramping seems to be correlated with her period but felt worse this time. She has tried Motrin to make it better and it didn't help but usually does help. Cramping lasts about 15-30 minutes and is intermittent and occurs every day. She did have a regular period that ended 5 days ago. No new partner but is having unprotected sex with her partner and she states she is monogamous with him.   PERTINENT  PMH / PSH: Hx of BV, T1DM  OBJECTIVE:   BP 118/64    Pulse 72    Ht 5\' 8"  (1.727 m)    Wt 166 lb 9.6 oz (75.6 kg)    SpO2 97%    BMI 25.33 kg/m   Gen: NAD Physical Exam Vitals reviewed. Exam conducted with a chaperone present.  Genitourinary:    Exam position: Supine.     Pubic Area: No rash.      Labia:        Right: No rash, tenderness, lesion or injury.        Left: No rash, tenderness, lesion or injury.      Vagina: No signs of injury. Vaginal discharge present. No erythema, tenderness, bleeding or lesions.     Cervix: No cervical motion tenderness, discharge, lesion or erythema.      ASSESSMENT/PLAN:   Vaginal discharge Patient positive for BV. Called and informed her of results and sent in metronidazole 500mg  BID for 7 days - Patient declined HIV and RPR testing as she had recently had those done.     , DO [facility]

## 2019-07-22 NOTE — Patient Instructions (Signed)
It was great to meet you today! Thank you for letting me participate in your care!  Today, we discussed your symptoms and I will call you if any of your lab work is abnormal and will send in a prescription for an antibiotic if needed.  Be well, Jules Schick, DO PGY-3, [name]

## 2019-07-23 ENCOUNTER — Other Ambulatory Visit: Payer: Self-pay | Admitting: Family Medicine

## 2019-07-23 LAB — CERVICOVAGINAL ANCILLARY ONLY
Chlamydia: NEGATIVE
Comment: NEGATIVE
Comment: NORMAL
Neisseria Gonorrhea: NEGATIVE

## 2019-07-23 MED ORDER — METRONIDAZOLE 500 MG PO TABS: 500.0000 mg | ORAL_TABLET | Freq: Two times a day (BID) | ORAL | 0 refills | Status: DC

## 2019-07-23 MED FILL — metroNIDAZOLE 500 MG TABS: 500 | 7 days supply | Qty: 14 | Fill #0

## 2019-07-23 NOTE — Progress Notes (Signed)
Sent to Pharmacy in [place]

## 2019-07-23 NOTE — Progress Notes (Signed)
Patient tested positive for BV. Sending in metronidazole

## 2019-07-24 NOTE — Assessment & Plan Note (Signed)
Patient positive for BV. Called and informed her of results and sent in metronidazole 500mg  BID for 7 days - Patient declined HIV and RPR testing as she had recently had those done.

## 2019-08-08 ENCOUNTER — Other Ambulatory Visit: Payer: Self-pay

## 2019-08-08 ENCOUNTER — Ambulatory Visit (INDEPENDENT_AMBULATORY_CARE_PROVIDER_SITE_OTHER): Payer: Medicaid Other | Admitting: Family Medicine

## 2019-08-08 ENCOUNTER — Encounter: Payer: Self-pay | Admitting: Family Medicine

## 2019-08-08 VITALS — BP 110/66 | HR 86 | Ht 68.0 in | Wt 173.0 lb

## 2019-08-08 DIAGNOSIS — Z720 Tobacco use: Secondary | ICD-10-CM

## 2019-08-08 DIAGNOSIS — M79605 Pain in left leg: Secondary | ICD-10-CM | POA: Diagnosis not present

## 2019-08-08 DIAGNOSIS — Z1231 Encounter for screening mammogram for malignant neoplasm of breast: Secondary | ICD-10-CM

## 2019-08-08 DIAGNOSIS — M79604 Pain in right leg: Secondary | ICD-10-CM

## 2019-08-08 DIAGNOSIS — E103599 Type 1 diabetes mellitus with proliferative diabetic retinopathy without macular edema, unspecified eye: Secondary | ICD-10-CM

## 2019-08-08 LAB — POCT GLYCOSYLATED HEMOGLOBIN (HGB A1C): HbA1c, POC (controlled diabetic range): 8.6 % — AB (ref 0.0–7.0)

## 2019-08-08 MED ORDER — ACCU-CHEK GUIDE VI STRP: ORAL_STRIP | 12 refills | Status: DC

## 2019-08-08 NOTE — Patient Instructions (Signed)
Thank you for coming to see me today. It was a pleasure. Today we talked about:   Increase Novolin 23 units in the morning.  Send me a picture of your syringes.  We will get some labs today.  If they are abnormal or we need to do something about them, I will call you.  If they are normal, I will send you a message on MyChart (if it is active) or a letter in the mail.  If you don't hear from [place] in 2 weeks, please call the office at the number below.  I have placed an order for your mammogram.  Please call [facility] at [phone] to schedule your appointment within one week.  Call your eye doctor to schedule a follow up appointment.  Please follow-up with me in 1-2 months.  If you have any questions or concerns, please do not hesitate to call the office at [phone].  Best,   Luis Abed, DO

## 2019-08-08 NOTE — Progress Notes (Signed)
SUBJECTIVE:   CHIEF COMPLAINT / HPI:   Type 1 Diabetes Current Regimen: Novolin 21 units every morning, 11 nightly  CBGs: 170-200 in the evening, 70-90 in the mornings  Last A1c: 8.8 on 05/2018  Denies polyuria, polydipsia, hypoglycemia  Last Eye Exam: was in the last year, but reports diabetic eye surgery in May, didn't follow up and needs to call Sees. Dr. Clelia Croft in [place]  Has tried lantus and novolog in the past, but has felt better on 70/30 and is paying out of pocket for it  Lives in [place] but comes here for doctor's visit Was admitted few weeks ago with gastroparesis and intractable vomiting Had endoscopy with biopsy, but hasn't heard results yet States that she is feeling better, was sent home on protonix and reglan  Tobacco Abuse Currently smoking 0.5 ppd Doesn't want to quit now  Leg Pain Bilateral Usually shooting pain down the outside of her legs Sometimes she wakes up with the pain Working a lot she is on her feet so it makes  Mother was diagnosed with Breast cancer at 62   PERTINENT  PMH / PSH: T1DM, Gastroparesis, Tobacco Abuse  OBJECTIVE:   BP 110/66   Pulse 86   Ht 5\' 8"  (1.727 m)   Wt 173 lb (78.5 kg)   LMP 07/28/2019 (Approximate)   SpO2 98%   BMI 26.30 kg/m    Physical Exam:  General: 39 y.o. female in NAD Cardio: RRR no m/r/g Lungs: CTAB, no wheezing, no rhonchi, no crackles, no IWOB on RA Abdomen: Soft, diffusely mildly tender to palpation, non-distended, positive bowel sounds Skin: warm and dry Extremities: No edema, bilateral legs without obvious deformities, redness, rashes, bruising.  Full range of motion at hips, knees, ankles and bilateral lower extremities.  No tenderness to palpation upon length of bilateral legs, specifically over greater trochanter, bilateral IT bands, knees on bilateral joint lines.  Negative Lachman's, anterior drawer's, McMurray's, Clark's bilaterally.  5/5 strength BLE.  Sensation intact in bilateral lower  extremities.  2+ dorsalis pedis pulses bilaterally.  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  Patient reports higher glucose levels in the evening, therefore will adjust morning 70/30 insulin to 23 units.  Patient states that she does not need a prescription for this because she buys it out-of-pocket.  She states that she wants to continue with 70/30, has used Lantus and NovoLog previously and has not liked them.  Encourage patient to follow-up with her ophthalmologist.  Foot exam today within normal limits.  Had patient sign records request for [facility] in [place] for recent gastroparesis admission.  Will obtain BMP and lipid panel today for screening purposes.  Return in 1 to 2 months.  Tobacco abuse Patient is still smoking half pack per day.  Is resistant to quitting at this time.  Continue to encourage cessation.  Bilateral leg pain Nondescript leg pain.  Leg exam and diabetic foot exam within normal limits.  Discussed with patient importance of regular activity.  Will also obtain BMP today to check electrolytes.  Could consider diabetic neuropathy as a cause of her pain.     [place] Levenia Skalicky, DO  [place]

## 2019-08-09 DIAGNOSIS — M79604 Pain in right leg: Secondary | ICD-10-CM | POA: Insufficient documentation

## 2019-08-09 DIAGNOSIS — M79605 Pain in left leg: Secondary | ICD-10-CM | POA: Insufficient documentation

## 2019-08-09 LAB — LIPID PANEL
Chol/HDL Ratio: 3.8 ratio (ref 0.0–4.4)
Cholesterol, Total: 204 mg/dL — ABNORMAL HIGH (ref 100–199)
HDL: 54 mg/dL (ref >39)
LDL Chol Calc (NIH): 136 mg/dL — ABNORMAL HIGH (ref 0–99)
Triglycerides: 77 mg/dL (ref 0–149)
VLDL Cholesterol Cal: 14 mg/dL (ref 5–40)

## 2019-08-09 LAB — BASIC METABOLIC PANEL WITH GFR
BUN/Creatinine Ratio: 4 — ABNORMAL LOW (ref 9–23)
BUN: 4 mg/dL — ABNORMAL LOW (ref 6–20)
CO2: 21 mmol/L (ref 20–29)
Calcium: 9 mg/dL (ref 8.7–10.2)
Chloride: 104 mmol/L (ref 96–106)
Creatinine, Ser: 1.05 mg/dL — ABNORMAL HIGH (ref 0.57–1.00)
GFR calc Af Amer: 78 mL/min/{1.73_m2}
GFR calc non Af Amer: 68 mL/min/{1.73_m2}
Glucose: 96 mg/dL (ref 65–99)
Potassium: 4.3 mmol/L (ref 3.5–5.2)
Sodium: 140 mmol/L (ref 134–144)

## 2019-08-09 NOTE — Assessment & Plan Note (Signed)
Nondescript leg pain.  Leg exam and diabetic foot exam within normal limits.  Discussed with patient importance of regular activity.  Will also obtain BMP today to check electrolytes.  Could consider diabetic neuropathy as a cause of her pain.

## 2019-08-09 NOTE — Assessment & Plan Note (Signed)
Patient is still smoking half pack per day.  Is resistant to quitting at this time.  Continue to encourage cessation.

## 2019-08-09 NOTE — Assessment & Plan Note (Addendum)
A1c today 8.6.  Patient reports higher glucose levels in the evening, therefore will adjust morning 70/30 insulin to 23 units.  Patient states that she does not need a prescription for this because she buys it out-of-pocket.  She states that she wants to continue with 70/30, has used Lantus and NovoLog previously and has not liked them.  Encourage patient to follow-up with her ophthalmologist.  Foot exam today within normal limits.  Had patient sign records request for [facility] in [place] for recent gastroparesis admission.  Will obtain BMP and lipid panel today for screening purposes.  Return in 1 to 2 months.

## 2019-10-15 ENCOUNTER — Other Ambulatory Visit: Payer: Self-pay | Admitting: Family Medicine

## 2019-10-15 NOTE — Progress Notes (Signed)
Documentation only report  Records obtained from [facility] from patient's hospitalization in [place] from 6/26-6/30 She was admitted for intractable nausea and vomiting in the setting of type 1 diabetes and gastroparesis She was treated with IV Protonix 40 mg twice daily and antinausea medications A1c was 9.1 She had a negative H. pylori IgG GI was consulted and performed EGD which showed moderately severe esophagitis without evidence of bleed and mild gastritis Biopsies showed unremarkable gastric mucosa Discharge summary and EGD report with biopsy results will be scanned  Luis Abed, D.O.  PGY-3 Family Medicine  10/15/2019 8:34 AM

## 2020-03-24 ENCOUNTER — Ambulatory Visit (INDEPENDENT_AMBULATORY_CARE_PROVIDER_SITE_OTHER): Payer: Medicaid Other | Admitting: Student in an Organized Health Care Education/Training Program

## 2020-03-24 ENCOUNTER — Other Ambulatory Visit: Payer: Self-pay

## 2020-03-24 DIAGNOSIS — M67431 Ganglion, right wrist: Secondary | ICD-10-CM | POA: Diagnosis not present

## 2020-03-24 DIAGNOSIS — L819 Disorder of pigmentation, unspecified: Secondary | ICD-10-CM | POA: Diagnosis not present

## 2020-03-24 DIAGNOSIS — B009 Herpesviral infection, unspecified: Secondary | ICD-10-CM | POA: Diagnosis not present

## 2020-03-24 MED ORDER — VALACYCLOVIR HCL 1 G PO TABS: 1000.0000 mg | ORAL_TABLET | Freq: Two times a day (BID) | ORAL | 0 refills | Status: DC

## 2020-03-24 MED ORDER — BENZOYL PEROXIDE 2.5 % EX GEL: CUTANEOUS | 0 refills | Status: DC

## 2020-03-24 NOTE — Patient Instructions (Signed)
It was a pleasure to see you today!  To summarize our discussion for this visit:  For your ganglion cyst- you can try aspercreme, voltaren gel and a heating pad to help reduce the swelling. If you find that you are not seeing improvement in a couple weeks, you can let us know and we can refer to wrist surgeon if needed at that time.  I will refill your valtrex today.  For your acne, I recommend using benzoyl peroxide and/or salicylic acid face wash and body washes   Some additional health maintenance measures we should update are: Health Maintenance Due  Topic Date Due  . OPHTHALMOLOGY EXAM  04/29/2016  . PAP SMEAR-Modifier  05/07/2017  . COVID-19 Vaccine (3 - Pfizer risk 4-dose series) 06/06/2019  . URINE MICROALBUMIN  06/27/2019  . INFLUENZA VACCINE  08/31/2019  . HEMOGLOBIN A1C  02/08/2020  .    Call the clinic at [phone] if your symptoms worsen or you have any concerns.   Thank you for allowing me to take part in your care,  Dr. Jamelle Rushing

## 2020-03-24 NOTE — Progress Notes (Signed)
   SUBJECTIVE:   CHIEF COMPLAINT / HPI: ganglion cyst  Cyst- present for about a week and a half. Was initially larger and has shrunk spontaneously. Patient has used a compressive glove. Denies any recent injury. She uses her hands for typing and other fine motor activities at work frequently. The cyst is causing her pain and weakness. No erythema or rash. Never had this before.   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  OBJECTIVE:   BP 115/80   Pulse 87   Ht 5\' 8"  (1.727 m)   Wt 161 lb 9.6 oz (73.3 kg)   SpO2 100%   BMI 24.57 kg/m   Physical Exam: Gen: NAD, comfortable in exam room R wrist Observation: positive for approximately 1cm swelling at medial dorsal aspect of R carpal bones. transluminates Palpation: soft, mobile. Tender to palpation ROM: wrist ROM intact but painful Strength: decreased flexion at wrist compared to left Sensation: intact Special tests: negative tinel's Skin: patient has mild acne at hairline of forehead and at intermammary cleft which is hyperpigmented, dry, no drainage or erythema. Patient has put a white powder on her breasts.  Performed POC examwith Dr. [place] and observed lobulated cyst approximately <1cm diameter total fluid  ASSESSMENT/PLAN:   Ganglion cyst of dorsum of right wrist Likely related to high use of hands at work. Per patient, has already started to shrink. Per Dr. Jennette Kettle, the cyst seen on imaging is lobulated and too small to get significant benefit from aspiration or injection at this time. She recommended conservative therapy of topical aspercreme or voltaren gel and continue compression glove.  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 She requested a month long antibiotic that she thinks she may have got here previously but cannot remember what it was and may have gotten at a doctor in [place], actually. On chart review, the only related skin disorder I found within recent years was scabies so I asked the patient if that was what she was referring to and she said that was not it. I recommended topical treatments such as benzoyl peroxide or salicylic acid and prescribed benzoyl peroxide.  Patient was angered at this suggestion and said that it would not work for her. She wants the treatment she got previously but was unable to give me any further detail on the treatment, who or where it ws prescribed from, what the diagnosis was.  She was unhappy with the prescribed treatment and asked to speak with my supervisor.     [place], DO [facility]

## 2020-03-25 DIAGNOSIS — L819 Disorder of pigmentation, unspecified: Secondary | ICD-10-CM | POA: Insufficient documentation

## 2020-03-25 DIAGNOSIS — B009 Herpesviral infection, unspecified: Secondary | ICD-10-CM | POA: Insufficient documentation

## 2020-03-25 DIAGNOSIS — M67431 Ganglion, right wrist: Secondary | ICD-10-CM | POA: Insufficient documentation

## 2020-03-25 NOTE — Assessment & Plan Note (Signed)
Patient has few scattered hyperpigmented lesions on hairline and intermammary cleft which appear to be healing acne. She requested a month long antibiotic that she thinks she may have got here previously but cannot remember what it was and may have gotten at a doctor in [place], actually. On chart review, the only related skin disorder I found within recent years was scabies so I asked the patient if that was what she was referring to and she said that was not it. I recommended topical treatments such as benzoyl peroxide or salicylic acid and prescribed benzoyl peroxide.  Patient was angered at this suggestion and said that it would not work for her. She wants the treatment she got previously but was unable to give me any further detail on the treatment, who or where it ws prescribed from, what the diagnosis was.  She was unhappy with the prescribed treatment and asked to speak with my supervisor.

## 2020-03-25 NOTE — Assessment & Plan Note (Signed)
Patient endorses current outbreak and requests refill of her valtrex - sent to pharmacy

## 2020-03-25 NOTE — Assessment & Plan Note (Signed)
Likely related to high use of hands at work. Per patient, has already started to shrink. Per Dr. Jennette Kettle, the cyst seen on imaging is lobulated and too small to get significant benefit from aspiration or injection at this time. She recommended conservative therapy of topical aspercreme or voltaren gel and continue compression glove.  - return if not resolving and can consider referral to hand surgeon or formal imaging such as MRI wrist

## 2020-03-29 ENCOUNTER — Other Ambulatory Visit: Payer: Self-pay | Admitting: Family Medicine

## 2020-03-29 ENCOUNTER — Telehealth: Payer: Self-pay

## 2020-03-29 DIAGNOSIS — E103599 Type 1 diabetes mellitus with proliferative diabetic retinopathy without macular edema, unspecified eye: Secondary | ICD-10-CM

## 2020-03-29 MED ORDER — "INSULIN SYRINGE-NEEDLE U-100 31G X 15/64"" 0.3 ML MISC": 1.0000 | Freq: Four times a day (QID) | 1 refills | Status: DC

## 2020-03-29 NOTE — Progress Notes (Signed)
Refill insulin needles

## 2020-03-29 NOTE — Telephone Encounter (Signed)
Rx sent 

## 2020-03-29 NOTE — Telephone Encounter (Signed)
Fax from [facility] pharmacy asking for a refill on the following. Did not see on med list:  BD INS SYR 0.3/31G/23mm MIS QTY: 100  Sunday Spillers, CMA

## 2020-08-16 ENCOUNTER — Ambulatory Visit: Payer: Medicaid Other

## 2020-08-18 ENCOUNTER — Other Ambulatory Visit: Payer: Self-pay

## 2020-08-18 ENCOUNTER — Ambulatory Visit (INDEPENDENT_AMBULATORY_CARE_PROVIDER_SITE_OTHER): Payer: Medicaid Other

## 2020-08-18 DIAGNOSIS — Z111 Encounter for screening for respiratory tuberculosis: Secondary | ICD-10-CM

## 2020-08-18 NOTE — Progress Notes (Signed)
Patient is here for a PPD placement.  PPD placed in left forearm @ 3:20 pm.  Patient will return 08/20/2020 to have PPD read.   Veronda Prude, RN

## 2020-08-20 ENCOUNTER — Ambulatory Visit (INDEPENDENT_AMBULATORY_CARE_PROVIDER_SITE_OTHER): Payer: Medicaid Other

## 2020-08-20 ENCOUNTER — Other Ambulatory Visit: Payer: Self-pay

## 2020-08-20 DIAGNOSIS — Z111 Encounter for screening for respiratory tuberculosis: Secondary | ICD-10-CM

## 2020-08-20 LAB — TB SKIN TEST
Induration: 0 mm
TB Skin Test: NEGATIVE

## 2020-08-23 NOTE — Progress Notes (Signed)
Patient is here for a PPD read.  It was placed on 08/18/2020 in the left forearm @ 3:20 pm.    PPD RESULTS:  Result: negative Induration: 0 mm  Letter created and given to patient for documentation purposes. Veronda Prude, RN

## 2020-09-09 ENCOUNTER — Other Ambulatory Visit: Payer: Self-pay | Admitting: Family Medicine

## 2020-09-09 DIAGNOSIS — E103599 Type 1 diabetes mellitus with proliferative diabetic retinopathy without macular edema, unspecified eye: Secondary | ICD-10-CM

## 2020-09-13 MED ORDER — ACCU-CHEK GUIDE VI STRP: ORAL_STRIP | 12 refills | Status: DC

## 2020-11-29 ENCOUNTER — Other Ambulatory Visit: Payer: Self-pay

## 2020-11-29 ENCOUNTER — Encounter (HOSPITAL_COMMUNITY): Payer: Self-pay | Admitting: Emergency Medicine

## 2020-11-29 ENCOUNTER — Emergency Department (HOSPITAL_COMMUNITY)
Admission: EM | Admit: 2020-11-29 | Discharge: 2020-11-30 | Disposition: A | Discharge status: Home / Self Care | Payer: Medicaid Other | Attending: Emergency Medicine | Admitting: Emergency Medicine

## 2020-11-29 DIAGNOSIS — E109 Type 1 diabetes mellitus without complications: Secondary | ICD-10-CM | POA: Diagnosis not present

## 2020-11-29 DIAGNOSIS — W010XXA Fall on same level from slipping, tripping and stumbling without subsequent striking against object, initial encounter: Secondary | ICD-10-CM | POA: Insufficient documentation

## 2020-11-29 DIAGNOSIS — F17210 Nicotine dependence, cigarettes, uncomplicated: Secondary | ICD-10-CM | POA: Diagnosis not present

## 2020-11-29 DIAGNOSIS — R000 Tachycardia, unspecified: Secondary | ICD-10-CM | POA: Insufficient documentation

## 2020-11-29 DIAGNOSIS — Z794 Long term (current) use of insulin: Secondary | ICD-10-CM | POA: Insufficient documentation

## 2020-11-29 DIAGNOSIS — N9489 Other specified conditions associated with female genital organs and menstrual cycle: Secondary | ICD-10-CM | POA: Insufficient documentation

## 2020-11-29 DIAGNOSIS — W19XXXA Unspecified fall, initial encounter: Secondary | ICD-10-CM

## 2020-11-29 DIAGNOSIS — M25562 Pain in left knee: Secondary | ICD-10-CM | POA: Diagnosis present

## 2020-11-29 DIAGNOSIS — I10 Essential (primary) hypertension: Secondary | ICD-10-CM | POA: Insufficient documentation

## 2020-11-29 LAB — CBG MONITORING, ED: Glucose-Capillary: 335 mg/dL — ABNORMAL HIGH (ref 70–99)

## 2020-11-29 MED ORDER — OXYCODONE-ACETAMINOPHEN 5-325 MG PO TABS
1.0000 | ORAL_TABLET | Freq: Once | ORAL | Status: AC
  Administered 2020-11-29: 1 via ORAL
  Filled 2020-11-29: qty 1

## 2020-11-29 NOTE — ED Triage Notes (Signed)
Patient fell and landed on left knee.  Patient now with increased pain in knee and now having a hard time to straighten it.  Swelling noted to left knee.

## 2020-11-29 NOTE — ED Provider Notes (Signed)
Emergency Medicine Provider Triage Evaluation Note  [name] , a 40 y.o. female  was evaluated in triage.  Pt complains of fall.  The patient fell from standing earlier today.  She fell onto her left knee.  She is concerned that she may have "popped it out of place".  She states that she did not hit her head and there was no loss of consciousness.  She does not take blood thinners.  She was initially able to stand on the leg, but now reports that she has sharp shooting pain in her left knee that radiates down into her lower leg.  She feels as if her leg is locked.  No hip or ankle pain.  No calf swelling, numbness, or weakness.  Review of Systems  Positive: Myalgias, arthralgias, gait problem Negative: Wounds, leg swelling, syncope, headache  Physical Exam  There were no vitals taken for this visit. Gen:   Awake, no distress   Resp:  Normal effort  MSK:   Moves extremities without difficulty  Other:  Diffusely tender to palpation to the knee with associated swelling.  No obvious deformities.  Calf is soft and nontender.  She has neurovascular intact to the bilateral lower extremities.  Left ankle and hip are nontender.  Medical Decision Making  Medically screening exam initiated at 11:26 PM.  Appropriate orders placed.  [name] was informed that the remainder of the evaluation will be completed by another provider, this initial triage assessment does not replace that evaluation, and the importance of remaining in the ED until their evaluation is complete.  Labs and imaging have been ordered.  She will require further work-up and evaluation in the emergency department.   Barkley Boards, PA-C 11/29/20 [id number]    Shon Baton, MD 11/30/20 [phone]

## 2020-11-30 ENCOUNTER — Emergency Department (HOSPITAL_COMMUNITY): Payer: Medicaid Other

## 2020-11-30 LAB — I-STAT VENOUS BLOOD GAS, ED
Acid-Base Excess: 5 mmol/L — ABNORMAL HIGH (ref 0.0–2.0)
Bicarbonate: 27.8 mmol/L (ref 20.0–28.0)
Calcium, Ion: 1.05 mmol/L — ABNORMAL LOW (ref 1.15–1.40)
HCT: 37 % (ref 36.0–46.0)
Hemoglobin: 12.6 g/dL (ref 12.0–15.0)
O2 Saturation: 100 %
Potassium: 4.7 mmol/L (ref 3.5–5.1)
Sodium: 129 mmol/L — ABNORMAL LOW (ref 135–145)
TCO2: 29 mmol/L (ref 22–32)
pCO2, Ven: 33.7 mmHg — ABNORMAL LOW (ref 44.0–60.0)
pH, Ven: 7.525 — ABNORMAL HIGH (ref 7.250–7.430)
pO2, Ven: 205 mmHg — ABNORMAL HIGH (ref 32.0–45.0)

## 2020-11-30 LAB — CBG MONITORING, ED: Glucose-Capillary: 287 mg/dL — ABNORMAL HIGH (ref 70–99)

## 2020-11-30 LAB — I-STAT BETA HCG BLOOD, ED (MC, WL, AP ONLY): I-stat hCG, quantitative: <5 m[IU]/mL (ref <5)

## 2020-11-30 MED ORDER — KETOROLAC TROMETHAMINE 60 MG/2ML IM SOLN
30.0000 mg | Freq: Once | INTRAMUSCULAR | Status: AC
  Administered 2020-11-30: 30 mg via INTRAMUSCULAR
  Filled 2020-11-30: qty 2

## 2020-11-30 MED ORDER — IBUPROFEN 600 MG PO TABS: 600.0000 mg | ORAL_TABLET | Freq: Four times a day (QID) | ORAL | 0 refills | Status: DC | PRN

## 2020-11-30 MED ORDER — HYDROCODONE-ACETAMINOPHEN 5-325 MG PO TABS
1.0000 | ORAL_TABLET | Freq: Once | ORAL | Status: AC
  Administered 2020-11-30: 1 via ORAL
  Filled 2020-11-30: qty 1

## 2020-11-30 NOTE — ED Notes (Signed)
Given applesauce 

## 2020-11-30 NOTE — Discharge Instructions (Addendum)
You seen here today for evaluation of your left knee pain after a fall.  You been given ibuprofen 600 mg you can take every 6 hours as needed for pain.  Additionally, you have been referred to a sports medicine physician for evaluation of your knee.  Please call office to schedule an appointment. Please follow-up with your PCP for your high blood sugar readings.  If you have any concern, new or worsening symptoms, please return to the nearest emergency department.

## 2020-11-30 NOTE — ED Notes (Signed)
Called ortho to apply knee brace.

## 2020-11-30 NOTE — ED Provider Notes (Signed)
[facility] EMERGENCY DEPARTMENT Provider Note   CSN: [id number] Arrival date & time: 11/29/20  2240     History Chief Complaint  Patient presents with   [name] is a 40 y.o. female presents to the ED for evaluation of left knee pain after tripping and falling on it yesterday.  Patient reports she was walking around her room tripped over something and landed on her left knee.  She reports it "popped out of place" before she fell.  Denies any numbness or tingling.  Reports that she was able to walk on it for the first few hours going to her friend's house, but now she feels like it is "locked up".Denies any blood thinner use.  Denies any LOC or head injury.  Notable history includes type 1 diabetes and hypertension.  No orthopedic surgeries on that knee.  Allergic to OCP.  Daily smoker.   Fall Pertinent negatives include no chest pain, no abdominal pain, no headaches and no shortness of breath.      Past Medical History:  Diagnosis Date   Arthritis    "legs and hands" (08/21/2012)   Chronic bronchitis ([place])    "get it q yr" (08/21/2012)   Exertional shortness of breath    GERD (gastroesophageal reflux disease)    Headache(784.0)    "weekly" (08/21/2012)   Hyperlipidemia    Hypertension    Incarceration    Was in jail for selling drugs - out Oct 2010.   Renal disorder    Tobacco abuse    Type I diabetes mellitus (HCC)    A1c > 11. Patient does not check BG or take insulin regularly.  Dx at age 29.    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   Acute cystitis without hematuria 03/12/2017   Abdominal pain    Intractable nausea and vomiting 02/08/2017   Marijuana abuse    Mixed hyperlipidemia    Vaginal discharge 11/09/2016    Hair loss 11/09/2016   Chronic left shoulder pain 08/14/2016   Left elbow pain 07/10/2016   DKA (diabetic ketoacidoses) 06/27/2016   DKA, type 1 ([place]) 06/26/2016   Otomycosis of left ear 09/15/2015   Mild nonproliferative diabetic retinopathy ([place]) 07/25/2013   Intractable cyclical vomiting with nausea 07/24/2013   Contraception 04/16/2013   Unspecified constipation 08/28/2012   Anemia 04/19/2012   Essential hypertension, benign 04/19/2012   Gastroparesis 03/11/2012   TRIGGER FINGER, RIGHT THUMB 05/24/2009   Tobacco abuse 11/07/2008   Type 1 diabetes mellitus with proliferative retinopathy ([place]) 04/02/2006   HYPERCHOLESTEROLEMIA 03/29/2006    Past Surgical History:  Procedure Laterality Date   TUBAL LIGATION  2009     OB History   No obstetric history on file.     History reviewed. No pertinent family history.  Social History   Tobacco Use   Smoking status: Every Day    Packs/day: 0.33    Years: 14.00    Pack years: 4.62    Types: Cigarettes   Smokeless tobacco: Never  Substance Use Topics   Alcohol use: Yes    Comment: 08/21/2012 "might drink 1-2 glasses of liquor once/month"   Drug use: Yes    Types: Marijuana    Home Medications Prior to Admission medications  Medication Sig Start Date End Date Taking? Authorizing Provider  ibuprofen (ADVIL) 600 MG tablet Take 1 tablet (600 mg total) by mouth every 6 (six) hours as needed. 11/30/20  Yes Achille Rich, PA-C  BD VEO INSULIN SYRINGE U/F 31G X 15/64" 0.3 ML MISC USE 1 SYRINGE 4 TIMES DAILY IN THE MORNING, AT NOON, IN THE EVENING, AND AT BEDTIME 09/13/20   Maury Dus, MD  Benzoyl Peroxide 2.5 % gel Use daily on skin for breakouts and prevention 03/24/20   Leeroy Bock, MD  cephALEXin (KEFLEX) 500 MG capsule Take 1 capsule (500 mg total) by mouth 4 (four) times daily. 03/12/17   Almon Hercules, MD  clindamycin (CLEOCIN) 300 MG capsule Take 1 capsule (300 mg total) by mouth 2 (two) times daily. 05/29/17    Casey Burkitt, MD  clotrimazole (LOTRIMIN) 1 % external solution Apply 2-3 drops of solution, two or three times daily. Continue to use the drops for at least 14 days after your symptoms have gone. 05/29/17   Casey Burkitt, MD  fluconazole (DIFLUCAN) 150 MG tablet Take 1 tablet (150 mg total) by mouth daily. May repeat in 72 hours if no improvement 01/10/19   Allayne Stack, DO  glucose blood (ACCU-CHEK GUIDE) test strip Use to test blood sugar 2x per day 09/13/20   Maury Dus, MD  insulin NPH-regular Human (NOVOLIN 70/30) (70-30) 100 UNIT/ML injection Inject 23 Units into the skin 2 (two) times daily with a meal. 23 Units in the am and 15-20 units in pm. Patient taking differently: Inject 23 Units into the skin 2 (two) times daily with a meal. 23-25 in the morning Units in the am and 15-20 units in pm. 09/20/16   Shon Hale, MD  metoCLOPramide (REGLAN) 10 MG tablet Take 1 tablet (10 mg total) by mouth every 6 (six) hours as needed for nausea. 02/10/17   Arvilla Market, MD  metroNIDAZOLE (FLAGYL) 500 MG tablet Take 1 tablet (500 mg total) by mouth 2 (two) times daily. 07/23/19   Arlyce Harman, MD  ondansetron (ZOFRAN) 4 MG tablet Take 1 tablet (4 mg total) by mouth every 6 (six) hours. 02/05/17   Hedges, Tinnie Gens, PA-C  pantoprazole (PROTONIX) 40 MG tablet Take 1 tablet (40 mg total) by mouth daily. 02/10/17   Arvilla Market, MD  polyethylene glycol [facility] Ethelene Hal) packet Take 17 g by mouth daily as needed for mild constipation. 02/10/17   Arvilla Market, MD  valACYclovir (VALTREX) 1000 MG tablet Take 1 tablet (1,000 mg total) by mouth 2 (two) times daily. 03/24/20   Leeroy Bock, MD    Allergies    Levonorgestrel-ethinyl estrad  Review of Systems   Review of Systems  Constitutional:  Negative for chills and fever.  HENT:  Negative for ear pain and sore throat.   Eyes:  Negative for pain and visual disturbance.   Respiratory:  Negative for cough and shortness of breath.   Cardiovascular:  Negative for chest pain and palpitations.  Gastrointestinal:  Negative for abdominal pain and vomiting.  Genitourinary:  Negative for dysuria and hematuria.  Musculoskeletal:  Positive for arthralgias. Negative for back pain and joint swelling.  Skin:  Negative for color change and rash.  Neurological:  Negative for dizziness, seizures, syncope, weakness, light-headedness, numbness and headaches.  All other systems reviewed and are negative.  Physical Exam Updated Vital Signs BP (!) 153/85   Pulse (!) 108   Temp 98.2 F (36.8 C)  Resp 20   LMP 11/17/2020 (Approximate)   SpO2 100%   Physical Exam Vitals and nursing note reviewed.  Constitutional:      General: She is not in acute distress.    Appearance: Normal appearance. She is not toxic-appearing.  HENT:     Head: Normocephalic and atraumatic.  Eyes:     General: No scleral icterus. Cardiovascular:     Rate and Rhythm: Regular rhythm. Tachycardia present.  Pulmonary:     Effort: Pulmonary effort is normal. No respiratory distress.  Abdominal:     General: Abdomen is flat.  Musculoskeletal:        General: Tenderness present. No swelling or deformity.     Cervical back: Normal range of motion.     Right lower leg: No edema.     Left lower leg: No edema.     Comments: No deformity. No overlying skin changes, erythema, ecchymosis, or rash noted to the area.  No swelling noted.  Negative anterior and posterior drawer test. Compartments soft. Pulses intact.  Sensation intact.   Skin:    General: Skin is warm and dry.     Findings: No bruising or rash.  Neurological:     General: No focal deficit present.     Mental Status: She is alert. Mental status is at baseline.     Sensory: No sensory deficit.     Motor: No weakness.     Gait: Gait abnormal.     Comments: Reports he is unable to weight-bear on the leg.  Favoring her right side.  Was  given knee brace and crutches.  Patient is able to ambulate with these.  Psychiatric:        Mood and Affect: Mood normal.    ED Results / Procedures / Treatments   Labs (all labs ordered are listed, but only abnormal results are displayed) Labs Reviewed  CBG MONITORING, ED - Abnormal; Notable for the following components:      Result Value   Glucose-Capillary 335 (*)    All other components within normal limits  CBG MONITORING, ED - Abnormal; Notable for the following components:   Glucose-Capillary 287 (*)    All other components within normal limits    EKG None  Radiology DG Knee Complete 4 Views Left  Result Date: 11/30/2020 CLINICAL DATA:  Fall with left knee pain EXAM: LEFT KNEE - COMPLETE 4+ VIEW COMPARISON:  None. FINDINGS: No evidence of fracture, dislocation, or joint effusion. No evidence of arthropathy or other focal bone abnormality. Soft tissues are unremarkable. IMPRESSION: Negative. Electronically Signed   By: Ulyses Jarred M.D.   On: 11/30/2020 02:58    Procedures Procedures   Medications Ordered in ED Medications  oxyCODONE-acetaminophen (PERCOCET/ROXICET) 5-325 MG per tablet 1 tablet (1 tablet Oral Given 11/29/20 2354)  HYDROcodone-acetaminophen (NORCO/VICODIN) 5-325 MG per tablet 1 tablet (1 tablet Oral Given 11/30/20 1058)    ED Course  I have reviewed the triage vital signs and the nursing notes.  Pertinent labs & imaging results that were available during my care of the patient were reviewed by me and considered in my medical decision making (see chart for details).  Patient with mechanical fall presents for left knee pain.  I personally reviewed the patient's x-ray.  Soft tissue unremarkable.  No evidence of focal bone abnormality.  No deformity to knee.  Anterior and posterior drawer test.  Compartments soft. Patient reports she is unable to weight bear due to pain. Will provide patient with ibuprofen  600 mg every 6 hours as needed as well as a soft  knee brace/crutches and follow-up with sports medicine. Toradol given in ED. Return precautions given. Recommended follow up with her PCP for her elevated glucose levels. Patient is stable being discharged home in good condition.    MDM Rules/Calculators/A&P                          Final Clinical Impression(s) / ED Diagnoses Final diagnoses:  Fall, initial encounter  Acute pain of left knee    Rx / DC Orders ED Discharge Orders          Ordered    ibuprofen (ADVIL) 600 MG tablet  Every 6 hours PRN        11/30/20 1125             Sherrell Puller, PA-C 11/30/20 1255    Godfrey Pick, MD 12/01/20 [phone]

## 2021-01-10 NOTE — Progress Notes (Signed)
SUBJECTIVE:   CHIEF COMPLAINT / HPI:   Vaginal irritation: Patient is a 40 y.o. female presenting with vaginal irritation for two or three weeks.  No discharge.  She is interested in screening for sexually transmitted infections today. Her period came late by about a week and lasted a few days longer than usual and since then she had some increased irritation.   Encounter for Pap smear: Patient's last Pap smear was in 2016 and was normal with no high risk HPV and recommended follow-up in 5 years.  She is due for this today.  Type I diabetes Patient endorses no problems.  She follows with endocrinology and has an appointment scheduled with them later this week.  Home medications include: Insulin 70/30 24u in morning and 17u in evening.  Patient endorses taking these medications as prescribed.  Most recent A1Cs:  Lab Results  Component Value Date   HGBA1C 10.3 (A) 01/11/2021   HGBA1C 8.6 (A) 08/08/2019   HGBA1C 8.8 (H) 06/27/2018   Last Microalbumin, LDL, Creatinine: Lab Results  Component Value Date   MICROALBUR 10 05/29/2017   LDLCALC 136 (H) 08/08/2019   CREATININE 1.05 (H) 08/08/2019   Patient does check blood glucose on a regular basis. It was 163 this morning fasting.  Patient is not up to date on diabetic eye. Patient is not up to date on diabetic foot exam.   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  Previously 8.6.  Continues on 70/30 insulin and does follow with endocrinology.  She has an appointment with them later this week.  Recommended initiating a statin.  We will perform diabetic foot exam and urine microalbumin at next appointment  Hyperlipidemia Previous LDL of 136.  Patient not on a statin.  History of type 2 diabetes.  We will plan to recheck direct LDL today.  Recommended initiating a statin.  We will initiate atorvastatin 40 mg    Vaginal irritation/discharge: 40 y.o. female with vaginal itching/irritation for about 2 weeks.  On physical exam she has copious white discharge present.  She is interested in STD screening.  Wet prep performed which showed many bacteria but no signs of trichomonas, yeast infection, negative for BV, it did however show elevated numbers of bacteria.  Based off this and the amount of discharge present I have concern for chlamydia versus gonorrhea. Plan: -Wet prep as above.  -GC/chlamydia pending, however due to the extent of the discharge will empirically treat for gonorrhea and chlamydia.  0.5 g ceftriaxone injection given in clinic today.  Will send in prescription for doxycycline 100 mg twice daily for 7 days.  Discussed with her recommend that she avoid sexual intercourse for the next 7 days and also have any sexual partners reach out through with their primary care physician for treatment. -Will check HIV and RPR  Pap smear performed.  Jackelyn Poling, DO [facility]

## 2021-01-11 ENCOUNTER — Encounter: Payer: Self-pay | Admitting: Family Medicine

## 2021-01-11 ENCOUNTER — Ambulatory Visit (INDEPENDENT_AMBULATORY_CARE_PROVIDER_SITE_OTHER): Payer: Medicaid Other

## 2021-01-11 ENCOUNTER — Other Ambulatory Visit (HOSPITAL_COMMUNITY)
Admission: RE | Admit: 2021-01-11 | Discharge: 2021-01-11 | Disposition: A | Discharge status: Home / Self Care | Payer: Medicaid Other | Source: Ambulatory Visit | Attending: Family Medicine | Admitting: Family Medicine

## 2021-01-11 ENCOUNTER — Other Ambulatory Visit (HOSPITAL_COMMUNITY): Payer: Self-pay

## 2021-01-11 ENCOUNTER — Ambulatory Visit (INDEPENDENT_AMBULATORY_CARE_PROVIDER_SITE_OTHER): Payer: Medicaid Other | Admitting: Family Medicine

## 2021-01-11 ENCOUNTER — Other Ambulatory Visit: Payer: Self-pay

## 2021-01-11 VITALS — BP 140/76 | HR 94 | Ht 68.0 in | Wt 163.6 lb

## 2021-01-11 DIAGNOSIS — Z113 Encounter for screening for infections with a predominantly sexual mode of transmission: Secondary | ICD-10-CM | POA: Diagnosis not present

## 2021-01-11 DIAGNOSIS — Z124 Encounter for screening for malignant neoplasm of cervix: Secondary | ICD-10-CM | POA: Diagnosis present

## 2021-01-11 DIAGNOSIS — Z23 Encounter for immunization: Secondary | ICD-10-CM

## 2021-01-11 DIAGNOSIS — E785 Hyperlipidemia, unspecified: Secondary | ICD-10-CM

## 2021-01-11 DIAGNOSIS — N898 Other specified noninflammatory disorders of vagina: Secondary | ICD-10-CM | POA: Diagnosis present

## 2021-01-11 DIAGNOSIS — E103599 Type 1 diabetes mellitus with proliferative diabetic retinopathy without macular edema, unspecified eye: Secondary | ICD-10-CM

## 2021-01-11 LAB — POCT GLYCOSYLATED HEMOGLOBIN (HGB A1C): HbA1c, POC (controlled diabetic range): 10.3 % — AB (ref 0.0–7.0)

## 2021-01-11 LAB — POCT WET PREP (WET MOUNT)
Clue Cells Wet Prep Whiff POC: NEGATIVE
Trichomonas Wet Prep HPF POC: ABSENT

## 2021-01-11 MED ORDER — DOXYCYCLINE HYCLATE 100 MG PO TABS
100.0000 mg | ORAL_TABLET | Freq: Two times a day (BID) | ORAL | 0 refills | Status: DC
  Filled 2021-01-11: qty 14, 7d supply, fill #0

## 2021-01-11 MED ORDER — CEFTRIAXONE SODIUM 1 G IJ SOLR
0.5000 g | Freq: Once | INTRAMUSCULAR | Status: AC
  Administered 2021-01-11: 0.5 g via INTRAMUSCULAR

## 2021-01-11 MED ORDER — ATORVASTATIN CALCIUM 40 MG PO TABS
40.0000 mg | ORAL_TABLET | Freq: Every day | ORAL | 3 refills | Status: DC
  Filled 2021-01-11: qty 90, 90d supply, fill #0

## 2021-01-11 NOTE — Assessment & Plan Note (Signed)
Previous LDL of 136.  Patient not on a statin.  History of type 2 diabetes.  We will plan to recheck direct LDL today.  Recommended initiating a statin.  We will initiate atorvastatin 40 mg

## 2021-01-11 NOTE — Assessment & Plan Note (Signed)
A1c today of 10.3.  Previously 8.6.  Continues on 70/30 insulin and does follow with endocrinology.  She has an appointment with them later this week.  Recommended initiating a statin.  We will perform diabetic foot exam and urine microalbumin at next appointment

## 2021-01-11 NOTE — Patient Instructions (Addendum)
Today we checked your A1c, it was 10.3, please continue to follow-up with your endocrinologist later this week.  We also performed your Pap smear.  We performed a wet prep that was negative for yeast, BV, trichomonas.  We are also doing STD testing and I will call you with results when they return.  I like see back in about a week to address some of the other recommended items for your health.  We are starting a statin medication which is a medication to help your cholesterol level.  We are going to check your cholesterol level today and have sent that medication to your pharmacy.  We are empirically treating for gonorrhea and chlamydia.  I sent in prescription for chlamydia to your pharmacy.  You will take this for the next 7 days.  For gonorrhea were provided an injection today.  I recommend that you avoid sexual intercourse for the next 7 days and have your partners reach out to the primary doctor to get treated for STDs.  I will let you know the results of your testing when it returns.

## 2021-01-12 LAB — HIV ANTIBODY (ROUTINE TESTING W REFLEX)

## 2021-01-12 LAB — LDL CHOLESTEROL, DIRECT: LDL Direct: 149 mg/dL — ABNORMAL HIGH (ref 0–99)

## 2021-01-12 LAB — RPR

## 2021-01-14 LAB — CYTOLOGY - PAP
Comment: NEGATIVE
Diagnosis: NEGATIVE
High risk HPV: NEGATIVE

## 2021-02-11 ENCOUNTER — Other Ambulatory Visit: Payer: Self-pay

## 2021-02-11 ENCOUNTER — Encounter: Payer: Self-pay | Admitting: Family Medicine

## 2021-02-11 ENCOUNTER — Ambulatory Visit (INDEPENDENT_AMBULATORY_CARE_PROVIDER_SITE_OTHER): Payer: Medicaid Other | Admitting: Family Medicine

## 2021-02-11 ENCOUNTER — Other Ambulatory Visit (HOSPITAL_COMMUNITY): Payer: Self-pay

## 2021-02-11 ENCOUNTER — Other Ambulatory Visit (HOSPITAL_BASED_OUTPATIENT_CLINIC_OR_DEPARTMENT_OTHER): Payer: Self-pay

## 2021-02-11 VITALS — BP 117/69 | HR 89 | Ht 68.0 in | Wt 165.1 lb

## 2021-02-11 DIAGNOSIS — R21 Rash and other nonspecific skin eruption: Secondary | ICD-10-CM | POA: Diagnosis not present

## 2021-02-11 MED ORDER — TRIAMCINOLONE ACETONIDE 0.1 % EX CREA
1.0000 "application " | TOPICAL_CREAM | Freq: Two times a day (BID) | CUTANEOUS | 0 refills | Status: DC
  Filled 2021-02-11: qty 30, 15d supply, fill #0

## 2021-02-11 NOTE — Patient Instructions (Addendum)
It was nice seeing you today!  Blood work today.  Use steroid cream as needed for itching.  Recommend using daily moisturizer such as Vaseline or Eucerin.  Schedule visit with dermatology clinic.  Stay well, Littie Deeds, MD [facility] [phone]  --  Make sure to check out at the front desk before you leave today.  Please arrive at least 15 minutes prior to your scheduled appointments.  If you had blood work today, I will send you a MyChart message or a letter if results are normal. Otherwise, I will give you a call.  If you had a referral placed, they will call you to set up an appointment. Please give [place] a call if you don't hear back in the next 2 weeks.  If you need additional refills before your next appointment, please call your pharmacy first.

## 2021-02-11 NOTE — Progress Notes (Signed)
° ° °  SUBJECTIVE:   CHIEF COMPLAINT / HPI: rash  Patient reports diffuse, pruritic rash that has been ongoing for about 1 month.  She has tried benzyl peroxide which she believes made things worse.  She states that she was treated a long time ago for this with prolonged course of antibiotics and is requesting for antibiotics.  She has a history of acne but feels that this is different.  PERTINENT  PMH / PSH: T1DM, HTN, HSV  OBJECTIVE:   BP 117/69    Pulse 89    Ht 5\' 8"  (1.727 m)    Wt 165 lb 2 oz (74.9 kg)    LMP 01/24/2021    SpO2 100%    BMI 25.11 kg/m   General: Alert, NAD CV: Regular rate Pulm: No respiratory distress Derm: Diffuse hyperpigmented maculopapular rash with excoriations throughout her chest, back, face, and bilateral arms  ASSESSMENT/PLAN:   Rash Diffuse hyperpigmented maculopapular rash as outlined above, etiology is unclear.  This has been a recurrent issue but I do not see any definite diagnoses in the chart.  Do not feel that this is due to a bacterial infection.  She did receive a course of doxycycline 1 month ago so could consider drug-related rash.  Differential also includes HIV, syphilis, DPN. - labs: CBC w diff, HIV, RPR - topical triamcinolone - daily moisturizer - advised to schedule with dermatology clinic for possible skin biopsy   01/26/2021, MD [facility]

## 2021-02-12 LAB — CBC WITH DIFFERENTIAL/PLATELET
Basophils Absolute: 0 10*3/uL (ref 0.0–0.2)
Basos: 1 %
EOS (ABSOLUTE): 0.2 10*3/uL (ref 0.0–0.4)
Eos: 4 %
Hematocrit: 38.6 % (ref 34.0–46.6)
Hemoglobin: 12 g/dL (ref 11.1–15.9)
Immature Grans (Abs): 0 10*3/uL (ref 0.0–0.1)
Immature Granulocytes: 0 %
Lymphocytes Absolute: 1.9 10*3/uL (ref 0.7–3.1)
Lymphs: 34 %
MCH: 27.8 pg (ref 26.6–33.0)
MCHC: 31.1 g/dL — ABNORMAL LOW (ref 31.5–35.7)
MCV: 90 fL (ref 79–97)
Monocytes Absolute: 0.4 10*3/uL (ref 0.1–0.9)
Monocytes: 7 %
Neutrophils Absolute: 3.2 10*3/uL (ref 1.4–7.0)
Neutrophils: 54 %
Platelets: 348 10*3/uL (ref 150–450)
RBC: 4.31 x10E6/uL (ref 3.77–5.28)
RDW: 12.8 % (ref 11.7–15.4)
WBC: 5.7 10*3/uL (ref 3.4–10.8)

## 2021-02-12 LAB — HIV ANTIBODY (ROUTINE TESTING W REFLEX): HIV Screen 4th Generation wRfx: NONREACTIVE

## 2021-02-12 LAB — RPR: RPR Ser Ql: NONREACTIVE

## 2021-02-21 ENCOUNTER — Other Ambulatory Visit (HOSPITAL_COMMUNITY): Payer: Self-pay

## 2021-03-05 ENCOUNTER — Other Ambulatory Visit: Payer: Self-pay | Admitting: Family Medicine

## 2021-03-05 DIAGNOSIS — E103599 Type 1 diabetes mellitus with proliferative diabetic retinopathy without macular edema, unspecified eye: Secondary | ICD-10-CM

## 2021-03-17 ENCOUNTER — Inpatient Hospital Stay (HOSPITAL_COMMUNITY)
Admission: EM | Admit: 2021-03-17 | Discharge: 2021-03-21 | DRG: 638 | Attending: Internal Medicine | Admitting: Internal Medicine

## 2021-03-17 ENCOUNTER — Inpatient Hospital Stay (HOSPITAL_COMMUNITY)

## 2021-03-17 ENCOUNTER — Encounter (HOSPITAL_COMMUNITY): Payer: Self-pay

## 2021-03-17 DIAGNOSIS — D72829 Elevated white blood cell count, unspecified: Secondary | ICD-10-CM | POA: Diagnosis present

## 2021-03-17 DIAGNOSIS — K219 Gastro-esophageal reflux disease without esophagitis: Secondary | ICD-10-CM | POA: Diagnosis present

## 2021-03-17 DIAGNOSIS — I10 Essential (primary) hypertension: Secondary | ICD-10-CM | POA: Diagnosis present

## 2021-03-17 DIAGNOSIS — Z79899 Other long term (current) drug therapy: Secondary | ICD-10-CM | POA: Diagnosis not present

## 2021-03-17 DIAGNOSIS — F17210 Nicotine dependence, cigarettes, uncomplicated: Secondary | ICD-10-CM | POA: Diagnosis present

## 2021-03-17 DIAGNOSIS — E1043 Type 1 diabetes mellitus with diabetic autonomic (poly)neuropathy: Secondary | ICD-10-CM | POA: Diagnosis present

## 2021-03-17 DIAGNOSIS — Z794 Long term (current) use of insulin: Secondary | ICD-10-CM | POA: Diagnosis not present

## 2021-03-17 DIAGNOSIS — R112 Nausea with vomiting, unspecified: Secondary | ICD-10-CM

## 2021-03-17 DIAGNOSIS — R197 Diarrhea, unspecified: Secondary | ICD-10-CM | POA: Diagnosis present

## 2021-03-17 DIAGNOSIS — K3184 Gastroparesis: Secondary | ICD-10-CM | POA: Diagnosis present

## 2021-03-17 DIAGNOSIS — N179 Acute kidney failure, unspecified: Secondary | ICD-10-CM | POA: Diagnosis present

## 2021-03-17 DIAGNOSIS — R6510 Systemic inflammatory response syndrome (SIRS) of non-infectious origin without acute organ dysfunction: Secondary | ICD-10-CM | POA: Diagnosis present

## 2021-03-17 DIAGNOSIS — Z20822 Contact with and (suspected) exposure to covid-19: Secondary | ICD-10-CM | POA: Diagnosis present

## 2021-03-17 DIAGNOSIS — E1010 Type 1 diabetes mellitus with ketoacidosis without coma: Secondary | ICD-10-CM | POA: Diagnosis present

## 2021-03-17 DIAGNOSIS — Z888 Allergy status to other drugs, medicaments and biological substances status: Secondary | ICD-10-CM | POA: Diagnosis not present

## 2021-03-17 DIAGNOSIS — E103599 Type 1 diabetes mellitus with proliferative diabetic retinopathy without macular edema, unspecified eye: Secondary | ICD-10-CM | POA: Diagnosis present

## 2021-03-17 DIAGNOSIS — E7800 Pure hypercholesterolemia, unspecified: Secondary | ICD-10-CM | POA: Diagnosis present

## 2021-03-17 DIAGNOSIS — E1110 Type 2 diabetes mellitus with ketoacidosis without coma: Secondary | ICD-10-CM | POA: Diagnosis present

## 2021-03-17 DIAGNOSIS — I159 Secondary hypertension, unspecified: Secondary | ICD-10-CM | POA: Diagnosis not present

## 2021-03-17 LAB — COMPREHENSIVE METABOLIC PANEL
ALT: 26 U/L (ref 0–44)
AST: 29 U/L (ref 15–41)
Albumin: 4 g/dL (ref 3.5–5.0)
Alkaline Phosphatase: 76 U/L (ref 38–126)
Anion gap: >20 — ABNORMAL HIGH (ref 5–15)
BUN: 42 mg/dL — ABNORMAL HIGH (ref 6–20)
CO2: 15 mmol/L — ABNORMAL LOW (ref 22–32)
Calcium: 9.3 mg/dL (ref 8.9–10.3)
Chloride: 104 mmol/L (ref 98–111)
Creatinine, Ser: 1.81 mg/dL — ABNORMAL HIGH (ref 0.44–1.00)
GFR, Estimated: 36 mL/min — ABNORMAL LOW (ref >60)
Glucose, Bld: 549 mg/dL (ref 70–99)
Potassium: 4.7 mmol/L (ref 3.5–5.1)
Sodium: 140 mmol/L (ref 135–145)
Total Bilirubin: 1.4 mg/dL — ABNORMAL HIGH (ref 0.3–1.2)
Total Protein: 7.5 g/dL (ref 6.5–8.1)

## 2021-03-17 LAB — LACTIC ACID, PLASMA
Lactic Acid, Venous: 3.8 mmol/L (ref 0.5–1.9)
Lactic Acid, Venous: 2.4 mmol/L (ref 0.5–1.9)

## 2021-03-17 LAB — CBC WITH DIFFERENTIAL/PLATELET
Abs Immature Granulocytes: 0.17 10*3/uL — ABNORMAL HIGH (ref 0.00–0.07)
Basophils Absolute: 0 10*3/uL (ref 0.0–0.1)
Basophils Relative: 0 %
Eosinophils Absolute: 0 10*3/uL (ref 0.0–0.5)
Eosinophils Relative: 0 %
HCT: 38.5 % (ref 36.0–46.0)
Hemoglobin: 11.8 g/dL — ABNORMAL LOW (ref 12.0–15.0)
Immature Granulocytes: 1 %
Lymphocytes Relative: 6 %
Lymphs Abs: 1.4 10*3/uL (ref 0.7–4.0)
MCH: 29.1 pg (ref 26.0–34.0)
MCHC: 30.6 g/dL (ref 30.0–36.0)
MCV: 95.1 fL (ref 80.0–100.0)
Monocytes Absolute: 2.1 10*3/uL — ABNORMAL HIGH (ref 0.1–1.0)
Monocytes Relative: 9 %
Neutro Abs: 19.3 10*3/uL — ABNORMAL HIGH (ref 1.7–7.7)
Neutrophils Relative %: 84 %
Platelets: 320 10*3/uL (ref 150–400)
RBC: 4.05 MIL/uL (ref 3.87–5.11)
RDW: 15.3 % (ref 11.5–15.5)
WBC: 22.9 10*3/uL — ABNORMAL HIGH (ref 4.0–10.5)
nRBC: 0 % (ref 0.0–0.2)

## 2021-03-17 LAB — MRSA NEXT GEN BY PCR, NASAL: MRSA by PCR Next Gen: NOT DETECTED

## 2021-03-17 LAB — BLOOD GAS, VENOUS
Acid-base deficit: 10.1 mmol/L — ABNORMAL HIGH (ref 0.0–2.0)
Bicarbonate: 16.2 mmol/L — ABNORMAL LOW (ref 20.0–28.0)
O2 Saturation: 85.3 %
Patient temperature: 37
pCO2, Ven: 36 mmHg — ABNORMAL LOW (ref 44–60)
pH, Ven: 7.26 (ref 7.25–7.43)
pO2, Ven: 55 mmHg — ABNORMAL HIGH (ref 32–45)

## 2021-03-17 LAB — BASIC METABOLIC PANEL
Anion gap: 9 (ref 5–15)
Anion gap: 7 (ref 5–15)
BUN: 40 mg/dL — ABNORMAL HIGH (ref 6–20)
BUN: 35 mg/dL — ABNORMAL HIGH (ref 6–20)
CO2: 23 mmol/L (ref 22–32)
CO2: 23 mmol/L (ref 22–32)
Calcium: 9.6 mg/dL (ref 8.9–10.3)
Calcium: 9.1 mg/dL (ref 8.9–10.3)
Chloride: 114 mmol/L — ABNORMAL HIGH (ref 98–111)
Chloride: 116 mmol/L — ABNORMAL HIGH (ref 98–111)
Creatinine, Ser: 1.36 mg/dL — ABNORMAL HIGH (ref 0.44–1.00)
Creatinine, Ser: 1.29 mg/dL — ABNORMAL HIGH (ref 0.44–1.00)
GFR, Estimated: 51 mL/min — ABNORMAL LOW (ref >60)
GFR, Estimated: 54 mL/min — ABNORMAL LOW (ref >60)
Glucose, Bld: 181 mg/dL — ABNORMAL HIGH (ref 70–99)
Glucose, Bld: 228 mg/dL — ABNORMAL HIGH (ref 70–99)
Potassium: 4.7 mmol/L (ref 3.5–5.1)
Potassium: 4.6 mmol/L (ref 3.5–5.1)
Sodium: 146 mmol/L — ABNORMAL HIGH (ref 135–145)
Sodium: 146 mmol/L — ABNORMAL HIGH (ref 135–145)

## 2021-03-17 LAB — BETA-HYDROXYBUTYRIC ACID
Beta-Hydroxybutyric Acid: 6.98 mmol/L — ABNORMAL HIGH (ref 0.05–0.27)
Beta-Hydroxybutyric Acid: 2.45 mmol/L — ABNORMAL HIGH (ref 0.05–0.27)

## 2021-03-17 LAB — RESP PANEL BY RT-PCR (FLU A&B, COVID) ARPGX2
Influenza A by PCR: NEGATIVE
Influenza B by PCR: NEGATIVE
SARS Coronavirus 2 by RT PCR: NEGATIVE

## 2021-03-17 LAB — HEMOGLOBIN A1C
Hgb A1c MFr Bld: 10.4 % — ABNORMAL HIGH (ref 4.8–5.6)
Mean Plasma Glucose: 251.78 mg/dL

## 2021-03-17 LAB — GLUCOSE, CAPILLARY
Glucose-Capillary: 145 mg/dL — ABNORMAL HIGH (ref 70–99)
Glucose-Capillary: 146 mg/dL — ABNORMAL HIGH (ref 70–99)
Glucose-Capillary: 147 mg/dL — ABNORMAL HIGH (ref 70–99)
Glucose-Capillary: 161 mg/dL — ABNORMAL HIGH (ref 70–99)
Glucose-Capillary: 181 mg/dL — ABNORMAL HIGH (ref 70–99)
Glucose-Capillary: 182 mg/dL — ABNORMAL HIGH (ref 70–99)
Glucose-Capillary: 187 mg/dL — ABNORMAL HIGH (ref 70–99)
Glucose-Capillary: 203 mg/dL — ABNORMAL HIGH (ref 70–99)
Glucose-Capillary: 233 mg/dL — ABNORMAL HIGH (ref 70–99)

## 2021-03-17 LAB — HCG, QUANTITATIVE, PREGNANCY: hCG, Beta Chain, Quant, S: <1 m[IU]/mL (ref <5)

## 2021-03-17 LAB — CBG MONITORING, ED
Glucose-Capillary: 539 mg/dL (ref 70–99)
Glucose-Capillary: 468 mg/dL — ABNORMAL HIGH (ref 70–99)
Glucose-Capillary: 407 mg/dL — ABNORMAL HIGH (ref 70–99)
Glucose-Capillary: 311 mg/dL — ABNORMAL HIGH (ref 70–99)
Glucose-Capillary: 359 mg/dL — ABNORMAL HIGH (ref 70–99)

## 2021-03-17 LAB — LIPASE, BLOOD: Lipase: 18 U/L (ref 11–51)

## 2021-03-17 MED ORDER — METRONIDAZOLE 500 MG/100ML IV SOLN
500.0000 mg | Freq: Two times a day (BID) | INTRAVENOUS | Status: DC
  Administered 2021-03-17 – 2021-03-19 (×4): 500 mg via INTRAVENOUS
  Filled 2021-03-17 (×4): qty 100

## 2021-03-17 MED ORDER — METRONIDAZOLE 500 MG/100ML IV SOLN: 500.0000 mg | Freq: Two times a day (BID) | INTRAVENOUS | Status: DC

## 2021-03-17 MED ORDER — DEXTROSE 50 % IV SOLN: 0.0000 mL | INTRAVENOUS | Status: DC | PRN

## 2021-03-17 MED ORDER — POTASSIUM CHLORIDE 10 MEQ/100ML IV SOLN
10.0000 meq | INTRAVENOUS | Status: AC
  Administered 2021-03-17 (×2): 10 meq via INTRAVENOUS
  Filled 2021-03-17 (×2): qty 100

## 2021-03-17 MED ORDER — ONDANSETRON HCL 4 MG/2ML IJ SOLN
4.0000 mg | Freq: Once | INTRAMUSCULAR | Status: AC
  Administered 2021-03-17: 4 mg via INTRAVENOUS
  Filled 2021-03-17: qty 2

## 2021-03-17 MED ORDER — SODIUM CHLORIDE 0.9 % IV SOLN
2.0000 g | Freq: Two times a day (BID) | INTRAVENOUS | Status: DC
  Administered 2021-03-17: 2 g via INTRAVENOUS
  Filled 2021-03-17: qty 2

## 2021-03-17 MED ORDER — DEXTROSE IN LACTATED RINGERS 5 % IV SOLN
INTRAVENOUS | Status: DC
  Filled 2021-03-17: qty 1000

## 2021-03-17 MED ORDER — LACTATED RINGERS IV SOLN: INTRAVENOUS | Status: DC

## 2021-03-17 MED ORDER — SODIUM CHLORIDE 0.9 % IV BOLUS
1000.0000 mL | Freq: Once | INTRAVENOUS | Status: AC
Start: 2021-03-17 — End: 2021-03-17
  Administered 2021-03-17: 1000 mL via INTRAVENOUS

## 2021-03-17 MED ORDER — ENOXAPARIN SODIUM 40 MG/0.4ML IJ SOSY
40.0000 mg | PREFILLED_SYRINGE | INTRAMUSCULAR | Status: DC
  Administered 2021-03-17 – 2021-03-20 (×4): 40 mg via SUBCUTANEOUS
  Filled 2021-03-17 (×4): qty 0.4

## 2021-03-17 MED ORDER — LACTATED RINGERS IV BOLUS
20.0000 mL/kg | Freq: Once | INTRAVENOUS | Status: AC
  Administered 2021-03-17: 1496 mL via INTRAVENOUS

## 2021-03-17 MED ORDER — PROCHLORPERAZINE EDISYLATE 10 MG/2ML IJ SOLN
10.0000 mg | Freq: Four times a day (QID) | INTRAMUSCULAR | Status: DC | PRN
  Administered 2021-03-17 – 2021-03-20 (×6): 10 mg via INTRAVENOUS
  Filled 2021-03-17 (×6): qty 2

## 2021-03-17 MED ORDER — SODIUM CHLORIDE 0.9 % IV SOLN: 2.0000 g | Freq: Once | INTRAVENOUS | Status: DC

## 2021-03-17 MED ORDER — SODIUM CHLORIDE 0.9 % IV SOLN
2.0000 g | Freq: Once | INTRAVENOUS | Status: AC
  Administered 2021-03-17: 2 g via INTRAVENOUS
  Filled 2021-03-17: qty 2

## 2021-03-17 MED ORDER — INSULIN REGULAR(HUMAN) IN NACL 100-0.9 UT/100ML-% IV SOLN
INTRAVENOUS | Status: DC
  Administered 2021-03-17: 8 [IU]/h via INTRAVENOUS
  Filled 2021-03-17: qty 100

## 2021-03-17 NOTE — ED Triage Notes (Signed)
Pt presents via EMS from jail with c/o hyperglycemia. CBG reading high for EMS. Pt is c/o vomiting for the past 2 days.

## 2021-03-17 NOTE — H&P (Signed)
History and Physical    Patient: [name] UPB:[id number] DOB: Jul 24, 1980 DOA: 03/17/2021 DOS: the patient was seen and examined on 03/17/2021 PCP: Maury Dus, MD  Patient coming from:  [facility]  Chief Complaint:  Chief Complaint  Patient presents with   Hyperglycemia    HPI: [name] is a 41 y.o. female with medical history significant of HLD, HTN, IDDM. Presenting with 2 days of N/V/D. She reports that she has been incarcerated for several days. Over the last 2 days she has developed N/V/D. She didn't try any medications for her symptoms. She reports a history of gastroparesis. She initially told the EDP that she has not taken her DM medications. However, she tells me that she has not taken her normal 70/30. She has been having sliding scale while in jail. She denies any fevers or abdominal pain. She denies any urinary symptoms. When her symptoms did not improve this morning, she was brought to the ED for evaluation.     Review of Systems: As mentioned in the history of present illness. All other systems reviewed and are negative. Past Medical History:  Diagnosis Date   Arthritis    "legs and hands" (08/21/2012)   Chronic bronchitis (HCC)    "get it q yr" (08/21/2012)   Exertional shortness of breath    GERD (gastroesophageal reflux disease)    Headache(784.0)    "weekly" (08/21/2012)   Hyperlipidemia    Hypertension    Incarceration    Was in jail for selling drugs - out Oct 2010.   Renal disorder    Tobacco abuse    Type I diabetes mellitus (HCC)    A1c > 11. Patient does not check BG or take insulin regularly.  Dx at age 29.   Past Surgical History:  Procedure Laterality Date   TUBAL LIGATION  2009   Social History:  reports that she has been smoking cigarettes. She has a 4.62 pack-year smoking history. She has never used smokeless tobacco. She reports current alcohol use. She reports current drug use. Drug: Marijuana.  Allergies  Allergen Reactions    Levonorgestrel-Ethinyl Estrad Cough    Sneezing, headaches.    History reviewed. No pertinent family history.  Prior to Admission medications   Medication Sig Start Date End Date Taking? Authorizing Provider  atorvastatin (LIPITOR) 40 MG tablet Take 1 tablet (40 mg total) by mouth daily. 01/11/21   Jackelyn Poling, DO  BD VEO INSULIN SYRINGE U/F 31G X 15/64" 0.3 ML MISC USE 1  SYRINGE 4 TIMES DAILY IN THE MORNING ,AT  NOON,  IN  THE  EVENING,  AND  AT  BEDTIME 03/07/21   Sabino Dick, DO  Benzoyl Peroxide 2.5 % gel Use daily on skin for breakouts and prevention 03/24/20   Leeroy Bock, MD  cephALEXin (KEFLEX) 500 MG capsule Take 1 capsule (500 mg total) by mouth 4 (four) times daily. 03/12/17   Almon Hercules, MD  clindamycin (CLEOCIN) 300 MG capsule Take 1 capsule (300 mg total) by mouth 2 (two) times daily. 05/29/17   Casey Burkitt, MD  clotrimazole (LOTRIMIN) 1 % external solution Apply 2-3 drops of solution, two or three times daily. Continue to use the drops for at least 14 days after your symptoms have gone. 05/29/17   Casey Burkitt, MD  doxycycline (VIBRA-TABS) 100 MG tablet Take 1 tablet (100 mg total) by mouth 2 (two) times daily for 7 days. 01/11/21   Jackelyn Poling, DO  fluconazole (DIFLUCAN) 150  MG tablet Take 1 tablet (150 mg total) by mouth daily. May repeat in 72 hours if no improvement 01/10/19   Allayne Stack, DO  glucose blood (ACCU-CHEK GUIDE) test strip Use to test blood sugar 2x per day 09/13/20   Maury Dus, MD  ibuprofen (ADVIL) 600 MG tablet Take 1 tablet (600 mg total) by mouth every 6 (six) hours as needed. 11/30/20   Achille Rich, PA-C  insulin NPH-regular Human (NOVOLIN 70/30) (70-30) 100 UNIT/ML injection Inject 23 Units into the skin 2 (two) times daily with a meal. 23 Units in the am and 15-20 units in pm. Patient taking differently: Inject 23 Units into the skin 2 (two) times daily with a meal. 23-25 in the morning Units in the am  and 15-20 units in pm. 09/20/16   Shon Hale, MD  metoCLOPramide (REGLAN) 10 MG tablet Take 1 tablet (10 mg total) by mouth every 6 (six) hours as needed for nausea. 02/10/17   Arvilla Market, MD  metroNIDAZOLE (FLAGYL) 500 MG tablet Take 1 tablet (500 mg total) by mouth 2 (two) times daily. 07/23/19   Arlyce Harman, MD  ondansetron (ZOFRAN) 4 MG tablet Take 1 tablet (4 mg total) by mouth every 6 (six) hours. 02/05/17   Hedges, Tinnie Gens, PA-C  pantoprazole (PROTONIX) 40 MG tablet Take 1 tablet (40 mg total) by mouth daily. 02/10/17   Arvilla Market, MD  polyethylene glycol [facility] / Ethelene Hal) packet Take 17 g by mouth daily as needed for mild constipation. 02/10/17   Arvilla Market, MD  triamcinolone cream (KENALOG) 0.1 % Apply 1 application topically 2 (two) times daily. 02/11/21   Littie Deeds, MD  valACYclovir (VALTREX) 1000 MG tablet Take 1 tablet (1,000 mg total) by mouth 2 (two) times daily. 03/24/20   Leeroy Bock, MD    Physical Exam: Vitals:   03/17/21 1000 03/17/21 1001 03/17/21 1045 03/17/21 1046  BP: 121/63 (!) 144/75 (!) 141/81 (!) 141/81  Pulse:  (!) 131 (!) 128 (!) 128  Resp:  (!) 26  20  Temp:      TempSrc:      SpO2: 99% 100% 100% 100%   General: 41 y.o. female resting in bed in NAD Eyes: PERRL, normal sclera ENMT: Nares patent w/o discharge, orophaynx clear, dentition normal, ears w/o discharge/lesions/ulcers Neck: Supple, trachea midline Cardiovascular: RRR, +S1, S2, no m/g/r, equal pulses throughout Respiratory: CTABL, no w/r/r, normal WOB GI: BS+, NDNT, no masses noted, no organomegaly noted MSK: No e/c/c Neuro: A&O x 3, no focal deficits  Data Reviewed:  CO2 15 Glucose 549 BUN 42 Scr 1.81 Anion gap >20 T bili 1.4 Lactic acid 3.8 WBC 22.9 pH 7.26 Betahydroxybutyric acid 6.98  Assessment and Plan: No notes have been filed under this hospital service. Service: Hospitalist DKA DM2     - admit to inpt, SDU      - continue Endotool for now, follow BMP     - check A1c     - she told the EDP that she hasn't been taking her insulin because they don't have it at jail; but she told me that she's been getting sliding scale at jail instead of her 70/30; at discharge, will need to coordinate a regimen that she can take at jail     - DM coordination     - c/o diarrhea for 2 days, question if this also contributed to her DKA, see diarrhea below  Diarrhea     - check GI PCR,  c diff     - enteric precautions for now  HTN     - not home meds listed for BP     - will have PRNs available  HLD     - resume home regimen when confirmed  Lactic acidosis Leukocytosis SIRS     - tachy, WBC 22K, lactic acid 3.8 and AKI; current suspected source is GI d/t her c/o diarrhea     - fluids, start cefepime/flagyl until infection r/o'd     - checking c diff/GI PCR; also check procal  AKI     - fluids, check renal US, watch nephrotoxins  Advance Care Planning:   Code Status: FULL  Consults: None  Family Communication: None at bedisde  Severity of Illness: The appropriate patient status for this patient is INPATIENT. Inpatient status is judged to be reasonable and necessary in order to provide the required intensity of service to ensure the patient's safety. The patient's presenting symptoms, physical exam findings, and initial radiographic and laboratory data in the context of their chronic comorbidities is felt to place them at high risk for further clinical deterioration. Furthermore, it is not anticipated that the patient will be medically stable for discharge from the hospital within 2 midnights of admission.   * I certify that at the point of admission it is my clinical judgment that the patient will require inpatient hospital care spanning beyond 2 midnights from the point of admission due to high intensity of service, high risk for further deterioration and high frequency of surveillance  required.*  Author: Teddy Spike, DO 03/17/2021 10:54 AM  For on call review [email or web address].

## 2021-03-17 NOTE — ED Provider Notes (Signed)
[facility]-EMERGENCY DEPT Provider Note   CSN: [id number] Arrival date & time: 03/17/21  0750     History  Chief Complaint  Patient presents with   Hyperglycemia    [name] is a 41 y.o. female.  The history is provided by the patient and medical records. No language interpreter was used.  Hyperglycemia Blood sugar level PTA:  High Severity:  Severe Onset quality:  Gradual Duration:  2 days Timing:  Constant Progression:  Unchanged Chronicity:  Recurrent Diabetes status:  Controlled with insulin Relieved by:  Nothing Ineffective treatments:  None tried Associated symptoms: abdominal pain, dehydration, fatigue, malaise, nausea and vomiting   Associated symptoms: no chest pain, no confusion, no dizziness, no dysuria, no fever, no shortness of breath and no weakness   Risk factors: hx of DKA       Home Medications Prior to Admission medications   Medication Sig Start Date End Date Taking? Authorizing Provider  atorvastatin (LIPITOR) 40 MG tablet Take 1 tablet (40 mg total) by mouth daily. 01/11/21   Jackelyn PolingWelborn, Ryan, DO  BD VEO INSULIN SYRINGE U/F 31G X 15/64" 0.3 ML MISC USE 1  SYRINGE 4 TIMES DAILY IN THE MORNING ,AT  NOON,  IN  THE  EVENING,  AND  AT  BEDTIME 03/07/21   Sabino DickEspinoza, Alejandra, DO  Benzoyl Peroxide 2.5 % gel Use daily on skin for breakouts and prevention 03/24/20   Leeroy BockAnderson, Chelsey L, MD  cephALEXin (KEFLEX) 500 MG capsule Take 1 capsule (500 mg total) by mouth 4 (four) times daily. 03/12/17   Almon HerculesGonfa, Taye T, MD  clindamycin (CLEOCIN) 300 MG capsule Take 1 capsule (300 mg total) by mouth 2 (two) times daily. 05/29/17   Casey BurkittFitzgerald, Hillary Moen, MD  clotrimazole (LOTRIMIN) 1 % external solution Apply 2-3 drops of solution, two or three times daily. Continue to use the drops for at least 14 days after your symptoms have gone. 05/29/17   Casey BurkittFitzgerald, Hillary Moen, MD  doxycycline (VIBRA-TABS) 100 MG tablet Take 1 tablet (100 mg total) by mouth  2 (two) times daily for 7 days. 01/11/21   Jackelyn PolingWelborn, Ryan, DO  fluconazole (DIFLUCAN) 150 MG tablet Take 1 tablet (150 mg total) by mouth daily. May repeat in 72 hours if no improvement 01/10/19   Allayne StackBeard, Samantha N, DO  glucose blood (ACCU-CHEK GUIDE) test strip Use to test blood sugar 2x per day 09/13/20   Maury DusWells, Ashleigh, MD  ibuprofen (ADVIL) 600 MG tablet Take 1 tablet (600 mg total) by mouth every 6 (six) hours as needed. 11/30/20   Achille Richansom, Riley, PA-C  insulin NPH-regular Human (NOVOLIN 70/30) (70-30) 100 UNIT/ML injection Inject 23 Units into the skin 2 (two) times daily with a meal. 23 Units in the am and 15-20 units in pm. Patient taking differently: Inject 23 Units into the skin 2 (two) times daily with a meal. 23-25 in the morning Units in the am and 15-20 units in pm. 09/20/16   Shon Haleimberlake, Kathryn S, MD  metoCLOPramide (REGLAN) 10 MG tablet Take 1 tablet (10 mg total) by mouth every 6 (six) hours as needed for nausea. 02/10/17   Arvilla MarketWallace, Catherine Lauren, MD  metroNIDAZOLE (FLAGYL) 500 MG tablet Take 1 tablet (500 mg total) by mouth 2 (two) times daily. 07/23/19   Arlyce HarmanLockamy, Timothy, MD  ondansetron (ZOFRAN) 4 MG tablet Take 1 tablet (4 mg total) by mouth every 6 (six) hours. 02/05/17   Hedges, Tinnie GensJeffrey, PA-C  pantoprazole (PROTONIX) 40 MG tablet Take 1 tablet (40  mg total) by mouth daily. 02/10/17   Arvilla Market, MD  polyethylene glycol [facility] Ethelene Hal) packet Take 17 g by mouth daily as needed for mild constipation. 02/10/17   Arvilla Market, MD  triamcinolone cream (KENALOG) 0.1 % Apply 1 application topically 2 (two) times daily. 02/11/21   Littie Deeds, MD  valACYclovir (VALTREX) 1000 MG tablet Take 1 tablet (1,000 mg total) by mouth 2 (two) times daily. 03/24/20   Leeroy Bock, MD      Allergies    Levonorgestrel-ethinyl estrad    Review of Systems   Review of Systems  Constitutional:  Positive for fatigue. Negative for chills and fever.  HENT:  Negative  for congestion.   Respiratory:  Negative for cough, chest tightness, shortness of breath and wheezing.   Cardiovascular:  Negative for chest pain and palpitations.  Gastrointestinal:  Positive for abdominal pain, nausea and vomiting. Negative for constipation.  Genitourinary:  Negative for dysuria and flank pain.  Musculoskeletal:  Negative for back pain, neck pain and neck stiffness.  Skin:  Negative for rash and wound.  Neurological:  Negative for dizziness, weakness and light-headedness.  Psychiatric/Behavioral:  Negative for agitation and confusion.   All other systems reviewed and are negative.  Physical Exam Updated Vital Signs BP (!) 141/81    Pulse (!) 128    Temp 97.8 F (36.6 C) (Oral)    Resp 20    LMP 02/23/2021 (Approximate)    SpO2 100%  Physical Exam Vitals and nursing note reviewed.  Constitutional:      General: She is not in acute distress.    Appearance: She is well-developed. She is not ill-appearing, toxic-appearing or diaphoretic.  HENT:     Head: Normocephalic and atraumatic.     Nose: No congestion or rhinorrhea.     Mouth/Throat:     Mouth: Mucous membranes are dry.     Pharynx: No oropharyngeal exudate or posterior oropharyngeal erythema.  Eyes:     Conjunctiva/sclera: Conjunctivae normal.     Pupils: Pupils are equal, round, and reactive to light.  Cardiovascular:     Rate and Rhythm: Regular rhythm. Tachycardia present.     Heart sounds: No murmur heard. Pulmonary:     Effort: Pulmonary effort is normal. No respiratory distress.     Breath sounds: Normal breath sounds. No wheezing, rhonchi or rales.  Chest:     Chest wall: No tenderness.  Abdominal:     General: Abdomen is flat.     Palpations: Abdomen is soft.     Tenderness: There is no abdominal tenderness. There is no right CVA tenderness, left CVA tenderness, guarding or rebound.  Musculoskeletal:        General: No swelling or tenderness.     Cervical back: Neck supple. No tenderness.      Right lower leg: No edema.     Left lower leg: No edema.  Skin:    General: Skin is warm and dry.     Capillary Refill: Capillary refill takes less than 2 seconds.     Findings: No erythema.  Neurological:     General: No focal deficit present.     Mental Status: She is alert.     Sensory: No sensory deficit.     Motor: No weakness.  Psychiatric:        Mood and Affect: Mood normal.    ED Results / Procedures / Treatments   Labs (all labs ordered are listed, but only abnormal results  are displayed) Labs Reviewed  CBC WITH DIFFERENTIAL/PLATELET - Abnormal; Notable for the following components:      Result Value   WBC 22.9 (*)    Hemoglobin 11.8 (*)    Neutro Abs 19.3 (*)    Monocytes Absolute 2.1 (*)    Abs Immature Granulocytes 0.17 (*)    All other components within normal limits  LACTIC ACID, PLASMA - Abnormal; Notable for the following components:   Lactic Acid, Venous 3.8 (*)    All other components within normal limits  LACTIC ACID, PLASMA - Abnormal; Notable for the following components:   Lactic Acid, Venous 2.4 (*)    All other components within normal limits  BLOOD GAS, VENOUS - Abnormal; Notable for the following components:   pCO2, Ven 36 (*)    pO2, Ven 55.0 (*)    Bicarbonate 16.2 (*)    Acid-base deficit 10.1 (*)    All other components within normal limits  BETA-HYDROXYBUTYRIC ACID - Abnormal; Notable for the following components:   Beta-Hydroxybutyric Acid 6.98 (*)    All other components within normal limits  COMPREHENSIVE METABOLIC PANEL - Abnormal; Notable for the following components:   CO2 15 (*)    Glucose, Bld 549 (*)    BUN 42 (*)    Creatinine, Ser 1.81 (*)    Total Bilirubin 1.4 (*)    GFR, Estimated 36 (*)    Anion gap >20 (*)    All other components within normal limits  CBG MONITORING, ED - Abnormal; Notable for the following components:   Glucose-Capillary 539 (*)    All other components within normal limits  CBG MONITORING, ED -  Abnormal; Notable for the following components:   Glucose-Capillary 468 (*)    All other components within normal limits  CBG MONITORING, ED - Abnormal; Notable for the following components:   Glucose-Capillary 407 (*)    All other components within normal limits  RESP PANEL BY RT-PCR (FLU A&B, COVID) ARPGX2  URINE CULTURE  GASTROINTESTINAL PANEL BY PCR, STOOL (REPLACES STOOL CULTURE)  C DIFFICILE QUICK SCREEN W PCR REFLEX    HCG, QUANTITATIVE, PREGNANCY  LIPASE, BLOOD  URINALYSIS, ROUTINE W REFLEX MICROSCOPIC  BASIC METABOLIC PANEL  BASIC METABOLIC PANEL  BASIC METABOLIC PANEL  BASIC METABOLIC PANEL    EKG EKG Interpretation  Date/Time:  Thursday March 17 2021 08:38:53 EST Ventricular Rate:  127 PR Interval:  118 QRS Duration: 88 QT Interval:  307 QTC Calculation: 447 R Axis:   78 Text Interpretation: Sinus tachycardia Probable left atrial enlargement Minimal ST depression, inferior leads when compared to prior, faster rate. No STEMI Confirmed by Theda Belfast ([id number]) on 03/17/2021 9:16:49 AM  Radiology No results found.  Procedures Procedures    Medications Ordered in ED Medications  lactated ringers bolus 20 mL/kg (has no administration in time range)  insulin regular, human (MYXREDLIN) 100 units/ 100 mL infusion (has no administration in time range)  lactated ringers infusion (has no administration in time range)  dextrose 5 % in lactated ringers infusion (has no administration in time range)  dextrose 50 % solution 0-50 mL (has no administration in time range)  potassium chloride 10 mEq in 100 mL IVPB (has no administration in time range)  prochlorperazine (COMPAZINE) injection 10 mg (has no administration in time range)  sodium chloride 0.9 % bolus 1,000 mL (1,000 mLs Intravenous New Bag/Given 03/17/21 0901)  ondansetron (ZOFRAN) injection 4 mg (4 mg Intravenous Given 03/17/21 0902)    CRITICAL CARE Performed  by: Canary Brimhristopher J Tanina Barb Total critical care  time: 35 minutes Critical care time was exclusive of separately billable procedures and treating other patients. Critical care was necessary to treat or prevent imminent or life-threatening deterioration. Critical care was time spent personally by me on the following activities: development of treatment plan with patient and/or surrogate as well as nursing, discussions with consultants, evaluation of patient's response to treatment, examination of patient, obtaining history from patient or surrogate, ordering and performing treatments and interventions, ordering and review of laboratory studies, ordering and review of radiographic studies, pulse oximetry and re-evaluation of patient's condition.   ED Course/ Medical Decision Making/ A&P                           Medical Decision Making Amount and/or Complexity of Data Reviewed Labs: ordered.  Risk Prescription drug management. Decision regarding hospitalization.    Rachel Crosbyatrice R Amirault is a 41 y.o. female with a past medical history significant for type 1 diabetes with previous DKA, gastroparesis, hypertension, anemia, GERD, and chronic bronchitis who presents with hyperglycemia, nausea, vomiting, diarrhea, and abdominal discomfort.  According to patient, for the last 2 days, she has had nausea and vomiting and has been feeling worse.  She reports she has not been taking her diabetic medications.  She says that her pain is moderate in her abdomen and cramping and worse with vomiting.  She reports some diarrhea.  She does report she had occasional blood in her emesis but says it feels similar to previous DKA in the past.  She denies fevers, chills, Jassen, cough, chest pain, or shortness of breath.  She denies any traumatic injuries or other complaints at this time.  She is in handcuffs with law enforcement at the bedside.  On exam, patient is tachycardic with a rate in the 130s.  She has symmetric pulses in extremities.  Lungs are clear and chest is  nontender.  Abdomen was not tender on my exam I did hear bowel sounds.  Back and flanks nontender.  No focal neurologic deficits initially.  Patient is resting but nauseous and uncomfortable.  Clinically I am concerned that recurrent DKA given the patient's vital signs and symptoms on presentation.  Will start with some fluids and some nausea medicine and get some labs.  Anticipate she will need insulin as her initial glucose was over 500.   Anticipate reassessment after work-up to determine disposition.  Initial blood work hemolyzed.  It was recollected and sent.  Patient's work-up does show evidence of DKA with an anion gap greater than 20.  pH is slightly acidotic at 7.26.  Lactic acid is elevated and beta hydroxy uric acid is also 6.98.  Potassium is 4.7, will use the DKA order set and provide some potassium as well as the insulin drip and fluids.  She is not pregnant.  White count is elevated possibly from demargination from all the vomiting.  She denies urinary symptoms but urinalysis is waiting to be collected.  COVID and flu negative.  Abdominal pain has improved as she is resting now.  Given her lack of tenderness on exam, no white count, and lack of fever have low suspicion for a significant bacterial infection however viral gastroenteritis could have triggered her symptoms with nausea, vomiting, diarrhea.  Dr. Ronaldo MiyamotoKyle with medicine team called for admission and they will see for admission.  Patient will be admitted for further management.  Final Clinical Impression(s) / ED Diagnoses Final diagnoses:  Diabetic ketoacidosis without coma associated with type 1 diabetes mellitus (HCC)  Nausea vomiting and diarrhea     Clinical Impression: 1. Diabetic ketoacidosis without coma associated with type 1 diabetes mellitus (HCC)   2. Nausea vomiting and diarrhea     Disposition: Admit  This note was prepared with assistance of Dragon voice recognition software. Occasional  wrong-word or sound-a-like substitutions may have occurred due to the inherent limitations of voice recognition software.     Emett Stapel, Canary Brim, MD 03/17/21 [phone]

## 2021-03-17 NOTE — ED Notes (Signed)
New CMP and lactic sent at this time. Verified with lab this blood work has been received.

## 2021-03-17 NOTE — Progress Notes (Addendum)
Pharmacy Antibiotic Note  [name] is a 41 y.o. female admitted on 03/17/2021 with hyperglycemia and diarrhea, found to be in DKA.  Patient meeting SIRS criteria on admission w/ tachycardia, WBC 22K, and lactic acid 3.8.  Pharmacy has been consulted for cefepime dosing while ruling out intra-abdominal infection.  Based on last documented weight of 165 lb on 02/11/21, estimated CrCl currently is ~41 mL/min.   Plan: Cefepime 2g q12 hours for CrCl 30-50 mL/min Flagyl 500mg  IV q12 hours per MD F/u updated weight for cefepime dose adjustments F/u GI workup, ability to narrow antibiotics    Temp (24hrs), Avg:97.8 F (36.6 C), Min:97.8 F (36.6 C), Max:97.8 F (36.6 C)  Recent Labs  Lab 03/17/21 0848 03/17/21 0939  WBC 22.9*  --   CREATININE  --  1.81*  LATICACIDVEN  --  3.8*    CrCl cannot be calculated (Unknown ideal weight.).    Allergies  Allergen Reactions   Levonorgestrel-Ethinyl Estrad Cough    Sneezing, headaches.    Antimicrobials this admission: Cefepime 2/16 >>  Flagyl 2/16 >>   Dose adjustments this admission:   Microbiology results: 2/16 UCx: ordered   Thank you for allowing pharmacy to be a part of this patients care.  3/16, PharmD 03/17/2021 1:03 PM

## 2021-03-17 NOTE — ED Notes (Signed)
BG 549 and Lactic Acid 3.8. Received from lab at this time. EDP notified and aware.

## 2021-03-17 NOTE — ED Notes (Signed)
Hospitalist at the bedside 

## 2021-03-18 ENCOUNTER — Other Ambulatory Visit: Payer: Self-pay

## 2021-03-18 DIAGNOSIS — N179 Acute kidney failure, unspecified: Secondary | ICD-10-CM | POA: Diagnosis not present

## 2021-03-18 DIAGNOSIS — E103599 Type 1 diabetes mellitus with proliferative diabetic retinopathy without macular edema, unspecified eye: Secondary | ICD-10-CM

## 2021-03-18 DIAGNOSIS — E1010 Type 1 diabetes mellitus with ketoacidosis without coma: Principal | ICD-10-CM

## 2021-03-18 LAB — PROCALCITONIN: Procalcitonin: 1.67 ng/mL

## 2021-03-18 LAB — CBC
HCT: 33 % — ABNORMAL LOW (ref 36.0–46.0)
Hemoglobin: 10.7 g/dL — ABNORMAL LOW (ref 12.0–15.0)
MCH: 29.4 pg (ref 26.0–34.0)
MCHC: 32.4 g/dL (ref 30.0–36.0)
MCV: 90.7 fL (ref 80.0–100.0)
Platelets: 270 10*3/uL (ref 150–400)
RBC: 3.64 MIL/uL — ABNORMAL LOW (ref 3.87–5.11)
RDW: 15.3 % (ref 11.5–15.5)
WBC: 17.7 10*3/uL — ABNORMAL HIGH (ref 4.0–10.5)
nRBC: 0 % (ref 0.0–0.2)

## 2021-03-18 LAB — BASIC METABOLIC PANEL
Anion gap: 8 (ref 5–15)
Anion gap: 8 (ref 5–15)
BUN: 31 mg/dL — ABNORMAL HIGH (ref 6–20)
BUN: 26 mg/dL — ABNORMAL HIGH (ref 6–20)
CO2: 24 mmol/L (ref 22–32)
CO2: 24 mmol/L (ref 22–32)
Calcium: 9.5 mg/dL (ref 8.9–10.3)
Calcium: 9.2 mg/dL (ref 8.9–10.3)
Chloride: 115 mmol/L — ABNORMAL HIGH (ref 98–111)
Chloride: 116 mmol/L — ABNORMAL HIGH (ref 98–111)
Creatinine, Ser: 1.1 mg/dL — ABNORMAL HIGH (ref 0.44–1.00)
Creatinine, Ser: 1.03 mg/dL — ABNORMAL HIGH (ref 0.44–1.00)
GFR, Estimated: >60 mL/min (ref >60)
GFR, Estimated: >60 mL/min (ref >60)
Glucose, Bld: 178 mg/dL — ABNORMAL HIGH (ref 70–99)
Glucose, Bld: 170 mg/dL — ABNORMAL HIGH (ref 70–99)
Potassium: 4.2 mmol/L (ref 3.5–5.1)
Potassium: 4 mmol/L (ref 3.5–5.1)
Sodium: 147 mmol/L — ABNORMAL HIGH (ref 135–145)
Sodium: 148 mmol/L — ABNORMAL HIGH (ref 135–145)

## 2021-03-18 LAB — GLUCOSE, CAPILLARY
Glucose-Capillary: 142 mg/dL — ABNORMAL HIGH (ref 70–99)
Glucose-Capillary: 146 mg/dL — ABNORMAL HIGH (ref 70–99)
Glucose-Capillary: 150 mg/dL — ABNORMAL HIGH (ref 70–99)
Glucose-Capillary: 153 mg/dL — ABNORMAL HIGH (ref 70–99)
Glucose-Capillary: 155 mg/dL — ABNORMAL HIGH (ref 70–99)
Glucose-Capillary: 157 mg/dL — ABNORMAL HIGH (ref 70–99)
Glucose-Capillary: 169 mg/dL — ABNORMAL HIGH (ref 70–99)
Glucose-Capillary: 171 mg/dL — ABNORMAL HIGH (ref 70–99)
Glucose-Capillary: 176 mg/dL — ABNORMAL HIGH (ref 70–99)
Glucose-Capillary: 189 mg/dL — ABNORMAL HIGH (ref 70–99)
Glucose-Capillary: 198 mg/dL — ABNORMAL HIGH (ref 70–99)
Glucose-Capillary: 205 mg/dL — ABNORMAL HIGH (ref 70–99)

## 2021-03-18 LAB — PROTIME-INR
INR: 1.2 (ref 0.8–1.2)
Prothrombin Time: 15.2 seconds (ref 11.4–15.2)

## 2021-03-18 LAB — CORTISOL-AM, BLOOD: Cortisol - AM: 21.3 ug/dL (ref 6.7–22.6)

## 2021-03-18 LAB — BETA-HYDROXYBUTYRIC ACID: Beta-Hydroxybutyric Acid: 1.43 mmol/L — ABNORMAL HIGH (ref 0.05–0.27)

## 2021-03-18 MED ORDER — ONDANSETRON HCL 4 MG/2ML IJ SOLN
4.0000 mg | Freq: Once | INTRAMUSCULAR | Status: AC
Start: 2021-03-18 — End: 2021-03-18
  Administered 2021-03-18: 4 mg via INTRAVENOUS
  Filled 2021-03-18: qty 2

## 2021-03-18 MED ORDER — LABETALOL HCL 5 MG/ML IV SOLN
10.0000 mg | INTRAVENOUS | Status: DC | PRN
Start: 2021-03-18 — End: 2021-03-21
  Administered 2021-03-18 – 2021-03-20 (×7): 10 mg via INTRAVENOUS
  Filled 2021-03-18 (×7): qty 4

## 2021-03-18 MED ORDER — INSULIN ASPART PROT & ASPART (70-30 MIX) 100 UNIT/ML ~~LOC~~ SUSP
15.0000 [IU] | Freq: Two times a day (BID) | SUBCUTANEOUS | Status: DC
  Administered 2021-03-18 – 2021-03-19 (×3): 15 [IU] via SUBCUTANEOUS
  Filled 2021-03-18: qty 10

## 2021-03-18 MED ORDER — HYDRALAZINE HCL 25 MG PO TABS
25.0000 mg | ORAL_TABLET | ORAL | Status: DC | PRN
  Administered 2021-03-18 – 2021-03-20 (×4): 25 mg via ORAL
  Filled 2021-03-18 (×4): qty 1

## 2021-03-18 MED ORDER — INSULIN ASPART 100 UNIT/ML IJ SOLN: 0.0000 [IU] | Freq: Every day | INTRAMUSCULAR | Status: DC

## 2021-03-18 MED ORDER — CHLORHEXIDINE GLUCONATE CLOTH 2 % EX PADS
6.0000 | MEDICATED_PAD | Freq: Every day | CUTANEOUS | Status: DC
  Administered 2021-03-18: 6 via TOPICAL

## 2021-03-18 MED ORDER — INSULIN ASPART 100 UNIT/ML IJ SOLN
0.0000 [IU] | Freq: Three times a day (TID) | INTRAMUSCULAR | Status: DC
  Administered 2021-03-18 – 2021-03-19 (×3): 3 [IU] via SUBCUTANEOUS
  Administered 2021-03-19: 11 [IU] via SUBCUTANEOUS
  Administered 2021-03-19: 3 [IU] via SUBCUTANEOUS
  Administered 2021-03-20: 5 [IU] via SUBCUTANEOUS
  Administered 2021-03-20: 8 [IU] via SUBCUTANEOUS
  Administered 2021-03-20: 11 [IU] via SUBCUTANEOUS
  Administered 2021-03-21: 15 [IU] via SUBCUTANEOUS

## 2021-03-18 MED ORDER — ONDANSETRON HCL 4 MG/2ML IJ SOLN
4.0000 mg | Freq: Four times a day (QID) | INTRAMUSCULAR | Status: DC | PRN
  Administered 2021-03-18 – 2021-03-20 (×3): 4 mg via INTRAVENOUS
  Filled 2021-03-18 (×3): qty 2

## 2021-03-18 MED ORDER — SODIUM CHLORIDE 0.9 % IV SOLN
2.0000 g | Freq: Three times a day (TID) | INTRAVENOUS | Status: DC
  Administered 2021-03-18 – 2021-03-19 (×4): 2 g via INTRAVENOUS
  Filled 2021-03-18 (×5): qty 2

## 2021-03-18 NOTE — Progress Notes (Signed)
Progress Note   Patient: TONIE ELSEY GGY:[id number] DOB: 1981-01-08 DOA: 03/17/2021     1 DOS: the patient was seen and examined on 03/18/2021   Brief hospital course: Ms. Candy is a 41 yo female with PMH IDDM, HLD, HTN who presented with N/V/D.  She has been in jail since February 14.  She does have a history of gastroparesis as well. On admission it appears she may have not been having insulin as per her normal home regimen and may have developed DKA. She was started on insulin drip and fluids.  She responded well and was transitioned to subcutaneous insulins.  Assessment and Plan: * DKA (diabetic ketoacidosis) (HCC)-resolved as of 03/18/2021, (present on admission) - etiology unclear but possibly from N/V/D vs missed insulin doses recently vs combo - regardless, treated for DKA on admission - will transition off insulin drip today to SQ insulin and adjust as needed  Type 1 diabetes mellitus with proliferative retinopathy (HCC)- (present on admission) - uses Humalog 70/30 and Novolog outpatient - contacted the jail, they do have insulins needed on hand - will transition off insulin drip today to 70/30 and novolog SSI, then adjust as necessary   Essential hypertension, benign- (present on admission) - No home BP meds noted; blood pressure has been elevated during hospitalization.  Patient states due to stress -Treat with as needed labetalol or hydralazine for now. -May need maintenance medication prior to discharge  Diarrhea - No diarrhea since admission.  C. difficile testing has been ordered.  If no stool specimen by 48 hours, okay to discontinue order and isolation precautions  Intractable nausea and vomiting- (present on admission) - Continue Compazine and Zofran as needed  HYPERCHOLESTEROLEMIA- (present on admission) - Statin on hold for now  AKI (acute kidney injury) (HCC)-resolved as of 03/18/2021 - Suspected due to hypovolemia on admission - Responded well to  fluids  SIRS (systemic inflammatory response syndrome) (HCC)-resolved as of 03/18/2021 - Low suspicion for infection at this time.  Likely due to DKA on presentation      Subjective: Seen this morning in her room.  Guard present bedside.  She was feeling a little better and open for starting some diet. Still some nausea lingering.  Physical Exam: Vitals:   03/18/21 1329 03/18/21 1330 03/18/21 1400 03/18/21 1500  BP:  (!) 115/54 123/64 (!) 158/69  Pulse:  95    Resp:  (!) 34 19 16  Temp: 98.3 F (36.8 C)     TempSrc: Oral     SpO2:  97% 97% 98%  Weight:      Physical Exam Constitutional:      General: She is not in acute distress. HENT:     Head: Normocephalic and atraumatic.     Mouth/Throat:     Mouth: Mucous membranes are moist.  Eyes:     Extraocular Movements: Extraocular movements intact.  Cardiovascular:     Rate and Rhythm: Normal rate and regular rhythm.  Pulmonary:     Effort: Pulmonary effort is normal.     Breath sounds: Normal breath sounds.  Abdominal:     General: Bowel sounds are normal. There is no distension.     Palpations: Abdomen is soft.     Tenderness: There is no abdominal tenderness.  Musculoskeletal:        General: Normal range of motion.     Cervical back: Normal range of motion and neck supple.  Skin:    General: Skin is warm and dry.  Neurological:  General: No focal deficit present.     Mental Status: She is alert.  Psychiatric:        Mood and Affect: Mood normal.        Behavior: Behavior normal.     Data Reviewed:  Results for orders placed or performed during the hospital encounter of 03/17/21 (from the past 24 hour(s))  MRSA Next Gen by PCR, Nasal     Status: None   Collection Time: 03/17/21  3:38 PM   Specimen: Nasal Mucosa; Nasal Swab  Result Value Ref Range   MRSA by PCR Next Gen NOT DETECTED NOT DETECTED  Glucose, capillary     Status: Abnormal   Collection Time: 03/17/21  4:27 PM  Result Value Ref Range    Glucose-Capillary 182 (H) 70 - 99 mg/dL  Basic metabolic panel     Status: Abnormal   Collection Time: 03/17/21  4:56 PM  Result Value Ref Range   Sodium 146 (H) 135 - 145 mmol/L   Potassium 4.7 3.5 - 5.1 mmol/L   Chloride 114 (H) 98 - 111 mmol/L   CO2 23 22 - 32 mmol/L   Glucose, Bld 181 (H) 70 - 99 mg/dL   BUN 40 (H) 6 - 20 mg/dL   Creatinine, Ser 0.981.36 (H) 0.44 - 1.00 mg/dL   Calcium 9.6 8.9 - 11.910.3 mg/dL   GFR, Estimated 51 (L) >60 mL/min   Anion gap 9 5 - 15  Hemoglobin A1c     Status: Abnormal   Collection Time: 03/17/21  4:56 PM  Result Value Ref Range   Hgb A1c MFr Bld 10.4 (H) 4.8 - 5.6 %   Mean Plasma Glucose 251.78 mg/dL  Glucose, capillary     Status: Abnormal   Collection Time: 03/17/21  5:41 PM  Result Value Ref Range   Glucose-Capillary 147 (H) 70 - 99 mg/dL   Comment 1 Notify RN   Glucose, capillary     Status: Abnormal   Collection Time: 03/17/21  6:50 PM  Result Value Ref Range   Glucose-Capillary 187 (H) 70 - 99 mg/dL   Comment 1 Notify RN    Comment 2 Document in Chart   Glucose, capillary     Status: Abnormal   Collection Time: 03/17/21  7:50 PM  Result Value Ref Range   Glucose-Capillary 146 (H) 70 - 99 mg/dL  Basic metabolic panel     Status: Abnormal   Collection Time: 03/17/21  8:44 PM  Result Value Ref Range   Sodium 146 (H) 135 - 145 mmol/L   Potassium 4.6 3.5 - 5.1 mmol/L   Chloride 116 (H) 98 - 111 mmol/L   CO2 23 22 - 32 mmol/L   Glucose, Bld 228 (H) 70 - 99 mg/dL   BUN 35 (H) 6 - 20 mg/dL   Creatinine, Ser 1.471.29 (H) 0.44 - 1.00 mg/dL   Calcium 9.1 8.9 - 82.910.3 mg/dL   GFR, Estimated 54 (L) >60 mL/min   Anion gap 7 5 - 15  Beta-hydroxybutyric acid     Status: Abnormal   Collection Time: 03/17/21  8:44 PM  Result Value Ref Range   Beta-Hydroxybutyric Acid 2.45 (H) 0.05 - 0.27 mmol/L  Glucose, capillary     Status: Abnormal   Collection Time: 03/17/21  8:44 PM  Result Value Ref Range   Glucose-Capillary 203 (H) 70 - 99 mg/dL  Glucose,  capillary     Status: Abnormal   Collection Time: 03/17/21  9:46 PM  Result Value Ref Range  Glucose-Capillary 161 (H) 70 - 99 mg/dL  Glucose, capillary     Status: Abnormal   Collection Time: 03/17/21 10:51 PM  Result Value Ref Range   Glucose-Capillary 181 (H) 70 - 99 mg/dL  Glucose, capillary     Status: Abnormal   Collection Time: 03/17/21 11:51 PM  Result Value Ref Range   Glucose-Capillary 145 (H) 70 - 99 mg/dL  Basic metabolic panel     Status: Abnormal   Collection Time: 03/18/21 12:46 AM  Result Value Ref Range   Sodium 147 (H) 135 - 145 mmol/L   Potassium 4.2 3.5 - 5.1 mmol/L   Chloride 115 (H) 98 - 111 mmol/L   CO2 24 22 - 32 mmol/L   Glucose, Bld 178 (H) 70 - 99 mg/dL   BUN 31 (H) 6 - 20 mg/dL   Creatinine, Ser 8.82 (H) 0.44 - 1.00 mg/dL   Calcium 9.5 8.9 - 80.0 mg/dL   GFR, Estimated >34 >91 mL/min   Anion gap 8 5 - 15  Beta-hydroxybutyric acid     Status: Abnormal   Collection Time: 03/18/21 12:46 AM  Result Value Ref Range   Beta-Hydroxybutyric Acid 1.43 (H) 0.05 - 0.27 mmol/L  Glucose, capillary     Status: Abnormal   Collection Time: 03/18/21 12:47 AM  Result Value Ref Range   Glucose-Capillary 176 (H) 70 - 99 mg/dL  Glucose, capillary     Status: Abnormal   Collection Time: 03/18/21  1:47 AM  Result Value Ref Range   Glucose-Capillary 198 (H) 70 - 99 mg/dL  Glucose, capillary     Status: Abnormal   Collection Time: 03/18/21  2:51 AM  Result Value Ref Range   Glucose-Capillary 171 (H) 70 - 99 mg/dL  Glucose, capillary     Status: Abnormal   Collection Time: 03/18/21  3:59 AM  Result Value Ref Range   Glucose-Capillary 150 (H) 70 - 99 mg/dL  Glucose, capillary     Status: Abnormal   Collection Time: 03/18/21  5:07 AM  Result Value Ref Range   Glucose-Capillary 153 (H) 70 - 99 mg/dL  Basic metabolic panel     Status: Abnormal   Collection Time: 03/18/21  5:18 AM  Result Value Ref Range   Sodium 148 (H) 135 - 145 mmol/L   Potassium 4.0 3.5 - 5.1  mmol/L   Chloride 116 (H) 98 - 111 mmol/L   CO2 24 22 - 32 mmol/L   Glucose, Bld 170 (H) 70 - 99 mg/dL   BUN 26 (H) 6 - 20 mg/dL   Creatinine, Ser 7.91 (H) 0.44 - 1.00 mg/dL   Calcium 9.2 8.9 - 50.5 mg/dL   GFR, Estimated >69 >79 mL/min   Anion gap 8 5 - 15  Protime-INR     Status: None   Collection Time: 03/18/21  5:18 AM  Result Value Ref Range   Prothrombin Time 15.2 11.4 - 15.2 seconds   INR 1.2 0.8 - 1.2  Cortisol-am, blood     Status: None   Collection Time: 03/18/21  5:18 AM  Result Value Ref Range   Cortisol - AM 21.3 6.7 - 22.6 ug/dL  Procalcitonin     Status: None   Collection Time: 03/18/21  5:18 AM  Result Value Ref Range   Procalcitonin 1.67 ng/mL  CBC     Status: Abnormal   Collection Time: 03/18/21  5:18 AM  Result Value Ref Range   WBC 17.7 (H) 4.0 - 10.5 K/uL   RBC 3.64 (L)  3.87 - 5.11 MIL/uL   Hemoglobin 10.7 (L) 12.0 - 15.0 g/dL   HCT 67.5 (L) 44.9 - 20.1 %   MCV 90.7 80.0 - 100.0 fL   MCH 29.4 26.0 - 34.0 pg   MCHC 32.4 30.0 - 36.0 g/dL   RDW 00.7 12.1 - 97.5 %   Platelets 270 150 - 400 K/uL   nRBC 0.0 0.0 - 0.2 %  Glucose, capillary     Status: Abnormal   Collection Time: 03/18/21  6:58 AM  Result Value Ref Range   Glucose-Capillary 205 (H) 70 - 99 mg/dL  Glucose, capillary     Status: Abnormal   Collection Time: 03/18/21  8:10 AM  Result Value Ref Range   Glucose-Capillary 142 (H) 70 - 99 mg/dL  Glucose, capillary     Status: Abnormal   Collection Time: 03/18/21  9:35 AM  Result Value Ref Range   Glucose-Capillary 189 (H) 70 - 99 mg/dL  Glucose, capillary     Status: Abnormal   Collection Time: 03/18/21 10:43 AM  Result Value Ref Range   Glucose-Capillary 157 (H) 70 - 99 mg/dL  Glucose, capillary     Status: Abnormal   Collection Time: 03/18/21 12:02 PM  Result Value Ref Range   Glucose-Capillary 169 (H) 70 - 99 mg/dL    I have Reviewed nursing notes, Vitals, and Lab results since pt's last encounter. Pertinent lab results : see above I  have ordered test including BMP, CBC, Mg I have reviewed the last note from staff over past 24 hours I have discussed pt's care plan and test results with nursing staff, case manager   Family Communication:   Disposition: Status is: Inpatient Remains inpatient appropriate because: Treatment as outlined in A&P    Planned Discharge Destination:  Back to jail      Author: Lewie Chamber, MD 03/18/2021 3:31 PM  For on call review [email or web address].

## 2021-03-18 NOTE — Progress Notes (Addendum)
Inpatient Diabetes Program Recommendations  AACE/ADA: New Consensus Statement on Inpatient Glycemic Control (2015)  Target Ranges:  Prepandial:   less than 140 mg/dL      Peak postprandial:   less than 180 mg/dL (1-2 hours)      Critically ill patients:  140 - 180 mg/dL   Lab Results  Component Value Date   GLUCAP 189 (H) 03/18/2021   HGBA1C 10.4 (H) 03/17/2021    Review of Glycemic Control  Latest Reference Range & Units 03/18/21 03:59 03/18/21 05:07 03/18/21 06:58 03/18/21 08:10 03/18/21 09:35  Glucose-Capillary 70 - 99 mg/dL 387 (H) 564 (H) 332 (H) 142 (H) 189 (H)   Diabetes history: DM 1 Outpatient Diabetes medications:  Novolin 70/30 - 23 units bid Novolog 0-14 units tid with meals Current orders for Inpatient glycemic control:  Novolog moderate tid with meals and HS Novolog mix 70/30 15 units bid  Inpatient Diabetes Program Recommendations:    Agree with orders.  Will discuss with patient.  It appears that she was not getting the proper insulin in jail which could have contributed to admission?   Will follow.   Thanks,  Beryl Meager, RN, BC-ADM Inpatient Diabetes Coordinator Pager [phone]  (8a-5p)  Addendum [phone]- Spoke with patient at bedside.  She is very sleepy.  Officer at bedside as well.  Per patient/officer she was in the jail approximately 3-4 days prior to admit.  She stated she was getting insulin but not 70/30?  Explained that she needs insulin at discharge 70/30 twice a day.  Officer said that this is not a problem and that this can be administered at jail.

## 2021-03-18 NOTE — Assessment & Plan Note (Signed)
-   Suspected due to hypovolemia on admission - Responded well to fluids

## 2021-03-18 NOTE — Hospital Course (Addendum)
[name] is a 41 yo female with PMH IDDM, HLD, HTN who presented with N/V/D.  She has been in jail since February 14.  She does have a history of gastroparesis as well. On admission it appears she may have not been having insulin as per her normal home regimen and may have developed DKA. She was started on insulin drip and fluids.  She responded well and was transitioned to subcutaneous insulins. BP also remained elevated and she was started on lisinopril with good response which was continued at discharge as well.  Insulin regimen was adjusted as reflected in med rec but may require further adjustment still at discharge.

## 2021-03-18 NOTE — Assessment & Plan Note (Addendum)
-   etiology unclear but possibly from N/V/D vs missed insulin doses recently vs combo - regardless, treated for DKA on admission

## 2021-03-18 NOTE — Progress Notes (Signed)
Pharmacy Antibiotic Note  [name] is a 41 y.o. female admitted on 03/17/2021 with hyperglycemia and diarrhea, found to be in DKA.  Patient meeting SIRS criteria on admission w/ tachycardia, WBC 22K, and lactic acid 3.8.  Pharmacy has been consulted for cefepime dosing while ruling out intra-abdominal infection.  Yesterday's weight 74.8 kg and creatinine clearance up to 52ml/min using latest creatinine of 1.03.  Plan: Change cefepime to 2 g IV every 8 hours Flagyl 500mg  IV q12 hours per MD F/u GI workup, ability to narrow antibiotics Weight: 74.8 kg (165 lb)  Temp (24hrs), Avg:98.3 F (36.8 C), Min:97.8 F (36.6 C), Max:98.7 F (37.1 C)  Recent Labs  Lab 03/17/21 0848 03/17/21 0939 03/17/21 1023 03/17/21 1656 03/17/21 2044 03/18/21 0046 03/18/21 0518  WBC 22.9*  --   --   --   --   --  17.7*  CREATININE  --  1.81*  --  1.36* 1.29* 1.10* 1.03*  LATICACIDVEN  --  3.8* 2.4*  --   --   --   --      Estimated Creatinine Clearance: 73.2 mL/min (A) (by C-G formula based on SCr of 1.03 mg/dL (H)).    Allergies  Allergen Reactions   Levonorgestrel-Ethinyl Estrad Cough    Sneezing, headaches.    Antimicrobials this admission: Cefepime 2/16 >>  Flagyl 2/16 >>   Dose adjustments this admission: 2/17 Change cefepime from q12h to q8h  Microbiology results: 2/16 UCx: ordered   Thank you for allowing pharmacy to be a part of this patients care.  3/16, PharmD, BCPS Clinical Pharmacist [place] Please utilize Amion for appropriate phone number to reach the unit pharmacist [facility]) 03/18/2021 7:21 AM

## 2021-03-18 NOTE — Assessment & Plan Note (Signed)
-   Low suspicion for infection at this time.  Likely due to DKA on presentation

## 2021-03-18 NOTE — Assessment & Plan Note (Addendum)
-   No diarrhea since admission.  C. difficile testing was ordered but since no further stools since admission will d/c order for now. -If does have recurrent diarrhea can consider retesting at that time

## 2021-03-18 NOTE — Assessment & Plan Note (Addendum)
-   No home BP meds noted; blood pressure has been elevated during hospitalization.  -Lisinopril started on 03/20/2021 due to consistently elevated pressures.  Discussed with patient about the need, she understood

## 2021-03-18 NOTE — Assessment & Plan Note (Addendum)
-   No longer on statin per med rec

## 2021-03-18 NOTE — Assessment & Plan Note (Addendum)
-   uses Humalog 70/30 and Novolog outpatient -Transitioned to NovoLog 70/30.  Adjusting as needed - Continue sliding scale and CBG monitoring

## 2021-03-18 NOTE — Plan of Care (Signed)

## 2021-03-18 NOTE — Assessment & Plan Note (Addendum)
-   Continue Reglan -See gastroparesis

## 2021-03-19 DIAGNOSIS — E1010 Type 1 diabetes mellitus with ketoacidosis without coma: Secondary | ICD-10-CM | POA: Diagnosis not present

## 2021-03-19 DIAGNOSIS — R112 Nausea with vomiting, unspecified: Secondary | ICD-10-CM

## 2021-03-19 DIAGNOSIS — E103599 Type 1 diabetes mellitus with proliferative diabetic retinopathy without macular edema, unspecified eye: Secondary | ICD-10-CM | POA: Diagnosis not present

## 2021-03-19 LAB — BASIC METABOLIC PANEL
Anion gap: 14 (ref 5–15)
BUN: 14 mg/dL (ref 6–20)
CO2: 26 mmol/L (ref 22–32)
Calcium: 9.3 mg/dL (ref 8.9–10.3)
Chloride: 100 mmol/L (ref 98–111)
Creatinine, Ser: 1.02 mg/dL — ABNORMAL HIGH (ref 0.44–1.00)
GFR, Estimated: >60 mL/min (ref >60)
Glucose, Bld: 316 mg/dL — ABNORMAL HIGH (ref 70–99)
Potassium: 3.9 mmol/L (ref 3.5–5.1)
Sodium: 140 mmol/L (ref 135–145)

## 2021-03-19 LAB — CBC WITH DIFFERENTIAL/PLATELET
Abs Immature Granulocytes: 0.05 10*3/uL (ref 0.00–0.07)
Basophils Absolute: 0 10*3/uL (ref 0.0–0.1)
Basophils Relative: 0 %
Eosinophils Absolute: 0 10*3/uL (ref 0.0–0.5)
Eosinophils Relative: 0 %
HCT: 42.7 % (ref 36.0–46.0)
Hemoglobin: 13.3 g/dL (ref 12.0–15.0)
Immature Granulocytes: 1 %
Lymphocytes Relative: 15 %
Lymphs Abs: 1.6 10*3/uL (ref 0.7–4.0)
MCH: 28.3 pg (ref 26.0–34.0)
MCHC: 31.1 g/dL (ref 30.0–36.0)
MCV: 90.9 fL (ref 80.0–100.0)
Monocytes Absolute: 0.8 10*3/uL (ref 0.1–1.0)
Monocytes Relative: 8 %
Neutro Abs: 8 10*3/uL — ABNORMAL HIGH (ref 1.7–7.7)
Neutrophils Relative %: 76 %
Platelets: 278 10*3/uL (ref 150–400)
RBC: 4.7 MIL/uL (ref 3.87–5.11)
RDW: 15.2 % (ref 11.5–15.5)
WBC: 10.5 10*3/uL (ref 4.0–10.5)
nRBC: 0 % (ref 0.0–0.2)

## 2021-03-19 LAB — GLUCOSE, CAPILLARY
Glucose-Capillary: 307 mg/dL — ABNORMAL HIGH (ref 70–99)
Glucose-Capillary: 182 mg/dL — ABNORMAL HIGH (ref 70–99)
Glucose-Capillary: 155 mg/dL — ABNORMAL HIGH (ref 70–99)
Glucose-Capillary: 91 mg/dL (ref 70–99)

## 2021-03-19 LAB — PHOSPHORUS: Phosphorus: 3.5 mg/dL (ref 2.5–4.6)

## 2021-03-19 LAB — MAGNESIUM: Magnesium: 1.9 mg/dL (ref 1.7–2.4)

## 2021-03-19 MED ORDER — INSULIN ASPART PROT & ASPART (70-30 MIX) 100 UNIT/ML ~~LOC~~ SUSP
18.0000 [IU] | Freq: Two times a day (BID) | SUBCUTANEOUS | Status: DC
  Administered 2021-03-19 – 2021-03-20 (×3): 18 [IU] via SUBCUTANEOUS
  Filled 2021-03-19: qty 10

## 2021-03-19 MED ORDER — SODIUM CHLORIDE 0.9 % IV SOLN: INTRAVENOUS | Status: DC

## 2021-03-19 NOTE — Progress Notes (Signed)
Progress Note   Patient: [name] GMW:[id number] DOB: 1980/06/16 DOA: 03/17/2021     2 DOS: the patient was seen and examined on 03/19/2021   Brief hospital course: [name] is a 41 yo female with PMH IDDM, HLD, HTN who presented with N/V/D.  She has been in jail since February 14.  She does have a history of gastroparesis as well. On admission it appears she may have not been having insulin as per her normal home regimen and may have developed DKA. She was started on insulin drip and fluids.  She responded well and was transitioned to subcutaneous insulins.  Assessment and Plan: * DKA (diabetic ketoacidosis) (HCC)-resolved as of 03/18/2021, (present on admission) - etiology unclear but possibly from N/V/D vs missed insulin doses recently vs combo - regardless, treated for DKA on admission  Type 1 diabetes mellitus with proliferative retinopathy (HCC)- (present on admission) - uses Humalog 70/30 and Novolog outpatient - contacted the jail, they do have insulins needed on hand -Transitioned to NovoLog 70/30.  Adjusting as needed - Continue sliding scale and CBG monitoring  Essential hypertension, benign- (present on admission) - No home BP meds noted; blood pressure has been elevated during hospitalization.  Patient states due to stress -Treat with as needed labetalol or hydralazine for now. -May need maintenance medication prior to discharge  Diarrhea-resolved as of 03/19/2021 - No diarrhea since admission.  C. difficile testing was ordered but since no further stools since admission will d/c order for now. -If does have recurrent diarrhea can consider retesting at that time  Intractable nausea and vomiting- (present on admission) - Continue Compazine and Zofran as needed  HYPERCHOLESTEROLEMIA- (present on admission) - Statin on hold for now  AKI (acute kidney injury) (HCC)-resolved as of 03/18/2021 - Suspected due to hypovolemia on admission - Responded well to  fluids  SIRS (systemic inflammatory response syndrome) (HCC)-resolved as of 03/18/2021 - Low suspicion for infection at this time.  Likely due to DKA on presentation      Subjective: No events overnight. Unfortunately she is still rather nauseous and still having some N/V. No diarrhea however.  Encouraged her to continue attempting eating.  We will continue fluids, adjusting her insulin, and controlling nausea as needed.  Physical Exam: Vitals:   03/18/21 2121 03/18/21 2349 03/19/21 0339 03/19/21 0627  BP: (!) 180/94 (!) 166/86 (!) 174/93 (!) 169/97  Pulse: 100 (!) 103 97 (!) 103  Resp: 16  16   Temp: 99.1 F (37.3 C)  99.5 F (37.5 C)   TempSrc: Oral  Oral   SpO2: 99%  97%   Weight:      Height:      Physical Exam Constitutional:      General: She is not in acute distress.    Comments: Fatigued appearing  HENT:     Head: Normocephalic and atraumatic.     Mouth/Throat:     Mouth: Mucous membranes are moist.  Eyes:     Extraocular Movements: Extraocular movements intact.  Cardiovascular:     Rate and Rhythm: Normal rate and regular rhythm.  Pulmonary:     Effort: Pulmonary effort is normal.     Breath sounds: Normal breath sounds.  Abdominal:     General: Bowel sounds are normal. There is no distension.     Palpations: Abdomen is soft.     Tenderness: There is no abdominal tenderness.  Musculoskeletal:        General: Normal range of motion.  Cervical back: Normal range of motion and neck supple.  Skin:    General: Skin is warm and dry.  Neurological:     General: No focal deficit present.     Mental Status: She is alert.  Psychiatric:        Mood and Affect: Mood normal.        Behavior: Behavior normal.     Data Reviewed:  Results for orders placed or performed during the hospital encounter of 03/17/21 (from the past 24 hour(s))  Glucose, capillary     Status: Abnormal   Collection Time: 03/18/21  5:04 PM  Result Value Ref Range   Glucose-Capillary  155 (H) 70 - 99 mg/dL   Comment 1 Notify RN   Glucose, capillary     Status: Abnormal   Collection Time: 03/18/21  9:27 PM  Result Value Ref Range   Glucose-Capillary 146 (H) 70 - 99 mg/dL  Basic metabolic panel     Status: Abnormal   Collection Time: 03/19/21  7:12 AM  Result Value Ref Range   Sodium 140 135 - 145 mmol/L   Potassium 3.9 3.5 - 5.1 mmol/L   Chloride 100 98 - 111 mmol/L   CO2 26 22 - 32 mmol/L   Glucose, Bld 316 (H) 70 - 99 mg/dL   BUN 14 6 - 20 mg/dL   Creatinine, Ser 1.91 (H) 0.44 - 1.00 mg/dL   Calcium 9.3 8.9 - 47.8 mg/dL   GFR, Estimated >29 >56 mL/min   Anion gap 14 5 - 15  CBC with Differential/Platelet     Status: Abnormal   Collection Time: 03/19/21  7:12 AM  Result Value Ref Range   WBC 10.5 4.0 - 10.5 K/uL   RBC 4.70 3.87 - 5.11 MIL/uL   Hemoglobin 13.3 12.0 - 15.0 g/dL   HCT 21.3 08.6 - 57.8 %   MCV 90.9 80.0 - 100.0 fL   MCH 28.3 26.0 - 34.0 pg   MCHC 31.1 30.0 - 36.0 g/dL   RDW 46.9 62.9 - 52.8 %   Platelets 278 150 - 400 K/uL   nRBC 0.0 0.0 - 0.2 %   Neutrophils Relative % 76 %   Neutro Abs 8.0 (H) 1.7 - 7.7 K/uL   Lymphocytes Relative 15 %   Lymphs Abs 1.6 0.7 - 4.0 K/uL   Monocytes Relative 8 %   Monocytes Absolute 0.8 0.1 - 1.0 K/uL   Eosinophils Relative 0 %   Eosinophils Absolute 0.0 0.0 - 0.5 K/uL   Basophils Relative 0 %   Basophils Absolute 0.0 0.0 - 0.1 K/uL   Immature Granulocytes 1 %   Abs Immature Granulocytes 0.05 0.00 - 0.07 K/uL  Magnesium     Status: None   Collection Time: 03/19/21  7:12 AM  Result Value Ref Range   Magnesium 1.9 1.7 - 2.4 mg/dL  Phosphorus     Status: None   Collection Time: 03/19/21  7:12 AM  Result Value Ref Range   Phosphorus 3.5 2.5 - 4.6 mg/dL  Glucose, capillary     Status: Abnormal   Collection Time: 03/19/21  7:34 AM  Result Value Ref Range   Glucose-Capillary 307 (H) 70 - 99 mg/dL  Glucose, capillary     Status: Abnormal   Collection Time: 03/19/21 11:42 AM  Result Value Ref Range    Glucose-Capillary 182 (H) 70 - 99 mg/dL    I have Reviewed nursing notes, Vitals, and Lab results since pt's last encounter. Pertinent lab results :  see above I have ordered test including BMP, CBC, Mg I have reviewed the last note from staff over past 24 hours I have discussed pt's care plan and test results with nursing staff, case manager  Antimicrobials: Flagyl 03/17/2021 >> 03/19/2021 Cefepime 03/17/2021 >> 03/19/2021  Consultants:   Procedures:    DVT ppx:  enoxaparin (LOVENOX) injection 40 mg Start: 03/17/21 1630     Code Status: Full Code    Family Communication:   Disposition: Status is: Inpatient Remains inpatient appropriate because: Treatment as outlined in A&P    Planned Discharge Destination:  Back to jail    Author: Lewie Chamber, MD 03/19/2021 1:51 PM  For on call review [email or web address].

## 2021-03-19 NOTE — Progress Notes (Signed)
Continuous pulse ox ordered. Connected pt to bedside pulse ox monitor.  ?

## 2021-03-19 NOTE — Progress Notes (Signed)
Pt in bed. Guard present at bedside. Pt with hand cuffs applied to each ankle. Skin CDI. Pt with active ROM in legs. Will continue to monitor.

## 2021-03-20 DIAGNOSIS — K3184 Gastroparesis: Secondary | ICD-10-CM | POA: Diagnosis not present

## 2021-03-20 DIAGNOSIS — I159 Secondary hypertension, unspecified: Secondary | ICD-10-CM

## 2021-03-20 DIAGNOSIS — N179 Acute kidney failure, unspecified: Secondary | ICD-10-CM | POA: Diagnosis not present

## 2021-03-20 DIAGNOSIS — E1110 Type 2 diabetes mellitus with ketoacidosis without coma: Secondary | ICD-10-CM | POA: Diagnosis not present

## 2021-03-20 LAB — GLUCOSE, CAPILLARY
Glucose-Capillary: 326 mg/dL — ABNORMAL HIGH (ref 70–99)
Glucose-Capillary: 267 mg/dL — ABNORMAL HIGH (ref 70–99)
Glucose-Capillary: 247 mg/dL — ABNORMAL HIGH (ref 70–99)
Glucose-Capillary: 76 mg/dL (ref 70–99)

## 2021-03-20 MED ORDER — HYOSCYAMINE SULFATE 0.125 MG SL SUBL
0.2500 mg | SUBLINGUAL_TABLET | Freq: Four times a day (QID) | SUBLINGUAL | Status: DC | PRN
  Administered 2021-03-20: 0.25 mg via SUBLINGUAL
  Filled 2021-03-20 (×2): qty 2

## 2021-03-20 MED ORDER — METOCLOPRAMIDE HCL 5 MG/ML IJ SOLN
10.0000 mg | Freq: Four times a day (QID) | INTRAMUSCULAR | Status: DC
  Administered 2021-03-20 – 2021-03-21 (×4): 10 mg via INTRAVENOUS
  Filled 2021-03-20 (×4): qty 2

## 2021-03-20 MED ORDER — ALUM & MAG HYDROXIDE-SIMETH 200-200-20 MG/5ML PO SUSP
30.0000 mL | Freq: Four times a day (QID) | ORAL | Status: DC | PRN
Start: 2021-03-20 — End: 2021-03-21

## 2021-03-20 MED ORDER — LIDOCAINE VISCOUS HCL 2 % MT SOLN
15.0000 mL | Freq: Four times a day (QID) | OROMUCOSAL | Status: DC | PRN
  Filled 2021-03-20: qty 15

## 2021-03-20 MED ORDER — LISINOPRIL 20 MG PO TABS
40.0000 mg | ORAL_TABLET | Freq: Every day | ORAL | Status: DC
  Administered 2021-03-20 – 2021-03-21 (×2): 40 mg via ORAL
  Filled 2021-03-20 (×2): qty 2

## 2021-03-20 MED ORDER — HYOSCYAMINE SULFATE 0.125 MG/ML PO SOLN: 0.2500 mg | Freq: Four times a day (QID) | ORAL | Status: DC | PRN

## 2021-03-20 MED ORDER — PANTOPRAZOLE SODIUM 40 MG PO TBEC
40.0000 mg | DELAYED_RELEASE_TABLET | Freq: Every day | ORAL | Status: DC
  Administered 2021-03-20 – 2021-03-21 (×2): 40 mg via ORAL
  Filled 2021-03-20 (×2): qty 1

## 2021-03-20 NOTE — Progress Notes (Signed)
°   03/20/21 1259  Assess: MEWS Score  Temp 98.4 F (36.9 C)  BP (!) 154/83  Pulse Rate (!) 122  Resp 17  SpO2 100 %  Assess: MEWS Score  MEWS Temp 0  MEWS Systolic 0  MEWS Pulse 2  MEWS RR 0  MEWS LOC 0  MEWS Score 2  MEWS Score Color Yellow  Assess: if the MEWS score is Yellow or Red  Were vital signs taken at a resting state? Yes  Focused Assessment No change from prior assessment  Does the patient meet 2 or more of the SIRS criteria? No  MEWS guidelines implemented *See Row Information* No, other (Comment)  Treat  MEWS Interventions Other (Comment)  Pain Scale 0-10  Pain Score 0  Take Vital Signs  Increase Vital Sign Frequency   (no)  Escalate  MEWS: Escalate  (no)  Notify: Charge Nurse/RN  Name of Charge Nurse/RN Notified Toniann Fail RN  Date Charge Nurse/RN Notified 03/20/21  Time Charge Nurse/RN Notified 1331  Notify: Provider  Provider Name/Title Dr. Frederick Peers  Date Provider Notified 03/20/21  Time Provider Notified 1331  Notification Type Page  Notification Reason Other (Comment) (update)  Provider response Other (Comment)  Date of Provider Response 03/20/21  Time of Provider Response 1526  Notify: Rapid Response  Name of Rapid Response RN Notified  (no)  Document  Patient Outcome Other (Comment) (remains on unit for observation)  Progress note created (see row info) Yes  Assess: SIRS CRITERIA  SIRS Temperature  0  SIRS Pulse 1  SIRS Respirations  0  SIRS WBC 0  SIRS Score Sum  1   Patient stable, resting in bed.

## 2021-03-20 NOTE — Progress Notes (Signed)
Progress Note   Patient: [name] UKG:[id number] DOB: 04-17-1980 DOA: 03/17/2021     3 DOS: the patient was seen and examined on 03/20/2021   Brief hospital course: [name] is a 41 yo female with PMH IDDM, HLD, HTN who presented with N/V/D.  She has been in jail since February 14.  She does have a history of gastroparesis as well. On admission it appears she may have not been having insulin as per her normal home regimen and may have developed DKA. She was started on insulin drip and fluids.  She responded well and was transitioned to subcutaneous insulins.  Assessment and Plan: * DKA (diabetic ketoacidosis) (HCC)-resolved as of 03/18/2021, (present on admission) - etiology unclear but possibly from N/V/D vs missed insulin doses recently vs combo - regardless, treated for DKA on admission  Type 1 diabetes mellitus with proliferative retinopathy (HCC)- (present on admission) - uses Humalog 70/30 and Novolog outpatient - contacted the jail, they do have insulins needed on hand -Transitioned to NovoLog 70/30.  Adjusting as needed - Continue sliding scale and CBG monitoring  Essential hypertension, benign- (present on admission) - No home BP meds noted; blood pressure has been elevated during hospitalization.  Patient states due to stress -Treat with as needed labetalol or hydralazine for now. -Lisinopril started on 03/20/2021 due to consistently elevated pressures.  Discussed with patient about the need, she understood  Diarrhea-resolved as of 03/19/2021 - No diarrhea since admission.  C. difficile testing was ordered but since no further stools since admission will d/c order for now. -If does have recurrent diarrhea can consider retesting at that time  Intractable nausea and vomiting- (present on admission) - Continue Compazine and Zofran as needed  Gastroparesis- (present on admission) - This may be contributing to her ongoing nausea and vomiting - Start on scheduled  Reglan - Encouraged her to continue small portions at meals  HYPERCHOLESTEROLEMIA- (present on admission) - Statin on hold for now  AKI (acute kidney injury) (HCC)-resolved as of 03/18/2021 - Suspected due to hypovolemia on admission - Responded well to fluids  SIRS (systemic inflammatory response syndrome) (HCC)-resolved as of 03/18/2021 - Low suspicion for infection at this time.  Likely due to DKA on presentation    Subjective: No events overnight.  Blood pressure has remained high consistently.  CBGs also still above goal and adjusting her regimen some.  [name] noted bedside this morning and encouraged her to follow diabetic diet as best as possible.  Physical Exam: Vitals:   03/20/21 0439 03/20/21 0458 03/20/21 0534 03/20/21 1259  BP: (!) 181/86 (!) 180/90 (!) 177/99 (!) 154/83  Pulse: (!) 102  (!) 109 (!) 122  Resp: 18   17  Temp: 99.1 F (37.3 C)   98.4 F (36.9 C)  TempSrc: Oral   Oral  SpO2: 99%   100%  Weight:      Height:      Physical Exam Constitutional:      General: She is not in acute distress.    Comments: Fatigued appearing  HENT:     Head: Normocephalic and atraumatic.     Mouth/Throat:     Mouth: Mucous membranes are moist.  Eyes:     Extraocular Movements: Extraocular movements intact.  Cardiovascular:     Rate and Rhythm: Normal rate and regular rhythm.  Pulmonary:     Effort: Pulmonary effort is normal.     Breath sounds: Normal breath sounds.  Abdominal:     General: Bowel  sounds are normal. There is no distension.     Palpations: Abdomen is soft.     Tenderness: There is no abdominal tenderness.  Musculoskeletal:        General: Normal range of motion.     Cervical back: Normal range of motion and neck supple.  Skin:    General: Skin is warm and dry.  Neurological:     General: No focal deficit present.     Mental Status: She is alert.  Psychiatric:        Mood and Affect: Mood normal.        Behavior: Behavior normal.      Data Reviewed:  Results for orders placed or performed during the hospital encounter of 03/17/21 (from the past 24 hour(s))  Glucose, capillary     Status: Abnormal   Collection Time: 03/19/21  3:49 PM  Result Value Ref Range   Glucose-Capillary 155 (H) 70 - 99 mg/dL  Glucose, capillary     Status: None   Collection Time: 03/19/21 10:03 PM  Result Value Ref Range   Glucose-Capillary 91 70 - 99 mg/dL  Glucose, capillary     Status: Abnormal   Collection Time: 03/20/21  7:34 AM  Result Value Ref Range   Glucose-Capillary 326 (H) 70 - 99 mg/dL  Glucose, capillary     Status: Abnormal   Collection Time: 03/20/21 12:00 PM  Result Value Ref Range   Glucose-Capillary 267 (H) 70 - 99 mg/dL    I have Reviewed nursing notes, Vitals, and Lab results since pt's last encounter. Pertinent lab results : see above I have ordered test including BMP, CBC, Mg I have reviewed the last note from staff over past 24 hours I have discussed pt's care plan and test results with nursing staff, case manager  Antimicrobials: Flagyl 03/17/2021 >> 03/19/2021 Cefepime 03/17/2021 >> 03/19/2021  Consultants:   Procedures:    DVT ppx:  enoxaparin (LOVENOX) injection 40 mg Start: 03/17/21 1630     Code Status: Full Code    Family Communication:   Disposition: Status is: Inpatient Remains inpatient appropriate because: Treatment as outlined in A&P    Planned Discharge Destination:  Back to jail    Author: Lewie Chamber, MD 03/20/2021 1:06 PM  For on call review [email or web address].

## 2021-03-20 NOTE — Progress Notes (Signed)
Notified Charge Vera and Audrea Muscat NP of Yellow MEWS. Patient is asymptomatic, labetalol administered per PRN order. Will initiate MEWS protocol.    03/20/21 1933  Assess: MEWS Score  Temp 98.2 F (36.8 C)  BP (!) 161/77  Pulse Rate (!) 114  Resp 18  Level of Consciousness Alert  SpO2 99 %  O2 Device Room Air  Assess: MEWS Score  MEWS Temp 0  MEWS Systolic 0  MEWS Pulse 2  MEWS RR 0  MEWS LOC 0  MEWS Score 2  MEWS Score Color Yellow  Assess: if the MEWS score is Yellow or Red  Were vital signs taken at a resting state? Yes  Focused Assessment No change from prior assessment  Does the patient meet 2 or more of the SIRS criteria? No  MEWS guidelines implemented *See Row Information* Yes  Treat  Pain Scale 0-10  Pain Score 0  Take Vital Signs  Increase Vital Sign Frequency  Yellow: Q 2hr X 2 then Q 4hr X 2, if remains yellow, continue Q 4hrs  Escalate  MEWS: Escalate Yellow: discuss with charge nurse/RN and consider discussing with provider and RRT  Notify: Charge Nurse/RN  Name of Charge Nurse/RN Notified vera, RN  Date Charge Nurse/RN Notified 03/20/21  Time Charge Nurse/RN Notified 1937  Notify: Provider  Provider Name/Title Audrea Muscat NP  Date Provider Notified 03/20/21  Time Provider Notified 1952  Notification Type Page  Notification Reason  (yellow MEWS)  Assess: SIRS CRITERIA  SIRS Temperature  0  SIRS Pulse 1  SIRS Respirations  0  SIRS WBC 0  SIRS Score Sum  1

## 2021-03-20 NOTE — Assessment & Plan Note (Addendum)
-   This may be contributing to her ongoing nausea and vomiting - Start on scheduled Reglan; prescription continued at discharge - Encouraged her to continue small portions at meals

## 2021-03-21 DIAGNOSIS — R197 Diarrhea, unspecified: Secondary | ICD-10-CM | POA: Diagnosis not present

## 2021-03-21 DIAGNOSIS — K3184 Gastroparesis: Secondary | ICD-10-CM | POA: Diagnosis not present

## 2021-03-21 DIAGNOSIS — N179 Acute kidney failure, unspecified: Secondary | ICD-10-CM | POA: Diagnosis not present

## 2021-03-21 DIAGNOSIS — E1010 Type 1 diabetes mellitus with ketoacidosis without coma: Secondary | ICD-10-CM | POA: Diagnosis not present

## 2021-03-21 LAB — GLUCOSE, CAPILLARY
Glucose-Capillary: 239 mg/dL — ABNORMAL HIGH (ref 70–99)
Glucose-Capillary: 380 mg/dL — ABNORMAL HIGH (ref 70–99)

## 2021-03-21 MED ORDER — INSULIN ASPART 100 UNIT/ML IJ SOLN: 0.0000 [IU] | Freq: Three times a day (TID) | INTRAMUSCULAR | 11 refills | Status: DC

## 2021-03-21 MED ORDER — LISINOPRIL 40 MG PO TABS: 40.0000 mg | ORAL_TABLET | Freq: Every day | ORAL | 3 refills | Status: DC

## 2021-03-21 MED ORDER — INSULIN NPH ISOPHANE & REGULAR (70-30) 100 UNIT/ML ~~LOC~~ SUSP: SUBCUTANEOUS | 11 refills | Status: DC

## 2021-03-21 MED ORDER — ONDANSETRON HCL 4 MG PO TABS: 4.0000 mg | ORAL_TABLET | Freq: Three times a day (TID) | ORAL | 0 refills | Status: DC | PRN

## 2021-03-21 MED ORDER — METOCLOPRAMIDE HCL 5 MG/ML IJ SOLN
10.0000 mg | Freq: Four times a day (QID) | INTRAMUSCULAR | Status: DC
  Administered 2021-03-21: 10 mg via INTRAVENOUS
  Filled 2021-03-21: qty 2

## 2021-03-21 MED ORDER — ACCU-CHEK GUIDE VI STRP: 1.0000 | ORAL_STRIP | Freq: Three times a day (TID) | 12 refills | Status: AC

## 2021-03-21 MED ORDER — PANTOPRAZOLE SODIUM 40 MG PO TBEC: 40.0000 mg | DELAYED_RELEASE_TABLET | Freq: Every day | ORAL | 3 refills | Status: DC

## 2021-03-21 MED ORDER — INSULIN ASPART PROT & ASPART (70-30 MIX) 100 UNIT/ML ~~LOC~~ SUSP
20.0000 [IU] | Freq: Every day | SUBCUTANEOUS | Status: DC
  Administered 2021-03-21: 20 [IU] via SUBCUTANEOUS
  Filled 2021-03-21: qty 10

## 2021-03-21 MED ORDER — INSULIN ASPART PROT & ASPART (70-30 MIX) 100 UNIT/ML ~~LOC~~ SUSP
18.0000 [IU] | Freq: Every day | SUBCUTANEOUS | Status: DC
  Filled 2021-03-21: qty 10

## 2021-03-21 MED ORDER — METOCLOPRAMIDE HCL 10 MG PO TABS: 10.0000 mg | ORAL_TABLET | Freq: Three times a day (TID) | ORAL | 0 refills | Status: DC

## 2021-03-21 NOTE — Discharge Summary (Signed)
Physician Discharge Summary   Patient: [name] MRN: [id number] DOB: 07/28/1980  Admit date:     03/17/2021  Discharge date: 03/21/21  Discharge Physician: Lewie Chamber   PCP: Maury Dus, MD   Recommendations at discharge:  Insulin regimen may need further adjustment due to labile glucose levels  Discharge Diagnoses: Active Problems:   Type 1 diabetes mellitus with proliferative retinopathy (HCC)   Essential hypertension, benign   HYPERCHOLESTEROLEMIA   Gastroparesis  Principal Problem (Resolved):   DKA (diabetic ketoacidosis) (HCC) Resolved Problems:   Diarrhea   Intractable nausea and vomiting   SIRS (systemic inflammatory response syndrome) (HCC)   AKI (acute kidney injury) [facility])   Hospital Course: [name] is a 41 yo female with PMH IDDM, HLD, HTN who presented with N/V/D.  She has been in jail since February 14.  She does have a history of gastroparesis as well. On admission it appears she may have not been having insulin as per her normal home regimen and may have developed DKA. She was started on insulin drip and fluids.  She responded well and was transitioned to subcutaneous insulins. BP also remained elevated and she was started on lisinopril with good response which was continued at discharge as well.  Insulin regimen was adjusted as reflected in med rec but may require further adjustment still at discharge.   Assessment and Plan: * DKA (diabetic ketoacidosis) (HCC)-resolved as of 03/18/2021, (present on admission) - etiology unclear but possibly from N/V/D vs missed insulin doses recently vs combo - regardless, treated for DKA on admission  Type 1 diabetes mellitus with proliferative retinopathy (HCC)- (present on admission) - uses Humalog 70/30 and Novolog outpatient -Transitioned to NovoLog 70/30.  Adjusting as needed - Continue sliding scale and CBG monitoring  Essential hypertension, benign- (present on admission) - No home BP meds noted;  blood pressure has been elevated during hospitalization.  -Lisinopril started on 03/20/2021 due to consistently elevated pressures.  Discussed with patient about the need, she understood  Diarrhea-resolved as of 03/19/2021 - No diarrhea since admission.  C. difficile testing was ordered but since no further stools since admission will d/c order for now. -If does have recurrent diarrhea can consider retesting at that time  Gastroparesis- (present on admission) - This may be contributing to her ongoing nausea and vomiting - Start on scheduled Reglan; prescription continued at discharge - Encouraged her to continue small portions at meals  HYPERCHOLESTEROLEMIA- (present on admission) - No longer on statin per med rec  AKI (acute kidney injury) (HCC)-resolved as of 03/18/2021 - Suspected due to hypovolemia on admission - Responded well to fluids  SIRS (systemic inflammatory response syndrome) (HCC)-resolved as of 03/18/2021 - Low suspicion for infection at this time.  Likely due to DKA on presentation  Intractable nausea and vomiting-resolved as of 03/21/2021, (present on admission) - Continue Reglan -See gastroparesis    Consultants:  Procedures performed:   Disposition:  Jail Diet recommendation:  Discharge Diet Orders (From admission, onward)     Start     Ordered   03/21/21 0000  Diet Carb Modified        03/21/21 0836           Carb modified diet  DISCHARGE MEDICATION: Allergies as of 03/21/2021       Reactions   Levonorgestrel-ethinyl Estrad Cough   Sneezing, headaches.        Medication List     STOP taking these medications    atorvastatin 40 MG tablet Commonly  known as: LIPITOR   doxycycline 100 MG tablet Commonly known as: VIBRA-TABS   ibuprofen 600 MG tablet Commonly known as: ADVIL   triamcinolone cream 0.1 % Commonly known as: KENALOG       TAKE these medications    Accu-Chek Guide test strip Generic drug: glucose blood 1 each by Other  route 4 (four) times daily -  before meals and at bedtime. What changed:  how much to take how to take this when to take this additional instructions   BD Veo Insulin Syringe U/F 31G X 15/64" 0.3 ML Misc Generic drug: Insulin Syringe-Needle U-100 USE 1  SYRINGE 4 TIMES DAILY IN THE MORNING ,AT  NOON,  IN  THE  EVENING,  AND  AT  BEDTIME   insulin aspart 100 UNIT/ML injection Commonly known as: novoLOG Inject 0-14 Units into the skin 3 (three) times daily before meals. 0-150 0 units 151-200 2 units 201-250 4 units 251-300 6 units 301-350 8 units 351-400 10 units 400+ 14 units & call provider   insulin NPH-regular Human (70-30) 100 UNIT/ML injection 20 Units in the am and 18 units in pm. What changed:  how much to take how to take this when to take this additional instructions   lisinopril 40 MG tablet Commonly known as: ZESTRIL Take 1 tablet (40 mg total) by mouth daily.   metoCLOPramide 10 MG tablet Commonly known as: REGLAN Take 1 tablet (10 mg total) by mouth 3 (three) times daily before meals for 14 days.   ondansetron 4 MG tablet Commonly known as: ZOFRAN Take 1 tablet (4 mg total) by mouth every 8 (eight) hours as needed for nausea or vomiting. What changed:  when to take this reasons to take this   pantoprazole 40 MG tablet Commonly known as: PROTONIX Take 1 tablet (40 mg total) by mouth daily.         Discharge Exam: Filed Weights   03/17/21 1300 03/17/21 1309 03/18/21 1709  Weight: 74.8 kg 74.8 kg 69.8 kg  Physical Exam Constitutional:      General: She is not in acute distress.    Comments: Fatigued appearing  HENT:     Head: Normocephalic and atraumatic.     Mouth/Throat:     Mouth: Mucous membranes are moist.  Eyes:     Extraocular Movements: Extraocular movements intact.  Cardiovascular:     Rate and Rhythm: Normal rate and regular rhythm.  Pulmonary:     Effort: Pulmonary effort is normal.     Breath sounds: Normal breath sounds.   Abdominal:     General: Bowel sounds are normal. There is no distension.     Palpations: Abdomen is soft.     Tenderness: There is no abdominal tenderness.  Musculoskeletal:        General: Normal range of motion.     Cervical back: Normal range of motion and neck supple.  Skin:    General: Skin is warm and dry.  Neurological:     General: No focal deficit present.     Mental Status: She is alert.  Psychiatric:        Mood and Affect: Mood normal.        Behavior: Behavior normal.     Condition at discharge: stable  The results of significant diagnostics from this hospitalization (including imaging, microbiology, ancillary and laboratory) are listed below for reference.   Imaging Studies: US RENAL  Result Date: 03/17/2021 CLINICAL DATA:  Acute kidney injury. EXAM: RENAL / URINARY  TRACT ULTRASOUND COMPLETE COMPARISON:  CT abdomen and pelvis 02/04/2017 FINDINGS: Right Kidney: Renal measurements: 11.3 x 4.1 x 4.7 cm = volume: 112 mL. Echogenicity within normal limits. No mass or hydronephrosis visualized. Left Kidney: Renal measurements: 8.3 x 4.5 x 3.7 cm = volume: 72 mL. Echogenicity within normal limits. No mass or hydronephrosis visualized. Bladder: Appears normal for degree of bladder distention. Other: None. IMPRESSION: Negative renal ultrasound. Electronically Signed   By: Sebastian Ache M.D.   On: 03/17/2021 16:16    Microbiology: Results for orders placed or performed during the hospital encounter of 03/17/21  Resp Panel by RT-PCR (Flu A&B, Covid) Nasopharyngeal Swab     Status: None   Collection Time: 03/17/21  8:24 AM   Specimen: Nasopharyngeal Swab; Nasopharyngeal(NP) swabs in vial transport medium  Result Value Ref Range Status   SARS Coronavirus 2 by RT PCR NEGATIVE NEGATIVE Final    Comment: (NOTE) SARS-CoV-2 target nucleic acids are NOT DETECTED.  The SARS-CoV-2 RNA is generally detectable in upper respiratory specimens during the acute phase of infection. The  lowest concentration of SARS-CoV-2 viral copies this assay can detect is 138 copies/mL. A negative result does not preclude SARS-Cov-2 infection and should not be used as the sole basis for treatment or other patient management decisions. A negative result may occur with  improper specimen collection/handling, submission of specimen other than nasopharyngeal swab, presence of viral mutation(s) within the areas targeted by this assay, and inadequate number of viral copies(<138 copies/mL). A negative result must be combined with clinical observations, patient history, and epidemiological information. The expected result is Negative.  Fact Sheet for Patients:  BloggerCourse.com  Fact Sheet for Healthcare Providers:  SeriousBroker.it  This test is no t yet approved or cleared by the [place] FDA and  has been authorized for detection and/or diagnosis of SARS-CoV-2 by FDA under an Emergency Use Authorization (EUA). This EUA will remain  in effect (meaning this test can be used) for the duration of the COVID-19 declaration under Section 564(b)(1) of the Act, 21 U.S.C.section 360bbb-3(b)(1), unless the authorization is terminated  or revoked sooner.       Influenza A by PCR NEGATIVE NEGATIVE Final   Influenza B by PCR NEGATIVE NEGATIVE Final    Comment: (NOTE) The Xpert Xpress SARS-CoV-2/FLU/RSV plus assay is intended as an aid in the diagnosis of influenza from Nasopharyngeal swab specimens and should not be used as a sole basis for treatment. Nasal washings and aspirates are unacceptable for Xpert Xpress SARS-CoV-2/FLU/RSV testing.  Fact Sheet for Patients: BloggerCourse.com  Fact Sheet for Healthcare Providers: SeriousBroker.it  This test is not yet approved or cleared by the [place] FDA and has been authorized for detection and/or diagnosis of SARS-CoV-2 by FDA under  an Emergency Use Authorization (EUA). This EUA will remain in effect (meaning this test can be used) for the duration of the COVID-19 declaration under Section 564(b)(1) of the Act, 21 U.S.C. section 360bbb-3(b)(1), unless the authorization is terminated or revoked.  Performed at [facility], [address] [place], [place] [address]   MRSA Next Gen by PCR, Nasal     Status: None   Collection Time: 03/17/21  3:38 PM   Specimen: Nasal Mucosa; Nasal Swab  Result Value Ref Range Status   MRSA by PCR Next Gen NOT DETECTED NOT DETECTED Final    Comment: (NOTE) The GeneXpert MRSA Assay (FDA approved for NASAL specimens only), is one component of a comprehensive MRSA colonization surveillance program. It  is not intended to diagnose MRSA infection nor to guide or monitor treatment for MRSA infections. Test performance is not FDA approved in patients less than 40 years old. Performed at [facility], [address], [place], [place] [address]     Labs: CBC: Recent Labs  Lab 03/17/21 0848 03/18/21 0518 03/19/21 0712  WBC 22.9* 17.7* 10.5  NEUTROABS 19.3*  --  8.0*  HGB 11.8* 10.7* 13.3  HCT 38.5 33.0* 42.7  MCV 95.1 90.7 90.9  PLT 320 270 278   Basic Metabolic Panel: Recent Labs  Lab 03/17/21 1656 03/17/21 2044 03/18/21 0046 03/18/21 0518 03/19/21 0712  NA 146* 146* 147* 148* 140  K 4.7 4.6 4.2 4.0 3.9  CL 114* 116* 115* 116* 100  CO2 23 23 24 24 26   GLUCOSE 181* 228* 178* 170* 316*  BUN 40* 35* 31* 26* 14  CREATININE 1.36* 1.29* 1.10* 1.03* 1.02*  CALCIUM 9.6 9.1 9.5 9.2 9.3  MG  --   --   --   --  1.9  PHOS  --   --   --   --  3.5   Liver Function Tests: Recent Labs  Lab 03/17/21 0939  AST 29  ALT 26  ALKPHOS 76  BILITOT 1.4*  PROT 7.5  ALBUMIN 4.0   CBG: Recent Labs  Lab 03/20/21 1200 03/20/21 1624 03/20/21 2224 03/21/21 0125 03/21/21 0800  GLUCAP 267* 247* 76 239* 380*    Discharge time spent: greater  than 30 minutes.  Signed: Lewie Chamber, MD [facility] 03/21/2021

## 2021-03-21 NOTE — Plan of Care (Signed)
°  Problem: Clinical Measurements: Goal: Cardiovascular complication will be avoided Outcome: Not Progressing   Problem: Nutrition: Goal: Adequate nutrition will be maintained Outcome: Not Progressing

## 2021-05-06 ENCOUNTER — Other Ambulatory Visit: Payer: Self-pay | Admitting: Family Medicine

## 2021-05-06 DIAGNOSIS — E103599 Type 1 diabetes mellitus with proliferative diabetic retinopathy without macular edema, unspecified eye: Secondary | ICD-10-CM

## 2021-05-09 MED ORDER — "BD VEO INSULIN SYRINGE U/F 31G X 15/64"" 0.3 ML MISC": 0 refills | Status: DC

## 2021-05-09 NOTE — Addendum Note (Signed)
Addended by: Steva Colder on: 05/09/2021 04:55 PM ? ? Modules accepted: Orders ? ?

## 2021-05-09 NOTE — Telephone Encounter (Signed)
Pharmacy calls nurse line requesting directions for syringes.  ? ?Conflicting information on the sig line.  ? ?Will forward back to PCP to resend with updated sig.  ?

## 2021-06-30 ENCOUNTER — Other Ambulatory Visit: Payer: Self-pay | Admitting: Family Medicine

## 2021-06-30 DIAGNOSIS — E103599 Type 1 diabetes mellitus with proliferative diabetic retinopathy without macular edema, unspecified eye: Secondary | ICD-10-CM

## 2021-07-05 ENCOUNTER — Encounter: Payer: Self-pay | Admitting: *Deleted

## 2021-09-09 ENCOUNTER — Other Ambulatory Visit: Payer: Self-pay | Admitting: Family Medicine

## 2021-09-09 DIAGNOSIS — E103599 Type 1 diabetes mellitus with proliferative diabetic retinopathy without macular edema, unspecified eye: Secondary | ICD-10-CM

## 2021-09-13 ENCOUNTER — Other Ambulatory Visit: Payer: Self-pay | Admitting: Family Medicine

## 2021-09-13 DIAGNOSIS — E103599 Type 1 diabetes mellitus with proliferative diabetic retinopathy without macular edema, unspecified eye: Secondary | ICD-10-CM

## 2021-10-12 ENCOUNTER — Encounter: Admitting: Family Medicine

## 2022-04-21 ENCOUNTER — Other Ambulatory Visit (HOSPITAL_COMMUNITY)
Admission: RE | Admit: 2022-04-21 | Discharge: 2022-04-21 | Disposition: A | Discharge status: Home / Self Care | Payer: Medicaid Other | Source: Ambulatory Visit | Attending: Family Medicine | Admitting: Family Medicine

## 2022-04-21 ENCOUNTER — Encounter: Payer: Self-pay | Admitting: Family Medicine

## 2022-04-21 ENCOUNTER — Other Ambulatory Visit: Payer: Self-pay

## 2022-04-21 ENCOUNTER — Ambulatory Visit (INDEPENDENT_AMBULATORY_CARE_PROVIDER_SITE_OTHER): Payer: Medicaid Other | Admitting: Family Medicine

## 2022-04-21 VITALS — BP 136/72 | HR 98 | Ht 68.0 in | Wt 156.6 lb

## 2022-04-21 DIAGNOSIS — Z720 Tobacco use: Secondary | ICD-10-CM | POA: Diagnosis not present

## 2022-04-21 DIAGNOSIS — Z113 Encounter for screening for infections with a predominantly sexual mode of transmission: Secondary | ICD-10-CM

## 2022-04-21 DIAGNOSIS — Z0000 Encounter for general adult medical examination without abnormal findings: Secondary | ICD-10-CM | POA: Diagnosis not present

## 2022-04-21 DIAGNOSIS — E103599 Type 1 diabetes mellitus with proliferative diabetic retinopathy without macular edema, unspecified eye: Secondary | ICD-10-CM

## 2022-04-21 DIAGNOSIS — I10 Essential (primary) hypertension: Secondary | ICD-10-CM

## 2022-04-21 DIAGNOSIS — N898 Other specified noninflammatory disorders of vagina: Secondary | ICD-10-CM

## 2022-04-21 LAB — POCT WET PREP (WET MOUNT)
Clue Cells Wet Prep Whiff POC: NEGATIVE
Trichomonas Wet Prep HPF POC: ABSENT

## 2022-04-21 MED ORDER — FLUCONAZOLE 150 MG PO TABS: 150.0000 mg | ORAL_TABLET | Freq: Once | ORAL | 0 refills | Status: AC

## 2022-04-21 MED ORDER — LISINOPRIL 10 MG PO TABS: 10.0000 mg | ORAL_TABLET | Freq: Every day | ORAL | 0 refills | Status: DC

## 2022-04-21 NOTE — Assessment & Plan Note (Signed)
BP mildly elevated today. Has not been taking her Lisinopril for many months now. Resume lisinopril 10mg  daily. Offered ARB instead but patient prefers to resume Lisinopril. Should be on this for renal protection anyway given her diabetes. Return in 2 weeks for lab visit to check BMP (future order placed).

## 2022-04-21 NOTE — Patient Instructions (Addendum)
It was great to see you!  Things we discussed today: -Today we did routine testing for sexually transmitted infections. I will send you a MyChart message in approximately 2 days with the results or call if they are abnormal. -We also checked for non-sexually transmitted infections including yeast and BV. -You should restart the Lisinopril. I sent a new prescription to your pharmacy for a lower dose than before. Take this once daily. -Please come back in 2 weeks for a lab visit to monitor your kidney function-- schedule this on the way out today. -Please complete the blue advanced directives packet and return it to our office at your earliest convenience. This helps ensure our care is in line with your goals/wishes. -If you decide you're ready to quit smoking and would like assistance, please let us know.  Take care, Dr Rock Nephew   Things to do to keep yourself healthy  - Exercise at least 30-45 minutes a day, 3-4 days a week.  - Eat a diet with lots of fruits and vegetables (5 servings per day). - Avoid sugar sweetened beverages and processed foods whenever possible. - Seatbelts can save your life. Wear them always.  - Safe sex - use a condom to prevent STIs. - Alcohol -  If you drink, do it moderately, less than 2 drinks per day. - Sleep - aim for 7-8 hours nightly. - Limit screen time to no more than 2 hours per day.  - [place]. Choose someone to make decisions for you if you are not able.  - Depression is common in our stressful world. If you're feeling down or losing interest in things you normally enjoy, please come in for a visit.  - Violence - If anyone is threatening or hurting you, please call immediately.

## 2022-04-21 NOTE — Assessment & Plan Note (Signed)
Smoking 0.5PPD. Not interested in cessation at this time. Will continue to encourage her to quit.

## 2022-04-21 NOTE — Progress Notes (Signed)
    SUBJECTIVE:   Chief complaint/HPI: annual examination  [name] is a 42 y.o. who presents today for an annual exam.   Current concerns: vaginal discharge and itching for 2 weeks. Took OTC yeast medication which helped briefly but then symptoms recurred. Would also like STI testing. Sexually active with 1 female partner (new partner since last time she was checked). S/p BTL for contraception.  History tabs reviewed and updated.   Review of systems form reviewed and notable for tobacco use, no advanced directives.   Type 1 Diabetes Home medications include: insulin pump OmniPod5 Follows with endocrinology at [facility]. Records reviewed-- last seen 03/20/22. A1c 8.9% at that time.   OBJECTIVE:   BP 136/72   Pulse 98   Ht 5\' 8"  (1.727 m)   Wt 156 lb 9.6 oz (71 kg)   SpO2 100%   BMI 23.81 kg/m   Gen: NAD, able to participate in exam HEENT: [place]/AT, PERRLA, nares patent bilaterally, TM normal bilaterally, oropharynx unremarkable Neck: supple, no cervical or supraclavicular lymphadenopathy, thyroid smooth and normal in size CV: RRR, normal S1/S2 Resp: Normal effort, lungs CTAB GI: abdomen soft, non-tender, non-distended Extremities: no edema or cyanosis Skin: warm and dry, no rashes noted Neuro: alert, no obvious focal deficits Psych: Normal affect and mood GU/GYN: Exam performed in the presence of a chaperone. External genitalia within normal limits.  Vaginal mucosa pink, moist, normal rugae.  Nonfriable cervix without lesions, no bleeding noted on speculum exam.  Scant white discharge noted on speculum exam.  ASSESSMENT/PLAN:   Vaginal discharge Wet prep consistent with yeast. Rx sent for diflucan 150mg  x1 with repeat dose after 72 hrs if needed. F/u results of STI testing which was obtained today.  Essential hypertension, benign BP mildly elevated today. Has not been taking her Lisinopril for many months now. Resume lisinopril 10mg  daily. Offered ARB instead but patient  prefers to resume Lisinopril. Should be on this for renal protection anyway given her diabetes. Return in 2 weeks for lab visit to check BMP (future order placed).   Tobacco abuse Smoking 0.5PPD. Not interested in cessation at this time. Will continue to encourage her to quit.   Annual Examination  See AVS for age appropriate recommendations.   PHQ score 3, reviewed and discussed.  Blood pressure reviewed and mildly elevated-- resume Lisinopril as above. Asked about intimate partner violence and resources given as appropriate  The patient currently uses BTL for contraception.   Considered the following items based upon USPSTF recommendations: Diabetes screening:  n/a. History of T1DM-- follows with endo Screening for elevated cholesterol: lipid panel checked 2/19 by endocrinologist. LDL 128- atorvastatin 40mg  daily was started at that time. HIV testing: ordered Hepatitis C: negative 09/15/21 ([facility])  Hepatitis B: negative 09/15/21 ([facility]) Syphilis if at high risk: ordered GC/CT  ordered Reviewed risk factors for osteoporosis. Using FRAX tool estimated risk of major osteoporotic fracture of  0.9%, early screening not ordered   Cervical cancer screening: prior Pap reviewed, repeat due in 2027 Breast cancer screening: discussed potential benefits, risks including overdiagnosis and biopsy, patient elected to wait until age 89. Mom has breast cancer, currently being treated. Colorectal cancer screening: not applicable given age.  if age 71 or over.   Vaccinations: declined flu/COVID today. Otherwise UTD.  Follow up in 1 year or sooner if indicated.    Alcus Dad, MD [place]

## 2022-04-21 NOTE — Assessment & Plan Note (Addendum)
Wet prep consistent with yeast. Rx sent for diflucan 150mg  x1 with repeat dose after 72 hrs if needed. F/u results of STI testing which was obtained today.

## 2022-04-22 LAB — HIV ANTIBODY (ROUTINE TESTING W REFLEX): HIV Screen 4th Generation wRfx: NONREACTIVE

## 2022-04-22 LAB — RPR: RPR Ser Ql: NONREACTIVE

## 2022-04-24 LAB — CERVICOVAGINAL ANCILLARY ONLY
Bacterial Vaginitis (gardnerella): NEGATIVE
Candida Glabrata: NEGATIVE
Candida Vaginitis: POSITIVE — AB
Chlamydia: NEGATIVE
Comment: NEGATIVE
Comment: NEGATIVE
Comment: NEGATIVE
Comment: NEGATIVE
Comment: NEGATIVE
Comment: NORMAL
Neisseria Gonorrhea: NEGATIVE
Trichomonas: NEGATIVE

## 2023-06-20 NOTE — Progress Notes (Deleted)
    SUBJECTIVE:   Chief compliant/HPI: annual examination  [name] is a 43 y.o. who presents today for an annual exam.   History tabs reviewed and updated ***.   Review of systems form reviewed and notable for ***.   OBJECTIVE:   There were no vitals taken for this visit.  ***  ASSESSMENT/PLAN:   Assessment & Plan  Annual Examination  See AVS for age appropriate recommendations.   PHQ score ***, reviewed and discussed.  Blood pressure reviewed and at goal ***.  Asked about intimate partner violence and resources given as appropriate  The patient currently uses *** for contraception. Folate recommended as appropriate, minimum of 400 mcg per day.   Considered the following items based upon USPSTF recommendations: Diabetes screening: {discussed/ordered:14545} Screening for elevated cholesterol: {discussed/ordered:14545} HIV testing: {discussed/ordered:14545} Hepatitis C: {discussed/ordered:14545} Hepatitis B: {discussed/ordered:14545} Syphilis if at high risk: {discussed/ordered:14545} GC/CT {GC/CT screening :23818} Reviewed risk factors for latent tuberculosis and {not indicated/requested/declined:14582} Reviewed risk factors for osteoporosis. Using FRAX tool estimated risk of major osteoporotic fracture of  ***%, early screening {ordered not order:23822::"not ordered"}   Discussed family history, BRCA testing {not indicated/requested/declined:14582}. Tool used to risk stratify was ***.  Cervical cancer screening: {PAPTYPE:23819} Breast cancer screening: {mammoscreen:23820} Colorectal cancer screening: {crcscreen:23821::"discussed, colonoscopy ordered"} if age 33 or over.   Follow up in 1 *** year or sooner if indicated.  MyChart Activation: {MYCHARTLIST:32522}  Sarahann Cumins, DO [facility]

## 2023-06-21 ENCOUNTER — Encounter: Admitting: Family Medicine

## 2023-06-28 NOTE — Progress Notes (Signed)
 SUBJECTIVE:   Chief compliant/HPI: annual examination  [name] is a 43 y.o. who presents today for an annual exam.  Vaginal discharge, odor x 2 weeks, noticed bump in vagina 2 weeks ago that has since resolved and intermittent vague abd pain over the last month New sexual partner in last 6 months Requesting full STD testing and mycoplasma genital testing  Previously has required Diflucan  when taking metronidazole  because it gives her yeast infection  Not taking lisinopril  regularly, frequently forgets  Requests Referral to Dr. Mason Sole (ophtho) [facility] - previous patient of his  Also has sore on bellybutton that was previously draining, has now scabbed and healed  Mom, aunt hx of breast cancer  Of note, patient lives in [place].  Has had difficulty establishing care there.  OBJECTIVE:   BP (!) 141/96   Pulse 88   Ht 5\' 8"  (1.727 m)   Wt 151 lb 6.4 oz (68.7 kg)   SpO2 100%   BMI 23.02 kg/m   General: well appearing, NAD HEENT: Tms clear bilaterally, landmarks visualized. Oropharynx clear without exudate.  Cardiovascular: RRR, no m/r/g Respiratory: normal work of breathing on RA, CTAB Extremities: No swelling BLE Pelvic - Alexis present as chaperone. VULVA: normal appearing vulva with no masses, tenderness or lesions  VAGINA: normal appearing vagina with normal color, no lesions. Thin clear discharge present CERVIX: normal appearing cervix without discharge or lesions, no CMT Abdomen: Small healed lesion to navel, no sign of infection    ASSESSMENT/PLAN:   Assessment & Plan Possible exposure to STD Full STD testing completed today.  Had BV on wet prep, sent in metronidazole  500 mg twice daily x 7 days and Diflucan  tablet to take if she develops yeast infection like she has in the past Type 1 diabetes mellitus with other specified complication (HCC) Follows with Endo, A1c 9.1 today Did not have enough time in the visit to discuss but will need to  follow-up with Endo Encounter for screening mammogram for malignant neoplasm of breast Screening mammogram ordered and provided number to call to get this scheduled Essential hypertension, benign Refilled lisinopril  10 mg, encouraged to start taking this again Follow-up appointment scheduled in 2 weeks to recheck blood pressure  Annual Examination  See AVS for age appropriate recommendations.   PHQ score 0, reviewed Blood pressure reviewed and not at goal.  Has not been taking her lisinopril .  Frequently forgets to take it.  Refilled today and strongly advised to take it daily.  Follow-up visit in 2 weeks scheduled to reassess. Asked about intimate partner violence and resources given as appropriate  S/p BTL for contraception  Considered the following items based upon USPSTF recommendations: Diabetes screening: ordered Screening for elevated cholesterol: ordered HIV testing: ordered Hepatitis C: discussed, negative ([facility] 2023) Hepatitis B: discussed negative ([facility] 2023) Syphilis if at high risk: ordered GC/CT at high risk, ordered.  Reviewed risk factors for latent tuberculosis and not indicated Reviewed risk factors for osteoporosis. Using FRAX tool estimated risk of major osteoporotic fracture of  2%, early screening not ordered   Discussed family history, BRCA testing undecided.  Cervical cancer screening: prior Pap reviewed, repeat due in 2027 Breast cancer screening: discussed potential benefits, risks including overdiagnosis and biopsy, elected proceed with mammogram Colorectal cancer screening: not applicable given age.  if age 83 or over.   Follow up in 1 year or sooner if indicated.  MyChart Activation: Already signed up  Sarahann Cumins, DO  [facility]

## 2023-06-29 ENCOUNTER — Ambulatory Visit (INDEPENDENT_AMBULATORY_CARE_PROVIDER_SITE_OTHER): Admitting: Family Medicine

## 2023-06-29 ENCOUNTER — Ambulatory Visit: Payer: Self-pay | Admitting: Family Medicine

## 2023-06-29 ENCOUNTER — Encounter: Payer: Self-pay | Admitting: Family Medicine

## 2023-06-29 VITALS — BP 141/96 | HR 88 | Ht 68.0 in | Wt 151.4 lb

## 2023-06-29 DIAGNOSIS — I10 Essential (primary) hypertension: Secondary | ICD-10-CM

## 2023-06-29 DIAGNOSIS — Z202 Contact with and (suspected) exposure to infections with a predominantly sexual mode of transmission: Secondary | ICD-10-CM | POA: Diagnosis present

## 2023-06-29 DIAGNOSIS — E1069 Type 1 diabetes mellitus with other specified complication: Secondary | ICD-10-CM

## 2023-06-29 DIAGNOSIS — Z1231 Encounter for screening mammogram for malignant neoplasm of breast: Secondary | ICD-10-CM

## 2023-06-29 LAB — POCT GLYCOSYLATED HEMOGLOBIN (HGB A1C): HbA1c, POC (controlled diabetic range): 9.1 % — AB (ref 0.0–7.0)

## 2023-06-29 LAB — POCT WET PREP (WET MOUNT)
Clue Cells Wet Prep Whiff POC: POSITIVE
Trichomonas Wet Prep HPF POC: ABSENT
WBC, Wet Prep HPF POC: NONE SEEN

## 2023-06-29 MED ORDER — METRONIDAZOLE 500 MG PO TABS: 500.0000 mg | ORAL_TABLET | Freq: Two times a day (BID) | ORAL | 0 refills | Status: AC

## 2023-06-29 MED ORDER — FLUCONAZOLE 150 MG PO TABS: 150.0000 mg | ORAL_TABLET | Freq: Once | ORAL | 0 refills | Status: AC

## 2023-06-29 MED ORDER — LISINOPRIL 10 MG PO TABS: 10.0000 mg | ORAL_TABLET | Freq: Every day | ORAL | 0 refills | Status: DC

## 2023-06-29 NOTE — Patient Instructions (Addendum)
 Good to see you today - Thank you for coming in  Things we discussed today: We have tested for STDs today.  I will notify you via MyChart when these result and call you if they are abnormal.  You do have bacterial vaginosis.  I have sent in metronidazole  to treat this.  I sent in a tablet of Diflucan  due to the metronidazole  that you can take if you develop a yeast infection. We also obtained your A1c, BMP, and checked your cholesterol.  I will let you know when these result. Please follow up in 2 weeks to discuss your blood pressure. If you develop chest pain, shortness of breath go to the ER. Call us  if needed.  I have provided you with a number to call to get your mammogram scheduled, it is ordered. Please start taking your blood pressure medicine again.    Things to do to keep yourself healthy  - Exercise at least 30-45 minutes a day, 3-4 days a week.  - Eat a diet with lots of fruits and vegetables (5 servings per day). - Avoid sugar sweetened beverages and processed foods whenever possible. - Seatbelts can save your life. Wear them always.  - Safe sex - use a condom to prevent STIs. - Alcohol -  If you drink, do it moderately, less than 2 drinks per day. - Sleep - aim for 7-8 hours nightly. - Limit screen time to no more than 2 hours per day.  - Health Care Power of Attorney. Choose someone to make decisions for you if you are not able.  - Depression is common in our stressful world. If you're feeling down or losing interest in things you normally enjoy, please come in for a visit.  - Violence - If anyone is threatening or hurting you, please call immediately.

## 2023-06-29 NOTE — Assessment & Plan Note (Signed)
 Refilled lisinopril  10 mg, encouraged to start taking this again Follow-up appointment scheduled in 2 weeks to recheck blood pressure

## 2023-06-30 LAB — BASIC METABOLIC PANEL WITH GFR
BUN/Creatinine Ratio: 12 (ref 9–23)
BUN: 12 mg/dL (ref 6–24)
CO2: 23 mmol/L (ref 20–29)
Calcium: 9.7 mg/dL (ref 8.7–10.2)
Chloride: 101 mmol/L (ref 96–106)
Creatinine, Ser: 0.98 mg/dL (ref 0.57–1.00)
Glucose: 241 mg/dL — ABNORMAL HIGH (ref 70–99)
Potassium: 5 mmol/L (ref 3.5–5.2)
Sodium: 137 mmol/L (ref 134–144)
eGFR: 74 mL/min/{1.73_m2} (ref >59)

## 2023-06-30 LAB — LIPID PANEL
Chol/HDL Ratio: 3.6 ratio (ref 0.0–4.4)
Cholesterol, Total: 224 mg/dL — ABNORMAL HIGH (ref 100–199)
HDL: 63 mg/dL (ref >39)
LDL Chol Calc (NIH): 149 mg/dL — ABNORMAL HIGH (ref 0–99)
Triglycerides: 70 mg/dL (ref 0–149)
VLDL Cholesterol Cal: 12 mg/dL (ref 5–40)

## 2023-06-30 LAB — RPR: RPR Ser Ql: NONREACTIVE

## 2023-06-30 LAB — HIV ANTIBODY (ROUTINE TESTING W REFLEX): HIV Screen 4th Generation wRfx: NONREACTIVE

## 2023-07-01 LAB — CT, NG, M GENITALIUM NAA, SWAB
Chlamydia trachomatis, NAA: NEGATIVE
Mycoplasma genitalium NAA: NEGATIVE
Neisseria gonorrhoeae, NAA: NEGATIVE

## 2023-07-02 MED ORDER — ATORVASTATIN CALCIUM 40 MG PO TABS: 40.0000 mg | ORAL_TABLET | Freq: Every day | ORAL | 3 refills | Status: AC

## 2023-07-13 ENCOUNTER — Encounter: Payer: Self-pay | Admitting: Family Medicine

## 2023-07-13 ENCOUNTER — Telehealth: Payer: Self-pay | Admitting: Family Medicine

## 2023-07-13 ENCOUNTER — Ambulatory Visit

## 2023-07-13 ENCOUNTER — Ambulatory Visit (INDEPENDENT_AMBULATORY_CARE_PROVIDER_SITE_OTHER): Admitting: Family Medicine

## 2023-07-13 VITALS — BP 110/71 | HR 81 | Ht 68.0 in | Wt 149.8 lb

## 2023-07-13 DIAGNOSIS — Z803 Family history of malignant neoplasm of breast: Secondary | ICD-10-CM | POA: Insufficient documentation

## 2023-07-13 DIAGNOSIS — R109 Unspecified abdominal pain: Secondary | ICD-10-CM | POA: Diagnosis present

## 2023-07-13 DIAGNOSIS — Z23 Encounter for immunization: Secondary | ICD-10-CM | POA: Diagnosis not present

## 2023-07-13 DIAGNOSIS — E1069 Type 1 diabetes mellitus with other specified complication: Secondary | ICD-10-CM | POA: Diagnosis not present

## 2023-07-13 DIAGNOSIS — I10 Essential (primary) hypertension: Secondary | ICD-10-CM | POA: Diagnosis not present

## 2023-07-13 NOTE — Patient Instructions (Signed)
 Abdominal Pain, Adult    Many things can cause belly (abdominal) pain. In most cases, belly pain is not a serious problem and can be watched and treated at home. But in some cases, it can be serious.  Your doctor will try to find the cause of your belly pain.  Follow these instructions at home:  Medicines  Take over-the-counter and prescription medicines only as told by your doctor.  Do not take medicines that help you poop (laxatives) unless told by your doctor.  General instructions  Watch your belly pain for any changes. Tell your doctor if the pain gets worse.  Drink enough fluid to keep your pee (urine) pale yellow.  Contact a doctor if:  Your belly pain changes or gets worse.  You have very bad cramping or bloating in your belly.  You vomit.  Your pain gets worse with meals, after eating, or with certain foods.  You have trouble pooping or have watery poop for more than 2-3 days.  You are not hungry, or you lose weight without trying.  You have signs of not getting enough fluid or water (dehydration). These may include:  Dark pee, very Chastain pee, or no pee.  Cracked lips or dry mouth.  Feeling sleepy or weak.  You have pain when you pee or poop.  Your belly pain wakes you up at night.  You have blood in your pee.  You have a fever.  Get help right away if:  You cannot stop vomiting.  Your pain is only in one part of your belly, like on the right side.  You have bloody or black poop, or poop that looks like tar.  You have trouble breathing.  You have chest pain.  These symptoms may be an emergency. Get help right away. Call 911.  Do not wait to see if the symptoms will go away.  Do not drive yourself to the hospital.  This information is not intended to replace advice given to you by your health care provider. Make sure you discuss any questions you have with your health care provider.  Document Revised: 11/02/2021 Document Reviewed: 11/02/2021  Elsevier Patient Education  2024 [employer].

## 2023-07-13 NOTE — Assessment & Plan Note (Addendum)
 She was scheduled for mammogram today, but had to reschedule PCP to follow up on this Discussed referral to genetics for counseling and testing She agreed with the plan Referral order placed   Lab from previous weeks reviewed and discussed with her PCP already addressed BV F/U with PCP for further management as needed

## 2023-07-13 NOTE — Assessment & Plan Note (Addendum)
 Management per PCP and endocrinology.

## 2023-07-13 NOTE — Telephone Encounter (Signed)
 CT scan not covered/approved by insurance discussed with her ED precautions discussed She agreed with the plan

## 2023-07-13 NOTE — Assessment & Plan Note (Addendum)
 Chronic Discussed possibility of appendicitis given periumbilical pain and RLQ pain Less likely given chronicity Previous CT scan reviewed which showed colitis Given hx, repeat CT scan discussed Unfortunately, this was not approved by her insurance ED precautions discussed Consider GI referral if no improvement H -pylori testing, Cmet and Lipase completed today Will contact soon with test results Pepcid  escribed

## 2023-07-13 NOTE — Progress Notes (Signed)
    SUBJECTIVE:   CHIEF COMPLAINT / HPI:   HTN/DM2: Compliant with meds. On insulin  pump. No new concerns.   Stomach pain: C/O epigastric and periumbilical pain ongoing for 3 months. Pain waxes and wanes. In the past week, she vomited twice. No blood in her urine. Currently, pain is mild, about 2/10 in severity. It feels like a burning pain in her stomach that feels like her previous episode of pancreatitis.  Breast Ca:  Family hx of breast cancer in her mother at age 71. Maternal aunt died of colon cancer. Worried about cancer risk.  Lab follow: Wanted to review last lab results.  PERTINENT  PMH / PSH: PMHx reviewed  OBJECTIVE:   BP 110/71   Pulse 81   Ht 5' 8 (1.727 m)   Wt 149 lb 12.8 oz (67.9 kg)   LMP  (LMP Unknown)   SpO2 100%   BMI 22.78 kg/m   Physical Exam Vitals and nursing note reviewed.  Constitutional:      General: She is not in acute distress.    Appearance: Normal appearance. She is not ill-appearing, toxic-appearing or diaphoretic.   Cardiovascular:     Rate and Rhythm: Normal rate and regular rhythm.     Pulses: Normal pulses.     Heart sounds: Normal heart sounds. No murmur heard. Pulmonary:     Effort: Pulmonary effort is normal. No respiratory distress.     Breath sounds: Normal breath sounds. No stridor. No wheezing.  Abdominal:     General: There is no distension.     Palpations: Abdomen is soft. There is no mass.     Comments: Mild periumbilical and RLQ abdominal pain   Musculoskeletal:     Right lower leg: No edema.     Left lower leg: No edema.      ASSESSMENT/PLAN:   Assessment & Plan Abdominal pain, unspecified abdominal location Chronic Discussed possibility of appendicitis given periumbilical pain and RLQ pain Less likely given chronicity Previous CT scan reviewed which showed colitis Given hx, repeat CT scan discussed Unfortunately, this was not approved by her insurance ED precautions discussed Consider GI referral if  no improvement H -pylori testing, Cmet and Lipase completed today Will contact soon with test results Pepcid  escribed Essential hypertension, benign BP looks great PCP follow up recommended Type 1 diabetes mellitus with other specified complication (HCC) Management per PCP and endocrinology Family history of breast cancer She was scheduled for mammogram today, but had to reschedule PCP to follow up on this Discussed referral to genetics for counseling and testing She agreed with the plan Referral order placed   Lab from previous weeks reviewed and discussed with her PCP already addressed BV F/U with PCP for further management as needed   HPV & Tdap given today  Penni Bowman, MD [facility]

## 2023-07-13 NOTE — Assessment & Plan Note (Addendum)
 BP looks great PCP follow up recommended

## 2023-07-14 MED ORDER — FAMOTIDINE 20 MG PO TABS: 20.0000 mg | ORAL_TABLET | Freq: Every day | ORAL | 0 refills | Status: AC

## 2023-07-14 NOTE — Addendum Note (Signed)
 Addended by: Penni Bowman T on: 07/14/2023 05:49 AM   Modules accepted: Orders

## 2023-07-16 ENCOUNTER — Telehealth: Payer: Self-pay | Admitting: Family Medicine

## 2023-07-16 NOTE — Telephone Encounter (Signed)
 Unfortunately, her blood samples collected for testing last week were not sent to a lab and will be wasted. I called to advise her, and she was appropriately upset. I offered an early lab appointment/PCP appointment this week or the next, which she declined since she comes all the way from [place]. She prefers to come in after July 13th. Appointment made with PCP. She hung up the phone before I could say anything else. If still having GI symptoms, PCP to consider ordering - Cmet, Lipase, H Pylori. She will return soon or go to the ED if symptoms worsen.

## 2023-07-18 LAB — CMP14+EGFR

## 2023-07-18 LAB — LIPASE

## 2023-07-18 LAB — H. PYLORI BREATH TEST

## 2023-07-26 NOTE — Progress Notes (Signed)
 I examined the patient with the resident and agree with the plan above.   No significant retinopathy. 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 Plan for FA if no resolution at follow up. No cell to imply inflammatory etiology

## 2023-07-28 ENCOUNTER — Other Ambulatory Visit: Payer: Self-pay | Admitting: Family Medicine

## 2023-07-30 ENCOUNTER — Telehealth: Payer: Self-pay | Admitting: Family Medicine

## 2023-07-30 NOTE — Telephone Encounter (Signed)
 CT needing prior peer to peer review  I called the number provided and spoke with [facility] - customer advocate specialist.  She requested for the procedure code or authorization number to initiate the review. Unfortunately I don't have any of these numbers.   Margit, can you provide me with the procedure code or authorization number?

## 2023-07-30 NOTE — Telephone Encounter (Signed)
-----   Message from [facility] Reena S sent at 07/30/2023  4:33 PM EDT ----- Hi Dr Anders,  The CT you ordered for this patient was not approved.  If you would like to do a peer to peer please call the following number and use the information given.  [phone] [name] DOB 1980-07-10 [place] MEDICAID PREPAID HEALTH PLAN/ MEDICAID [place] COMPLETE HEALTH Member ID: [id number] C  Please let me know if pt you get this approved and the valid dates.  Thank you! Margit

## 2023-07-31 NOTE — Telephone Encounter (Signed)
 Please advise patient prior shara was conducted today and denied for CT abdomen. Advise ED precaution or see us  sooner than July 18th if symptoms worsen. Thanks.

## 2023-07-31 NOTE — Telephone Encounter (Signed)
 I called. Prior Auth denies and I was told no need for peer to peer at this point. Blue team CMA, please contact patient to see if she need an early appointment with PCP for reassessment as her CT abdomen imaging was denied. Thanks.

## 2023-07-31 NOTE — Telephone Encounter (Signed)
 Patient will be coming in on 07/18 seeing PCP.  No earlier appointment with PCP wasn't available.

## 2023-08-17 ENCOUNTER — Encounter: Payer: Self-pay | Admitting: Family Medicine

## 2023-08-17 ENCOUNTER — Ambulatory Visit: Payer: Self-pay | Admitting: Family Medicine

## 2023-08-17 VITALS — BP 104/58 | HR 74 | Ht 68.0 in | Wt 151.1 lb

## 2023-08-17 DIAGNOSIS — M546 Pain in thoracic spine: Secondary | ICD-10-CM | POA: Diagnosis present

## 2023-08-17 DIAGNOSIS — Z23 Encounter for immunization: Secondary | ICD-10-CM

## 2023-08-17 NOTE — Progress Notes (Signed)
    SUBJECTIVE:   CHIEF COMPLAINT / HPI:   Follow up: Seen 6/13 for abdominal pain - epigastric and periumbilical pain CTAP ordered but not approved Attempted to obtain H pylori, Cmet, and lipase testing but bloodwork was not sent to lab Fortunately abdominal pain has resolved  C/o upper back pain x3 weeks No injury. Non radiating No neurological sx such as numbness or tingling in the arms or legs Has been taking ibuprofen  as needed with improvement Sometimes worse in the morning  Works at [employer], does not do heavy lifting  PERTINENT  PMH / PSH: HTN, gastroparesis, mild nonproliferative diabetic retinopathy, T2DM, HLD  OBJECTIVE:   BP (!) 104/58   Pulse 74   Ht 5' 8 (1.727 m)   Wt 151 lb 2 oz (68.5 kg)   LMP 07/16/2023   SpO2 99%   BMI 22.98 kg/m   General: well appearing, NAD Cardiovascular: RRR, no m/r/g Respiratory: normal work of breathing on RA, CTAB Back: Midline TTP over T6-T7. No step off or deformity palpated. No paraspinal tenderness. Full ROM of BUE. 5/5 strength BUE. Ambulates independently without difficulty. No overlying skin changes.   ASSESSMENT/PLAN:   Assessment & Plan Acute midline thoracic back pain Could be pain radiating from muscle strain vs facet dysfunction vs compression fracture (less likely) vs disc herniation (less likely) reassuringly no neurological sx and no red flag sx. Afebrile, do not suspect abscess. Considered ACS but do not suspect this as she is not having chest pain or shortness of breath and has been going on for 3 weeks  Will obtain thoracic spine XR Return precautions discussed  Need for vaccination 2nd HPV vaccine given today    Latera Mclin, DO [facility]

## 2023-08-17 NOTE — Patient Instructions (Signed)
 Good to see you today - Thank you for coming in  Things we discussed today: I have ordered an xray of your back. You do not need an appointment for this. Please go to [address] to have this completed   Please always bring your medication bottles  Come back to see me in 6 months

## 2023-09-20 ENCOUNTER — Ambulatory Visit

## 2023-09-26 ENCOUNTER — Other Ambulatory Visit: Payer: Self-pay | Admitting: Family Medicine

## 2024-02-18 ENCOUNTER — Ambulatory Visit

## 2024-02-24 ENCOUNTER — Other Ambulatory Visit: Payer: Self-pay | Admitting: Family Medicine
